# Patient Record
Sex: Female | Born: 1945 | Race: White | Hispanic: No | Smoking: Former smoker
Health system: Southern US, Community
[De-identification: ages and names within clinical notes are randomized; demographics above are authoritative.]

## PROBLEM LIST (undated history)

## (undated) DIAGNOSIS — G4730 Sleep apnea, unspecified: Secondary | ICD-10-CM

## (undated) DIAGNOSIS — F410 Panic disorder [episodic paroxysmal anxiety] without agoraphobia: Secondary | ICD-10-CM

## (undated) DIAGNOSIS — J449 Chronic obstructive pulmonary disease, unspecified: Secondary | ICD-10-CM

## (undated) DIAGNOSIS — R0602 Shortness of breath: Secondary | ICD-10-CM

## (undated) DIAGNOSIS — Z7189 Other specified counseling: Secondary | ICD-10-CM

## (undated) DIAGNOSIS — C801 Malignant (primary) neoplasm, unspecified: Secondary | ICD-10-CM

## (undated) DIAGNOSIS — D509 Iron deficiency anemia, unspecified: Secondary | ICD-10-CM

## (undated) DIAGNOSIS — J439 Emphysema, unspecified: Secondary | ICD-10-CM

## (undated) DIAGNOSIS — J189 Pneumonia, unspecified organism: Secondary | ICD-10-CM

## (undated) DIAGNOSIS — I10 Essential (primary) hypertension: Secondary | ICD-10-CM

## (undated) HISTORY — DX: Essential (primary) hypertension: I10

## (undated) HISTORY — DX: Shortness of breath: R06.02

## (undated) HISTORY — PX: CATARACT EXTRACTION: SUR2

## (undated) HISTORY — DX: Pneumonia, unspecified organism: J18.9

## (undated) HISTORY — DX: Other specified counseling: Z71.89

## (undated) HISTORY — PX: WISDOM TOOTH EXTRACTION: SHX21

## (undated) HISTORY — DX: Iron deficiency anemia, unspecified: D50.9

---

## 2002-01-29 ENCOUNTER — Other Ambulatory Visit: Admission: RE | Admit: 2002-01-29 | Discharge: 2002-01-29 | Payer: Self-pay | Admitting: Obstetrics and Gynecology

## 2002-03-11 ENCOUNTER — Encounter: Payer: Self-pay | Admitting: Family Medicine

## 2002-03-11 ENCOUNTER — Encounter: Admission: RE | Admit: 2002-03-11 | Discharge: 2002-03-11 | Payer: Self-pay | Admitting: Family Medicine

## 2004-04-28 ENCOUNTER — Emergency Department (HOSPITAL_COMMUNITY): Admission: EM | Admit: 2004-04-28 | Discharge: 2004-04-28 | Payer: Self-pay | Admitting: Emergency Medicine

## 2010-12-12 ENCOUNTER — Emergency Department (HOSPITAL_COMMUNITY)
Admission: EM | Admit: 2010-12-12 | Discharge: 2010-12-12 | Disposition: A | Discharge status: Home / Self Care | Payer: BC Managed Care – PPO | Attending: Emergency Medicine | Admitting: Emergency Medicine

## 2010-12-12 DIAGNOSIS — S40269A Insect bite (nonvenomous) of unspecified shoulder, initial encounter: Secondary | ICD-10-CM | POA: Insufficient documentation

## 2010-12-12 DIAGNOSIS — I10 Essential (primary) hypertension: Secondary | ICD-10-CM | POA: Insufficient documentation

## 2010-12-12 DIAGNOSIS — W57XXXA Bitten or stung by nonvenomous insect and other nonvenomous arthropods, initial encounter: Secondary | ICD-10-CM | POA: Insufficient documentation

## 2010-12-12 DIAGNOSIS — F411 Generalized anxiety disorder: Secondary | ICD-10-CM | POA: Insufficient documentation

## 2011-10-15 ENCOUNTER — Other Ambulatory Visit (HOSPITAL_COMMUNITY): Payer: Self-pay | Admitting: Obstetrics and Gynecology

## 2011-10-15 DIAGNOSIS — Z1231 Encounter for screening mammogram for malignant neoplasm of breast: Secondary | ICD-10-CM

## 2011-11-12 ENCOUNTER — Ambulatory Visit (HOSPITAL_COMMUNITY)
Admission: RE | Admit: 2011-11-12 | Discharge: 2011-11-12 | Disposition: A | Discharge status: Home / Self Care | Payer: BC Managed Care – PPO | Source: Ambulatory Visit | Attending: Obstetrics and Gynecology | Admitting: Obstetrics and Gynecology

## 2011-11-12 DIAGNOSIS — Z1231 Encounter for screening mammogram for malignant neoplasm of breast: Secondary | ICD-10-CM | POA: Insufficient documentation

## 2012-04-07 ENCOUNTER — Encounter: Payer: Self-pay | Admitting: Internal Medicine

## 2012-04-07 ENCOUNTER — Ambulatory Visit (INDEPENDENT_AMBULATORY_CARE_PROVIDER_SITE_OTHER): Payer: BC Managed Care – PPO | Admitting: Internal Medicine

## 2012-04-07 VITALS — BP 130/80 | HR 85 | Temp 98.5°F | Ht 67.0 in | Wt 227.2 lb

## 2012-04-07 DIAGNOSIS — R0600 Dyspnea, unspecified: Secondary | ICD-10-CM

## 2012-04-07 DIAGNOSIS — R0989 Other specified symptoms and signs involving the circulatory and respiratory systems: Secondary | ICD-10-CM

## 2012-04-07 NOTE — Progress Notes (Signed)
Subjective:    Patient ID: Alisha Fisher, female    DOB: 1946-04-08, 66 y.o.   MRN: [id number]  HPI  PCP is [facility]. Body mass index is 35.58 kg/(m^2).  reports that she has been smoking Cigarettes.  She has a 20 pack-year smoking history. She does not have any smokeless tobacco history on file.  IOV 04/07/2012  66 year old female. Reports dyspnea. Insidious onset for over few years. PRogressive a year ago but improved recently but still not at baseline. Severity is moderate-severe. Not present at rest except during URI episodes. Worsened by exertion such as walking inside the house. Improved by advair since past month  Associated social stress + with daughter in law ill and she taking care of grand-twins. THere is associated mild cough or wheezing that could be made worse by pollen, dust, allergies. There is also episodic dyspnea that can happen at random while going to the car or toilet and she will need to cool her face over the fan. She wondered if this was anxiety but did not improve with anxity pill. However , she does have sense of doom during this time and palms get sweaty. No episode since advair past month.    Note: 50# weight gain x 12 months  Walking desaturation test 12/19/2011 185 feet x 2 laps: could not do more due to dyspnea., Lowest pulse ox 88%    Pmhx    - significant for pneumonia FEb 2013 - per hx right lung. REfused admit but Rx as opd. REports fu cxxr shpwed pna cleared up  LABS  CXR July 2005    - clear lung fields  Past Medical History  Diagnosis Date  . HTN (hypertension)   . Pneumonia   . SOB (shortness of breath)      Family History  Problem Relation Age of Onset  . Allergies    . COPD Cousin      History   Social History  . Marital Status: Widowed    Spouse Name: N/A    Number of Children: N/A  . Years of Education: N/A   Occupational History  . retired    Social History Main Topics  . Smoking status: Current Everyday  Smoker -- 0.5 packs/day for 40 years    Types: Cigarettes  . Smokeless tobacco: Not on file  . Alcohol Use: 0.5 oz/week    1 drink(s) per week  . Drug Use: No  . Sexually Active: Not on file   Other Topics Concern  . Not on file   Social History Narrative  . No narrative on file     No Known Allergies   No outpatient prescriptions prior to visit.        Review of Systems  Constitutional: Negative for fever and unexpected weight change.  HENT: Negative for ear pain, nosebleeds, congestion, sore throat, rhinorrhea, sneezing, trouble swallowing, dental problem, postnasal drip and sinus pressure.   Eyes: Negative for redness and itching.  Respiratory: Positive for shortness of breath. Negative for cough, chest tightness and wheezing.   Cardiovascular: Negative for palpitations and leg swelling.  Gastrointestinal: Negative for nausea and vomiting.  Genitourinary: Negative for dysuria.  Musculoskeletal: Negative for joint swelling.  Skin: Negative for rash.  Neurological: Positive for headaches.  Hematological: Does not bruise/bleed easily.  Psychiatric/Behavioral: Positive for dysphoric mood. The patient is not nervous/anxious.        Objective:   Physical Exam  Vitals reviewed. Constitutional: She is oriented to person,  place, and time. She appears well-developed and well-nourished. No distress.       Body mass index is 35.58 kg/(m^2).   HENT:  Head: Normocephalic and atraumatic.  Right Ear: External ear normal.  Left Ear: External ear normal.  Mouth/Throat: Oropharynx is clear and moist. No oropharyngeal exudate.  Eyes: Conjunctivae and EOM are normal. Pupils are equal, round, and reactive to light. Right eye exhibits no discharge. Left eye exhibits no discharge. No scleral icterus.  Neck: Normal range of motion. Neck supple. No JVD present. No tracheal deviation present. No thyromegaly present.  Cardiovascular: Normal rate, regular rhythm, normal heart sounds and  intact distal pulses.  Exam reveals no gallop and no friction rub.   No murmur heard. Pulmonary/Chest: Effort normal. No respiratory distress. She has wheezes. She has no rales. She exhibits no tenderness.       ? Forced expiratory wheeze +  Abdominal: Soft. Bowel sounds are normal. She exhibits no distension and no mass. There is no tenderness. There is no rebound and no guarding.  Musculoskeletal: Normal range of motion. She exhibits no edema and no tenderness.  Lymphadenopathy:    She has no cervical adenopathy.  Neurological: She is alert and oriented to person, place, and time. She has normal reflexes. No cranial nerve deficit. She exhibits normal muscle tone. Coordination normal.  Skin: Skin is warm and dry. No rash noted. She is not diaphoretic. No erythema. No pallor.  Psychiatric: She has a normal mood and affect. Her behavior is normal. Judgment and thought content normal.          Assessment & Plan:

## 2012-04-07 NOTE — Patient Instructions (Addendum)
Please have breathing test called PFT at [place] office Will set up overnight oxygen study to look at oxygen levels more carefully and if potential problem heree is related to shortness of breath I will look at result and call you with next step of advice

## 2012-04-11 ENCOUNTER — Encounter: Payer: Self-pay | Admitting: Internal Medicine

## 2012-04-11 DIAGNOSIS — R0600 Dyspnea, unspecified: Secondary | ICD-10-CM | POA: Insufficient documentation

## 2012-04-11 NOTE — Assessment & Plan Note (Signed)
Unclear cause of dyspnea  Plan Please have breathing test called PFT at [place] office Will set up overnight oxygen study to look at oxygen levels more carefully and if potential problem heree is related to shortness of breath I will look at result and call you with next step of advice

## 2012-04-22 ENCOUNTER — Ambulatory Visit (INDEPENDENT_AMBULATORY_CARE_PROVIDER_SITE_OTHER): Payer: BC Managed Care – PPO | Admitting: Internal Medicine

## 2012-04-22 DIAGNOSIS — R0600 Dyspnea, unspecified: Secondary | ICD-10-CM

## 2012-04-22 DIAGNOSIS — R0989 Other specified symptoms and signs involving the circulatory and respiratory systems: Secondary | ICD-10-CM

## 2012-04-22 LAB — PULMONARY FUNCTION TEST

## 2012-04-22 NOTE — Progress Notes (Signed)
PFT done today. 

## 2012-05-06 ENCOUNTER — Telehealth: Payer: Self-pay | Admitting: Internal Medicine

## 2012-05-06 DIAGNOSIS — R942 Abnormal results of pulmonary function studies: Secondary | ICD-10-CM

## 2012-05-06 DIAGNOSIS — R0600 Dyspnea, unspecified: Secondary | ICD-10-CM

## 2012-05-06 DIAGNOSIS — R0902 Hypoxemia: Secondary | ICD-10-CM

## 2012-05-06 NOTE — Telephone Encounter (Signed)
RAMASWAMY,MURALI, MD 05/06/2012 1:53 AM Signed  PFT 04/22/12 show mixed obstruction and restriction. I have ordered CT chest wo contrast. Fu depending on those results. Please let her know  Thanks  MR   I spoke with patient about results and she verbalized understanding and had no questions

## 2012-05-06 NOTE — Telephone Encounter (Signed)
Pt aware. Jennea Rager, CMA  

## 2012-05-06 NOTE — Telephone Encounter (Signed)
PFT 04/22/12 show mixed obstruction and restriction. I have ordered CT chest wo contrast. Fu depending on those results. Please let her know   Thanks MR  For my use  - fev1 1.1L/47%, rato 60, 8% BD response - c/w obstrn -  TLC 4L/75% - cw restriction  DLCO 16/63%

## 2012-05-08 ENCOUNTER — Encounter: Payer: Self-pay | Admitting: Internal Medicine

## 2012-05-08 ENCOUNTER — Telehealth: Payer: Self-pay | Admitting: Internal Medicine

## 2012-05-08 ENCOUNTER — Ambulatory Visit (INDEPENDENT_AMBULATORY_CARE_PROVIDER_SITE_OTHER)
Admission: RE | Admit: 2012-05-08 | Discharge: 2012-05-08 | Disposition: A | Discharge status: Home / Self Care | Payer: BC Managed Care – PPO | Source: Ambulatory Visit | Attending: Internal Medicine | Admitting: Internal Medicine

## 2012-05-08 DIAGNOSIS — R0902 Hypoxemia: Secondary | ICD-10-CM

## 2012-05-08 DIAGNOSIS — R942 Abnormal results of pulmonary function studies: Secondary | ICD-10-CM

## 2012-05-08 DIAGNOSIS — R0609 Other forms of dyspnea: Secondary | ICD-10-CM

## 2012-05-08 DIAGNOSIS — R0600 Dyspnea, unspecified: Secondary | ICD-10-CM

## 2012-05-08 NOTE — Telephone Encounter (Signed)
Jen  CT shows emphysema as cause of dyspnea. Pleaes have her come in to discuss. PRefer I see her next few weeks  MR

## 2012-05-09 NOTE — Telephone Encounter (Signed)
[id number]. Can offer the pt appt on Friday 05-23-12 at 1:30pm, ok to double. Carron Curie, CMA

## 2012-05-09 NOTE — Telephone Encounter (Signed)
Will forward to [facility] since MR has nothing available. Pt has not been giving results yet

## 2012-05-09 NOTE — Telephone Encounter (Signed)
Spoke with pt and advised of results. Appt set for 05-21-12 at 1:30. Pt was very anxious about the results and requested that MR call her to discuss. She states she will keep her appt but does not want to worry about the results for the next 2 weeks. I advised her I will send message to MR. Carron Curie, CMA

## 2012-05-21 ENCOUNTER — Ambulatory Visit (INDEPENDENT_AMBULATORY_CARE_PROVIDER_SITE_OTHER): Payer: BC Managed Care – PPO | Admitting: Internal Medicine

## 2012-05-21 ENCOUNTER — Encounter: Payer: Self-pay | Admitting: Internal Medicine

## 2012-05-21 VITALS — BP 140/82 | HR 106 | Temp 98.0°F | Ht 67.0 in | Wt 228.6 lb

## 2012-05-21 DIAGNOSIS — J449 Chronic obstructive pulmonary disease, unspecified: Secondary | ICD-10-CM

## 2012-05-21 DIAGNOSIS — R0609 Other forms of dyspnea: Secondary | ICD-10-CM

## 2012-05-21 DIAGNOSIS — R0600 Dyspnea, unspecified: Secondary | ICD-10-CM

## 2012-05-21 DIAGNOSIS — F17200 Nicotine dependence, unspecified, uncomplicated: Secondary | ICD-10-CM

## 2012-05-21 DIAGNOSIS — E669 Obesity, unspecified: Secondary | ICD-10-CM

## 2012-05-21 MED ORDER — TIOTROPIUM BROMIDE MONOHYDRATE 18 MCG IN CAPS: 18.0000 ug | ORAL_CAPSULE | Freq: Every day | RESPIRATORY_TRACT | Status: DC

## 2012-05-21 NOTE — Patient Instructions (Addendum)
#  COPD   - Please start spiriva 1 puff daily - take sample, script and show technique - if spiriva not working well for you call us soon, we can re-start advair. For now, hold off advair - take pro-air as needed  - I have set you up for overnight oxygen study  - CMA will do handicap sticker for you -at future date when social situation improves, will discuss rehab exercise program for you  - at future date will chck alpha 1 genetic cause of copd - have flu shot in fall  #Smoking  you got to quit   We disussed medications, when you are ready let me know   #weight  - this is definitely contributing to shortness of breath - discuss with primary care physician about weight loss - some point will discuss low glycemic diet with you  #Followup  - 3 montsh with CAT score at followup

## 2012-05-21 NOTE — Progress Notes (Signed)
Subjective:    Patient ID: Alisha Fisher, female    DOB: 01-24-1946, 66 y.o.   MRN: [id number]  HPI PCP is [facility]. Body mass index is 35.58 kg/(m^2).  reports that she has been smoking Cigarettes.  She has a 20 pack-year smoking history.  IOV 04/07/2012  66 year old female. Reports dyspnea. Insidious onset for over few years. PRogressive a year ago but improved recently but still not at baseline. Severity is moderate-severe. Not present at rest except during URI episodes. Worsened by exertion such as walking inside the house. Improved by advair since past month  Associated social stress + with daughter in law ill and she taking care of grand-twins. THere is associated mild cough or wheezing that could be made worse by pollen, dust, allergies. There is also episodic dyspnea that can happen at random while going to the car or toilet and she will need to cool her face over the fan. She wondered if this was anxiety but did not improve with anxity pill. However , she does have sense of doom during this time and palms get sweaty. No episode since advair past month.    Note: 50# weight gain x 12 months  Walking desaturation test 12/19/2011 185 feet x 2 laps: could not do more due to dyspnea., Lowest pulse ox 88%    Pmhx    - significant for pneumonia FEb 2013 - per hx right lung. REfused admit but Rx as opd. REports fu cxxr shpwed pna cleared up  LABS  CXR July 2005    - clear lung fields  REC Please have breathing test called PFT at [place] office  Will set up overnight oxygen study to look at oxygen levels more carefully and if potential problem heree is related to shortness of breath  I will look at result and call you with next step of advice  .OV 05/21/2012 Smoker. Dyspnea fu  Here to discuss  results as part of dyspnea workp  PFT 04/22/12:  show mixed obstruction and restriction. FEv1 1.1/47%, Rqtio 60%, TLC 75%, DLCO 63%  CT chest 05/08/12   1. Emphysema. - (per me:  seems more upper lobe) 2. Small right pleural effusion  Original Report Authenticated By: Rosealee Albee, M.D.    Smoking -  She says she is not inhaling cigs as much as any more but admits not quitting completely.  She is thinking of trying e cigs. Zyban apparently caused neck closing. Never tried chantix but chantix could be problematic: has anxiety, and firearms in the house. Admits to lots of social stress - with daughter in law with lymphoma and seriously ill and therefore environment not conducive to quitting smoking  Dicussed dyspnea: still persists unchanged though advair seems to help. Fev1 is 47% predicted. Weight is a big issue with recent weight gain. Discussed weight: Does not follow special diet. 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)  Current outpatient prescriptions:ADVAIR DISKUS 250-50 MCG/DOSE AEPB, Inhale 1 puff into the lungs Twice daily., Disp: , Rfl: ;  amLODipine (NORVASC) 5 MG tablet, Take 5 mg by mouth daily., Disp: , Rfl: ;  ferrous sulfate 325 (65 FE) MG EC tablet, Take 325 mg by mouth daily with breakfast., Disp: , Rfl: ;  losartan-hydrochlorothiazide (HYZAAR) 100-25 MG per tablet, Take 1 tablet by mouth daily., Disp: , Rfl:  metoprolol (LOPRESSOR) 100 MG tablet, Take 150 mg by mouth 2 (two) times daily., Disp: ,  Rfl: ;  PROAIR HFA 108 (90 BASE) MCG/ACT inhaler, Inhale 2 puffs into the lungs Every 6 hours as needed., Disp: , Rfl:    Past, Family, Social reviewed: no change since last visit   Review of Systems  Constitutional: Negative for fever and unexpected weight change.  HENT: Positive for congestion. Negative for ear pain, nosebleeds, sore throat, rhinorrhea, sneezing, trouble swallowing, dental problem, postnasal drip and sinus pressure.   Eyes: Negative for redness and itching.  Respiratory: Positive for shortness of breath. Negative for cough, chest tightness and wheezing.   Cardiovascular: Negative for  palpitations and leg swelling.  Gastrointestinal: Negative for nausea and vomiting.  Genitourinary: Negative for dysuria.  Musculoskeletal: Negative for joint swelling.  Skin: Negative for rash.  Neurological: Negative for headaches.  Hematological: Does not bruise/bleed easily.  Psychiatric/Behavioral: Negative for dysphoric mood. The patient is not nervous/anxious.        Objective:   Physical Exam Vitals reviewed. Constitutional: She is oriented to person, place, and time. She appears well-developed and well-nourished. No distress.       Body mass index is 35.58 kg/(m^2).   HENT:  Head: Normocephalic and atraumatic.  Right Ear: External ear normal.  Left Ear: External ear normal.  Mouth/Throat: Oropharynx is clear and moist. No oropharyngeal exudate.  Eyes: Conjunctivae and EOM are normal. Pupils are equal, round, and reactive to light. Right eye exhibits no discharge. Left eye exhibits no discharge. No scleral icterus.  Neck: Normal range of motion. Neck supple. No JVD present. No tracheal deviation present. No thyromegaly present.  Cardiovascular: Normal rate, regular rhythm, normal heart sounds and intact distal pulses.  Exam reveals no gallop and no friction rub.   No murmur heard. Pulmonary/Chest: Effort normal. No respiratory distress. She has wheezes. She has no rales. She exhibits no tenderness.  Abdominal: Soft. Bowel sounds are normal. She exhibits no distension and no mass. There is no tenderness. There is no rebound and no guarding.  Musculoskeletal: Normal range of motion. She exhibits no edema and no tenderness.  Lymphadenopathy:    She has no cervical adenopathy.  Neurological: She is alert and oriented to person, place, and time. She has normal reflexes. No cranial nerve deficit. She exhibits normal muscle tone. Coordination normal.  Skin: Skin is warm and dry. No rash noted. She is not diaphoretic. No erythema. No pallor.  Psychiatric: She has a normal mood and  affect. Her behavior is normal. Judgment and thought content normal.         Assessment & Plan:

## 2012-05-25 DIAGNOSIS — E669 Obesity, unspecified: Secondary | ICD-10-CM | POA: Insufficient documentation

## 2012-05-25 DIAGNOSIS — F17200 Nicotine dependence, unspecified, uncomplicated: Secondary | ICD-10-CM | POA: Insufficient documentation

## 2012-05-25 DIAGNOSIS — J449 Chronic obstructive pulmonary disease, unspecified: Secondary | ICD-10-CM | POA: Insufficient documentation

## 2012-05-25 NOTE — Assessment & Plan Note (Signed)
#  Smoking  you got to quit   We disussed medications, when you are ready let me know  (see hpi for discussion details)  4 min face to face counseling  #Followup  - 3 montsh with CAT score at followup

## 2012-05-25 NOTE — Assessment & Plan Note (Signed)
#  COPD   - Please start spiriva 1 puff daily - take sample, script and show technique - if spiriva not working well for you call us soon, we can re-start advair. For now, hold off advair - take pro-air as needed  - I have set you up for overnight oxygen study  - CMA will do handicap sticker for you -at future date when social situation improves, will discuss rehab exercise program for you  - at future date will chck alpha 1 genetic cause of copd - have flu shot in fall

## 2012-05-25 NOTE — Assessment & Plan Note (Signed)
#  weight  - this is definitely contributing to shortness of breath - discuss with primary care physician about weight loss - at some point, I can discuss low glyucemic diet with her

## 2012-06-12 ENCOUNTER — Telehealth: Payer: Self-pay | Admitting: Internal Medicine

## 2012-06-12 DIAGNOSIS — J449 Chronic obstructive pulmonary disease, unspecified: Secondary | ICD-10-CM

## 2012-06-12 NOTE — Telephone Encounter (Signed)
Reviewed ONO done  05/28/12. Based on this (details below) and walking desat in office to pulse ox 88%, she qualifies for oxygen 1L with exertion and sleep. If she is interested, please set this up  Thanks  MR  ONO 05/28/12 results for my perusal   Lowest pulse ox 69%  desaturrated more than 5% of aseline of 91$ for total 29 minutes  longest period oulse ox < 88% was 5 mnutes Total period < 88% was 48 minutes which is 9.5% of sleep time

## 2012-06-17 NOTE — Telephone Encounter (Signed)
I spoke with the pt and she is ok to start oxygen with sleep and exertion. Pt states she uses [employer]. Order placed.Carron Curie, CMA

## 2012-06-26 ENCOUNTER — Encounter: Payer: Self-pay | Admitting: Internal Medicine

## 2012-07-30 ENCOUNTER — Telehealth: Payer: Self-pay | Admitting: Internal Medicine

## 2012-07-30 NOTE — Telephone Encounter (Signed)
Called pt on 07/30/12 to make next appt per recall.  Pt stated she did not wish to make this appt at this time as she has a lot going on in her family.  I advised her that she needs to see MR at least once a year and she stated her understanding.  She stated she will call us at a later date to make the appt. Leanora Ivanoff

## 2013-07-07 ENCOUNTER — Ambulatory Visit (INDEPENDENT_AMBULATORY_CARE_PROVIDER_SITE_OTHER): Payer: Medicare Other | Admitting: Interventional Cardiology

## 2013-07-07 ENCOUNTER — Encounter: Payer: Self-pay | Admitting: Interventional Cardiology

## 2013-07-07 VITALS — BP 142/78 | HR 92 | Ht 67.0 in | Wt 265.0 lb

## 2013-07-07 DIAGNOSIS — R0600 Dyspnea, unspecified: Secondary | ICD-10-CM

## 2013-07-07 DIAGNOSIS — R0989 Other specified symptoms and signs involving the circulatory and respiratory systems: Secondary | ICD-10-CM

## 2013-07-07 DIAGNOSIS — J4489 Other specified chronic obstructive pulmonary disease: Secondary | ICD-10-CM

## 2013-07-07 DIAGNOSIS — R0609 Other forms of dyspnea: Secondary | ICD-10-CM

## 2013-07-07 DIAGNOSIS — E669 Obesity, unspecified: Secondary | ICD-10-CM

## 2013-07-07 DIAGNOSIS — I5032 Chronic diastolic (congestive) heart failure: Secondary | ICD-10-CM

## 2013-07-07 DIAGNOSIS — J449 Chronic obstructive pulmonary disease, unspecified: Secondary | ICD-10-CM

## 2013-07-07 NOTE — Progress Notes (Signed)
Patient ID: Alisha Fisher, female   DOB: May 31, 1946, 67 y.o.   MRN: [id number]    HPI  Dyspnea is unchanged from the last office visit. She denies chest pain. There is no peripheral edema. She's had no syncope, palpitations, orthopnea, or PND. Exertional tolerance is unchanged. No Known Allergies  Current Outpatient Prescriptions  Medication Sig Dispense Refill  . ADVAIR DISKUS 250-50 MCG/DOSE AEPB Inhale 1 puff into the lungs Twice daily.      Marland Kitchen amLODipine (NORVASC) 5 MG tablet Take 5 mg by mouth daily.      . clonazePAM (KLONOPIN) 0.5 MG tablet Take 0.5 mg by mouth 2 (two) times daily as needed for anxiety.      . ferrous sulfate 325 (65 FE) MG EC tablet Take 325 mg by mouth daily with breakfast.      . Ipratropium-Albuterol (COMBIVENT IN) Inhale into the lungs as needed.      Marland Kitchen losartan-hydrochlorothiazide (HYZAAR) 100-25 MG per tablet Take 1 tablet by mouth daily.      . metoprolol (LOPRESSOR) 100 MG tablet 1 1/2 tab po qd      . PROAIR HFA 108 (90 BASE) MCG/ACT inhaler Inhale 2 puffs into the lungs Every 6 hours as needed.       No current facility-administered medications for this visit.    Past Medical History  Diagnosis Date  . HTN (hypertension)   . Pneumonia   . SOB (shortness of breath)     Past Surgical History  Procedure Laterality Date  . No past surgeries      ROS: Multiple complaints, including headache, arthritis, in the knees, weight gain, cough, allergies, and anxiety/stress  PHYSICAL EXAM BP 142/78  Pulse 92  Ht 5\' 7"  (1.702 m)  Wt 265 lb (120.203 kg)  BMI 41.5 kg/m2 Smells of cigarette smoke, still Expiratory wheezes and rhonchi No murmur, rub, click, or gout Trace bilateral edema  EKG: Normal sinus rhythm with nonspecific ST abnormality  ASSESSMENT AND PLAN  1. Chronic dyspnea, multifactorial, due to COPD and diastolic heart failure  2. Diastolic left ventricular dysfunction, resulting in chronic diastolic heart failure, stable.   No change in  therapy is needed at this time  3. COPD. , Recommended smoking cessation  4. Obesity. Encouraged caloric restriction and aerobic exercise  5. Tobacco abuse. Encouraged smoking cessation

## 2013-07-07 NOTE — Patient Instructions (Addendum)
Your physician recommends that you continue on your current medications as directed. Please refer to the Current Medication list given to you today.  Your physician recommends that you schedule a follow-up appointment in: 1 YEAR WITH DR.SMITH  

## 2013-07-14 ENCOUNTER — Telehealth: Payer: Self-pay | Admitting: Interventional Cardiology

## 2013-07-14 MED ORDER — METOPROLOL TARTRATE 100 MG PO TABS: 150.0000 mg | ORAL_TABLET | Freq: Every day | ORAL | Status: DC

## 2013-07-14 MED ORDER — AMLODIPINE BESYLATE 5 MG PO TABS: 5.0000 mg | ORAL_TABLET | Freq: Every day | ORAL | Status: DC

## 2013-07-14 NOTE — Telephone Encounter (Signed)
pt adv. amlodopine and metoprolol refilled and sent to [facility]

## 2013-07-14 NOTE — Telephone Encounter (Signed)
New message   Saw dr Katrinka Blazing on 10-7---- [facility] does not have presc--pls call pt asap she is running out of rx

## 2013-08-04 ENCOUNTER — Encounter: Payer: Self-pay | Admitting: Interventional Cardiology

## 2014-05-04 ENCOUNTER — Other Ambulatory Visit: Payer: Self-pay | Admitting: *Deleted

## 2014-05-04 MED ORDER — LOSARTAN POTASSIUM-HCTZ 100-25 MG PO TABS: 1.0000 | ORAL_TABLET | Freq: Every day | ORAL | Status: DC

## 2014-07-09 ENCOUNTER — Ambulatory Visit: Payer: Medicare Other | Admitting: Interventional Cardiology

## 2014-07-12 ENCOUNTER — Encounter: Payer: Self-pay | Admitting: Interventional Cardiology

## 2014-08-19 ENCOUNTER — Encounter: Payer: Self-pay | Admitting: Interventional Cardiology

## 2014-11-29 ENCOUNTER — Other Ambulatory Visit (HOSPITAL_COMMUNITY): Payer: Self-pay | Admitting: *Deleted

## 2014-11-29 DIAGNOSIS — Z1231 Encounter for screening mammogram for malignant neoplasm of breast: Secondary | ICD-10-CM

## 2014-12-07 ENCOUNTER — Ambulatory Visit (HOSPITAL_COMMUNITY)
Admission: RE | Admit: 2014-12-07 | Discharge: 2014-12-07 | Disposition: A | Discharge status: Home / Self Care | Payer: Medicare Other | Source: Ambulatory Visit | Attending: *Deleted | Admitting: *Deleted

## 2014-12-07 DIAGNOSIS — Z1231 Encounter for screening mammogram for malignant neoplasm of breast: Secondary | ICD-10-CM | POA: Insufficient documentation

## 2016-11-14 DIAGNOSIS — J189 Pneumonia, unspecified organism: Secondary | ICD-10-CM | POA: Diagnosis not present

## 2016-11-14 DIAGNOSIS — J449 Chronic obstructive pulmonary disease, unspecified: Secondary | ICD-10-CM | POA: Diagnosis not present

## 2016-11-14 DIAGNOSIS — I10 Essential (primary) hypertension: Secondary | ICD-10-CM

## 2016-11-14 DIAGNOSIS — J9601 Acute respiratory failure with hypoxia: Secondary | ICD-10-CM

## 2016-11-14 DIAGNOSIS — R918 Other nonspecific abnormal finding of lung field: Secondary | ICD-10-CM

## 2016-11-15 DIAGNOSIS — J189 Pneumonia, unspecified organism: Secondary | ICD-10-CM | POA: Diagnosis not present

## 2016-11-15 DIAGNOSIS — J9601 Acute respiratory failure with hypoxia: Secondary | ICD-10-CM | POA: Diagnosis not present

## 2016-11-15 DIAGNOSIS — I10 Essential (primary) hypertension: Secondary | ICD-10-CM | POA: Diagnosis not present

## 2016-11-15 DIAGNOSIS — R918 Other nonspecific abnormal finding of lung field: Secondary | ICD-10-CM | POA: Diagnosis not present

## 2016-11-15 DIAGNOSIS — J449 Chronic obstructive pulmonary disease, unspecified: Secondary | ICD-10-CM | POA: Diagnosis not present

## 2016-11-16 DIAGNOSIS — J9601 Acute respiratory failure with hypoxia: Secondary | ICD-10-CM | POA: Diagnosis not present

## 2016-11-16 DIAGNOSIS — I10 Essential (primary) hypertension: Secondary | ICD-10-CM | POA: Diagnosis not present

## 2016-11-16 DIAGNOSIS — R918 Other nonspecific abnormal finding of lung field: Secondary | ICD-10-CM | POA: Diagnosis not present

## 2016-11-16 DIAGNOSIS — J189 Pneumonia, unspecified organism: Secondary | ICD-10-CM | POA: Diagnosis not present

## 2016-11-17 DIAGNOSIS — J189 Pneumonia, unspecified organism: Secondary | ICD-10-CM | POA: Diagnosis not present

## 2016-11-17 DIAGNOSIS — J9601 Acute respiratory failure with hypoxia: Secondary | ICD-10-CM | POA: Diagnosis not present

## 2016-11-17 DIAGNOSIS — I10 Essential (primary) hypertension: Secondary | ICD-10-CM | POA: Diagnosis not present

## 2016-11-17 DIAGNOSIS — R918 Other nonspecific abnormal finding of lung field: Secondary | ICD-10-CM | POA: Diagnosis not present

## 2016-11-18 DIAGNOSIS — I10 Essential (primary) hypertension: Secondary | ICD-10-CM | POA: Diagnosis not present

## 2016-11-18 DIAGNOSIS — J189 Pneumonia, unspecified organism: Secondary | ICD-10-CM | POA: Diagnosis not present

## 2016-11-18 DIAGNOSIS — J9601 Acute respiratory failure with hypoxia: Secondary | ICD-10-CM | POA: Diagnosis not present

## 2016-11-18 DIAGNOSIS — R918 Other nonspecific abnormal finding of lung field: Secondary | ICD-10-CM | POA: Diagnosis not present

## 2016-11-19 DIAGNOSIS — R918 Other nonspecific abnormal finding of lung field: Secondary | ICD-10-CM | POA: Diagnosis not present

## 2016-11-19 DIAGNOSIS — J189 Pneumonia, unspecified organism: Secondary | ICD-10-CM | POA: Diagnosis not present

## 2016-11-19 DIAGNOSIS — J9601 Acute respiratory failure with hypoxia: Secondary | ICD-10-CM | POA: Diagnosis not present

## 2016-11-19 DIAGNOSIS — I10 Essential (primary) hypertension: Secondary | ICD-10-CM | POA: Diagnosis not present

## 2016-11-19 DIAGNOSIS — J449 Chronic obstructive pulmonary disease, unspecified: Secondary | ICD-10-CM | POA: Diagnosis not present

## 2016-11-20 DIAGNOSIS — R918 Other nonspecific abnormal finding of lung field: Secondary | ICD-10-CM | POA: Diagnosis not present

## 2016-11-20 DIAGNOSIS — I10 Essential (primary) hypertension: Secondary | ICD-10-CM | POA: Diagnosis not present

## 2016-11-20 DIAGNOSIS — J449 Chronic obstructive pulmonary disease, unspecified: Secondary | ICD-10-CM | POA: Diagnosis not present

## 2016-11-20 DIAGNOSIS — J9601 Acute respiratory failure with hypoxia: Secondary | ICD-10-CM | POA: Diagnosis not present

## 2016-11-20 DIAGNOSIS — J189 Pneumonia, unspecified organism: Secondary | ICD-10-CM | POA: Diagnosis not present

## 2016-12-03 ENCOUNTER — Inpatient Hospital Stay (HOSPITAL_COMMUNITY)
Admission: EM | Admit: 2016-12-03 | Discharge: 2016-12-26 | DRG: 987 | Disposition: A | Discharge status: Skilled Nursing Facility | Payer: Medicare Other | Attending: Surgery | Admitting: Surgery

## 2016-12-03 ENCOUNTER — Emergency Department (HOSPITAL_COMMUNITY): Payer: Medicare Other

## 2016-12-03 ENCOUNTER — Encounter (HOSPITAL_COMMUNITY): Payer: Self-pay | Admitting: Emergency Medicine

## 2016-12-03 DIAGNOSIS — M549 Dorsalgia, unspecified: Secondary | ICD-10-CM | POA: Diagnosis present

## 2016-12-03 DIAGNOSIS — F329 Major depressive disorder, single episode, unspecified: Secondary | ICD-10-CM | POA: Diagnosis present

## 2016-12-03 DIAGNOSIS — B952 Enterococcus as the cause of diseases classified elsewhere: Secondary | ICD-10-CM | POA: Diagnosis present

## 2016-12-03 DIAGNOSIS — E669 Obesity, unspecified: Secondary | ICD-10-CM | POA: Diagnosis present

## 2016-12-03 DIAGNOSIS — L270 Generalized skin eruption due to drugs and medicaments taken internally: Secondary | ICD-10-CM | POA: Diagnosis not present

## 2016-12-03 DIAGNOSIS — G9340 Encephalopathy, unspecified: Secondary | ICD-10-CM | POA: Diagnosis not present

## 2016-12-03 DIAGNOSIS — I509 Heart failure, unspecified: Secondary | ICD-10-CM | POA: Diagnosis not present

## 2016-12-03 DIAGNOSIS — F17200 Nicotine dependence, unspecified, uncomplicated: Secondary | ICD-10-CM | POA: Diagnosis not present

## 2016-12-03 DIAGNOSIS — K352 Acute appendicitis with generalized peritonitis: Secondary | ICD-10-CM | POA: Diagnosis not present

## 2016-12-03 DIAGNOSIS — I10 Essential (primary) hypertension: Secondary | ICD-10-CM

## 2016-12-03 DIAGNOSIS — F419 Anxiety disorder, unspecified: Secondary | ICD-10-CM | POA: Diagnosis present

## 2016-12-03 DIAGNOSIS — K353 Acute appendicitis with localized peritonitis: Secondary | ICD-10-CM | POA: Diagnosis present

## 2016-12-03 DIAGNOSIS — R918 Other nonspecific abnormal finding of lung field: Secondary | ICD-10-CM | POA: Diagnosis not present

## 2016-12-03 DIAGNOSIS — J9621 Acute and chronic respiratory failure with hypoxia: Secondary | ICD-10-CM | POA: Diagnosis not present

## 2016-12-03 DIAGNOSIS — I5033 Acute on chronic diastolic (congestive) heart failure: Secondary | ICD-10-CM | POA: Diagnosis present

## 2016-12-03 DIAGNOSIS — D638 Anemia in other chronic diseases classified elsewhere: Secondary | ICD-10-CM | POA: Diagnosis present

## 2016-12-03 DIAGNOSIS — E46 Unspecified protein-calorie malnutrition: Secondary | ICD-10-CM | POA: Diagnosis present

## 2016-12-03 DIAGNOSIS — R109 Unspecified abdominal pain: Secondary | ICD-10-CM | POA: Diagnosis not present

## 2016-12-03 DIAGNOSIS — Z87891 Personal history of nicotine dependence: Secondary | ICD-10-CM

## 2016-12-03 DIAGNOSIS — G8929 Other chronic pain: Secondary | ICD-10-CM | POA: Diagnosis present

## 2016-12-03 DIAGNOSIS — R1031 Right lower quadrant pain: Secondary | ICD-10-CM | POA: Diagnosis present

## 2016-12-03 DIAGNOSIS — E876 Hypokalemia: Secondary | ICD-10-CM

## 2016-12-03 DIAGNOSIS — J9611 Chronic respiratory failure with hypoxia: Secondary | ICD-10-CM

## 2016-12-03 DIAGNOSIS — R0603 Acute respiratory distress: Secondary | ICD-10-CM

## 2016-12-03 DIAGNOSIS — Z803 Family history of malignant neoplasm of breast: Secondary | ICD-10-CM

## 2016-12-03 DIAGNOSIS — R188 Other ascites: Secondary | ICD-10-CM | POA: Diagnosis not present

## 2016-12-03 DIAGNOSIS — L988 Other specified disorders of the skin and subcutaneous tissue: Secondary | ICD-10-CM | POA: Diagnosis not present

## 2016-12-03 DIAGNOSIS — T373X5A Adverse effect of other antiprotozoal drugs, initial encounter: Secondary | ICD-10-CM | POA: Diagnosis not present

## 2016-12-03 DIAGNOSIS — R21 Rash and other nonspecific skin eruption: Secondary | ICD-10-CM

## 2016-12-03 DIAGNOSIS — Z6841 Body Mass Index (BMI) 40.0 and over, adult: Secondary | ICD-10-CM

## 2016-12-03 DIAGNOSIS — D649 Anemia, unspecified: Secondary | ICD-10-CM | POA: Diagnosis not present

## 2016-12-03 DIAGNOSIS — R591 Generalized enlarged lymph nodes: Secondary | ICD-10-CM

## 2016-12-03 DIAGNOSIS — Z7951 Long term (current) use of inhaled steroids: Secondary | ICD-10-CM | POA: Diagnosis not present

## 2016-12-03 DIAGNOSIS — G4733 Obstructive sleep apnea (adult) (pediatric): Secondary | ICD-10-CM | POA: Diagnosis present

## 2016-12-03 DIAGNOSIS — T368X5A Adverse effect of other systemic antibiotics, initial encounter: Secondary | ICD-10-CM | POA: Diagnosis not present

## 2016-12-03 DIAGNOSIS — C3431 Malignant neoplasm of lower lobe, right bronchus or lung: Secondary | ICD-10-CM | POA: Diagnosis present

## 2016-12-03 DIAGNOSIS — C3491 Malignant neoplasm of unspecified part of right bronchus or lung: Secondary | ICD-10-CM | POA: Diagnosis not present

## 2016-12-03 DIAGNOSIS — M79652 Pain in left thigh: Secondary | ICD-10-CM | POA: Diagnosis not present

## 2016-12-03 DIAGNOSIS — R222 Localized swelling, mass and lump, trunk: Secondary | ICD-10-CM | POA: Diagnosis not present

## 2016-12-03 DIAGNOSIS — M7989 Other specified soft tissue disorders: Secondary | ICD-10-CM | POA: Diagnosis not present

## 2016-12-03 DIAGNOSIS — K7460 Unspecified cirrhosis of liver: Secondary | ICD-10-CM | POA: Diagnosis present

## 2016-12-03 DIAGNOSIS — C3411 Malignant neoplasm of upper lobe, right bronchus or lung: Secondary | ICD-10-CM | POA: Diagnosis present

## 2016-12-03 DIAGNOSIS — K3532 Acute appendicitis with perforation and localized peritonitis, without abscess: Secondary | ICD-10-CM

## 2016-12-03 DIAGNOSIS — J90 Pleural effusion, not elsewhere classified: Secondary | ICD-10-CM | POA: Diagnosis not present

## 2016-12-03 DIAGNOSIS — Z9981 Dependence on supplemental oxygen: Secondary | ICD-10-CM

## 2016-12-03 DIAGNOSIS — K3533 Acute appendicitis with perforation and localized peritonitis, with abscess: Secondary | ICD-10-CM

## 2016-12-03 DIAGNOSIS — G4730 Sleep apnea, unspecified: Secondary | ICD-10-CM | POA: Diagnosis not present

## 2016-12-03 DIAGNOSIS — C3490 Malignant neoplasm of unspecified part of unspecified bronchus or lung: Secondary | ICD-10-CM | POA: Diagnosis not present

## 2016-12-03 DIAGNOSIS — Z825 Family history of asthma and other chronic lower respiratory diseases: Secondary | ICD-10-CM

## 2016-12-03 DIAGNOSIS — T360X5A Adverse effect of penicillins, initial encounter: Secondary | ICD-10-CM | POA: Diagnosis present

## 2016-12-03 DIAGNOSIS — J449 Chronic obstructive pulmonary disease, unspecified: Secondary | ICD-10-CM | POA: Diagnosis present

## 2016-12-03 DIAGNOSIS — I110 Hypertensive heart disease with heart failure: Secondary | ICD-10-CM | POA: Diagnosis present

## 2016-12-03 DIAGNOSIS — J9622 Acute and chronic respiratory failure with hypercapnia: Secondary | ICD-10-CM | POA: Diagnosis not present

## 2016-12-03 DIAGNOSIS — J9601 Acute respiratory failure with hypoxia: Secondary | ICD-10-CM | POA: Diagnosis not present

## 2016-12-03 DIAGNOSIS — Z9889 Other specified postprocedural states: Secondary | ICD-10-CM

## 2016-12-03 DIAGNOSIS — R0989 Other specified symptoms and signs involving the circulatory and respiratory systems: Secondary | ICD-10-CM | POA: Diagnosis not present

## 2016-12-03 DIAGNOSIS — Z833 Family history of diabetes mellitus: Secondary | ICD-10-CM

## 2016-12-03 DIAGNOSIS — K651 Peritoneal abscess: Secondary | ICD-10-CM | POA: Diagnosis not present

## 2016-12-03 DIAGNOSIS — J441 Chronic obstructive pulmonary disease with (acute) exacerbation: Secondary | ICD-10-CM | POA: Diagnosis not present

## 2016-12-03 DIAGNOSIS — C7800 Secondary malignant neoplasm of unspecified lung: Secondary | ICD-10-CM | POA: Diagnosis not present

## 2016-12-03 DIAGNOSIS — M79609 Pain in unspecified limb: Secondary | ICD-10-CM | POA: Diagnosis not present

## 2016-12-03 DIAGNOSIS — R7981 Abnormal blood-gas level: Secondary | ICD-10-CM

## 2016-12-03 DIAGNOSIS — Z8701 Personal history of pneumonia (recurrent): Secondary | ICD-10-CM

## 2016-12-03 DIAGNOSIS — R0602 Shortness of breath: Secondary | ICD-10-CM

## 2016-12-03 HISTORY — DX: Emphysema, unspecified: J43.9

## 2016-12-03 HISTORY — DX: Chronic obstructive pulmonary disease, unspecified: J44.9

## 2016-12-03 LAB — CBC
HCT: 39.5 % (ref 36.0–46.0)
Hemoglobin: 13.7 g/dL (ref 12.0–15.0)
MCH: 31.5 pg (ref 26.0–34.0)
MCHC: 34.7 g/dL (ref 30.0–36.0)
MCV: 90.8 fL (ref 78.0–100.0)
PLATELETS: 295 10*3/uL (ref 150–400)
RBC: 4.35 MIL/uL (ref 3.87–5.11)
RDW: 13.6 % (ref 11.5–15.5)
WBC: 22.3 10*3/uL — ABNORMAL HIGH (ref 4.0–10.5)

## 2016-12-03 LAB — COMPREHENSIVE METABOLIC PANEL WITH GFR
ALT: 13 U/L — ABNORMAL LOW (ref 14–54)
AST: 19 U/L (ref 15–41)
Albumin: 2.8 g/dL — ABNORMAL LOW (ref 3.5–5.0)
Alkaline Phosphatase: 98 U/L (ref 38–126)
Anion gap: 9 (ref 5–15)
BUN: 19 mg/dL (ref 6–20)
CO2: 31 mmol/L (ref 22–32)
Calcium: 9.1 mg/dL (ref 8.9–10.3)
Chloride: 93 mmol/L — ABNORMAL LOW (ref 101–111)
Creatinine, Ser: 0.92 mg/dL (ref 0.44–1.00)
GFR calc Af Amer: >60 mL/min
GFR calc non Af Amer: >60 mL/min
Glucose, Bld: 96 mg/dL (ref 65–99)
Potassium: 3.5 mmol/L (ref 3.5–5.1)
Sodium: 133 mmol/L — ABNORMAL LOW (ref 135–145)
Total Bilirubin: 0.7 mg/dL (ref 0.3–1.2)
Total Protein: 6.7 g/dL (ref 6.5–8.1)

## 2016-12-03 LAB — CBC WITH DIFFERENTIAL/PLATELET
Basophils Absolute: 0 10*3/uL (ref 0.0–0.1)
Basophils Relative: 0 %
Eosinophils Absolute: 0.1 10*3/uL (ref 0.0–0.7)
Eosinophils Relative: 0 %
HCT: 33 % — ABNORMAL LOW (ref 36.0–46.0)
Hemoglobin: 11.3 g/dL — ABNORMAL LOW (ref 12.0–15.0)
Lymphocytes Relative: 5 %
Lymphs Abs: 0.8 10*3/uL (ref 0.7–4.0)
MCH: 30.6 pg (ref 26.0–34.0)
MCHC: 34.2 g/dL (ref 30.0–36.0)
MCV: 89.4 fL (ref 78.0–100.0)
Monocytes Absolute: 0.6 10*3/uL (ref 0.1–1.0)
Monocytes Relative: 4 %
Neutro Abs: 14.7 10*3/uL — ABNORMAL HIGH (ref 1.7–7.7)
Neutrophils Relative %: 91 %
Platelets: 224 10*3/uL (ref 150–400)
RBC: 3.69 MIL/uL — ABNORMAL LOW (ref 3.87–5.11)
RDW: 13.5 % (ref 11.5–15.5)
WBC: 16.1 10*3/uL — ABNORMAL HIGH (ref 4.0–10.5)

## 2016-12-03 LAB — LIPASE, BLOOD: Lipase: 15 U/L (ref 11–51)

## 2016-12-03 MED ORDER — MORPHINE SULFATE (PF) 4 MG/ML IV SOLN
1.0000 mg | INTRAVENOUS | Status: DC | PRN
  Administered 2016-12-03 – 2016-12-04 (×5): 1 mg via INTRAVENOUS
  Filled 2016-12-03 (×5): qty 1

## 2016-12-03 MED ORDER — ONDANSETRON HCL 4 MG/2ML IJ SOLN
4.0000 mg | Freq: Once | INTRAMUSCULAR | Status: AC
  Administered 2016-12-03: 4 mg via INTRAVENOUS
  Filled 2016-12-03: qty 2

## 2016-12-03 MED ORDER — SODIUM CHLORIDE 0.9 % IV BOLUS (SEPSIS)
1000.0000 mL | Freq: Once | INTRAVENOUS | Status: AC
  Administered 2016-12-03: 1000 mL via INTRAVENOUS

## 2016-12-03 MED ORDER — PANTOPRAZOLE SODIUM 40 MG IV SOLR
40.0000 mg | Freq: Every day | INTRAVENOUS | Status: DC
  Administered 2016-12-04 – 2016-12-15 (×12): 40 mg via INTRAVENOUS
  Filled 2016-12-03 (×13): qty 40

## 2016-12-03 MED ORDER — PIPERACILLIN-TAZOBACTAM 3.375 G IVPB
3.3750 g | Freq: Three times a day (TID) | INTRAVENOUS | Status: DC
  Administered 2016-12-04: 04:00:00 3.375 g via INTRAVENOUS
  Filled 2016-12-03 (×4): qty 50

## 2016-12-03 MED ORDER — ALBUTEROL SULFATE HFA 108 (90 BASE) MCG/ACT IN AERS
2.0000 | INHALATION_SPRAY | Freq: Four times a day (QID) | RESPIRATORY_TRACT | Status: DC | PRN
  Administered 2016-12-03: 2 via RESPIRATORY_TRACT
  Filled 2016-12-03: qty 6.7

## 2016-12-03 MED ORDER — KCL IN DEXTROSE-NACL 20-5-0.45 MEQ/L-%-% IV SOLN
INTRAVENOUS | Status: DC
  Administered 2016-12-03 – 2016-12-05 (×3): via INTRAVENOUS
  Filled 2016-12-03 (×4): qty 1000

## 2016-12-03 MED ORDER — HEPARIN SODIUM (PORCINE) 5000 UNIT/ML IJ SOLN
5000.0000 [IU] | Freq: Three times a day (TID) | INTRAMUSCULAR | Status: DC
  Administered 2016-12-04 – 2016-12-14 (×31): 5000 [IU] via SUBCUTANEOUS
  Filled 2016-12-03 (×33): qty 1

## 2016-12-03 MED ORDER — IOPAMIDOL (ISOVUE-300) INJECTION 61%
INTRAVENOUS | Status: AC
  Filled 2016-12-03: qty 100

## 2016-12-03 MED ORDER — ONDANSETRON HCL 4 MG/2ML IJ SOLN
4.0000 mg | Freq: Four times a day (QID) | INTRAMUSCULAR | Status: DC | PRN
  Administered 2016-12-04 – 2016-12-21 (×4): 4 mg via INTRAVENOUS
  Filled 2016-12-03 (×4): qty 2

## 2016-12-03 MED ORDER — IOPAMIDOL (ISOVUE-300) INJECTION 61%: 30.0000 mL | Freq: Once | INTRAVENOUS | Status: DC | PRN

## 2016-12-03 MED ORDER — ALBUTEROL SULFATE (2.5 MG/3ML) 0.083% IN NEBU
2.5000 mg | INHALATION_SOLUTION | Freq: Four times a day (QID) | RESPIRATORY_TRACT | Status: DC | PRN
  Administered 2016-12-04 – 2016-12-25 (×11): 2.5 mg via RESPIRATORY_TRACT
  Filled 2016-12-03 (×11): qty 3

## 2016-12-03 MED ORDER — HYDROMORPHONE HCL 1 MG/ML IJ SOLN
1.0000 mg | Freq: Once | INTRAMUSCULAR | Status: AC
Start: 2016-12-03 — End: 2016-12-03
  Administered 2016-12-03: 1 mg via INTRAVENOUS
  Filled 2016-12-03: qty 1

## 2016-12-03 MED ORDER — IOPAMIDOL (ISOVUE-300) INJECTION 61%
100.0000 mL | Freq: Once | INTRAVENOUS | Status: AC | PRN
  Administered 2016-12-03: 100 mL via INTRAVENOUS

## 2016-12-03 MED ORDER — SODIUM CHLORIDE 0.9 % IV SOLN
INTRAVENOUS | Status: DC
  Administered 2016-12-03: 1000 mL via INTRAVENOUS

## 2016-12-03 MED ORDER — ONDANSETRON 4 MG PO TBDP: 4.0000 mg | ORAL_TABLET | Freq: Four times a day (QID) | ORAL | Status: DC | PRN

## 2016-12-03 MED ORDER — PIPERACILLIN-TAZOBACTAM 3.375 G IVPB 30 MIN
3.3750 g | Freq: Once | INTRAVENOUS | Status: AC
  Administered 2016-12-03: 3.375 g via INTRAVENOUS
  Filled 2016-12-03: qty 50

## 2016-12-03 MED ORDER — IOPAMIDOL (ISOVUE-300) INJECTION 61%
INTRAVENOUS | Status: AC
  Filled 2016-12-03: qty 30

## 2016-12-03 MED ORDER — METOPROLOL TARTRATE 50 MG PO TABS
150.0000 mg | ORAL_TABLET | Freq: Every day | ORAL | Status: DC
  Administered 2016-12-03 – 2016-12-25 (×23): 150 mg via ORAL
  Filled 2016-12-03 (×2): qty 3
  Filled 2016-12-03: qty 6
  Filled 2016-12-03: qty 3
  Filled 2016-12-03: qty 6
  Filled 2016-12-03 (×3): qty 3
  Filled 2016-12-03: qty 6
  Filled 2016-12-03 (×3): qty 3
  Filled 2016-12-03: qty 6
  Filled 2016-12-03: qty 3
  Filled 2016-12-03: qty 6
  Filled 2016-12-03 (×7): qty 3
  Filled 2016-12-03: qty 6

## 2016-12-03 NOTE — ED Notes (Signed)
Patient transported to CT 

## 2016-12-03 NOTE — ED Notes (Signed)
Per MD U/A sample ordered. Pt states still unable to urinate. Pt advised of bedpan/comoode/wheelchair to ED restroom. Pt states will inform when ready to provide sample. [employer]

## 2016-12-03 NOTE — ED Triage Notes (Signed)
Per EMS, patient from home, c/o RLQ abdominal pain x3 days. Denies N/V/D, chest pain, and SOB. Hx COPD

## 2016-12-03 NOTE — H&P (Signed)
Chief Complaint:  3 day history of right lower quadrant abdominal pain  History of Present Illness:  Alisha Fisher is an 71 y.o. female who was brought in tonight by her sister with 3 day history of right lower quadrant abdominal pain.  CT showed perforated appendicitis with abscess.  She also has a mass in her chest that will need to be evaluated (r/o cancer).  She is admitted for percutaneous drainage  Past Medical History:  Diagnosis Date  . HTN (hypertension)   . Pneumonia   . SOB (shortness of breath)     Past Surgical History:  Procedure Laterality Date  . NO PAST SURGERIES      Current Facility-Administered Medications  Medication Dose Route Frequency Provider Last Rate Last Dose  . 0.9 %  sodium chloride infusion   Intravenous Continuous Lacretia Leigh, MD 20 mL/hr at 12/03/16 1829 1,000 mL at 12/03/16 1829  . iopamidol (ISOVUE-300) 61 % injection 30 mL  30 mL Oral Once PRN Lacretia Leigh, MD       Current Outpatient Prescriptions  Medication Sig Dispense Refill  . ADVAIR DISKUS 250-50 MCG/DOSE AEPB Inhale 1 puff into the lungs Twice daily.    Marland Kitchen albuterol (PROVENTIL HFA;VENTOLIN HFA) 108 (90 Base) MCG/ACT inhaler Inhale 2 puffs into the lungs every 6 (six) hours as needed for wheezing or shortness of breath.    . ALPRAZolam (XANAX) 0.5 MG tablet Take 0.5 mg by mouth 3 (three) times daily as needed for anxiety.     Marland Kitchen amLODipine (NORVASC) 10 MG tablet Take 10 mg by mouth daily.    . Cholecalciferol (VITAMIN D PO) Take 4,000-5,000 Units by mouth daily.    . citalopram (CELEXA) 10 MG tablet Take 10 mg by mouth daily.    Marland Kitchen losartan-hydrochlorothiazide (HYZAAR) 100-25 MG per tablet Take 1 tablet by mouth daily. 90 tablet 0  . metoprolol (LOPRESSOR) 100 MG tablet Take 1.5 tablets (150 mg total) by mouth daily. 135 tablet 3  . amLODipine (NORVASC) 5 MG tablet Take 1 tablet (5 mg total) by mouth daily. (Patient not taking: Reported on 12/03/2016) 90 tablet 3   Patient has no known  allergies. Family History  Problem Relation Age of Onset  . Allergies    . COPD Cousin    Social History:   reports that she has been smoking Cigarettes.  She has a 20.00 pack-year smoking history. She does not have any smokeless tobacco history on file. She reports that she drinks about 0.5 oz of alcohol per week . She reports that she does not use drugs.   REVIEW OF SYSTEMS : Negative except for see CT of chest;  She has COPD  Physical Exam:   Blood pressure 112/58, pulse 87, temperature 98.2 F (36.8 C), temperature source Oral, resp. rate 18, height 5' 7" (1.702 m), weight 102.1 kg (225 lb), SpO2 92 %. Body mass index is 35.24 kg/m.  Gen:  WDWN WF NAD  Neurological: Alert and oriented to person, place, and time. Motor and sensory function is grossly intact  Head: Normocephalic and atraumatic.  Eyes: Conjunctivae are normal. Pupils are equal, round, and reactive to light. No scleral icterus.  Neck: Normal range of motion. Neck supple. No tracheal deviation or thyromegaly present.  Cardiovascular:  SR without murmurs or gallops.  No carotid bruits Breast:  Not examined Respiratory: Effort normal.  No respiratory distress. No chest wall tenderness. Breath sounds normal.  No wheezes, rales or rhonchi.  Abdomen:  Tender in the right lower  quadrant GU:  Not examined Musculoskeletal: Normal range of motion. Extremities are nontender. No cyanosis, edema or clubbing noted Lymphadenopathy: No cervical, preauricular, postauricular or axillary adenopathy is present Skin: Skin is warm and dry. No rash noted. No diaphoresis. No erythema. No pallor. Pscyh: Normal mood and affect. Behavior is normal. Judgment and thought content normal.   LABORATORY RESULTS: Results for orders placed or performed during the hospital encounter of 12/03/16 (from the past 48 hour(s))  Lipase, blood     Status: None   Collection Time: 12/03/16  4:13 PM  Result Value Ref Range   Lipase 15 11 - 51 U/L   Comprehensive metabolic panel     Status: Abnormal   Collection Time: 12/03/16  4:13 PM  Result Value Ref Range   Sodium 133 (L) 135 - 145 mmol/L   Potassium 3.5 3.5 - 5.1 mmol/L   Chloride 93 (L) 101 - 111 mmol/L   CO2 31 22 - 32 mmol/L   Glucose, Bld 96 65 - 99 mg/dL   BUN 19 6 - 20 mg/dL   Creatinine, Ser 0.92 0.44 - 1.00 mg/dL   Calcium 9.1 8.9 - 10.3 mg/dL   Total Protein 6.7 6.5 - 8.1 g/dL   Albumin 2.8 (L) 3.5 - 5.0 g/dL   AST 19 15 - 41 U/L   ALT 13 (L) 14 - 54 U/L   Alkaline Phosphatase 98 38 - 126 U/L   Total Bilirubin 0.7 0.3 - 1.2 mg/dL   GFR calc non Af Amer >60 >60 mL/min   GFR calc Af Amer >60 >60 mL/min    Comment: (NOTE) The eGFR has been calculated using the CKD EPI equation. This calculation has not been validated in all clinical situations. eGFR's persistently <60 mL/min signify possible Chronic Kidney Disease.    Anion gap 9 5 - 15  CBC     Status: Abnormal   Collection Time: 12/03/16  4:13 PM  Result Value Ref Range   WBC 22.3 (H) 4.0 - 10.5 K/uL   RBC 4.35 3.87 - 5.11 MIL/uL   Hemoglobin 13.7 12.0 - 15.0 g/dL   HCT 39.5 36.0 - 46.0 %   MCV 90.8 78.0 - 100.0 fL   MCH 31.5 26.0 - 34.0 pg   MCHC 34.7 30.0 - 36.0 g/dL   RDW 13.6 11.5 - 15.5 %   Platelets 295 150 - 400 K/uL     RADIOLOGY RESULTS: Ct Abdomen Pelvis W Contrast  Result Date: 12/03/2016 CLINICAL DATA:  RIGHT lower quadrant pain for 3 days. History of COPD, hypertension. EXAM: CT ABDOMEN AND PELVIS WITH CONTRAST TECHNIQUE: Multidetector CT imaging of the abdomen and pelvis was performed using the standard protocol following bolus administration of intravenous contrast. CONTRAST:  11m ISOVUE-300 IOPAMIDOL (ISOVUE-300) INJECTION 61% COMPARISON:  CT chest November 14, 2016 FINDINGS: LOWER CHEST: Heterogeneously hypo dense stable mass RIGHT lower lobe. LEFT lung base atelectasis with small LEFT pleural effusion. Resolution of pericardial effusion. Heart size is mildly enlarged. Mild coronary  artery calcifications. HEPATOBILIARY: Nodular liver consistent with cirrhosis, otherwise unremarkable. Normal gallbladder. PANCREAS: Normal. SPLEEN: Normal. ADRENALS/URINARY TRACT: Kidneys are orthotopic, demonstrating symmetric enhancement. Asymmetrically smaller RIGHT kidney, unchanged. No nephrolithiasis, hydronephrosis or solid renal masses. The unopacified ureters are normal in course and caliber. Delayed imaging through the kidneys demonstrates symmetric prompt contrast excretion within the proximal urinary collecting system. Urinary bladder is partially distended and unremarkable. Normal adrenal glands. STOMACH/BOWEL: The stomach, small and large bowel are normal in course and caliber without inflammatory changes.  2.9 x 5.8 x 5 cm (transverse by AP by CC) rim enhancing the fluid collection and gas RIGHT lower quadrant contiguous with the appendix which is enlarged at 13 mm. VASCULAR/LYMPHATIC: Aortoiliac vessels are normal in course and caliber, severe calcific atherosclerosis. No lymphadenopathy by CT size criteria. Mild Misty mesentery. REPRODUCTIVE: Normal. OTHER: Moderate low-density intraperitoneal free fluid. Mild smoothly enhancing peritoneum. MUSCULOSKELETAL: Nonacute. Small fat and fluid containing umbilical hernia. Grade 1 L4-5 anterolisthesis. Moderate to severe degenerative change of lower lumbar spine. IMPRESSION: Perforated appendicitis with 2.9 x 5.8 x 5 cm RIGHT lower quadrant contained perforation/abscess. Moderate ascites with suspected peritonitis. Cirrhosis. Stable RIGHT lower lobe mass previously characterized as primary lung neoplasm on CT chest November 14, 2016. Slightly increasing small LEFT pleural effusion. Acute findings discussed with and reconfirmed by Dr.ANTHONY ALLEN on 12/03/2016 at 7:07 pm. Electronically Signed   By: Elon Alas M.D.   On: 12/03/2016 19:08    Problem List: Patient Active Problem List   Diagnosis Date Noted  . Ruptured appendicitis 12/03/2016  .  Obesity 05/25/2012  . Smoker 05/25/2012  . COPD (chronic obstructive pulmonary disease) ([place]) 05/25/2012  . Dyspnea 04/11/2012    Assessment & Plan: Perforated appendicitis-plan percutaneous drainage    Matt B. Hassell Done, MD, [facility] [phone] beeper [phone]  12/03/2016 9:07 PM

## 2016-12-03 NOTE — ED Provider Notes (Signed)
[facility] DEPT Provider Note   CSN: [id number] Arrival date & time: 12/03/16  1544     History   Chief Complaint Chief Complaint  Patient presents with  . Abdominal Pain    HPI Alisha Fisher is a 71 y.o. female.  71 year old female presents with 2 day history of right lower quadrant abdominal pain characterized as sharp and worse with movement. She has had subjective chills and fever. Denies any urinary symptoms. She's had nausea but no vomiting or diarrhea. No prior history of same. Denies any prior surgical history. Patient took 6 tablets of ibuprofen prior to arrival without relief of her symptoms.      Past Medical History:  Diagnosis Date  . HTN (hypertension)   . Pneumonia   . SOB (shortness of breath)     Patient Active Problem List   Diagnosis Date Noted  . Obesity 05/25/2012  . Smoker 05/25/2012  . COPD (chronic obstructive pulmonary disease) ([place]) 05/25/2012  . Dyspnea 04/11/2012    Past Surgical History:  Procedure Laterality Date  . NO PAST SURGERIES      OB History    No data available       Home Medications    Prior to Admission medications   Medication Sig Start Date End Date Taking? Authorizing Provider  ADVAIR DISKUS 250-50 MCG/DOSE AEPB Inhale 1 puff into the lungs Twice daily. 03/10/12   Historical Provider, MD  amLODipine (NORVASC) 5 MG tablet Take 1 tablet (5 mg total) by mouth daily. 07/14/13   Belva Crome, MD  clonazePAM (KLONOPIN) 0.5 MG tablet Take 0.5 mg by mouth 2 (two) times daily as needed for anxiety.    Historical Provider, MD  ferrous sulfate 325 (65 FE) MG EC tablet Take 325 mg by mouth daily with breakfast.    Historical Provider, MD  Ipratropium-Albuterol ([place]) Inhale into the lungs as needed.    Historical Provider, MD  losartan-hydrochlorothiazide (HYZAAR) 100-25 MG per tablet Take 1 tablet by mouth daily. 05/04/14   Belva Crome, MD  metoprolol (LOPRESSOR) 100 MG tablet Take 1.5 tablets (150 mg total) by  mouth daily. 07/14/13   Belva Crome, MD  PROAIR HFA 108 (90 BASE) MCG/ACT inhaler Inhale 2 puffs into the lungs Every 6 hours as needed. 03/10/12   Historical Provider, MD    Family History Family History  Problem Relation Age of Onset  . Allergies    . COPD Cousin     Social History Social History  Substance Use Topics  . Smoking status: Current Every Day Smoker    Packs/day: 0.50    Years: 40.00    Types: Cigarettes  . Smokeless tobacco: Not on file  . Alcohol use 0.5 oz/week    1 Standard drinks or equivalent per week     Allergies   Patient has no known allergies.   Review of Systems Review of Systems  All other systems reviewed and are negative.    Physical Exam Updated Vital Signs BP 113/58 (BP Location: Right Arm)   Pulse 95   Temp 98.2 F (36.8 C) (Oral)   Resp 18   Ht '5\' 7"'$  (1.702 m)   Wt 102.1 kg   SpO2 96%   BMI 35.24 kg/m   Physical Exam  Constitutional: She is oriented to person, place, and time. She appears well-developed and well-nourished.  Non-toxic appearance. No distress.  HENT:  Head: Normocephalic and atraumatic.  Eyes: Conjunctivae, EOM and lids are normal. Pupils are equal, round, and  reactive to light.  Neck: Normal range of motion. Neck supple. No tracheal deviation present. No thyroid mass present.  Cardiovascular: Normal rate, regular rhythm and normal heart sounds.  Exam reveals no gallop.   No murmur heard. Pulmonary/Chest: Effort normal and breath sounds normal. No stridor. No respiratory distress. She has no decreased breath sounds. She has no wheezes. She has no rhonchi. She has no rales.  Abdominal: Soft. Normal appearance and bowel sounds are normal. She exhibits no distension. There is tenderness in the right lower quadrant. There is rigidity and guarding. There is no rebound and no CVA tenderness.    Musculoskeletal: Normal range of motion. She exhibits no edema or tenderness.  Neurological: She is alert and oriented to  person, place, and time. She has normal strength. No cranial nerve deficit or sensory deficit. GCS eye subscore is 4. GCS verbal subscore is 5. GCS motor subscore is 6.  Skin: Skin is warm and dry. No abrasion and no rash noted.  Psychiatric: She has a normal mood and affect. Her speech is normal and behavior is normal.  Nursing note and vitals reviewed.    ED Treatments / Results  Labs (all labs ordered are listed, but only abnormal results are displayed) Labs Reviewed  COMPREHENSIVE METABOLIC PANEL - Abnormal; Notable for the following:       Result Value   Sodium 133 (*)    Chloride 93 (*)    Albumin 2.8 (*)    ALT 13 (*)    All other components within normal limits  CBC - Abnormal; Notable for the following:    WBC 22.3 (*)    All other components within normal limits  LIPASE, BLOOD  URINALYSIS, ROUTINE W REFLEX MICROSCOPIC    EKG  EKG Interpretation None       Radiology No results found.  Procedures Procedures (including critical care time)  Medications Ordered in ED Medications  0.9 %  sodium chloride infusion (not administered)  sodium chloride 0.9 % bolus 1,000 mL (not administered)  HYDROmorphone (DILAUDID) injection 1 mg (not administered)  ondansetron (ZOFRAN) injection 4 mg (not administered)     Initial Impression / Assessment and Plan / ED Course  I have reviewed the triage vital signs and the nursing notes.  Pertinent labs & imaging results that were available during my care of the patient were reviewed by me and considered in my medical decision making (see chart for details).     Patient has evidence of ruptured appendicitis with abscess on CT. Heavy medicated for pain here. Started on Zosyn IV. Discuss with Dr. Hassell Done from general surgery and he will come to admit the patient  Final Clinical Impressions(s) / ED Diagnoses   Final diagnoses:  None    New Prescriptions New Prescriptions   No medications on file     Lacretia Leigh,  MD 12/03/16 [phone]

## 2016-12-03 NOTE — ED Notes (Signed)
Floor RN will be ready for patient in 20 minutes.

## 2016-12-03 NOTE — ED Notes (Signed)
Patient was reminded about pending urine sample. Patient is drinking contrast and will call out when ready to give sample.

## 2016-12-04 ENCOUNTER — Inpatient Hospital Stay (HOSPITAL_COMMUNITY): Payer: Medicare Other

## 2016-12-04 ENCOUNTER — Encounter (HOSPITAL_COMMUNITY): Payer: Self-pay

## 2016-12-04 LAB — URINALYSIS, ROUTINE W REFLEX MICROSCOPIC
Bilirubin Urine: NEGATIVE
GLUCOSE, UA: NEGATIVE mg/dL
Hgb urine dipstick: NEGATIVE
KETONES UR: NEGATIVE mg/dL
LEUKOCYTES UA: NEGATIVE
NITRITE: NEGATIVE
PROTEIN: NEGATIVE mg/dL
Specific Gravity, Urine: >1.046 — ABNORMAL HIGH (ref 1.005–1.030)
pH: 5 (ref 5.0–8.0)

## 2016-12-04 LAB — PROTIME-INR
INR: 1.15
Prothrombin Time: 14.8 seconds (ref 11.4–15.2)

## 2016-12-04 MED ORDER — DIPHENHYDRAMINE HCL 25 MG PO CAPS
25.0000 mg | ORAL_CAPSULE | Freq: Four times a day (QID) | ORAL | Status: DC | PRN
Start: 2016-12-04 — End: 2016-12-18
  Administered 2016-12-04 – 2016-12-14 (×17): 25 mg via ORAL
  Filled 2016-12-04 (×18): qty 1

## 2016-12-04 MED ORDER — HYDROMORPHONE HCL 1 MG/ML IJ SOLN
0.5000 mg | INTRAMUSCULAR | Status: DC | PRN
  Administered 2016-12-04 – 2016-12-12 (×12): 1 mg via INTRAVENOUS
  Administered 2016-12-13 (×2): 0.5 mg via INTRAVENOUS
  Administered 2016-12-14: 09:00:00 1 mg via INTRAVENOUS
  Filled 2016-12-04 (×17): qty 1

## 2016-12-04 MED ORDER — FLUMAZENIL 0.5 MG/5ML IV SOLN
INTRAVENOUS | Status: AC
  Filled 2016-12-04: qty 5

## 2016-12-04 MED ORDER — PIPERACILLIN SOD-TAZOBACTAM SO 2.25 (2-0.25) G IV SOLR
3.3750 g | Freq: Three times a day (TID) | INTRAVENOUS | Status: DC
  Administered 2016-12-04 – 2016-12-06 (×6): 3.375 g via INTRAVENOUS
  Filled 2016-12-04 (×9): qty 3.38

## 2016-12-04 MED ORDER — MIDAZOLAM HCL 2 MG/2ML IJ SOLN
INTRAMUSCULAR | Status: AC | PRN
  Administered 2016-12-04 (×2): 1 mg via INTRAVENOUS

## 2016-12-04 MED ORDER — SODIUM CHLORIDE 0.9 % IV SOLN
INTRAVENOUS | Status: AC
  Filled 2016-12-04: qty 250

## 2016-12-04 MED ORDER — MIDAZOLAM HCL 2 MG/2ML IJ SOLN
INTRAMUSCULAR | Status: AC
  Filled 2016-12-04: qty 4

## 2016-12-04 MED ORDER — NALOXONE HCL 0.4 MG/ML IJ SOLN
INTRAMUSCULAR | Status: AC
  Filled 2016-12-04: qty 1

## 2016-12-04 MED ORDER — FENTANYL CITRATE (PF) 100 MCG/2ML IJ SOLN
INTRAMUSCULAR | Status: AC | PRN
  Administered 2016-12-04: 25 ug via INTRAVENOUS
  Administered 2016-12-04: 50 ug via INTRAVENOUS

## 2016-12-04 MED ORDER — FENTANYL CITRATE (PF) 100 MCG/2ML IJ SOLN
INTRAMUSCULAR | Status: AC
  Filled 2016-12-04: qty 4

## 2016-12-04 NOTE — Procedures (Signed)
12 Fr RLQ abscess drain 3 cc pus No comp/EBL

## 2016-12-04 NOTE — Progress Notes (Signed)
Per Lenis Dickinson, I may give PO benadryl for itching, stating IV benadryl would make patient too drowsy.

## 2016-12-04 NOTE — Progress Notes (Signed)
Patient ID: Alisha Fisher, female   DOB: March 04, 1946, 71 y.o.   MRN: [id number]  [facility] Progress Note     Subjective: Patient states that she has had gradually worsening RLQ pain and chills x1 week. She had never had pain like this before. She was taking '1200mg'$  ibuprofen but this did not help. Since admission her pain has moved more to the suprapubic region. Morphine is helping. Lives at home with her son.  Objective: Vital signs in last 24 hours: Temp:  [97.9 F (36.6 C)-98.7 F (37.1 C)] 98.7 F (37.1 C) (03/06 0524) Pulse Rate:  [64-95] 82 (03/06 0524) Resp:  [18] 18 (03/06 0524) BP: (112-126)/(53-69) 117/63 (03/06 0524) SpO2:  [91 %-96 %] 93 % (03/06 0524) Weight:  [225 lb (102.1 kg)] 225 lb (102.1 kg) (03/05 1556) Last BM Date: 12/02/16  Intake/Output from previous day: 03/05 0701 - 03/06 0700 In: 2101.3 [P.O.:120; I.V.:881.3; IV Piggyback:1100] Out: -  Intake/Output this shift: No intake/output data recorded.  PE: Gen:  Alert, NAD, pleasant Card:  RRR, no M/G/R heard Pulm:  CTAB, no W/R/R, effort normal Abd: Soft, distended, +BS, no HSM, TTP suprapubic region and LLQ, minimal RLQ tenderness Ext:  No erythema, edema, or tenderness   Lab Results:   Recent Labs  12/03/16 1613 12/03/16 2142  WBC 22.3* 16.1*  HGB 13.7 11.3*  HCT 39.5 33.0*  PLT 295 224   BMET  Recent Labs  12/03/16 1613  NA 133*  K 3.5  CL 93*  CO2 31  GLUCOSE 96  BUN 19  CREATININE 0.92  CALCIUM 9.1   PT/INR No results for input(s): LABPROT, INR in the last 72 hours. CMP     Component Value Date/Time   NA 133 (L) 12/03/2016 1613   K 3.5 12/03/2016 1613   CL 93 (L) 12/03/2016 1613   CO2 31 12/03/2016 1613   GLUCOSE 96 12/03/2016 1613   BUN 19 12/03/2016 1613   CREATININE 0.92 12/03/2016 1613   CALCIUM 9.1 12/03/2016 1613   PROT 6.7 12/03/2016 1613   ALBUMIN 2.8 (L) 12/03/2016 1613   AST 19 12/03/2016 1613   ALT 13 (L) 12/03/2016 1613   ALKPHOS 98 12/03/2016  1613   BILITOT 0.7 12/03/2016 1613   GFRNONAA >60 12/03/2016 1613   GFRAA >60 12/03/2016 1613   Lipase     Component Value Date/Time   LIPASE 15 12/03/2016 1613       Studies/Results: Ct Abdomen Pelvis W Contrast  Result Date: 12/03/2016 CLINICAL DATA:  RIGHT lower quadrant pain for 3 days. History of COPD, hypertension. EXAM: CT ABDOMEN AND PELVIS WITH CONTRAST TECHNIQUE: Multidetector CT imaging of the abdomen and pelvis was performed using the standard protocol following bolus administration of intravenous contrast. CONTRAST:  134m ISOVUE-300 IOPAMIDOL (ISOVUE-300) INJECTION 61% COMPARISON:  CT chest November 14, 2016 FINDINGS: LOWER CHEST: Heterogeneously hypo dense stable mass RIGHT lower lobe. LEFT lung base atelectasis with small LEFT pleural effusion. Resolution of pericardial effusion. Heart size is mildly enlarged. Mild coronary artery calcifications. HEPATOBILIARY: Nodular liver consistent with cirrhosis, otherwise unremarkable. Normal gallbladder. PANCREAS: Normal. SPLEEN: Normal. ADRENALS/URINARY TRACT: Kidneys are orthotopic, demonstrating symmetric enhancement. Asymmetrically smaller RIGHT kidney, unchanged. No nephrolithiasis, hydronephrosis or solid renal masses. The unopacified ureters are normal in course and caliber. Delayed imaging through the kidneys demonstrates symmetric prompt contrast excretion within the proximal urinary collecting system. Urinary bladder is partially distended and unremarkable. Normal adrenal glands. STOMACH/BOWEL: The stomach, small and large bowel are normal in course and caliber  without inflammatory changes. 2.9 x 5.8 x 5 cm (transverse by AP by CC) rim enhancing the fluid collection and gas RIGHT lower quadrant contiguous with the appendix which is enlarged at 13 mm. VASCULAR/LYMPHATIC: Aortoiliac vessels are normal in course and caliber, severe calcific atherosclerosis. No lymphadenopathy by CT size criteria. Mild Misty mesentery. REPRODUCTIVE:  Normal. OTHER: Moderate low-density intraperitoneal free fluid. Mild smoothly enhancing peritoneum. MUSCULOSKELETAL: Nonacute. Small fat and fluid containing umbilical hernia. Grade 1 L4-5 anterolisthesis. Moderate to severe degenerative change of lower lumbar spine. IMPRESSION: Perforated appendicitis with 2.9 x 5.8 x 5 cm RIGHT lower quadrant contained perforation/abscess. Moderate ascites with suspected peritonitis. Cirrhosis. Stable RIGHT lower lobe mass previously characterized as primary lung neoplasm on CT chest November 14, 2016. Slightly increasing small LEFT pleural effusion. Acute findings discussed with and reconfirmed by Dr.ANTHONY ALLEN on 12/03/2016 at 7:07 pm. Electronically Signed   By: Elon Alas M.D.   On: 12/03/2016 19:08    Anti-infectives: Anti-infectives    Start     Dose/Rate Route Frequency Ordered Stop   12/04/16 0630  piperacillin-tazobactam (ZOSYN) 3.375 g in dextrose 5 % 50 mL IVPB     3.375 g 12.5 mL/hr over 240 Minutes Intravenous Every 8 hours 12/04/16 0623     12/03/16 2130  piperacillin-tazobactam (ZOSYN) IVPB 3.375 g  Status:  Discontinued     3.375 g 12.5 mL/hr over 240 Minutes Intravenous Every 8 hours 12/03/16 2125 12/04/16 0623   12/03/16 1915  piperacillin-tazobactam (ZOSYN) IVPB 3.375 g     3.375 g 100 mL/hr over 30 Minutes Intravenous  Once 12/03/16 1907 12/03/16 2005       Assessment/Plan Ruptured appendicitis - no abdominal surgical history - 1 week of gradually worsening RLQ pain and chills - CT scan showed perforated appendicitis with 2.9 x 5.8 x 5 cm RIGHT lower quadrant contained perforation/abscess - WBC 16.1, afebrile  COPD - on O2 at home Recent pneumonia - earlier this year HTN - metoprolol Former smoker - quit earlier this year RIGHT lower lobe mass - stable from previous CT, will need further workup  ID - zosyn 3/5>> FEN - IVF, NPO VTE - SCDs, heparin  Plan - Will consult IR for perc drainage. Keep NPO for procedure and  hold heparin.    LOS: 1 day    Jerrye Beavers , [facility] 12/04/2016, 7:40 AM Pager: [phone] Consults: [phone] Mon-Fri 7:00 am-4:30 pm Sat-Sun 7:00 am-11:30 am

## 2016-12-04 NOTE — Consult Note (Signed)
Chief Complaint: Patient was seen in consultation today for CT guided aspiration/possible drainage of RLQ abdomino-pelvic abscess Chief Complaint  Patient presents with  . Abdominal Pain    Referring Physician(s): Toth,P  Supervising Physician: Marybelle Killings  Patient Status: [facility] - In-pt  History of Present Illness: Alisha Fisher is a 71 y.o. female ,former smoker, with history of hypertension, COPD, who presented to ED yesterday with 3 day history of right lower quadrant abdominal pain. Subsequent imaging revealed:  Perforated appendicitis with 2.9 x 5.8 x 5 cm right lower quadrant contained perforation/abscess.Moderate ascites with suspected peritonitis. Cirrhosis. 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 Slightly increasing small LEFT pleural effusion.   Request now received from surgery for CT-guided aspiration/possible drainage of this right lower quadrant abscess. Past Medical History:  Diagnosis Date  . HTN (hypertension)   . Pneumonia   . SOB (shortness of breath)     Past Surgical History:  Procedure Laterality Date  . NO PAST SURGERIES      Allergies: Patient has no known allergies.  Medications: Prior to Admission medications   Medication Sig Start Date End Date Taking? Authorizing Provider  ADVAIR DISKUS 250-50 MCG/DOSE AEPB Inhale 1 puff into the lungs Twice daily. 03/10/12  Yes Historical Provider, MD  albuterol (PROVENTIL HFA;VENTOLIN HFA) 108 (90 Base) MCG/ACT inhaler Inhale 2 puffs into the lungs every 6 (six) hours as needed for wheezing or shortness of breath.   Yes Historical Provider, MD  ALPRAZolam Duanne Moron) 0.5 MG tablet Take 0.5 mg by mouth 3 (three) times daily as needed for anxiety.  11/21/16  Yes Historical Provider, MD  amLODipine (NORVASC) 10 MG tablet Take 10 mg by mouth daily. 09/11/16  Yes Historical Provider, MD  Cholecalciferol (VITAMIN D PO) Take 4,000-5,000 Units by mouth  daily.   Yes Historical Provider, MD  citalopram (CELEXA) 10 MG tablet Take 10 mg by mouth daily. 11/21/16  Yes Historical Provider, MD  losartan-hydrochlorothiazide (HYZAAR) 100-25 MG per tablet Take 1 tablet by mouth daily. 05/04/14  Yes Belva Crome, MD  metoprolol (LOPRESSOR) 100 MG tablet Take 1.5 tablets (150 mg total) by mouth daily. 07/14/13  Yes Belva Crome, MD  amLODipine (NORVASC) 5 MG tablet Take 1 tablet (5 mg total) by mouth daily. Patient not taking: Reported on 12/03/2016 07/14/13   Belva Crome, MD     Family History  Problem Relation Age of Onset  . Allergies    . COPD Cousin     Social History   Social History  . Marital status: Widowed    Spouse name: N/A  . Number of children: N/A  . Years of education: N/A   Occupational History  . retired    Social History Main Topics  . Smoking status: Current Every Day Smoker    Packs/day: 0.50    Years: 40.00    Types: Cigarettes  . Smokeless tobacco: Never Used  . Alcohol use 0.5 oz/week    1 Standard drinks or equivalent per week  . Drug use: No  . Sexual activity: Not Asked   Other Topics Concern  . None   Social History Narrative  . None     Review of Systems see above; currently denies fever, headache, worsening chest pain, back pain, nausea vomiting or abnormal bleeding. She does have some dyspnea with exertion and right lower quadrant discomfort especially with coughing. Also notes occasional left leg discomfort.  Vital Signs: BP Marland Kitchen)  145/79 (BP Location: Right Arm)   Pulse 81   Temp 97.7 F (36.5 C) (Oral)   Resp 18   Ht '5\' 7"'$  (1.702 m)   Wt 225 lb (102.1 kg)   SpO2 95%   BMI 35.24 kg/m   Physical Exam patient awake, answering questions appropriately. Chest with diminished breath sounds bases. Heart with regular rate and rhythm. Abdomen obese, sl distended, soft, positive bowel sounds, tender right lower quadrant; extremities with no significant edema.  Mallampati Score:     Imaging: Ct  Abdomen Pelvis W Contrast  Result Date: 12/03/2016 CLINICAL DATA:  RIGHT lower quadrant pain for 3 days. History of COPD, hypertension. EXAM: CT ABDOMEN AND PELVIS WITH CONTRAST TECHNIQUE: Multidetector CT imaging of the abdomen and pelvis was performed using the standard protocol following bolus administration of intravenous contrast. CONTRAST:  126m ISOVUE-300 IOPAMIDOL (ISOVUE-300) INJECTION 61% COMPARISON:  CT chest November 14, 2016 FINDINGS: LOWER CHEST: Heterogeneously hypo dense stable mass RIGHT lower lobe. LEFT lung base atelectasis with small LEFT pleural effusion. Resolution of pericardial effusion. Heart size is mildly enlarged. Mild coronary artery calcifications. HEPATOBILIARY: Nodular liver consistent with cirrhosis, otherwise unremarkable. Normal gallbladder. PANCREAS: Normal. SPLEEN: Normal. ADRENALS/URINARY TRACT: Kidneys are orthotopic, demonstrating symmetric enhancement. Asymmetrically smaller RIGHT kidney, unchanged. No nephrolithiasis, hydronephrosis or solid renal masses. The unopacified ureters are normal in course and caliber. Delayed imaging through the kidneys demonstrates symmetric prompt contrast excretion within the proximal urinary collecting system. Urinary bladder is partially distended and unremarkable. Normal adrenal glands. STOMACH/BOWEL: The stomach, small and large bowel are normal in course and caliber without inflammatory changes. 2.9 x 5.8 x 5 cm (transverse by AP by CC) rim enhancing the fluid collection and gas RIGHT lower quadrant contiguous with the appendix which is enlarged at 13 mm. VASCULAR/LYMPHATIC: Aortoiliac vessels are normal in course and caliber, severe calcific atherosclerosis. No lymphadenopathy by CT size criteria. Mild Misty mesentery. REPRODUCTIVE: Normal. OTHER: Moderate low-density intraperitoneal free fluid. Mild smoothly enhancing peritoneum. MUSCULOSKELETAL: Nonacute. Small fat and fluid containing umbilical hernia. Grade 1 L4-5 anterolisthesis.  Moderate to severe degenerative change of lower lumbar spine. IMPRESSION: Perforated appendicitis with 2.9 x 5.8 x 5 cm RIGHT lower quadrant contained perforation/abscess. Moderate ascites with suspected peritonitis. Cirrhosis. Stable RIGHT lower lobe mass previously characterized as primary lung neoplasm on CT chest November 14, 2016. Slightly increasing small LEFT pleural effusion. Acute findings discussed with and reconfirmed by Dr.ANTHONY ALLEN on 12/03/2016 at 7:07 pm. Electronically Signed   By: CElon AlasM.D.   On: 12/03/2016 19:08    Labs:  CBC:  Recent Labs  12/03/16 1613 12/03/16 2142  WBC 22.3* 16.1*  HGB 13.7 11.3*  HCT 39.5 33.0*  PLT 295 224    COAGS: No results for input(s): INR, APTT in the last 8760 hours.  BMP:  Recent Labs  12/03/16 1613  NA 133*  K 3.5  CL 93*  CO2 31  GLUCOSE 96  BUN 19  CALCIUM 9.1  CREATININE 0.92  GFRNONAA >60  GFRAA >60    LIVER FUNCTION TESTS:  Recent Labs  12/03/16 1613  BILITOT 0.7  AST 19  ALT 13*  ALKPHOS 98  PROT 6.7  ALBUMIN 2.8*    TUMOR MARKERS: No results for input(s): AFPTM, CEA, CA199, CHROMGRNA in the last 8760 hours.  Assessment and Plan: Patient with history of perforated appendicitis with associated abscess, hypertension, COPD, right lung mass, small left pleural effusion, ascites/cirrhosis on imaging, leukocytosis; request now received from surgery for CT-guided aspiration/possible drainage  of right lower quadrant abscess. Imaging studies have been reviewed by Dr. Barbie Banner. Details/risks of procedure, including but not limited to, internal bleeding, infection, injury to adjacent structures, inability to place drainage catheter discussed with patient with her understanding and consent. Procedure tent scheduled for later this afternoon.   Thank you for this interesting consult.  I greatly enjoyed meeting [facility] and look forward to participating in their care.  A copy of this report was sent to  the requesting provider on this date.  Electronically Signed: D. Rowe Robert 12/04/2016, 9:32 AM   I spent a total of 30 minutes  in face to face in clinical consultation, greater than 50% of which was counseling/coordinating care for CT-guided pelvic abscess aspiration/drainage

## 2016-12-05 ENCOUNTER — Inpatient Hospital Stay (HOSPITAL_COMMUNITY): Payer: Medicare Other

## 2016-12-05 LAB — CBC
HCT: 35.3 % — ABNORMAL LOW (ref 36.0–46.0)
HEMOGLOBIN: 11.7 g/dL — AB (ref 12.0–15.0)
MCH: 30.2 pg (ref 26.0–34.0)
MCHC: 33.1 g/dL (ref 30.0–36.0)
MCV: 91.2 fL (ref 78.0–100.0)
Platelets: 257 10*3/uL (ref 150–400)
RBC: 3.87 MIL/uL (ref 3.87–5.11)
RDW: 13.7 % (ref 11.5–15.5)
WBC: 14.9 10*3/uL — ABNORMAL HIGH (ref 4.0–10.5)

## 2016-12-05 LAB — BASIC METABOLIC PANEL
ANION GAP: 5 (ref 5–15)
BUN: 25 mg/dL — AB (ref 6–20)
CHLORIDE: 95 mmol/L — AB (ref 101–111)
CO2: 28 mmol/L (ref 22–32)
Calcium: 8.3 mg/dL — ABNORMAL LOW (ref 8.9–10.3)
Creatinine, Ser: 0.79 mg/dL (ref 0.44–1.00)
GFR calc Af Amer: >60 mL/min (ref >60)
GFR calc non Af Amer: >60 mL/min (ref >60)
Glucose, Bld: 98 mg/dL (ref 65–99)
POTASSIUM: 4.4 mmol/L (ref 3.5–5.1)
Sodium: 128 mmol/L — ABNORMAL LOW (ref 135–145)

## 2016-12-05 MED ORDER — DIPHENHYDRAMINE HCL 50 MG/ML IJ SOLN
12.5000 mg | Freq: Four times a day (QID) | INTRAMUSCULAR | Status: DC | PRN
  Administered 2016-12-05 – 2016-12-16 (×4): 12.5 mg via INTRAVENOUS
  Filled 2016-12-05 (×4): qty 1

## 2016-12-05 MED ORDER — ACETAMINOPHEN 325 MG PO TABS
650.0000 mg | ORAL_TABLET | Freq: Four times a day (QID) | ORAL | Status: DC | PRN
  Administered 2016-12-19 – 2016-12-23 (×3): 650 mg via ORAL
  Filled 2016-12-05 (×3): qty 2

## 2016-12-05 MED ORDER — SODIUM CHLORIDE 1 G PO TABS
1.0000 g | ORAL_TABLET | Freq: Three times a day (TID) | ORAL | Status: AC
Start: 2016-12-05 — End: 2016-12-05
  Administered 2016-12-05 (×3): 1 g via ORAL
  Filled 2016-12-05 (×3): qty 1

## 2016-12-05 MED ORDER — KETOROLAC TROMETHAMINE 15 MG/ML IJ SOLN
15.0000 mg | Freq: Four times a day (QID) | INTRAMUSCULAR | Status: AC
  Administered 2016-12-05 – 2016-12-10 (×20): 15 mg via INTRAVENOUS
  Filled 2016-12-05 (×20): qty 1

## 2016-12-05 MED ORDER — DEXTROSE-NACL 5-0.9 % IV SOLN
INTRAVENOUS | Status: AC
  Administered 2016-12-05 – 2016-12-14 (×15): via INTRAVENOUS
  Filled 2016-12-05: qty 1000

## 2016-12-05 MED ORDER — LIP MEDEX EX OINT
TOPICAL_OINTMENT | CUTANEOUS | Status: AC
  Administered 2016-12-05: 09:00:00
  Filled 2016-12-05: qty 7

## 2016-12-05 NOTE — Progress Notes (Signed)
Patient ID: Alisha Fisher, female   DOB: [birth year], 71 y.o.   MRN: [id number]  [facility] Surgery Progress Note     Subjective: Drain placed in IR yesterday. No improvement in abdominal pain.  Last night her O2 dropped into the 70's, she was placed on non-breather mask and received breathing treatment with RT. Sats improved. O2 seems to drop while sleeping. Continues to have itching with pain medication, did not improve with switching morphine to dilaudid. States that this always happens when she takes pain medication, and she just uses benadryl PRN.  Objective: Vital signs in last 24 hours: Temp:  [97.7 F (36.5 C)-98.8 F (37.1 C)] 98.8 F (37.1 C) (03/06 2220) Pulse Rate:  [75-83] 83 (03/06 2220) Resp:  [12-18] 12 (03/06 2220) BP: (108-161)/(50-79) 161/61 (03/06 2220) SpO2:  [87 %-100 %] 100 % (03/07 0725) FiO2 (%):  [100 %] 100 % (03/07 0725) Last BM Date: 12/02/16  Intake/Output from previous day: 03/06 0701 - 03/07 0700 In: 1075 [P.O.:120; I.V.:900; IV Piggyback:50] Out: 470 [Urine:450; Drains:20] Intake/Output this shift: No intake/output data recorded.  PE: Gen:  Alert, NAD, pleasant Card:  RRR, no M/G/R heard Pulm:  few scattered rhonchi, no wheezes, effort normal Abd: Soft, distended, +BS, no HSM, TTP lower abdomen Ext:  No erythema, edema, or tenderness  Skin: no rashes  Lab Results:   Recent Labs  12/03/16 2142 12/05/16 0500  WBC 16.1* 14.9*  HGB 11.3* 11.7*  HCT 33.0* 35.3*  PLT 224 257   BMET  Recent Labs  12/03/16 1613 12/05/16 0500  NA 133* 128*  K 3.5 4.4  CL 93* 95*  CO2 31 28  GLUCOSE 96 98  BUN 19 25*  CREATININE 0.92 0.79  CALCIUM 9.1 8.3*   PT/INR  Recent Labs  12/04/16 1005  LABPROT 14.8  INR 1.15   CMP     Component Value Date/Time   NA 128 (L) 12/05/2016 0500   K 4.4 12/05/2016 0500   CL 95 (L) 12/05/2016 0500   CO2 28 12/05/2016 0500   GLUCOSE 98 12/05/2016 0500   BUN 25 (H) 12/05/2016 0500   CREATININE  0.79 12/05/2016 0500   CALCIUM 8.3 (L) 12/05/2016 0500   PROT 6.7 12/03/2016 1613   ALBUMIN 2.8 (L) 12/03/2016 1613   AST 19 12/03/2016 1613   ALT 13 (L) 12/03/2016 1613   ALKPHOS 98 12/03/2016 1613   BILITOT 0.7 12/03/2016 1613   GFRNONAA >60 12/05/2016 0500   GFRAA >60 12/05/2016 0500   Lipase     Component Value Date/Time   LIPASE 15 12/03/2016 1613       Studies/Results: Ct Abdomen Pelvis W Contrast  Result Date: 12/03/2016 CLINICAL DATA:  RIGHT lower quadrant pain for 3 days. History of COPD, hypertension. EXAM: CT ABDOMEN AND PELVIS WITH CONTRAST TECHNIQUE: Multidetector CT imaging of the abdomen and pelvis was performed using the standard protocol following bolus administration of intravenous contrast. CONTRAST:  149m ISOVUE-300 IOPAMIDOL (ISOVUE-300) INJECTION 61% COMPARISON:  CT chest November 14, 2016 FINDINGS: LOWER CHEST: Heterogeneously hypo dense stable mass RIGHT lower lobe. LEFT lung base atelectasis with small LEFT pleural effusion. Resolution of pericardial effusion. Heart size is mildly enlarged. Mild coronary artery calcifications. HEPATOBILIARY: Nodular liver consistent with cirrhosis, otherwise unremarkable. Normal gallbladder. PANCREAS: Normal. SPLEEN: Normal. ADRENALS/URINARY TRACT: Kidneys are orthotopic, demonstrating symmetric enhancement. Asymmetrically smaller RIGHT kidney, unchanged. No nephrolithiasis, hydronephrosis or solid renal masses. The unopacified ureters are normal in course and caliber. Delayed imaging through the kidneys demonstrates symmetric prompt  contrast excretion within the proximal urinary collecting system. Urinary bladder is partially distended and unremarkable. Normal adrenal glands. STOMACH/BOWEL: The stomach, small and large bowel are normal in course and caliber without inflammatory changes. 2.9 x 5.8 x 5 cm (transverse by AP by CC) rim enhancing the fluid collection and gas RIGHT lower quadrant contiguous with the appendix which is  enlarged at 13 mm. VASCULAR/LYMPHATIC: Aortoiliac vessels are normal in course and caliber, severe calcific atherosclerosis. No lymphadenopathy by CT size criteria. Mild Misty mesentery. REPRODUCTIVE: Normal. OTHER: Moderate low-density intraperitoneal free fluid. Mild smoothly enhancing peritoneum. MUSCULOSKELETAL: Nonacute. Small fat and fluid containing umbilical hernia. Grade 1 L4-5 anterolisthesis. Moderate to severe degenerative change of lower lumbar spine. IMPRESSION: Perforated appendicitis with 2.9 x 5.8 x 5 cm RIGHT lower quadrant contained perforation/abscess. Moderate ascites with suspected peritonitis. Cirrhosis. Stable RIGHT lower lobe mass previously characterized as primary lung neoplasm on CT chest November 14, 2016. Slightly increasing small LEFT pleural effusion. Acute findings discussed with and reconfirmed by Dr.ANTHONY ALLEN on 12/03/2016 at 7:07 pm. Electronically Signed   By: Elon Alas M.D.   On: 12/03/2016 19:08    Anti-infectives: Anti-infectives    Start     Dose/Rate Route Frequency Ordered Stop   12/04/16 0630  piperacillin-tazobactam (ZOSYN) 3.375 g in dextrose 5 % 50 mL IVPB     3.375 g 12.5 mL/hr over 240 Minutes Intravenous Every 8 hours 12/04/16 0623     12/03/16 2130  piperacillin-tazobactam (ZOSYN) IVPB 3.375 g  Status:  Discontinued     3.375 g 12.5 mL/hr over 240 Minutes Intravenous Every 8 hours 12/03/16 2125 12/04/16 0623   12/03/16 1915  piperacillin-tazobactam (ZOSYN) IVPB 3.375 g     3.375 g 100 mL/hr over 30 Minutes Intravenous  Once 12/03/16 1907 12/03/16 2005       Assessment/Plan Ruptured appendicitis - no abdominal surgical history - CT scan showed perforated appendicitis with 2.9 x 5.8 x 5 cm RIGHT lower quadrant contained perforation/abscess - s/p IR drain 3/6, culture pending  - WBC trending down 14.9, afebrile  COPD - on O2 at home. Consider sleep study OP Recent pneumonia - earlier this year HTN - metoprolol Former smoker -  quit earlier this year RIGHT lower lobe mass - stable from previous CT, will need further workup  ID - zosyn 3/5>> FEN - IVF, NPO VTE - SCDs, heparin  Plan - Continue drain and IV antibiotics. Follow culture. Continue NPO. Encourage IS. Replace electrolytes (sodium tabs and in IVF). Add toradol for better pain control. Recheck CBC/BMP in AM Continue monitoring O2 sats; currently on 5L and working with RT.   Addendum: Rechecked patient this afternoon. O2 sats stable on 5L O2. RN states that patient coughed up large amount of mucus. Will order CXR.    LOS: 2 days    Jerrye Beavers , [facility] Surgery 12/05/2016, 7:39 AM Pager: [phone] Consults: [phone] Mon-Fri 7:00 am-4:30 pm Sat-Sun 7:00 am-11:30 am

## 2016-12-05 NOTE — Progress Notes (Signed)
Around 0645 this morning, pt's vital signs were taken; NT reported pt's oxygen saturation was in the 70's. RN bumped up her O2 from 2 to 4L but still no improvement; pt's was assessed and chest auscultated with bilateral raspy breathing and shortness of breath; RT was called and notified to start breathing treatment which would greatly benefit pt. A NRB mask was applied on pt, which bumped  her oxygen level to the high 90's. Breathing treatment is administered by RT at the moment. Will continue to monitor.

## 2016-12-05 NOTE — Progress Notes (Signed)
Respiratory at bedside to administer breathing treatment. Non-rebreather still maintaining O2 levels.

## 2016-12-05 NOTE — Progress Notes (Signed)
During bedside report, patient was noted to have an O2 level in the low 70s. She was on 2L [place]. Patient was alert and able to take deep breaths with minimal improvement in O2 level. Night shift RN called the respiratory therapist to give patient a breathing treatment, patient was placed on a non-rebreather mask and the O2 sat slowly increased to the mid 90s. As patient drifts off to sleep, O2 decreases again. Patient has a couple of sedating medications that may be contributing to this issue. Will continue to monitor patient.

## 2016-12-06 LAB — BASIC METABOLIC PANEL
Anion gap: 5 (ref 5–15)
BUN: 23 mg/dL — AB (ref 6–20)
CALCIUM: 8.5 mg/dL — AB (ref 8.9–10.3)
CHLORIDE: 102 mmol/L (ref 101–111)
CO2: 29 mmol/L (ref 22–32)
CREATININE: 0.71 mg/dL (ref 0.44–1.00)
GFR calc Af Amer: >60 mL/min (ref >60)
GFR calc non Af Amer: >60 mL/min (ref >60)
Glucose, Bld: 97 mg/dL (ref 65–99)
Potassium: 4.2 mmol/L (ref 3.5–5.1)
SODIUM: 136 mmol/L (ref 135–145)

## 2016-12-06 LAB — CBC
HCT: 34 % — ABNORMAL LOW (ref 36.0–46.0)
Hemoglobin: 11 g/dL — ABNORMAL LOW (ref 12.0–15.0)
MCH: 29.7 pg (ref 26.0–34.0)
MCHC: 32.4 g/dL (ref 30.0–36.0)
MCV: 91.9 fL (ref 78.0–100.0)
Platelets: 242 10*3/uL (ref 150–400)
RBC: 3.7 MIL/uL — ABNORMAL LOW (ref 3.87–5.11)
RDW: 13.7 % (ref 11.5–15.5)
WBC: 11.6 10*3/uL — ABNORMAL HIGH (ref 4.0–10.5)

## 2016-12-06 MED ORDER — CIPROFLOXACIN IN D5W 400 MG/200ML IV SOLN
400.0000 mg | Freq: Two times a day (BID) | INTRAVENOUS | Status: DC
  Administered 2016-12-06 – 2016-12-07 (×2): 400 mg via INTRAVENOUS
  Filled 2016-12-06 (×2): qty 200

## 2016-12-06 MED ORDER — PIPERACILLIN-TAZOBACTAM 3.375 G IVPB
3.3750 g | Freq: Three times a day (TID) | INTRAVENOUS | Status: DC
  Filled 2016-12-06: qty 50

## 2016-12-06 MED ORDER — CIPROFLOXACIN IN D5W 200 MG/100ML IV SOLN
200.0000 mg | Freq: Two times a day (BID) | INTRAVENOUS | Status: DC
  Administered 2016-12-06: 200 mg via INTRAVENOUS
  Filled 2016-12-06 (×2): qty 100

## 2016-12-06 MED ORDER — MOMETASONE FURO-FORMOTEROL FUM 200-5 MCG/ACT IN AERO
2.0000 | INHALATION_SPRAY | Freq: Two times a day (BID) | RESPIRATORY_TRACT | Status: DC
  Administered 2016-12-06 – 2016-12-18 (×22): 2 via RESPIRATORY_TRACT
  Filled 2016-12-06 (×2): qty 8.8

## 2016-12-06 MED ORDER — METRONIDAZOLE IN NACL 5-0.79 MG/ML-% IV SOLN
500.0000 mg | Freq: Three times a day (TID) | INTRAVENOUS | Status: DC
  Administered 2016-12-06 – 2016-12-08 (×7): 500 mg via INTRAVENOUS
  Filled 2016-12-06 (×7): qty 100

## 2016-12-06 MED ORDER — DIPHENHYDRAMINE-ZINC ACETATE 2-0.1 % EX CREA
TOPICAL_CREAM | Freq: Three times a day (TID) | CUTANEOUS | Status: DC | PRN
  Administered 2016-12-07 – 2016-12-12 (×3): via TOPICAL
  Filled 2016-12-06 (×3): qty 28

## 2016-12-06 NOTE — Progress Notes (Signed)
PT Cancellation Note  Patient Details Name: Alisha Fisher MRN: [id number] DOB: Aug 31, 1946   Cancelled Treatment:    Reason Eval/Treat Not Completed: Other (comment) (patient declined to ambulate. States the odor from the drain is making her nauseated. )   Alisha Fisher 12/06/2016, 4:30 PM Alisha Fisher PT [phone]

## 2016-12-06 NOTE — Progress Notes (Signed)
Patient ID: Blanche East, female   DOB: [birth year], 71 y.o.   MRN: [id number]  [facility] Surgery Progress Note     Subjective: States that she is feeling better today than yesterday. This is the first time she has been out of bed and that has helped. Abdomen is still sore and distended, but improving. No appetite but does feel thirsty. States that last night a diffuse erythematous, pruritic rash appeared on her trunk and upper extremities. Initially thought itching was from narcotics, but she has not received any narcotics in 24 hours. No breathing issues over night. States that she takes advair at home BID which she has not been getting here.  Objective: Vital signs in last 24 hours: Temp:  [98 F (36.7 C)-98.7 F (37.1 C)] 98 F (36.7 C) (03/08 0637) Pulse Rate:  [73-92] 80 (03/08 0637) Resp:  [16-18] 18 (03/08 0637) BP: (116-150)/(37-64) 150/64 (03/08 0637) SpO2:  [94 %-100 %] 94 % (03/08 0637) Last BM Date: 12/02/16  Intake/Output from previous day: 03/07 0701 - 03/08 0700 In: 980 [I.V.:925; IV Piggyback:50] Out: 845 [Urine:825; Drains:20] Intake/Output this shift: No intake/output data recorded.  PE: Gen: Alert, NAD, pleasant Card: RRR, no M/G/R heard Pulm: few scattered rhonchi, no wheezes, effort normal Abd: Soft, distended, +BS, no HSM, TTP lower abdomen Skin: diffuse maculopapular rash on trunk and upper extremities  Lab Results:   Recent Labs  12/05/16 0500 12/06/16 0458  WBC 14.9* 11.6*  HGB 11.7* 11.0*  HCT 35.3* 34.0*  PLT 257 242   BMET  Recent Labs  12/05/16 0500 12/06/16 0458  NA 128* 136  K 4.4 4.2  CL 95* 102  CO2 28 29  GLUCOSE 98 97  BUN 25* 23*  CREATININE 0.79 0.71  CALCIUM 8.3* 8.5*   PT/INR  Recent Labs  12/04/16 1005  LABPROT 14.8  INR 1.15   CMP     Component Value Date/Time   NA 136 12/06/2016 0458   K 4.2 12/06/2016 0458   CL 102 12/06/2016 0458   CO2 29 12/06/2016 0458   GLUCOSE 97 12/06/2016 0458    BUN 23 (H) 12/06/2016 0458   CREATININE 0.71 12/06/2016 0458   CALCIUM 8.5 (L) 12/06/2016 0458   PROT 6.7 12/03/2016 1613   ALBUMIN 2.8 (L) 12/03/2016 1613   AST 19 12/03/2016 1613   ALT 13 (L) 12/03/2016 1613   ALKPHOS 98 12/03/2016 1613   BILITOT 0.7 12/03/2016 1613   GFRNONAA >60 12/06/2016 0458   GFRAA >60 12/06/2016 0458   Lipase     Component Value Date/Time   LIPASE 15 12/03/2016 1613       Studies/Results: Dg Chest Port 1 View  Result Date: 12/05/2016 CLINICAL DATA:  Weakness and shortness of breath EXAM: PORTABLE CHEST 1 VIEW COMPARISON:  11/19/2016 FINDINGS: Moderate cardiomegaly with central vascular congestion. There are small bilateral pleural effusions and hazy bibasilar atelectasis or infiltrate. No pneumothorax. Atherosclerosis. IMPRESSION: 1. Cardiomegaly with central vascular congestion and small bilateral effusions 2. Hazy bibasilar atelectasis or infiltrates. Electronically Signed   By: Donavan Foil M.D.   On: 12/05/2016 15:29   Ct Image Guided Drainage By Percutaneous Catheter  Result Date: 12/05/2016 INDICATION: Periappendiceal abscess EXAM: CT GUIDED DRAINAGE OF RIGHT LOWER QUADRANT ABSCESS MEDICATIONS: The patient is currently admitted to the hospital and receiving intravenous antibiotics. The antibiotics were administered within an appropriate time frame prior to the initiation of the procedure. ANESTHESIA/SEDATION: Two mg IV Versed 75 mcg IV Fentanyl Moderate Sedation Time:  17 The  patient was continuously monitored during the procedure by the interventional radiology nurse under my direct supervision. COMPLICATIONS: None immediate. TECHNIQUE: Informed written consent was obtained from the patient after a thorough discussion of the procedural risks, benefits and alternatives. All questions were addressed. Maximal Sterile Barrier Technique was utilized including caps, mask, sterile gowns, sterile gloves, sterile drape, hand hygiene and skin antiseptic. A timeout  was performed prior to the initiation of the procedure. PROCEDURE: The right lower quadrant was prepped with ChloraPrep in a sterile fashion, and a sterile drape was applied covering the operative field. A sterile gown and sterile gloves were used for the procedure. Local anesthesia was provided with 1% Lidocaine. Under CT guidance, an 18 gauge needle was advanced into the right lower quadrant abscess and removed over an Amplatz wire. Twelve [place] dilator followed by a 12 [place] drain were inserted. It was looped and string fixed then sewn to the skin. FINDINGS: Images document right lower quadrant 12 French drain placement into an abscess. IMPRESSION: Successful CT-guided right lower quadrant abscess 12 French drain. Electronically Signed   By: Marybelle Killings M.D.   On: 12/05/2016 09:10    Anti-infectives: Anti-infectives    Start     Dose/Rate Route Frequency Ordered Stop   12/06/16 1400  piperacillin-tazobactam (ZOSYN) IVPB 3.375 g  Status:  Discontinued     3.375 g 12.5 mL/hr over 240 Minutes Intravenous Every 8 hours 12/06/16 0643 12/06/16 0825   12/06/16 0830  ciprofloxacin (CIPRO) IVPB 200 mg     200 mg 100 mL/hr over 60 Minutes Intravenous Every 12 hours 12/06/16 0825     12/06/16 0830  metroNIDAZOLE (FLAGYL) IVPB 500 mg     500 mg 100 mL/hr over 60 Minutes Intravenous Every 8 hours 12/06/16 0825     12/04/16 0630  piperacillin-tazobactam (ZOSYN) 3.375 g in dextrose 5 % 50 mL IVPB  Status:  Discontinued     3.375 g 12.5 mL/hr over 240 Minutes Intravenous Every 8 hours 12/04/16 0623 12/06/16 0643   12/03/16 2130  piperacillin-tazobactam (ZOSYN) IVPB 3.375 g  Status:  Discontinued     3.375 g 12.5 mL/hr over 240 Minutes Intravenous Every 8 hours 12/03/16 2125 12/04/16 0623   12/03/16 1915  piperacillin-tazobactam (ZOSYN) IVPB 3.375 g     3.375 g 100 mL/hr over 30 Minutes Intravenous  Once 12/03/16 1907 12/03/16 2005       Assessment/Plan Ruptured appendicitis - no abdominal  surgical history - CT scan showed perforated appendicitis with 2.9 x 5.8 x 5 cm RIGHT lower quadrant contained perforation/abscess - s/p IR drain 3/6, culture pending. 20cc/24hr purulent drainage - WBC trending down 11.6, afebrile  Rash - likely due to zosyn. Will stop zosyn and switch to cipro/flagyl. Add benadryl cream to help with itching  COPD - on O2 at home. Reorder home advair. Consider sleep study OP Recent pneumonia - earlier this year HTN - metoprolol Former smoker - quit earlier this year RIGHT lower lobe mass - stable from previous CT, will need further workup  ID - zosyn 3/5>>3/8, cipro/flagyl 3/8>> FEN - IVF, NPO except sips clears from the floor VTE - SCDs, heparin  Plan - Switch antibiotic to cipro/flagyl. Continue drain and follow culture. [place] for sips of clears from the floor. CBC/BMP in AM    LOS: 3 days    Jerrye Beavers , [facility] Surgery 12/06/2016, 8:26 AM Pager: [phone] Consults: [phone] Mon-Fri 7:00 am-4:30 pm Sat-Sun 7:00 am-11:30 am

## 2016-12-06 NOTE — Evaluation (Signed)
Physical Therapy Evaluation Patient Details Name: Alisha Fisher MRN: [id number] DOB: 02/15/1946 Today's Date: 12/06/2016   History of Present Illness  Alisha Fisher is a 71 y.o. female ,former smoker, with history of hypertension, COPD, who presented to ED 12/03/16 with 3 day history of right lower quadrant abdominal pain. Subsequent imaging revealed. Perforated appendicitis   Clinical Impression  The patient was assisted to the [facility], had an incontinent episode in recliner. CNA in tio further assist.Pt admitted with above diagnosis. Pt currently with functional limitations due to the deficits listed below (see PT Problem List). Pt will benefit from skilled PT to increase their independence and safety with mobility to allow discharge to the venue listed below.       Follow Up Recommendations Home health PT;Supervision/Assistance - 24 hour    Equipment Recommendations  Rolling walker with 5" wheels    Recommendations for Other Services       Precautions / Restrictions Precautions Precautions: Fall Precaution Comments: right drain, on oxygen      Mobility  Bed Mobility               General bed mobility comments: in recliner  Transfers Overall transfer level: Needs assistance Equipment used: Rolling walker (2 wheeled) Transfers: Sit to/from [facility] Sit to Stand: Min assist Stand pivot transfers: Min assist       General transfer comment: assist to rise and steady, transfer to [facility] with min assist. CNA with patient.  Ambulation/Gait                Stairs            Wheelchair Mobility    Modified Rankin (Stroke Patients Only)       Balance                                             Pertinent Vitals/Pain Pain Assessment: Faces Faces Pain Scale: Hurts little more Pain Descriptors / Indicators: Discomfort Pain Intervention(s): Monitored during session;Patient requesting pain meds-RN notified    Home Living  Family/patient expects to be discharged to:: Private residence Living Arrangements: Children Available Help at Discharge: Available PRN/intermittently;Family Type of Home: House Home Access: Stairs to enter   Technical brewer of Steps: 4 Home Layout: One level Home Equipment: None      Prior Function Level of Independence: Independent               Hand Dominance        Extremity/Trunk Assessment   Upper Extremity Assessment Upper Extremity Assessment: Overall WFL for tasks assessed    Lower Extremity Assessment Lower Extremity Assessment: Generalized weakness    Cervical / Trunk Assessment Cervical / Trunk Assessment: Normal  Communication   Communication: No difficulties  Cognition Arousal/Alertness: Awake/alert Behavior During Therapy: WFL for tasks assessed/performed Overall Cognitive Status: Within Functional Limits for tasks assessed                      General Comments      Exercises     Assessment/Plan    PT Assessment Patient needs continued PT services  PT Problem List Decreased activity tolerance;Decreased mobility;Decreased knowledge of precautions;Decreased knowledge of use of DME;Decreased safety awareness       PT Treatment Interventions DME instruction;Gait training;Stair training;Functional mobility training;Therapeutic activities;Therapeutic exercise;Patient/family education    PT Goals (Current goals can  be found in the Care Plan section)  Acute Rehab PT Goals Patient Stated Goal: to feel better. PT Goal Formulation: With patient Time For Goal Achievement: 12/20/16 Potential to Achieve Goals: Good    Frequency Min 3X/week   Barriers to discharge Decreased caregiver support      Co-evaluation               End of Session Equipment Utilized During Treatment: Oxygen Activity Tolerance: Patient tolerated treatment well Patient left: in chair;with nursing/sitter in room Nurse Communication: Mobility  status PT Visit Diagnosis: Pain;Unsteadiness on feet (R26.81) Pain - Right/Left: Right         Time: 8590-9311 PT Time Calculation (min) (ACUTE ONLY): 10 min   Charges:   PT Evaluation $PT Eval Low Complexity: 1 Procedure     PT G CodesMarcelino Freestone PT [phone]    12/06/2016, 4:27 PM

## 2016-12-06 NOTE — Progress Notes (Signed)
Nurse paged [facility] on call provider to make provider aware of patient having itchy rash all over body. Patient has red splotchy rash on chest, abdomen and back. Patient has red bumpy rash on arms and legs. Nurse gave patient Benadryl 12.'5mg'$  IV.

## 2016-12-07 LAB — CBC
HCT: 35.5 % — ABNORMAL LOW (ref 36.0–46.0)
Hemoglobin: 11.7 g/dL — ABNORMAL LOW (ref 12.0–15.0)
MCH: 30.5 pg (ref 26.0–34.0)
MCHC: 33 g/dL (ref 30.0–36.0)
MCV: 92.7 fL (ref 78.0–100.0)
Platelets: 286 10*3/uL (ref 150–400)
RBC: 3.83 MIL/uL — ABNORMAL LOW (ref 3.87–5.11)
RDW: 13.7 % (ref 11.5–15.5)
WBC: 12.3 10*3/uL — ABNORMAL HIGH (ref 4.0–10.5)

## 2016-12-07 LAB — BASIC METABOLIC PANEL
Anion gap: 8 (ref 5–15)
BUN: 16 mg/dL (ref 6–20)
CALCIUM: 8.5 mg/dL — AB (ref 8.9–10.3)
CO2: 24 mmol/L (ref 22–32)
CREATININE: 0.64 mg/dL (ref 0.44–1.00)
Chloride: 101 mmol/L (ref 101–111)
GFR calc non Af Amer: >60 mL/min (ref >60)
Glucose, Bld: 91 mg/dL (ref 65–99)
Potassium: 4.1 mmol/L (ref 3.5–5.1)
SODIUM: 133 mmol/L — AB (ref 135–145)

## 2016-12-07 MED ORDER — VANCOMYCIN HCL IN DEXTROSE 750-5 MG/150ML-% IV SOLN
750.0000 mg | Freq: Two times a day (BID) | INTRAVENOUS | Status: DC
  Administered 2016-12-07: 23:00:00 750 mg via INTRAVENOUS
  Filled 2016-12-07 (×2): qty 150

## 2016-12-07 MED ORDER — CIPROFLOXACIN IN D5W 400 MG/200ML IV SOLN
400.0000 mg | Freq: Two times a day (BID) | INTRAVENOUS | Status: DC
  Filled 2016-12-07 (×3): qty 200

## 2016-12-07 MED ORDER — VANCOMYCIN HCL 10 G IV SOLR
2000.0000 mg | Freq: Once | INTRAVENOUS | Status: AC
  Administered 2016-12-07: 2000 mg via INTRAVENOUS
  Filled 2016-12-07: qty 2000

## 2016-12-07 MED ORDER — LORAZEPAM 2 MG/ML IJ SOLN
0.5000 mg | Freq: Four times a day (QID) | INTRAMUSCULAR | Status: DC | PRN
  Administered 2016-12-07 – 2016-12-08 (×3): 0.5 mg via INTRAVENOUS
  Administered 2016-12-08 – 2016-12-18 (×15): 1 mg via INTRAVENOUS
  Filled 2016-12-07 (×20): qty 1

## 2016-12-07 MED ORDER — DOCUSATE SODIUM 100 MG PO CAPS
100.0000 mg | ORAL_CAPSULE | Freq: Two times a day (BID) | ORAL | Status: DC
  Administered 2016-12-07 – 2016-12-15 (×13): 100 mg via ORAL
  Filled 2016-12-07 (×17): qty 1

## 2016-12-07 NOTE — Progress Notes (Signed)
PT Cancellation Note  Patient Details Name: Alisha Fisher MRN: [id number] DOB: 1945-12-24   Cancelled Treatment:    Reason Eval/Treat Not Completed: Pain limiting ability to participate (itching )   Claretha Cooper 12/07/2016, 1:02 PM Tresa Endo PT [phone]

## 2016-12-07 NOTE — Progress Notes (Signed)
PT Cancellation Note  Patient Details Name: Alisha Fisher MRN: [id number] DOB: 11-25-1945   Cancelled Treatment:    Reason Eval/Treat Not Completed: Pain limiting ability to participate (itching )   Claretha Cooper 12/07/2016, 1:01 PM Tresa Endo PT [phone]

## 2016-12-07 NOTE — Progress Notes (Signed)
Patient ID: Alisha Fisher, female   DOB: 1946-09-15, 71 y.o.   MRN: [id number]  [facility] Surgery Progress Note     Subjective: Patient reports persistent abdominal pain and distension today. Due to rash she has not felt like getting out of bed so she has not ambulated much. She is passing flatus. No BM since admission. Feels like rash may be worse today despite stopping zosyn 24 hours ago. This is making her very anxious and she is requesting medication for anxiety. She has not yet received benadryl cream. Denies increased SOB.   Objective: Vital signs in last 24 hours: Temp:  [97.4 F (36.3 C)-98.4 F (36.9 C)] 98.4 F (36.9 C) (03/09 0540) Pulse Rate:  [69-84] 84 (03/09 0540) Resp:  [16] 16 (03/09 0540) BP: (119-147)/(53-80) 147/58 (03/09 0540) SpO2:  [96 %-98 %] 98 % (03/09 0540) Last BM Date: 12/02/16  Intake/Output from previous day: 03/08 0701 - 03/09 0700 In: 3275 [P.O.:660; I.V.:2100; IV Piggyback:500] Out: 17 [Drains:17] Intake/Output this shift: Total I/O In: 100 [P.O.:100] Out: 20 [Drains:20]  PE: Gen: Alert, NAD, anxious Card: RRR, no M/G/R heard Pulm: few scattered rhonchi, no wheezes,effort normal, on [place] O2 Abd: Soft, distended, +BS, no HSM, mild TTP lower abdomen L>R Skin: diffuse maculopapular rash on trunk and upper extremities, now also extending into groin and on her face as well  Lab Results:   Recent Labs  12/06/16 0458 12/07/16 0457  WBC 11.6* 12.3*  HGB 11.0* 11.7*  HCT 34.0* 35.5*  PLT 242 286   BMET  Recent Labs  12/06/16 0458 12/07/16 0457  NA 136 133*  K 4.2 4.1  CL 102 101  CO2 29 24  GLUCOSE 97 91  BUN 23* 16  CREATININE 0.71 0.64  CALCIUM 8.5* 8.5*   PT/INR  Recent Labs  12/04/16 1005  LABPROT 14.8  INR 1.15   CMP     Component Value Date/Time   NA 133 (L) 12/07/2016 0457   K 4.1 12/07/2016 0457   CL 101 12/07/2016 0457   CO2 24 12/07/2016 0457   GLUCOSE 91 12/07/2016 0457   BUN 16 12/07/2016 0457    CREATININE 0.64 12/07/2016 0457   CALCIUM 8.5 (L) 12/07/2016 0457   PROT 6.7 12/03/2016 1613   ALBUMIN 2.8 (L) 12/03/2016 1613   AST 19 12/03/2016 1613   ALT 13 (L) 12/03/2016 1613   ALKPHOS 98 12/03/2016 1613   BILITOT 0.7 12/03/2016 1613   GFRNONAA >60 12/07/2016 0457   GFRAA >60 12/07/2016 0457   Lipase     Component Value Date/Time   LIPASE 15 12/03/2016 1613       Studies/Results: Dg Chest Port 1 View  Result Date: 12/05/2016 CLINICAL DATA:  Weakness and shortness of breath EXAM: PORTABLE CHEST 1 VIEW COMPARISON:  11/19/2016 FINDINGS: Moderate cardiomegaly with central vascular congestion. There are small bilateral pleural effusions and hazy bibasilar atelectasis or infiltrate. No pneumothorax. Atherosclerosis. IMPRESSION: 1. Cardiomegaly with central vascular congestion and small bilateral effusions 2. Hazy bibasilar atelectasis or infiltrates. Electronically Signed   By: Donavan Foil M.D.   On: 12/05/2016 15:29    Anti-infectives: Anti-infectives    Start     Dose/Rate Route Frequency Ordered Stop   12/06/16 2200  ciprofloxacin (CIPRO) IVPB 400 mg     400 mg 200 mL/hr over 60 Minutes Intravenous Every 12 hours 12/06/16 1409     12/06/16 1400  piperacillin-tazobactam (ZOSYN) IVPB 3.375 g  Status:  Discontinued     3.375 g 12.5 mL/hr  over 240 Minutes Intravenous Every 8 hours 12/06/16 0643 12/06/16 0825   12/06/16 1000  ciprofloxacin (CIPRO) IVPB 200 mg  Status:  Discontinued     200 mg 100 mL/hr over 60 Minutes Intravenous Every 12 hours 12/06/16 0825 12/06/16 1409   12/06/16 1000  metroNIDAZOLE (FLAGYL) IVPB 500 mg     500 mg 100 mL/hr over 60 Minutes Intravenous Every 8 hours 12/06/16 0825     12/04/16 0630  piperacillin-tazobactam (ZOSYN) 3.375 g in dextrose 5 % 50 mL IVPB  Status:  Discontinued     3.375 g 12.5 mL/hr over 240 Minutes Intravenous Every 8 hours 12/04/16 0623 12/06/16 0643   12/03/16 2130  piperacillin-tazobactam (ZOSYN) IVPB 3.375 g  Status:   Discontinued     3.375 g 12.5 mL/hr over 240 Minutes Intravenous Every 8 hours 12/03/16 2125 12/04/16 0623   12/03/16 1915  piperacillin-tazobactam (ZOSYN) IVPB 3.375 g     3.375 g 100 mL/hr over 30 Minutes Intravenous  Once 12/03/16 1907 12/03/16 2005       Assessment/Plan Ruptured appendicitis - no abdominal surgical history - CT scan showed perforated appendicitis with 2.9 x 5.8 x 5 cm RIGHT lower quadrant contained perforation/abscess - WBC slightly up today 12.3, afebrile - drain with 17cc/24hr purulent drainage  - s/p IR drain 3/6, culture growing enterococcus faecalis  Susceptibility   Enterococcus faecalis    MIC    AMPICILLIN <=2 SENSITIVE "><=2 SENSITIVE  Sensitive    GENTAMICIN SYNERGY RESISTANT  Resistant    VANCOMYCIN 1 SENSITIVE  Sensitive     Rash - more diffuse today despite stopping zosyn 24 hours ago. Patient has also not received narcotics in 24 hours. Possible reaction to cipro too? Will d/c cipro, continue flagyl and start on vancomycin. T/c ID consult if rash does not improve.  COPD - on O2 at home. Continue O2 and home advair. 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 Continue drain. Encourage more ambulation to help with distension/ileus. CBC/BMP in AM   LOS: 4 days    Jerrye Beavers , [facility] 12/07/2016, 9:33 AM Pager: [phone] Consults: [phone] Mon-Fri 7:00 am-4:30 pm Sat-Sun 7:00 am-11:30 am

## 2016-12-07 NOTE — Progress Notes (Signed)
Pharmacy Antibiotic Note  Alisha Fisher is a 71 y.o. female presented to the ED on 12/03/16 with c/o RLQ pain.  Abd CT showed perforated appendicitis with contained perforation/abscess -- s/p IR drain on 12/04/16.  Zosyn was started for broad coverage.  Patient reported rash and itching on 12/05/16 and this was suspected to be related to zosyn.  Zosyn d/ced on 12/06/16 and changed to cipro and flagyl.  On 12/07/16, pt's rash appeared to be getting worse after cipro started.  Abscess culture from 3/6/18now back with enterococcus faecalis.  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   Plan: - vancomycin 2000 mg x1, then 750 mg IV q12h - monitor rash - will monitor renal function closely since pt is also on ketorolac - scr daily _______________________________  Height: '5\' 7"'$  (170.2 cm) Weight: 225 lb (102.1 kg) IBW/kg (Calculated) : 61.6  Temp (24hrs), Avg:98 F (36.7 C), Min:97.4 F (36.3 C), Max:98.4 F (36.9 C)   Recent Labs Lab 12/03/16 1613 12/03/16 2142 12/05/16 0500 12/06/16 0458 12/07/16 0457  WBC 22.3* 16.1* 14.9* 11.6* 12.3*  CREATININE 0.92  --  0.79 0.71 0.64    Estimated Creatinine Clearance: 82.7 mL/min (by C-G formula based on SCr of 0.64 mg/dL).    Allergies  Allergen Reactions  . Zosyn [Piperacillin Sod-Tazobactam So] Rash   Antimicrobials this admission:  3/6 zosyn >> 3/8 (developed rash and suspects zosyn) 3/8 cipro >> 3/9 (rash getting worse) 3/8 flagyl>> 3/9 vanc>>  Microbiology results:  3/6 abscess cx: enterococcus faecalis (= ampi, vanc)    Thank you for allowing pharmacy to be a part of this patient's care.  Lynelle Doctor 12/07/2016 10:49 AM

## 2016-12-08 ENCOUNTER — Inpatient Hospital Stay (HOSPITAL_COMMUNITY): Payer: Medicare Other

## 2016-12-08 LAB — BASIC METABOLIC PANEL
ANION GAP: 4 — AB (ref 5–15)
BUN: 10 mg/dL (ref 6–20)
CALCIUM: 8.2 mg/dL — AB (ref 8.9–10.3)
CO2: 26 mmol/L (ref 22–32)
Chloride: 103 mmol/L (ref 101–111)
Creatinine, Ser: 0.55 mg/dL (ref 0.44–1.00)
GFR calc Af Amer: >60 mL/min (ref >60)
GFR calc non Af Amer: >60 mL/min (ref >60)
GLUCOSE: 93 mg/dL (ref 65–99)
POTASSIUM: 3.9 mmol/L (ref 3.5–5.1)
Sodium: 133 mmol/L — ABNORMAL LOW (ref 135–145)

## 2016-12-08 LAB — CBC
HEMATOCRIT: 34 % — AB (ref 36.0–46.0)
Hemoglobin: 11.2 g/dL — ABNORMAL LOW (ref 12.0–15.0)
MCH: 30.4 pg (ref 26.0–34.0)
MCHC: 32.9 g/dL (ref 30.0–36.0)
MCV: 92.4 fL (ref 78.0–100.0)
Platelets: 343 10*3/uL (ref 150–400)
RBC: 3.68 MIL/uL — ABNORMAL LOW (ref 3.87–5.11)
RDW: 14 % (ref 11.5–15.5)
WBC: 15.8 10*3/uL — AB (ref 4.0–10.5)

## 2016-12-08 MED ORDER — IPRATROPIUM-ALBUTEROL 0.5-2.5 (3) MG/3ML IN SOLN
3.0000 mL | Freq: Three times a day (TID) | RESPIRATORY_TRACT | Status: DC
  Administered 2016-12-09 – 2016-12-26 (×50): 3 mL via RESPIRATORY_TRACT
  Filled 2016-12-08 (×51): qty 3

## 2016-12-08 MED ORDER — SODIUM CHLORIDE 0.9 % IV SOLN
1.0000 g | INTRAVENOUS | Status: DC
  Administered 2016-12-08 – 2016-12-19 (×10): 1 g via INTRAVENOUS
  Filled 2016-12-08 (×13): qty 1

## 2016-12-08 MED ORDER — IPRATROPIUM-ALBUTEROL 0.5-2.5 (3) MG/3ML IN SOLN
3.0000 mL | Freq: Four times a day (QID) | RESPIRATORY_TRACT | Status: DC
  Administered 2016-12-08 (×3): 3 mL via RESPIRATORY_TRACT
  Filled 2016-12-08 (×3): qty 3

## 2016-12-08 MED ORDER — VANCOMYCIN HCL IN DEXTROSE 750-5 MG/150ML-% IV SOLN
750.0000 mg | Freq: Two times a day (BID) | INTRAVENOUS | Status: DC
  Administered 2016-12-08 – 2016-12-10 (×4): 750 mg via INTRAVENOUS
  Filled 2016-12-08 (×4): qty 150

## 2016-12-08 NOTE — Progress Notes (Signed)
Patient ID: Alisha Fisher, female   DOB: 1946/09/11, 71 y.o.   MRN: [id number] [facility] Surgery Progress Note:   * No surgery found *  Subjective: Mental status is anxious.  Rash is main complaint Objective: Vital signs in last 24 hours: Temp:  [97.5 F (36.4 C)-98.9 F (37.2 C)] 98.4 F (36.9 C) (03/10 0439) Pulse Rate:  [65-87] 87 (03/10 0439) Resp:  [16-22] 22 (03/10 0439) BP: (133-162)/(59-65) 162/65 (03/10 0439) SpO2:  [92 %-98 %] 96 % (03/10 0439)  Intake/Output from previous day: 03/09 0701 - 03/10 0700 In: 4635 [P.O.:1180; I.V.:2300; IV Piggyback:1150] Out: 37 [Urine:2; Drains:35] Intake/Output this shift: Total I/O In: 120 [P.O.:120] Out: 1 [Urine:1]  Physical Exam: Work of breathing is not labored     Lab Results:  Results for orders placed or performed during the hospital encounter of 12/03/16 (from the past 48 hour(s))  CBC     Status: Abnormal   Collection Time: 12/07/16  4:57 AM  Result Value Ref Range   WBC 12.3 (H) 4.0 - 10.5 K/uL   RBC 3.83 (L) 3.87 - 5.11 MIL/uL   Hemoglobin 11.7 (L) 12.0 - 15.0 g/dL   HCT 35.5 (L) 36.0 - 46.0 %   MCV 92.7 78.0 - 100.0 fL   MCH 30.5 26.0 - 34.0 pg   MCHC 33.0 30.0 - 36.0 g/dL   RDW 13.7 11.5 - 15.5 %   Platelets 286 150 - 400 K/uL  Basic metabolic panel     Status: Abnormal   Collection Time: 12/07/16  4:57 AM  Result Value Ref Range   Sodium 133 (L) 135 - 145 mmol/L   Potassium 4.1 3.5 - 5.1 mmol/L   Chloride 101 101 - 111 mmol/L   CO2 24 22 - 32 mmol/L   Glucose, Bld 91 65 - 99 mg/dL   BUN 16 6 - 20 mg/dL   Creatinine, Ser 0.64 0.44 - 1.00 mg/dL   Calcium 8.5 (L) 8.9 - 10.3 mg/dL   GFR calc non Af Amer >60 >60 mL/min   GFR calc Af Amer >60 >60 mL/min    Comment: (NOTE) The eGFR has been calculated using the CKD EPI equation. This calculation has not been validated in all clinical situations. eGFR's persistently <60 mL/min signify possible Chronic Kidney Disease.    Anion gap 8 5 - 15  CBC      Status: Abnormal   Collection Time: 12/08/16  4:46 AM  Result Value Ref Range   WBC 15.8 (H) 4.0 - 10.5 K/uL    Comment: WHITE COUNT CONFIRMED ON SMEAR   RBC 3.68 (L) 3.87 - 5.11 MIL/uL   Hemoglobin 11.2 (L) 12.0 - 15.0 g/dL   HCT 34.0 (L) 36.0 - 46.0 %   MCV 92.4 78.0 - 100.0 fL   MCH 30.4 26.0 - 34.0 pg   MCHC 32.9 30.0 - 36.0 g/dL   RDW 14.0 11.5 - 15.5 %   Platelets 343 150 - 400 K/uL  Basic metabolic panel     Status: Abnormal   Collection Time: 12/08/16  4:46 AM  Result Value Ref Range   Sodium 133 (L) 135 - 145 mmol/L   Potassium 3.9 3.5 - 5.1 mmol/L   Chloride 103 101 - 111 mmol/L   CO2 26 22 - 32 mmol/L   Glucose, Bld 93 65 - 99 mg/dL   BUN 10 6 - 20 mg/dL   Creatinine, Ser 0.55 0.44 - 1.00 mg/dL   Calcium 8.2 (L) 8.9 - 10.3 mg/dL  GFR calc non Af Amer >60 >60 mL/min   GFR calc Af Amer >60 >60 mL/min    Comment: (NOTE) The eGFR has been calculated using the CKD EPI equation. This calculation has not been validated in all clinical situations. eGFR's persistently <60 mL/min signify possible Chronic Kidney Disease.    Anion gap 4 (L) 5 - 15    Radiology/Results: No results found.  Anti-infectives: Anti-infectives    Start     Dose/Rate Route Frequency Ordered Stop   12/07/16 2300  vancomycin (VANCOCIN) IVPB 750 mg/150 ml premix     750 mg 150 mL/hr over 60 Minutes Intravenous Every 12 hours 12/07/16 1103     12/07/16 2200  ciprofloxacin (CIPRO) IVPB 400 mg     400 mg 200 mL/hr over 60 Minutes Intravenous Every 12 hours 12/07/16 1122     12/07/16 1115  vancomycin (VANCOCIN) 2,000 mg in sodium chloride 0.9 % 500 mL IVPB     2,000 mg 250 mL/hr over 120 Minutes Intravenous  Once 12/07/16 1103 12/07/16 1440   12/06/16 2200  ciprofloxacin (CIPRO) IVPB 400 mg  Status:  Discontinued     400 mg 200 mL/hr over 60 Minutes Intravenous Every 12 hours 12/06/16 1409 12/07/16 1039   12/06/16 1400  piperacillin-tazobactam (ZOSYN) IVPB 3.375 g  Status:  Discontinued      3.375 g 12.5 mL/hr over 240 Minutes Intravenous Every 8 hours 12/06/16 0643 12/06/16 0825   12/06/16 1000  ciprofloxacin (CIPRO) IVPB 200 mg  Status:  Discontinued     200 mg 100 mL/hr over 60 Minutes Intravenous Every 12 hours 12/06/16 0825 12/06/16 1409   12/06/16 1000  metroNIDAZOLE (FLAGYL) IVPB 500 mg     500 mg 100 mL/hr over 60 Minutes Intravenous Every 8 hours 12/06/16 0825     12/04/16 0630  piperacillin-tazobactam (ZOSYN) 3.375 g in dextrose 5 % 50 mL IVPB  Status:  Discontinued     3.375 g 12.5 mL/hr over 240 Minutes Intravenous Every 8 hours 12/04/16 0623 12/06/16 0643   12/03/16 2130  piperacillin-tazobactam (ZOSYN) IVPB 3.375 g  Status:  Discontinued     3.375 g 12.5 mL/hr over 240 Minutes Intravenous Every 8 hours 12/03/16 2125 12/04/16 0623   12/03/16 1915  piperacillin-tazobactam (ZOSYN) IVPB 3.375 g     3.375 g 100 mL/hr over 30 Minutes Intravenous  Once 12/03/16 1907 12/03/16 2005      Assessment/Plan: Problem List: Patient Active Problem List   Diagnosis Date Noted  . Ruptured appendicitis 12/03/2016  . Obesity 05/25/2012  . Smoker 05/25/2012  . COPD (chronic obstructive pulmonary disease) ([place]) 05/25/2012  . Dyspnea 04/11/2012    Rash is main problem in addition to her ruptured appendix.  Could the rash be related to the mass in her chest?  Will get followup CT of chest.   * No surgery found *    LOS: 5 days   Matt B. Hassell Done, MD, [facility] [phone] beeper [phone]  12/08/2016 8:32 AM

## 2016-12-08 NOTE — Progress Notes (Addendum)
Pharmacy Antibiotic Note  Alisha Fisher is a 71 y.o. female presented to the ED on 12/03/16 with c/o RLQ pain.  Abd CT showed perforated appendicitis with contained perforation/abscess -- s/p IR drain on 12/04/16.  Zosyn was started for broad coverage.  Patient reported rash and itching on 12/05/16 and this was suspected to be related to zosyn.  Zosyn d/ced on 12/06/16 and changed to cipro and flagyl.  On 12/07/16, pt's rash appeared to be getting worse after cipro started.  Abscess culture from 3/6/18now back with enterococcus faecalis.  Vancomycin was added to cipro and flagyl.   3/10: By report, the rash seems to have worsened since starting Cipro and Flagyl. Discussed with Dr. Marlou Starks who agreed to try switching them to [facility].  Plan: - Cont Vancomycin '750mg'$  q12h. Infuse over a longer period just in case this is Red Man's Syndrome.  - Start Invanz 1g q24h - Spoke with RN, Irfe regarding the abx plan and to monitor rash today and through the night.  - Will monitor renal function closely since pt is also on ketorolac - scr daily _______________________________  Height: '5\' 7"'$  (170.2 cm) Weight: 225 lb (102.1 kg) IBW/kg (Calculated) : 61.6  Temp (24hrs), Avg:98.7 F (37.1 C), Min:98.4 F (36.9 C), Max:98.9 F (37.2 C)   Recent Labs Lab 12/03/16 1613 12/03/16 2142 12/05/16 0500 12/06/16 0458 12/07/16 0457 12/08/16 0446  WBC 22.3* 16.1* 14.9* 11.6* 12.3* 15.8*  CREATININE 0.92  --  0.79 0.71 0.64 0.55    Estimated Creatinine Clearance: 82.7 mL/min (by C-G formula based on SCr of 0.55 mg/dL).    Allergies  Allergen Reactions  . Ciprofloxacin Rash    Rash possibly caused by Cipro  . Zosyn [Piperacillin Sod-Tazobactam So] Rash    Has patient had a PCN reaction causing immediate rash, facial/tongue/throat swelling, SOB or lightheadedness with hypotension: No Has patient had a PCN reaction causing severe rash involving mucus membranes or skin necrosis: No Has patient had a PCN reaction that  required hospitalization: No Has patient had a PCN reaction occurring within the last 10 years: Yes If all of the above answers are "NO", then may proceed with Cephalosporin use.    Antimicrobials this admission:  3/6 Zosyn >> 3/8 3/8 Cipro >> 3/10 3/8 Flagyl >> 3/10 3/9 Vanc >> 3/10 Invanz >>  Dose adjustments this admission:   Microbiology results:  3/6 abscess cx: enterococcus faecalis (= ampi, vanc)  Thank you for allowing pharmacy to be a part of this patient's care.  Romeo Rabon, PharmD, pager [phone]. 12/08/2016,3:09 PM.

## 2016-12-09 LAB — CREATININE, SERUM
Creatinine, Ser: 0.57 mg/dL (ref 0.44–1.00)
GFR calc Af Amer: >60 mL/min (ref >60)
GFR calc non Af Amer: >60 mL/min (ref >60)

## 2016-12-09 NOTE — Progress Notes (Addendum)
Stopped by and talked with Alisha Fisher. Rash still head to toe and itchy but she thinks it is slowly improving.  Romeo Rabon, PharmD, pager [phone]. 12/09/2016,11:17 AM.

## 2016-12-09 NOTE — Progress Notes (Signed)
Patient ID: Alisha Fisher, female   DOB: 06-Jul-1946, 71 y.o.   MRN: [id number] [facility] Surgery Progress Note:   * No surgery found *  Subjective: Mental status is fairly clear.  Rash no worse. Objective: Vital signs in last 24 hours: Temp:  [97.8 F (36.6 C)-98.8 F (37.1 C)] 97.8 F (36.6 C) (03/11 0626) Pulse Rate:  [75-78] 78 (03/11 0626) Resp:  [16-18] 16 (03/11 0626) BP: (141-154)/(60-73) 154/73 (03/11 0626) SpO2:  [88 %-100 %] 97 % (03/11 0855)  Intake/Output from previous day: 03/10 0701 - 03/11 0700 In: 2770 [P.O.:480; I.V.:1890; IV Piggyback:400] Out: 302 [Urine:302] Intake/Output this shift: No intake/output data recorded.  Physical Exam: Work of breathing is about the same.  Seated and not labored.    Lab Results:  Results for orders placed or performed during the hospital encounter of 12/03/16 (from the past 48 hour(s))  CBC     Status: Abnormal   Collection Time: 12/08/16  4:46 AM  Result Value Ref Range   WBC 15.8 (H) 4.0 - 10.5 K/uL    Comment: WHITE COUNT CONFIRMED ON SMEAR   RBC 3.68 (L) 3.87 - 5.11 MIL/uL   Hemoglobin 11.2 (L) 12.0 - 15.0 g/dL   HCT 34.0 (L) 36.0 - 46.0 %   MCV 92.4 78.0 - 100.0 fL   MCH 30.4 26.0 - 34.0 pg   MCHC 32.9 30.0 - 36.0 g/dL   RDW 14.0 11.5 - 15.5 %   Platelets 343 150 - 400 K/uL  Basic metabolic panel     Status: Abnormal   Collection Time: 12/08/16  4:46 AM  Result Value Ref Range   Sodium 133 (L) 135 - 145 mmol/L   Potassium 3.9 3.5 - 5.1 mmol/L   Chloride 103 101 - 111 mmol/L   CO2 26 22 - 32 mmol/L   Glucose, Bld 93 65 - 99 mg/dL   BUN 10 6 - 20 mg/dL   Creatinine, Ser 0.55 0.44 - 1.00 mg/dL   Calcium 8.2 (L) 8.9 - 10.3 mg/dL   GFR calc non Af Amer >60 >60 mL/min   GFR calc Af Amer >60 >60 mL/min    Comment: (NOTE) The eGFR has been calculated using the CKD EPI equation. This calculation has not been validated in all clinical situations. eGFR's persistently <60 mL/min signify possible Chronic  Kidney Disease.    Anion gap 4 (L) 5 - 15  Creatinine, serum     Status: None   Collection Time: 12/09/16  4:20 AM  Result Value Ref Range   Creatinine, Ser 0.57 0.44 - 1.00 mg/dL   GFR calc non Af Amer >60 >60 mL/min   GFR calc Af Amer >60 >60 mL/min    Comment: (NOTE) The eGFR has been calculated using the CKD EPI equation. This calculation has not been validated in all clinical situations. eGFR's persistently <60 mL/min signify possible Chronic Kidney Disease.     Radiology/Results: Ct Chest Nodule Follow Up Low Dose W/o  Result Date: 12/08/2016 CLINICAL DATA:  71 year old female admitted with perforated appendicitis status post percutaneous drain placement for right lower quadrant abscess 4 days prior, presenting for follow-up of recently diagnosed right lower lobe lung mass. EXAM: CT CHEST WITHOUT CONTRAST TECHNIQUE: Multidetector CT imaging of the chest was performed following the standard protocol without IV contrast. COMPARISON:  11/14/2016 chest CT. 12/05/2016 chest radiograph. FINDINGS: Cardiovascular: Mild cardiomegaly. No residual significant pericardial effusion/ thickening. Left anterior descending, left circumflex and right coronary atherosclerosis. Atherosclerotic thoracic aorta. Stable ectasia of  the ascending thoracic aorta with maximum diameter 4.2 cm. Stable dilated main pulmonary artery (4.1 cm diameter). Mediastinum/Nodes: No discrete thyroid nodules. Unremarkable esophagus. New bilateral axillary lymphadenopathy measuring up to the 1.2 cm on the right (series 2/ image 34) and 1.2 cm on the left (series 2/image 38). Mild right paratracheal adenopathy measuring up to the 1.2 cm (series 2/ image 59), stable. Mildly enlarged 1.2 cm subcarinal node (series 2/ image 71), stable. No additional pathologically enlarged mediastinal nodes. Stable mild right hilar lymphadenopathy, poorly delineated on this noncontrast scan. No gross left hilar adenopathy on this noncontrast scan.  Lungs/Pleura: Small dependent bilateral pleural effusions, increased bilaterally since 11/14/2016. Associated mild smooth pleural thickening on the right without discrete pleural nodularity. No pneumothorax. Stable soft tissue density occlusion of the right lower lobe bronchus with complete right lower lobe atelectasis, suspicious for obstructing central right lower lobe lung mass, poorly delineated on this noncontrast chest CT, not appreciably changed since 11/14/2016. The right middle lobe bronchus also appears occluded with soft tissue density with worsened significant right middle lobe atelectasis. Mild centrilobular and paraseptal emphysema with mild diffuse bronchial wall thickening. Irregular 2.1 x 1.4 cm solid medial left upper lobe pulmonary nodule (series 5/ image 52), stable since 11/14/2016. Mild interlobular septal thickening throughout both lungs. Mild-to-moderate compressive atelectasis in the dependent left lower lobe. Upper abdomen: Relative hypertrophy of the lateral segment left liver lobe with diffuse liver surface irregularity, compatible with cirrhosis. Small volume ascites in the visualized upper abdomen. Musculoskeletal: No aggressive appearing focal osseous lesions. Mild thoracic spondylosis. IMPRESSION: 1. Persistent soft tissue density occlusion of the right lower and right middle lobe bronchi with complete right lower lobe atelectasis and worsening right middle lobe atelectasis. Primary bronchogenic carcinoma of the central right lower lung is the diagnosis of exclusion. 2. Stable right hilar, subcarinal and right paratracheal lymphadenopathy. New symmetric mild axillary lymphadenopathy. Nodal metastases not excluded. 3. Irregular 2.1 cm solid medial left upper lobe pulmonary nodule, stable since 11/14/2016, suspicious for contralateral pulmonary metastasis versus synchronous primary bronchogenic carcinoma . 4. Spectrum of findings suggestive of mild congestive heart failure including  mild cardiomegaly, small dependent bilateral pleural effusions and diffuse mild interlobular septal thickening characteristic of mild pulmonary edema. 5. Mild emphysema with mild diffuse bronchial wall thickening, suggesting COPD . 6. Cirrhosis.  Small volume ascites in the upper abdomen. 7. Aortic atherosclerosis.  Three-vessel coronary atherosclerosis. 8. 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 Recommend annual imaging followup by CTA or MRA. This recommendation follows 2010 ACCF/AHA/AATS/ACR/ASA/SCA/SCAI/SIR/STS/SVM Guidelines for the Diagnosis and Management of Patients with Thoracic Aortic Disease. Circulation. 2010; 121: R604-V409. 9. Stable prominently dilated main pulmonary artery, suggesting pulmonary arterial hypertension. Electronically Signed   By: Ilona Sorrel M.D.   On: 12/08/2016 11:30    Anti-infectives: Anti-infectives    Start     Dose/Rate Route Frequency Ordered Stop   12/08/16 1800  ertapenem (INVANZ) 1 g in sodium chloride 0.9 % 50 mL IVPB     1 g 100 mL/hr over 30 Minutes Intravenous Every 24 hours 12/08/16 1512     12/08/16 1600  vancomycin (VANCOCIN) IVPB 750 mg/150 ml premix     750 mg 100 mL/hr over 90 Minutes Intravenous Every 12 hours 12/08/16 1513     12/07/16 2300  vancomycin (VANCOCIN) IVPB 750 mg/150 ml premix  Status:  Discontinued     750 mg 150 mL/hr over 60 Minutes Intravenous Every 12 hours 12/07/16 1103 12/08/16 1513   12/07/16  2200  ciprofloxacin (CIPRO) IVPB 400 mg  Status:  Discontinued     400 mg 200 mL/hr over 60 Minutes Intravenous Every 12 hours 12/07/16 1122 12/08/16 1512   12/07/16 1115  vancomycin (VANCOCIN) 2,000 mg in sodium chloride 0.9 % 500 mL IVPB     2,000 mg 250 mL/hr over 120 Minutes Intravenous  Once 12/07/16 1103 12/07/16 1440   12/06/16 2200  ciprofloxacin (CIPRO) IVPB 400 mg  Status:  Discontinued     400 mg 200 mL/hr over 60 Minutes Intravenous Every 12 hours 12/06/16 1409 12/07/16 1039   12/06/16  1400  piperacillin-tazobactam (ZOSYN) IVPB 3.375 g  Status:  Discontinued     3.375 g 12.5 mL/hr over 240 Minutes Intravenous Every 8 hours 12/06/16 0643 12/06/16 0825   12/06/16 1000  ciprofloxacin (CIPRO) IVPB 200 mg  Status:  Discontinued     200 mg 100 mL/hr over 60 Minutes Intravenous Every 12 hours 12/06/16 0825 12/06/16 1409   12/06/16 1000  metroNIDAZOLE (FLAGYL) IVPB 500 mg  Status:  Discontinued     500 mg 100 mL/hr over 60 Minutes Intravenous Every 8 hours 12/06/16 0825 12/08/16 1512   12/04/16 0630  piperacillin-tazobactam (ZOSYN) 3.375 g in dextrose 5 % 50 mL IVPB  Status:  Discontinued     3.375 g 12.5 mL/hr over 240 Minutes Intravenous Every 8 hours 12/04/16 0623 12/06/16 0643   12/03/16 2130  piperacillin-tazobactam (ZOSYN) IVPB 3.375 g  Status:  Discontinued     3.375 g 12.5 mL/hr over 240 Minutes Intravenous Every 8 hours 12/03/16 2125 12/04/16 0623   12/03/16 1915  piperacillin-tazobactam (ZOSYN) IVPB 3.375 g     3.375 g 100 mL/hr over 30 Minutes Intravenous  Once 12/03/16 1907 12/03/16 2005      Assessment/Plan: Problem List: Patient Active Problem List   Diagnosis Date Noted  . Ruptured appendicitis 12/03/2016  . Obesity 05/25/2012  . Smoker 05/25/2012  . COPD (chronic obstructive pulmonary disease) ([place]) 05/25/2012  . Dyspnea 04/11/2012    Antibiotic changed to Invanz.  CT chest yesterday showed bronchogenic mass likely carcinoma.  Have discussed with Dr. Titus Mould and pulmonary will see tomorrow.   * No surgery found *    LOS: 6 days   Matt B. Hassell Done, MD, [facility] [phone] beeper [phone]  12/09/2016 12:12 PM

## 2016-12-10 ENCOUNTER — Encounter (HOSPITAL_COMMUNITY): Payer: Self-pay | Admitting: Pulmonary Disease

## 2016-12-10 DIAGNOSIS — J449 Chronic obstructive pulmonary disease, unspecified: Secondary | ICD-10-CM

## 2016-12-10 DIAGNOSIS — R918 Other nonspecific abnormal finding of lung field: Secondary | ICD-10-CM

## 2016-12-10 DIAGNOSIS — J90 Pleural effusion, not elsewhere classified: Secondary | ICD-10-CM

## 2016-12-10 LAB — BASIC METABOLIC PANEL
Anion gap: 3 — ABNORMAL LOW (ref 5–15)
BUN: 7 mg/dL (ref 6–20)
CALCIUM: 8.1 mg/dL — AB (ref 8.9–10.3)
CHLORIDE: 106 mmol/L (ref 101–111)
CO2: 27 mmol/L (ref 22–32)
CREATININE: 0.64 mg/dL (ref 0.44–1.00)
GFR calc non Af Amer: >60 mL/min (ref >60)
GLUCOSE: 95 mg/dL (ref 65–99)
Potassium: 4.2 mmol/L (ref 3.5–5.1)
Sodium: 136 mmol/L (ref 135–145)

## 2016-12-10 LAB — CREATININE, SERUM
CREATININE: 0.62 mg/dL (ref 0.44–1.00)
GFR calc Af Amer: >60 mL/min (ref >60)
GFR calc non Af Amer: >60 mL/min (ref >60)

## 2016-12-10 LAB — CBC
HEMATOCRIT: 33.7 % — AB (ref 36.0–46.0)
Hemoglobin: 11.5 g/dL — ABNORMAL LOW (ref 12.0–15.0)
MCH: 31 pg (ref 26.0–34.0)
MCHC: 34.1 g/dL (ref 30.0–36.0)
MCV: 90.8 fL (ref 78.0–100.0)
Platelets: 400 10*3/uL (ref 150–400)
RBC: 3.71 MIL/uL — ABNORMAL LOW (ref 3.87–5.11)
RDW: 14.3 % (ref 11.5–15.5)
WBC: 15.7 10*3/uL — ABNORMAL HIGH (ref 4.0–10.5)

## 2016-12-10 LAB — VANCOMYCIN, TROUGH: VANCOMYCIN TR: 11 ug/mL — AB (ref 15–20)

## 2016-12-10 MED ORDER — VANCOMYCIN HCL IN DEXTROSE 1-5 GM/200ML-% IV SOLN
1000.0000 mg | Freq: Two times a day (BID) | INTRAVENOUS | Status: DC
  Administered 2016-12-10 – 2016-12-13 (×7): 1000 mg via INTRAVENOUS
  Filled 2016-12-10 (×8): qty 200

## 2016-12-10 MED ORDER — BOOST / RESOURCE BREEZE PO LIQD
1.0000 | Freq: Three times a day (TID) | ORAL | Status: DC
  Administered 2016-12-10 – 2016-12-13 (×4): 1 via ORAL

## 2016-12-10 NOTE — Progress Notes (Signed)
Patient ID: Alisha Fisher, female   DOB: [birth year], 71 y.o.   MRN: [id number]  [facility] Surgery Progress Note     Subjective: Patient reports abdomen feeling a little better. She is less distended. Ambulated yesterday. +flatus. She also states that rash is improving, or at least not getting worse.  Continues to have a dry cough.  Objective: Vital signs in last 24 hours: Temp:  [97.5 F (36.4 C)-98.9 F (37.2 C)] 97.5 F (36.4 C) (03/12 0621) Pulse Rate:  [85-88] 85 (03/12 0621) Resp:  [16] 16 (03/12 0621) BP: (152-169)/(55-70) 169/55 (03/12 0621) SpO2:  [88 %-97 %] 96 % (03/12 0621) Last BM Date: 12/02/16  Intake/Output from previous day: 03/11 0701 - 03/12 0700 In: 3653.6 [P.O.:1330; I.V.:2037.1; IV Piggyback:286.5] Out: 10 [Drains:10] Intake/Output this shift: No intake/output data recorded.  PE: Gen: Alert, NAD, pleasant Card: RRR, no M/G/R heard Pulm: CTAB, no wheezes,effort normal Abd: Soft, mild distension (improved), +BS, no HSM, RLQ drain with minimal purulent drainage in bulb, mild tenderness around drain but no other areas Skin: diffuse maculopapular rash on trunk and upper extremities  Lab Results:   Recent Labs  12/08/16 0446 12/10/16 0420  WBC 15.8* 15.7*  HGB 11.2* 11.5*  HCT 34.0* 33.7*  PLT 343 400   BMET  Recent Labs  12/08/16 0446 12/09/16 0420 12/10/16 0420  NA 133*  --  136  K 3.9  --  4.2  CL 103  --  106  CO2 26  --  27  GLUCOSE 93  --  95  BUN 10  --  7  CREATININE 0.55 0.57 0.64  0.62  CALCIUM 8.2*  --  8.1*   PT/INR No results for input(s): LABPROT, INR in the last 72 hours. CMP     Component Value Date/Time   NA 136 12/10/2016 0420   K 4.2 12/10/2016 0420   CL 106 12/10/2016 0420   CO2 27 12/10/2016 0420   GLUCOSE 95 12/10/2016 0420   BUN 7 12/10/2016 0420   CREATININE 0.62 12/10/2016 0420   CREATININE 0.64 12/10/2016 0420   CALCIUM 8.1 (L) 12/10/2016 0420   PROT 6.7 12/03/2016 1613   ALBUMIN 2.8 (L)  12/03/2016 1613   AST 19 12/03/2016 1613   ALT 13 (L) 12/03/2016 1613   ALKPHOS 98 12/03/2016 1613   BILITOT 0.7 12/03/2016 1613   GFRNONAA >60 12/10/2016 0420   GFRNONAA >60 12/10/2016 0420   GFRAA >60 12/10/2016 0420   GFRAA >60 12/10/2016 0420   Lipase     Component Value Date/Time   LIPASE 15 12/03/2016 1613       Studies/Results: Ct Chest Nodule Follow Up Low Dose W/o  Result Date: 12/08/2016 CLINICAL DATA:  71 year old female admitted with perforated appendicitis status post percutaneous drain placement for right lower quadrant abscess 4 days prior, presenting for follow-up of recently diagnosed right lower lobe lung mass. EXAM: CT CHEST WITHOUT CONTRAST TECHNIQUE: Multidetector CT imaging of the chest was performed following the standard protocol without IV contrast. COMPARISON:  11/14/2016 chest CT. 12/05/2016 chest radiograph. FINDINGS: Cardiovascular: Mild cardiomegaly. No residual significant pericardial effusion/ thickening. Left anterior descending, left circumflex and right coronary atherosclerosis. Atherosclerotic thoracic aorta. Stable ectasia of the ascending thoracic aorta with maximum diameter 4.2 cm. Stable dilated main pulmonary artery (4.1 cm diameter). Mediastinum/Nodes: No discrete thyroid nodules. Unremarkable esophagus. New bilateral axillary lymphadenopathy measuring up to the 1.2 cm on the right (series 2/ image 34) and 1.2 cm on the left (series 2/image 38). Mild right  paratracheal adenopathy measuring up to the 1.2 cm (series 2/ image 59), stable. Mildly enlarged 1.2 cm subcarinal node (series 2/ image 71), stable. No additional pathologically enlarged mediastinal nodes. Stable mild right hilar lymphadenopathy, poorly delineated on this noncontrast scan. No gross left hilar adenopathy on this noncontrast scan. Lungs/Pleura: Small dependent bilateral pleural effusions, increased bilaterally since 11/14/2016. Associated mild smooth pleural thickening on the right  without discrete pleural nodularity. No pneumothorax. Stable soft tissue density occlusion of the right lower lobe bronchus with complete right lower lobe atelectasis, suspicious for obstructing central right lower lobe lung mass, poorly delineated on this noncontrast chest CT, not appreciably changed since 11/14/2016. The right middle lobe bronchus also appears occluded with soft tissue density with worsened significant right middle lobe atelectasis. Mild centrilobular and paraseptal emphysema with mild diffuse bronchial wall thickening. Irregular 2.1 x 1.4 cm solid medial left upper lobe pulmonary nodule (series 5/ image 52), stable since 11/14/2016. Mild interlobular septal thickening throughout both lungs. Mild-to-moderate compressive atelectasis in the dependent left lower lobe. Upper abdomen: Relative hypertrophy of the lateral segment left liver lobe with diffuse liver surface irregularity, compatible with cirrhosis. Small volume ascites in the visualized upper abdomen. Musculoskeletal: No aggressive appearing focal osseous lesions. Mild thoracic spondylosis. IMPRESSION: 1. Persistent soft tissue density occlusion of the right lower and right middle lobe bronchi with complete right lower lobe atelectasis and worsening right middle lobe atelectasis. Primary bronchogenic carcinoma of the central right lower lung is the diagnosis of exclusion. 2. Stable right hilar, subcarinal and right paratracheal lymphadenopathy. New symmetric mild axillary lymphadenopathy. Nodal metastases not excluded. 3. Irregular 2.1 cm solid medial left upper lobe pulmonary nodule, stable since 11/14/2016, suspicious for contralateral pulmonary metastasis versus synchronous primary bronchogenic carcinoma . 4. Spectrum of findings suggestive of mild congestive heart failure including mild cardiomegaly, small dependent bilateral pleural effusions and diffuse mild interlobular septal thickening characteristic of mild pulmonary edema. 5.  Mild emphysema with mild diffuse bronchial wall thickening, suggesting COPD . 6. Cirrhosis.  Small volume ascites in the upper abdomen. 7. Aortic atherosclerosis.  Three-vessel coronary atherosclerosis. 8. 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 Recommend annual imaging followup by CTA or MRA. This recommendation follows 2010 ACCF/AHA/AATS/ACR/ASA/SCA/SCAI/SIR/STS/SVM Guidelines for the Diagnosis and Management of Patients with Thoracic Aortic Disease. Circulation. 2010; 121: N235-T732. 9. Stable prominently dilated main pulmonary artery, suggesting pulmonary arterial hypertension. Electronically Signed   By: Ilona Sorrel M.D.   On: 12/08/2016 11:30    Anti-infectives: Anti-infectives    Start     Dose/Rate Route Frequency Ordered Stop   12/08/16 1800  ertapenem (INVANZ) 1 g in sodium chloride 0.9 % 50 mL IVPB     1 g 100 mL/hr over 30 Minutes Intravenous Every 24 hours 12/08/16 1512     12/08/16 1600  vancomycin (VANCOCIN) IVPB 750 mg/150 ml premix     750 mg 100 mL/hr over 90 Minutes Intravenous Every 12 hours 12/08/16 1513     12/07/16 2300  vancomycin (VANCOCIN) IVPB 750 mg/150 ml premix  Status:  Discontinued     750 mg 150 mL/hr over 60 Minutes Intravenous Every 12 hours 12/07/16 1103 12/08/16 1513   12/07/16 2200  ciprofloxacin (CIPRO) IVPB 400 mg  Status:  Discontinued     400 mg 200 mL/hr over 60 Minutes Intravenous Every 12 hours 12/07/16 1122 12/08/16 1512   12/07/16 1115  vancomycin (VANCOCIN) 2,000 mg in sodium chloride 0.9 % 500 mL IVPB     2,000  mg 250 mL/hr over 120 Minutes Intravenous  Once 12/07/16 1103 12/07/16 1440   12/06/16 2200  ciprofloxacin (CIPRO) IVPB 400 mg  Status:  Discontinued     400 mg 200 mL/hr over 60 Minutes Intravenous Every 12 hours 12/06/16 1409 12/07/16 1039   12/06/16 1400  piperacillin-tazobactam (ZOSYN) IVPB 3.375 g  Status:  Discontinued     3.375 g 12.5 mL/hr over 240 Minutes Intravenous Every 8 hours 12/06/16  0643 12/06/16 0825   12/06/16 1000  ciprofloxacin (CIPRO) IVPB 200 mg  Status:  Discontinued     200 mg 100 mL/hr over 60 Minutes Intravenous Every 12 hours 12/06/16 0825 12/06/16 1409   12/06/16 1000  metroNIDAZOLE (FLAGYL) IVPB 500 mg  Status:  Discontinued     500 mg 100 mL/hr over 60 Minutes Intravenous Every 8 hours 12/06/16 0825 12/08/16 1512   12/04/16 0630  piperacillin-tazobactam (ZOSYN) 3.375 g in dextrose 5 % 50 mL IVPB  Status:  Discontinued     3.375 g 12.5 mL/hr over 240 Minutes Intravenous Every 8 hours 12/04/16 0623 12/06/16 0643   12/03/16 2130  piperacillin-tazobactam (ZOSYN) IVPB 3.375 g  Status:  Discontinued     3.375 g 12.5 mL/hr over 240 Minutes Intravenous Every 8 hours 12/03/16 2125 12/04/16 0623   12/03/16 1915  piperacillin-tazobactam (ZOSYN) IVPB 3.375 g     3.375 g 100 mL/hr over 30 Minutes Intravenous  Once 12/03/16 1907 12/03/16 2005       Assessment/Plan Ruptured appendicitis - no abdominal surgical history - CT scan showed perforated appendicitis with 2.9 x 5.8 x 5 cm RIGHT lower quadrant contained perforation/abscess - drain with 10cc/24hr purulent drainage  - s/p IR drain 3/6, culture: enterococcus faecalis   Rash - Feinstein to see  COPD - on O2 at home. Continue O2 and home advair. Consider sleep study OP Recent pneumonia - earlier this year HTN - metoprolol Former smoker - quit earlier this year RIGHT lower lobe mass - Titus Mould to see  ID - zosyn 3/5>>3/8, cipro 3/8>>3/10, flagyl 3/8>>3/10, vanco 3/9>>, invanz 3/10>> FEN - IVF, NPO except sips clears from the floor VTE - SCDs, heparin  Plan - Abdomen appears improved from last week. Continue IV antibiotics (invanz and vancomycin) and drain. Continue to encourage ambulation. Titus Mould to see today regarding bronchogenic mass.   LOS: 7 days    Jerrye Beavers , [facility] 12/10/2016, 7:55 AM Pager: [phone] Consults: [phone] Mon-Fri 7:00 am-4:30  pm Sat-Sun 7:00 am-11:30 am

## 2016-12-10 NOTE — Consult Note (Signed)
Name: Alisha Fisher MRN: [id number] DOB: 1946-09-06    ADMISSION DATE:  12/03/2016 CONSULTATION DATE:  3/12  REFERRING MD :  gerkin  CHIEF COMPLAINT:  Lung mass  Brief 71 year old female w/ sig h/o COPD, obesity, probable OSA and recent hospitalization for PNA (feb 2018). Recently diagnosed lung mass during that stay. Admitted 3/5 w ruptured appendix. Right LLL lung mass again identified. PCCM asked to help w/ coordination of care.   SIGNIFICANT EVENTS  3/5 admitted w/ abd pain found to have ruptured appendix  3/6 Perc drain placed. Culture + Moderate Enterococcus Faecalis   STUDIES:  CT chest: 1. Persistent soft tissue density occlusion of the right lower and right middle lobe bronchi with complete right lower lobe atelectasis and worsening right middle lobe atelectasis. 2. Stable right hilar, subcarinal and right paratracheal lymphadenopathy. New symmetric mild axillary lymphadenopathy.  3. Irregular 2.1 cm solid medial left upper lobe pulmonary nodule, stable since 11/14/2016, suspicious for contralateral pulmonary metastasis versus synchronous primary bronchogenic carcinoma . 4. Mild emphysema with mild diffuse bronchial wall thickening, suggesting COPD . 5 Stable ectasia of the ascending thoracic aorta, maximum diameter 4.2 cm. 9. Stable prominently dilated main pulmonary artery, suggesting pulmonary arterial hypertension.   HPI :  This is a 71 year old female who was admitted on 3/5 w/ ruptured appendix and associated peritonitis/abscess. During diagnostic evaluation on admit was also found to have right Lower Lobe Lung mass. This was initially identified during a hospital stay at [facility] when she was admitted for a Pneumonia & initially identified via CT scan on 11/14/16. She apparently had follow up w/ Dr Alcide Clever this month but she would like to be seen here for evaluation. Pulmonary history: seen By Dr Chase Caller last  8/13.  -Her PFTs at that time showed mix of  obstructive and restrictive lung disease. 7/13: FEV1 was 1.1 (47% predicted) and TLC 75%, DLCO 63%. He felt that this was due to a mix of COPD and obesity.  -sleep apnea; not clear where, when or if this was actually tested. 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  Pulmonary was asked to see in-house to help coordinate a plan for her lung mass evaluation.   PAST MEDICAL HISTORY :   has a past medical history of HTN (hypertension); Pneumonia; and SOB (shortness of breath).  has a past surgical history that includes No past surgeries. Prior to Admission medications   Medication Sig Start Date End Date Taking? Authorizing Provider  ADVAIR DISKUS 250-50 MCG/DOSE AEPB Inhale 1 puff into the lungs Twice daily. 03/10/12  Yes Historical Provider, MD  albuterol (PROVENTIL HFA;VENTOLIN HFA) 108 (90 Base) MCG/ACT inhaler Inhale 2 puffs into the lungs every 6 (six) hours as needed for wheezing or shortness of breath.   Yes Historical Provider, MD  ALPRAZolam Duanne Moron) 0.5 MG tablet Take 0.5 mg by mouth 3 (three) times daily as needed for anxiety.  11/21/16  Yes Historical Provider, MD  amLODipine (NORVASC) 10 MG tablet Take 10 mg by mouth daily. 09/11/16  Yes Historical Provider, MD  Cholecalciferol (VITAMIN D PO) Take 4,000-5,000 Units by mouth daily.   Yes Historical Provider, MD  citalopram (CELEXA) 10 MG tablet Take 10 mg by mouth daily. 11/21/16  Yes Historical Provider, MD  losartan-hydrochlorothiazide (HYZAAR) 100-25 MG per tablet Take 1 tablet by mouth daily. 05/04/14  Yes Belva Crome, MD  metoprolol (LOPRESSOR) 100 MG tablet Take 1.5 tablets (150 mg total) by mouth daily. 07/14/13  Yes Belva Crome, MD  amLODipine (NORVASC) 5 MG tablet Take 1 tablet (5 mg total) by mouth daily. Patient not taking:  Reported on 12/03/2016 07/14/13   Belva Crome, MD   Allergies  Allergen Reactions  . Ciprofloxacin Rash    Rash possibly caused by Cipro?  . Flagyl [Metronidazole] Rash    Rash possibly caused by Flagyl?  Marland Kitchen Zosyn [Piperacillin Sod-Tazobactam So] Itching and Rash    Has patient had a PCN reaction causing immediate rash, facial/tongue/throat swelling, SOB or lightheadedness with hypotension: No Has patient had a PCN reaction causing severe rash involving mucus membranes or skin necrosis: No Has patient had a PCN reaction that required hospitalization: No Has patient had a PCN reaction occurring within the last 10 years: Yes If all of the above answers are "NO", then may proceed with Cephalosporin use.     FAMILY HISTORY:  family history includes COPD in her cousin. SOCIAL HISTORY:  reports that she has been smoking Cigarettes.  She has a 20.00 pack-year smoking history. She has never used smokeless tobacco. She reports that she drinks about 0.5 oz of alcohol per week . She reports that she does not use drugs.  Review of Systems:   Bolds are positive  Constitutional: weight loss, gain, night sweats, Fevers, chills, fatigue .  HEENT: headaches, Sore throat, sneezing, nasal congestion, post nasal drip, Difficulty swallowing, Tooth/dental problems, visual complaints visual changes, ear ache CV:  chest pain, radiates,Orthopnea, PND, swelling in lower extremities, dizziness, palpitations, syncope.  GI  heartburn, indigestion, abdominal pain, nausea, vomiting, diarrhea, change in bowel habits, loss of appetite, bloody stools.  Resp: cough, productive: , hemoptysis, dyspnea + stable but SOB w/ exertion , chest pain, pleuritic.  Skin: rash or itching or icterus GU: dysuria, change in color of urine, urgency or frequency. flank pain, hematuria  MS: joint pain or swelling. decreased range of motion  Psych: change in mood or affect. depression or anxiety.  Neuro: difficulty with speech, weakness,  numbness, ataxia   SUBJECTIVE:  No distress up in chair  VITAL SIGNS: Temp:  [97.5 F (36.4 C)-98.9 F (37.2 C)] 98.7 F (37.1 C) (03/12 1330) Pulse Rate:  [66-87] 66 (03/12 1330) Resp:  [16] 16 (03/12 1330) BP: (152-169)/(55-70) 152/61 (03/12 1330) SpO2:  [92 %-96 %] 92 % (03/12 1330)  PHYSICAL EXAMINATION: General:  Obese white female resting in chair. No distress. Emotional when talking about reason for visit  Neuro:  Awake, oriented. No focal def  HEENT:  MMM, neck is large Cardiovascular:  RRR, no MRG Lungs:  Clear and w/out accessory use  Abdomen:  Obese tender to touch. RLQ perc drain w/ minimal purulent dc  Musculoskeletal:  Equal st and bulk Skin:  Diffuse raised rash extending over thorax and extremities.    Recent Labs Lab 12/07/16 0457 12/08/16 0446 12/09/16 0420 12/10/16 0420  NA 133* 133*  --  136  K 4.1 3.9  --  4.2  CL 101 103  --  106  CO2 24 26  --  27  BUN 16 10  --  7  CREATININE 0.64 0.55 0.57 0.64  0.62  GLUCOSE 91 93  --  95    Recent Labs Lab 12/07/16 0457 12/08/16 0446 12/10/16 0420  HGB 11.7* 11.2* 11.5*  HCT 35.5* 34.0* 33.7*  WBC 12.3* 15.8* 15.7*  PLT 286 343 400   No results found.  ASSESSMENT / PLAN:  Lung mass w/ post obstructive atelectasis involving the RML and RLL Left upper lobe nodule Paratracheal, axillary and subcarinal adenopathy.  COPD  Obesity  Probable OSA.  Tobacco abuse   discussion -highly concerning for pulmonary malignancy w/ possible LN involvement.  -needs to recover from current surgery but will need tissue biospy to determine next step of care. Her underlying pulmonary pathologies such as her COPD and OSA make her a higher risk for bronchoscopy.   Plan Cont current abx Cont advair Smoking cessation  Will need EBUS as well as direct airway assessment for tissue culture.  Ideally she should be recovered from this current procedure and have OSA formally evaluated Dr Ashok Cordia to follow re: timing of  procedure and recommended timing of f/u.   perf appendix Plan  Per surgical team   htn Plan Lopressor   Drug rash.  ? Cipro. She thinks better Plan Monitor.     12/10/2016, 3:47 PM

## 2016-12-10 NOTE — Care Management Important Message (Signed)
Important Message  Patient Details  Name: Jessah Danser MRN: [id number] Date of Birth: 05-Jun-1946   Medicare Important Message Given:  Yes    Kerin Salen 12/10/2016, 12:36 [place] Message  Patient Details  Name: Najae Filsaime MRN: [id number] Date of Birth: 09-Oct-1945   Medicare Important Message Given:  Yes    Kerin Salen 12/10/2016, 12:36 PM

## 2016-12-10 NOTE — Progress Notes (Signed)
Pharmacy Antibiotic Note  Alisha Fisher is a 71 y.o. female presented to the ED on 12/03/16 with c/o RLQ pain.  Abd CT showed perforated appendicitis with contained perforation/abscess -- s/p IR drain on 12/04/16.  Zosyn was started for broad coverage.  Patient reported rash and itching on 12/05/16 and this was suspected to be related to zosyn.  Zosyn d/ced on 12/06/16 and changed to cipro and flagyl.  On 12/07/16, pt's rash appeared to be getting worse after cipro started.  Abscess culture from 12/04/16 now back with Enterococcus faecalis.  Vancomycin was added to cipro and flagyl.   Significant events: 3/10: By report, the rash seems to have worsened since starting Cipro and Flagyl. Discussed with Dr. Marlou Starks who agreed to try switching them to [facility]. 3/12: patient reported to surgery that rash is improving  Plan: - Increase Vancomycin to 1g q12h in response to low trough level of 11 mcg/ml this morning (goal VT 15-20 mcg/ml). Infusing over a longer period just in case rash is associated with Red Man's Syndrome.  - Continue Invanz 1g q24h - Spoke with RN, Irfe regarding the abx plan and to monitor rash today and through the night.  - Will monitor renal function closely since pt is also on ketorolac (will end today). SCr has been stable. SCr daily for now. _______________________________  Height: '5\' 7"'$  (170.2 cm) Weight: 225 lb (102.1 kg) IBW/kg (Calculated) : 61.6  Temp (24hrs), Avg:98.2 F (36.8 C), Min:97.5 F (36.4 C), Max:98.9 F (37.2 C)   Recent Labs Lab 12/05/16 0500 12/06/16 0458 12/07/16 0457 12/08/16 0446 12/09/16 0420 12/10/16 0420  WBC 14.9* 11.6* 12.3* 15.8*  --  15.7*  CREATININE 0.79 0.71 0.64 0.55 0.57 0.64  0.62  VANCOTROUGH  --   --   --   --   --  11*    Estimated Creatinine Clearance: 82.7 mL/min (by C-G formula based on SCr of 0.62 mg/dL).    Allergies  Allergen Reactions  . Ciprofloxacin Rash    Rash possibly caused by Cipro?  . Flagyl [Metronidazole] Rash   Rash possibly caused by Flagyl?  Marland Kitchen Zosyn [Piperacillin Sod-Tazobactam So] Itching and Rash    Has patient had a PCN reaction causing immediate rash, facial/tongue/throat swelling, SOB or lightheadedness with hypotension: No Has patient had a PCN reaction causing severe rash involving mucus membranes or skin necrosis: No Has patient had a PCN reaction that required hospitalization: No Has patient had a PCN reaction occurring within the last 10 years: Yes If all of the above answers are "NO", then may proceed with Cephalosporin use.    Antimicrobials this admission:  3/6 Zosyn >> 3/8 3/8 Cipro >> 3/10 3/8 Flagyl >> 3/10 3/9 Vanc >> 3/10 Invanz >>  Dose adjustments this admission:  3/12 VT = 11 mcg/ml on 750 mg q12h  Microbiology results:  3/6 abscess cx: Enterococcus faecalis (sensitive to ampi, vanc), rare yeast  Thank you for allowing pharmacy to be a part of this patient's care.  Hershal Coria, PharmD, BCPS Pager: [phone] 12/10/2016 11:04 AM

## 2016-12-10 NOTE — Progress Notes (Addendum)
Initial Nutrition Assessment  DOCUMENTATION CODES:   Obesity unspecified  INTERVENTION:   Boost Breeze po TID, each supplement provides 250 kcal and 9 grams of protein  Consider nutrition support if pt not eating in next 1-2 days.   NUTRITION DIAGNOSIS:   Inadequate oral intake related to inability to eat as evidenced by NPO status.  GOAL:   Patient will meet greater than or equal to 90% of their needs  MONITOR:   Diet advancement, Weight trends, Labs  REASON FOR ASSESSMENT:   NPO/Clear Liquid Diet    ASSESSMENT:   71 y.o. female presented to the ED on 12/03/16 with c/o RLQ pain.  Abd CT showed perforated appendicitis with contained perforation/abscess -- s/p IR drain on 12/04/16.  Zosyn was started for broad coverage.  Patient reported rash and itching on 12/05/16 and this was suspected to be related to zosyn.  Zosyn d/ced on 12/06/16 and changed to cipro and flagyl.  On 12/07/16, pt's rash appeared to be getting worse after cipro started.  Abscess culture from 3/6/18now back with enterococcus faecalis.  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   Met with pt in room today. Pt complaining of abdominal pain today. Pt reports poor appetite for 2 days pta but reports that she was hospitalized for PNA two weeks ago and did not eat well during this admit. Pt is unsure if she has lost any weight. No recent weight history in the chart for this patient. Pt is afraid to eat r/t having to have bowel movement and having severe pain. Pt has been drinking liquids and having broth per RN. RD will order Boost Breeze. Pt now 7 days without adequate nutrition. RD spoke with patient about importance of adequate protein intake for healing and to preserve lean muscle mass. Pt to possibly have appendectomy in the future. Consider nutrition support if pt not eating in next 1-2 days.   Medications reviewed and include: colace, heparin, protonix, vancomycin, NaCl with dextrose,  hydromorphone  Labs reviewed: Ca 8.1(L) Wbc- 15.7(H)  Nutrition-Focused physical exam completed. Findings are no fat depletion, no muscle depletion, and no edema.   Diet Order:  Diet NPO time specified Except for: Ice Chips, Sips with Meds, Other (See Comments)  Skin:  Reviewed, no issues  Last BM:  3/4  Height:   Ht Readings from Last 1 Encounters:  12/03/16 '5\' 7"'$  (1.702 m)    Weight:   Wt Readings from Last 1 Encounters:  12/03/16 225 lb (102.1 kg)    Ideal Body Weight:  61.3 kg  BMI:  Body mass index is 35.24 kg/m.  Estimated Nutritional Needs:   Kcal:  1700-2000kcal/day   Protein:  81-104g/day   Fluid:  >1.7L  EDUCATION NEEDS:   No education needs identified at this time  Koleen Distance, RD, LDN Pager #[phone]

## 2016-12-10 NOTE — Progress Notes (Signed)
Physical Therapy Treatment Patient Details Name: Alisha Fisher MRN: [id number] DOB: [birth year] Today's Date: 12/10/2016    History of Present Illness Mallissa Lorenzen is a 71 y.o. female ,former smoker, with history of hypertension, COPD, who presented to ED 12/03/16 with 3 day history of right lower quadrant abdominal pain. Subsequent imaging revealed. Perforated appendicitis     PT Comments    Assisted OOB to amb to bathroom then a short distance in hallway.  Pt on home O2 at 2 lts but had to increase to 3 lts during activity with avg sats 92%.  Limited by dyspnea and fatigue.   Follow Up Recommendations  Home health PT;Supervision/Assistance - 24 hour     Equipment Recommendations  Rolling walker with 5" wheels    Recommendations for Other Services       Precautions / Restrictions Precautions Precautions: Fall Precaution Comments: right drain, on home oxygen Restrictions Weight Bearing Restrictions: No    Mobility  Bed Mobility Overal bed mobility: Needs Assistance Bed Mobility: Supine to Sit     Supine to sit: Mod assist     General bed mobility comments: Mod Assist for upper body due to ABD pain and increased time to scoot to EOB  Transfers Overall transfer level: Needs assistance Equipment used: Rolling walker (2 wheeled) Transfers: Sit to/from Stand Sit to Stand: Min guard;Supervision Stand pivot transfers: Min guard;Supervision       General transfer comment: assist to rise and increased time complete turns.    Ambulation/Gait Ambulation/Gait assistance: Min guard Ambulation Distance (Feet): 42 Feet Assistive device: Rolling walker (2 wheeled) Gait Pattern/deviations: Step-to pattern;Step-through pattern;Trunk flexed Gait velocity: decreased   General Gait Details: limited distance due to 3/4 DOE and x 3 standing rest breaks.  Increased oxygen to 3 lts during activity.  Avg sats 92% with HR 87.    Stairs            Wheelchair Mobility     Modified Rankin (Stroke Patients Only)       Balance                                    Cognition Arousal/Alertness: Awake/alert Behavior During Therapy: WFL for tasks assessed/performed Overall Cognitive Status: Within Functional Limits for tasks assessed                      Exercises      General Comments        Pertinent Vitals/Pain Faces Pain Scale: Hurts little more Pain Location: R side near drain Pain Descriptors / Indicators: Discomfort;Grimacing Pain Intervention(s): Monitored during session    Home Living                      Prior Function            PT Goals (current goals can now be found in the care plan section) Progress towards PT goals: Progressing toward goals    Frequency    Min 3X/week      PT Plan Current plan remains appropriate    Co-evaluation             End of Session Equipment Utilized During Treatment: Oxygen Activity Tolerance: Patient limited by fatigue Patient left: in chair;with call bell/phone within reach Nurse Communication: Mobility status PT Visit Diagnosis: Pain;Unsteadiness on feet (R26.81)     Time: 1005-1030 PT Time Calculation (min) (ACUTE ONLY):  25 min  Charges:  $Gait Training: 8-22 mins $Therapeutic Activity: 8-22 mins                    G Codes:       Rica Koyanagi  PTA [facility]  Acute  Rehab Pager      [phone]

## 2016-12-10 NOTE — Care Management Note (Signed)
Case Management Note  Patient Details  Name: Rubye Strohmeyer MRN: [id number] Date of Birth: 09/23/1946  Subjective/Objective:           71 yo admitted with Ruptured Appendicitis         Action/Plan: Pt from home alone. Unsure if pt will need to DC home with drain. PT recommendations gone over with pt. Pt offered choice for home health services. Pt states she receives home 02 already from [facility] and would like to use them. Pt requesting 4 wheeled walker with seat and 3in1. Orders received for DME and will need MD orders for HHPT and HHRN. [facility] rep contacted for referral and DME needs. No other CM needs communicated. CM will continue to follow.  Expected Discharge Date:                  Expected Discharge Plan:  [place]  In-House Referral:     Discharge planning Services  CM Consult  Post Acute Care Choice:  Home Health Choice offered to:  Patient  DME Arranged:  3-N-1, Walker rolling with seat DME Agency:  [place]:  RN, PT [facility] Agency:  [place]  Status of Service:  In process, will continue to follow  If discussed at Long Length of Stay Meetings, dates discussed:    Additional CommentsLynnell Catalan, RN 12/10/2016, 11:13 AM  [phone]

## 2016-12-11 ENCOUNTER — Inpatient Hospital Stay (HOSPITAL_COMMUNITY): Payer: Medicare Other

## 2016-12-11 LAB — CBC
HEMATOCRIT: 34.8 % — AB (ref 36.0–46.0)
HEMOGLOBIN: 11.5 g/dL — AB (ref 12.0–15.0)
MCH: 30.3 pg (ref 26.0–34.0)
MCHC: 33 g/dL (ref 30.0–36.0)
MCV: 91.6 fL (ref 78.0–100.0)
Platelets: 421 10*3/uL — ABNORMAL HIGH (ref 150–400)
RBC: 3.8 MIL/uL — ABNORMAL LOW (ref 3.87–5.11)
RDW: 14.3 % (ref 11.5–15.5)
WBC: 15 10*3/uL — ABNORMAL HIGH (ref 4.0–10.5)

## 2016-12-11 LAB — CREATININE, SERUM
Creatinine, Ser: 0.57 mg/dL (ref 0.44–1.00)
GFR calc Af Amer: >60 mL/min (ref >60)

## 2016-12-11 MED ORDER — HYDRALAZINE HCL 20 MG/ML IJ SOLN
5.0000 mg | Freq: Four times a day (QID) | INTRAMUSCULAR | Status: DC | PRN
  Administered 2016-12-12 – 2016-12-13 (×3): 5 mg via INTRAVENOUS
  Filled 2016-12-11 (×3): qty 1

## 2016-12-11 NOTE — Progress Notes (Signed)
Patient ID: Alisha Fisher, female   DOB: 16-Sep-1946, 71 y.o.   MRN: [id number]  [facility] ([place]) Surgery Progress Note     Subjective: Patient slept well last night. Denies any current abdominal pain. She still has not had a BM since admission. States that yesterday was the first day she had return in appetite. She has been tolerating clear liquids. Scheduled for thoracentesis of pleural effusion today.  Objective: Vital signs in last 24 hours: Temp:  [98.2 F (36.8 C)-98.7 F (37.1 C)] 98.5 F (36.9 C) (03/13 0642) Pulse Rate:  [66-83] 83 (03/13 0642) Resp:  [16-18] 16 (03/13 0642) BP: (152-179)/(61-83) 179/83 (03/13 0642) SpO2:  [92 %-97 %] 97 % (03/13 0642) Last BM Date: 12/04/16  Intake/Output from previous day: 03/12 0701 - 03/13 0700 In: 3276.7 [P.O.:360; I.V.:2466.7; IV Piggyback:450] Out: 15 [Drains:15] Intake/Output this shift: No intake/output data recorded.  PE: Gen: Alert, NAD, pleasant Card: RRR, no M/G/R heard Pulm: CTAB, no wheezes,effort normal Abd: Soft, mild distension, +BS, no HSM, RLQ drain with minimal serous drainage in bulb, no tenderness to palpation Skin: diffuse maculopapular rash on trunk, upper extremities, and face improved from yesterday  Lab Results:   Recent Labs  12/10/16 0420 12/11/16 0458  WBC 15.7* 15.0*  HGB 11.5* 11.5*  HCT 33.7* 34.8*  PLT 400 421*   BMET  Recent Labs  12/10/16 0420 12/11/16 0458  NA 136  --   K 4.2  --   CL 106  --   CO2 27  --   GLUCOSE 95  --   BUN 7  --   CREATININE 0.64  0.62 0.57  CALCIUM 8.1*  --    PT/INR No results for input(s): LABPROT, INR in the last 72 hours. CMP     Component Value Date/Time   NA 136 12/10/2016 0420   K 4.2 12/10/2016 0420   CL 106 12/10/2016 0420   CO2 27 12/10/2016 0420   GLUCOSE 95 12/10/2016 0420   BUN 7 12/10/2016 0420   CREATININE 0.57 12/11/2016 0458   CALCIUM 8.1 (L) 12/10/2016 0420   PROT 6.7 12/03/2016 1613   ALBUMIN 2.8 (L) 12/03/2016 1613   AST 19 12/03/2016 1613   ALT 13 (L) 12/03/2016 1613   ALKPHOS 98 12/03/2016 1613   BILITOT 0.7 12/03/2016 1613   GFRNONAA >60 12/11/2016 0458   GFRAA >60 12/11/2016 0458   Lipase     Component Value Date/Time   LIPASE 15 12/03/2016 1613       Studies/Results: No results found.  Anti-infectives: Anti-infectives    Start     Dose/Rate Route Frequency Ordered Stop   12/10/16 1400  vancomycin (VANCOCIN) IVPB 1000 mg/200 mL premix     1,000 mg 100 mL/hr over 120 Minutes Intravenous Every 12 hours 12/10/16 1059     12/08/16 1800  ertapenem (INVANZ) 1 g in sodium chloride 0.9 % 50 mL IVPB     1 g 100 mL/hr over 30 Minutes Intravenous Every 24 hours 12/08/16 1512     12/08/16 1600  vancomycin (VANCOCIN) IVPB 750 mg/150 ml premix  Status:  Discontinued     750 mg 100 mL/hr over 90 Minutes Intravenous Every 12 hours 12/08/16 1513 12/10/16 1059   12/07/16 2300  vancomycin (VANCOCIN) IVPB 750 mg/150 ml premix  Status:  Discontinued     750 mg 150 mL/hr over 60 Minutes Intravenous Every 12 hours 12/07/16 1103 12/08/16 1513   12/07/16 2200  ciprofloxacin (CIPRO) IVPB 400 mg  Status:  Discontinued  400 mg 200 mL/hr over 60 Minutes Intravenous Every 12 hours 12/07/16 1122 12/08/16 1512   12/07/16 1115  vancomycin (VANCOCIN) 2,000 mg in sodium chloride 0.9 % 500 mL IVPB     2,000 mg 250 mL/hr over 120 Minutes Intravenous  Once 12/07/16 1103 12/07/16 1440   12/06/16 2200  ciprofloxacin (CIPRO) IVPB 400 mg  Status:  Discontinued     400 mg 200 mL/hr over 60 Minutes Intravenous Every 12 hours 12/06/16 1409 12/07/16 1039   12/06/16 1400  piperacillin-tazobactam (ZOSYN) IVPB 3.375 g  Status:  Discontinued     3.375 g 12.5 mL/hr over 240 Minutes Intravenous Every 8 hours 12/06/16 0643 12/06/16 0825   12/06/16 1000  ciprofloxacin (CIPRO) IVPB 200 mg  Status:  Discontinued     200 mg 100 mL/hr over 60 Minutes Intravenous Every 12 hours 12/06/16 0825 12/06/16 1409   12/06/16 1000   metroNIDAZOLE (FLAGYL) IVPB 500 mg  Status:  Discontinued     500 mg 100 mL/hr over 60 Minutes Intravenous Every 8 hours 12/06/16 0825 12/08/16 1512   12/04/16 0630  piperacillin-tazobactam (ZOSYN) 3.375 g in dextrose 5 % 50 mL IVPB  Status:  Discontinued     3.375 g 12.5 mL/hr over 240 Minutes Intravenous Every 8 hours 12/04/16 0623 12/06/16 0643   12/03/16 2130  piperacillin-tazobactam (ZOSYN) IVPB 3.375 g  Status:  Discontinued     3.375 g 12.5 mL/hr over 240 Minutes Intravenous Every 8 hours 12/03/16 2125 12/04/16 0623   12/03/16 1915  piperacillin-tazobactam (ZOSYN) IVPB 3.375 g     3.375 g 100 mL/hr over 30 Minutes Intravenous  Once 12/03/16 1907 12/03/16 2005       Assessment/Plan Ruptured appendicitis - no abdominal surgical history - CT scan showed perforated appendicitis with 2.9 x 5.8 x 5 cm RIGHT lower quadrant contained perforation/abscess - drain with 15cc/24hr serous drainage  - s/p IR drain 3/6, culture: enterococcus faecalis   Rash - improving  Bilateral pleural effusions - thoracentesis planned for today, follow cultures COPD - on O2 at home. on Dulera 2 puffs twice a day. Patient will need repeat pulmonary function testing as an outpatient as well as screening for alpha-1 antitrypsin deficiency RIGHT lower lobe mass/ Right-sided endobronchial obstruction - per CCM, If pleural fluid is unrevealing or cannot be obtained the next logical step would be a bronchoscopy with airway inspection and endobronchial ultrasound-guided fine-needle aspiration for appropriate mediastinal lymph node staging Recent pneumonia - earlier this year HTN - metoprolol Former smoker - quit earlier this year  ID - zosyn 3/5>>3/8, cipro 3/8>>3/10, flagyl 3/8>>3/10, vanco 3/9>>, invanz 3/10>> FEN - IVF, full liquid diet after procedure VTE - SCDs, heparin  Plan - Abdominal pain improving and drain now with serous drainage. Continue drain and IV antibiotics. [place] for full liquid diet after  thoracentesis.    LOS: 8 days    Jerrye Beavers , [facility] Surgery 12/11/2016, 8:31 AM Pager: [phone] Consults: [phone] Mon-Fri 7:00 am-4:30 pm Sat-Sun 7:00 am-11:30 am

## 2016-12-11 NOTE — Progress Notes (Signed)
Patient requesting full bath every time she has an episode of urine dribbling or incontinence/leaking. Nurse tech Nadyne Coombes and myself explained she had a full bath this am, we can do pericare and clean her up, but we arent able to do full baths every time of incontinence,patient also able to ambulate to bathroom with assistance but was not calling out to go to bathroom, the rashes and dry skin would continue to get worse as well. Pericare, adequately drying, and bed changed every episode. Full bath performed first thing this am.

## 2016-12-11 NOTE — Progress Notes (Addendum)
Patient ID: Alisha Fisher, female   DOB: Mar 19, 1946, 71 y.o.   MRN: [id number] Pt presented to [place] dept today for thoracentesis; on limited US chest there is only a very small right effusion noted which appears loculated; details/risks of procedure d/w pt. At this time pt wishes to hold off on thoracentesis until more fluid is present to decrease risk of complications.She would also like to receive IV ativan before coming back to [place] for procedure. Pt afebrile; RLQ drain intact, output 5 cc; fluid cx- enterococcus. WBC 15(15.7), hgb stable, last creat ok; rec f/u CT A/P with IV contrast next 24-48 hrs to reassess RLQ abscess.   [facility]

## 2016-12-12 LAB — AEROBIC/ANAEROBIC CULTURE (SURGICAL/DEEP WOUND)

## 2016-12-12 LAB — CBC
HCT: 35.9 % — ABNORMAL LOW (ref 36.0–46.0)
HEMOGLOBIN: 11.7 g/dL — AB (ref 12.0–15.0)
MCH: 30.7 pg (ref 26.0–34.0)
MCHC: 32.6 g/dL (ref 30.0–36.0)
MCV: 94.2 fL (ref 78.0–100.0)
Platelets: 405 10*3/uL — ABNORMAL HIGH (ref 150–400)
RBC: 3.81 MIL/uL — ABNORMAL LOW (ref 3.87–5.11)
RDW: 14.5 % (ref 11.5–15.5)
WBC: 12.8 10*3/uL — ABNORMAL HIGH (ref 4.0–10.5)

## 2016-12-12 LAB — AEROBIC/ANAEROBIC CULTURE W GRAM STAIN (SURGICAL/DEEP WOUND)

## 2016-12-12 LAB — BASIC METABOLIC PANEL
Anion gap: 4 — ABNORMAL LOW (ref 5–15)
CHLORIDE: 99 mmol/L — AB (ref 101–111)
CO2: 29 mmol/L (ref 22–32)
CREATININE: 0.62 mg/dL (ref 0.44–1.00)
Calcium: 7.6 mg/dL — ABNORMAL LOW (ref 8.9–10.3)
GFR calc Af Amer: >60 mL/min (ref >60)
GFR calc non Af Amer: >60 mL/min (ref >60)
GLUCOSE: 91 mg/dL (ref 65–99)
Potassium: 4.1 mmol/L (ref 3.5–5.1)
SODIUM: 132 mmol/L — AB (ref 135–145)

## 2016-12-12 LAB — PHOSPHORUS: Phosphorus: 4.6 mg/dL (ref 2.5–4.6)

## 2016-12-12 LAB — MAGNESIUM: MAGNESIUM: 1.3 mg/dL — AB (ref 1.7–2.4)

## 2016-12-12 MED ORDER — AMLODIPINE BESYLATE 10 MG PO TABS
10.0000 mg | ORAL_TABLET | Freq: Every day | ORAL | Status: DC
  Administered 2016-12-12 – 2016-12-15 (×4): 10 mg via ORAL
  Filled 2016-12-12 (×4): qty 1

## 2016-12-12 MED ORDER — IOPAMIDOL (ISOVUE-300) INJECTION 61%
15.0000 mL | Freq: Two times a day (BID) | INTRAVENOUS | Status: DC | PRN
  Administered 2016-12-13: 07:00:00 15 mL via ORAL
  Filled 2016-12-12: qty 30

## 2016-12-12 MED ORDER — POLYETHYLENE GLYCOL 3350 17 G PO PACK
17.0000 g | PACK | Freq: Every day | ORAL | Status: DC
  Administered 2016-12-12 – 2016-12-26 (×10): 17 g via ORAL
  Filled 2016-12-12 (×13): qty 1

## 2016-12-12 MED ORDER — SODIUM CHLORIDE 0.9% FLUSH
10.0000 mL | INTRAVENOUS | Status: DC | PRN
  Administered 2016-12-14 – 2016-12-25 (×2): 10 mL
  Filled 2016-12-12 (×2): qty 40

## 2016-12-12 MED ORDER — MAGNESIUM CHLORIDE 64 MG PO TBEC
2.0000 | DELAYED_RELEASE_TABLET | Freq: Every day | ORAL | Status: AC
  Administered 2016-12-12: 128 mg via ORAL
  Filled 2016-12-12: qty 2

## 2016-12-12 MED ORDER — IOPAMIDOL (ISOVUE-300) INJECTION 61%
INTRAVENOUS | Status: AC
  Filled 2016-12-12: qty 30

## 2016-12-12 MED ORDER — IOPAMIDOL (ISOVUE-300) INJECTION 61%: 15.0000 mL | Freq: Two times a day (BID) | INTRAVENOUS | Status: DC | PRN

## 2016-12-12 NOTE — Progress Notes (Signed)
Nutrition Follow-up  DOCUMENTATION CODES:   Obesity unspecified  INTERVENTION:   Boost Breeze po TID, each supplement provides 250 kcal and 9 grams of protein  NUTRITION DIAGNOSIS:   Inadequate oral intake related to inability to eat as evidenced by NPO status. Resolving  GOAL:   Patient will meet greater than or equal to 90% of their needs  MONITOR:   PO intake, Supplement acceptance, Labs, Weight trends    ASSESSMENT:   71 y.o. female presented to the ED on 12/03/16 with c/o RLQ pain.  Abd CT showed perforated appendicitis with contained perforation/abscess -- s/p IR drain on 12/04/16.  Zosyn was started for broad coverage.  Patient reported rash and itching on 12/05/16 and this was suspected to be related to zosyn.  Zosyn d/ced on 12/06/16 and changed to cipro and flagyl.  On 12/07/16, pt's rash appeared to be getting worse after cipro started.  Abscess culture from 3/6/18now back with enterococcus faecalis.  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   Pt initiated on oral diet and tolerating full liquids well. Pt advanced to soft diet today. No BM since 3/6.  Pt tolerating Boost Breeze. No new weight on this pt since 3/5; will need to obtain updated weight. Pt with possible lung mass. Pt scheduled to have thoracentesis on 3/13 but did not because limited amount of fluid. Per MD note, "next logical step would be a bronchoscopy with airway inspection and endobronchial ultrasound-guided fine-needle aspiration for appropriate mediastinal lymph node staging". Plan to repeat abdominal CT scan tomorrow. Mg low today; monitor and supplement as needed per MD discretion.   Medications reviewed and include: colace, heparin, protonix, miralax, vancomycin, hydromorphone  Labs reviewed: Na 132(L), Cl 99(L), BUN <5(L), Ca 7.6(L), Mg 1.3(L), P 4.6 wnl Wbc- 12.8(H)  Diet Order:  DIET SOFT Room service appropriate? Yes; Fluid consistency: Thin  Skin:  Wound (see comment)  (abdominal drains)  Last BM:  3/6  Height:   Ht Readings from Last 1 Encounters:  12/03/16 '5\' 7"'$  (1.702 m)    Weight:   Wt Readings from Last 1 Encounters:  12/03/16 225 lb (102.1 kg)    Ideal Body Weight:  61.3 kg  BMI:  Body mass index is 35.24 kg/m.  Estimated Nutritional Needs:   Kcal:  1700-2000kcal/day   Protein:  81-104g/day   Fluid:  >1.7L  EDUCATION NEEDS:   No education needs identified at this time  Koleen Distance, RD, LDN Pager #[phone]

## 2016-12-12 NOTE — Progress Notes (Signed)
Patient ID: Alisha Fisher, female   DOB: March 22, 1946, 71 y.o.   MRN: [id number]  [facility] Surgery Progress Note     Subjective: Doing ok this morning. Tolerating full liquids. Reports no increased pain with PO intake. Denies n/v. Abdominal pain ok at rest, worse with movement. Denies any current SOB.  Objective: Vital signs in last 24 hours: Temp:  [97.6 F (36.4 C)-98 F (36.7 C)] 97.6 F (36.4 C) (03/14 0457) Pulse Rate:  [64-77] 72 (03/14 0457) Resp:  [18-20] 18 (03/14 0457) BP: (156-184)/(62-77) 169/75 (03/14 0640) SpO2:  [86 %-100 %] 98 % (03/14 0457) Last BM Date: 12/04/16  Intake/Output from previous day: 03/13 0701 - 03/14 0700 In: 2745 [P.O.:360; I.V.:1930; IV Piggyback:450] Out: 312 [Urine:300; Drains:12] Intake/Output this shift: No intake/output data recorded.  PE: Gen: Alert, NAD, pleasant Card: RRR, no M/G/R heard Pulm: CTAB,no wheezes,effort normal Abd: Soft, mild distension, +BS, no HSM, RLQ drain with minimal serous drainage in bulb, mild suprapubic tenderness Skin: diffuse maculopapular rash on trunk, upper extremities, and face significantly improving, minimal rash now remains on trunk   Lab Results:   Recent Labs  12/11/16 0458 12/12/16 0440  WBC 15.0* 12.8*  HGB 11.5* 11.7*  HCT 34.8* 35.9*  PLT 421* 405*   BMET  Recent Labs  12/10/16 0420 12/11/16 0458 12/12/16 0644  NA 136  --  132*  K 4.2  --  4.1  CL 106  --  99*  CO2 27  --  29  GLUCOSE 95  --  91  BUN 7  --  <5*  CREATININE 0.64  0.62 0.57 0.62  CALCIUM 8.1*  --  7.6*   PT/INR No results for input(s): LABPROT, INR in the last 72 hours. CMP     Component Value Date/Time   NA 132 (L) 12/12/2016 0644   K 4.1 12/12/2016 0644   CL 99 (L) 12/12/2016 0644   CO2 29 12/12/2016 0644   GLUCOSE 91 12/12/2016 0644   BUN <5 (L) 12/12/2016 0644   CREATININE 0.62 12/12/2016 0644   CALCIUM 7.6 (L) 12/12/2016 0644   PROT 6.7 12/03/2016 1613   ALBUMIN 2.8 (L) 12/03/2016  1613   AST 19 12/03/2016 1613   ALT 13 (L) 12/03/2016 1613   ALKPHOS 98 12/03/2016 1613   BILITOT 0.7 12/03/2016 1613   GFRNONAA >60 12/12/2016 0644   GFRAA >60 12/12/2016 0644   Lipase     Component Value Date/Time   LIPASE 15 12/03/2016 1613       Studies/Results: [place] Chest  Result Date: 12/11/2016 CLINICAL DATA:  Patient with history of right lung endobronchial mass, perforated appendicitis with recent abscess drainage, small pleural effusions. Request made for right thoracentesis . EXAM: CHEST ULTRASOUND COMPARISON:  CT chest done 12/08/2016 FINDINGS: Limited ultrasound right posterior chest region reveals a very small loculated pleural effusion. IMPRESSION: Limited ultrasound right posterior chest today reveals a very small loculated pleural effusion. Options of proceeding with diagnostic right thoracentesis versus obtaining follow-up assessment of fluid volume in a few days for thoracentesis were discussed with patient. She wishes to postpone thoracentesis today and re-evaluate fluid volume in a few days. Procedure not performed. Read by: Rowe Robert, PA-C Electronically Signed   By: Jacqulynn Cadet M.D.   On: 12/11/2016 10:36    Anti-infectives: Anti-infectives    Start     Dose/Rate Route Frequency Ordered Stop   12/10/16 1400  vancomycin (VANCOCIN) IVPB 1000 mg/200 mL premix     1,000 mg 100 mL/hr over  120 Minutes Intravenous Every 12 hours 12/10/16 1059     12/08/16 1800  ertapenem (INVANZ) 1 g in sodium chloride 0.9 % 50 mL IVPB     1 g 100 mL/hr over 30 Minutes Intravenous Every 24 hours 12/08/16 1512     12/08/16 1600  vancomycin (VANCOCIN) IVPB 750 mg/150 ml premix  Status:  Discontinued     750 mg 100 mL/hr over 90 Minutes Intravenous Every 12 hours 12/08/16 1513 12/10/16 1059   12/07/16 2300  vancomycin (VANCOCIN) IVPB 750 mg/150 ml premix  Status:  Discontinued     750 mg 150 mL/hr over 60 Minutes Intravenous Every 12 hours 12/07/16 1103 12/08/16 1513    12/07/16 2200  ciprofloxacin (CIPRO) IVPB 400 mg  Status:  Discontinued     400 mg 200 mL/hr over 60 Minutes Intravenous Every 12 hours 12/07/16 1122 12/08/16 1512   12/07/16 1115  vancomycin (VANCOCIN) 2,000 mg in sodium chloride 0.9 % 500 mL IVPB     2,000 mg 250 mL/hr over 120 Minutes Intravenous  Once 12/07/16 1103 12/07/16 1440   12/06/16 2200  ciprofloxacin (CIPRO) IVPB 400 mg  Status:  Discontinued     400 mg 200 mL/hr over 60 Minutes Intravenous Every 12 hours 12/06/16 1409 12/07/16 1039   12/06/16 1400  piperacillin-tazobactam (ZOSYN) IVPB 3.375 g  Status:  Discontinued     3.375 g 12.5 mL/hr over 240 Minutes Intravenous Every 8 hours 12/06/16 0643 12/06/16 0825   12/06/16 1000  ciprofloxacin (CIPRO) IVPB 200 mg  Status:  Discontinued     200 mg 100 mL/hr over 60 Minutes Intravenous Every 12 hours 12/06/16 0825 12/06/16 1409   12/06/16 1000  metroNIDAZOLE (FLAGYL) IVPB 500 mg  Status:  Discontinued     500 mg 100 mL/hr over 60 Minutes Intravenous Every 8 hours 12/06/16 0825 12/08/16 1512   12/04/16 0630  piperacillin-tazobactam (ZOSYN) 3.375 g in dextrose 5 % 50 mL IVPB  Status:  Discontinued     3.375 g 12.5 mL/hr over 240 Minutes Intravenous Every 8 hours 12/04/16 0623 12/06/16 0643   12/03/16 2130  piperacillin-tazobactam (ZOSYN) IVPB 3.375 g  Status:  Discontinued     3.375 g 12.5 mL/hr over 240 Minutes Intravenous Every 8 hours 12/03/16 2125 12/04/16 0623   12/03/16 1915  piperacillin-tazobactam (ZOSYN) IVPB 3.375 g     3.375 g 100 mL/hr over 30 Minutes Intravenous  Once 12/03/16 1907 12/03/16 2005       Assessment/Plan Ruptured appendicitis - no abdominal surgical history - CT scan showed perforated appendicitis with 2.9 x 5.8 x 5 cm RIGHT lower quadrant contained perforation/abscess - drain with 12cc/24hr serous drainage  - s/p IR drain 3/6, culture: enterococcus faecalis   Rash - improving  Bilateral pleural effusions - thoracentesis not performed 3/13 due  to limited amount of fluid COPD - on O2 at home. on Dulera 2 puffs twice a day. Patient will need repeat pulmonary function testing as an outpatient as well as screening for alpha-1 antitrypsin deficiency RIGHT lower lobe mass/ Right-sided endobronchial obstruction - per CCM, If pleural fluid is unrevealing or cannot be obtained the next logical step would be a bronchoscopy with airway inspection and endobronchial ultrasound-guided fine-needle aspiration for appropriate mediastinal lymph node staging Recent pneumonia - earlier this year HTN - metoprolol Former smoker - quit earlier this year  ID - zosyn 3/5>>3/8, cipro 3/8>>3/10, flagyl 3/8>>3/10, vanco 3/9>>, invanz 3/10>> FEN - IVF, soft diet VTE - SCDs, heparin  Plan - Advance to  soft diet and add miralax to bowel regimen. Replace magnesium. Continue drain and IV antibiotics. Plan for repeat abdominal CT scan tomorrow AM.  Pulmonary issues per CCM.    LOS: 9 days    Jerrye Beavers , [facility] Surgery 12/12/2016, 7:56 AM Pager: [phone] Consults: [phone] Mon-Fri 7:00 am-4:30 pm Sat-Sun 7:00 am-11:30 am

## 2016-12-12 NOTE — Progress Notes (Signed)
Peripherally Inserted Central Catheter/Midline Placement  The IV Nurse has discussed with the patient and/or persons authorized to consent for the patient, the purpose of this procedure and the potential benefits and risks involved with this procedure.  The benefits include less needle sticks, lab draws from the catheter, and the patient may be discharged home with the catheter. Risks include, but not limited to, infection, bleeding, blood clot (thrombus formation), and puncture of an artery; nerve damage and irregular heartbeat and possibility to perform a PICC exchange if needed/ordered by physician.  Alternatives to this procedure were also discussed.  Bard Power PICC patient education guide, fact sheet on infection prevention and patient information card has been provided to patient /or left at bedside.    PICC/Midline Placement Documentation        Alisha Fisher 12/12/2016, 5:19 PM

## 2016-12-12 NOTE — Progress Notes (Signed)
Physical Therapy Treatment Patient Details Name: Alisha Fisher MRN: [id number] DOB: 11-23-1945 Today's Date: 12/12/2016    History of Present Illness Alisha Fisher is a 71 y.o. female ,former smoker, with history of hypertension, COPD, who presented to ED 12/03/16 with 3 day history of right lower quadrant abdominal pain. Subsequent imaging revealed. Perforated appendicitis     PT Comments    Pt OOB in recliner applying make up.  On 2 lts at rest 96%.  Rollator walker delivered and in room.  Assisted with transfer and gait training with her new AD.  Tolerated an increased distance however did require one sitting rest break and purse lip breathing instructions.  Instructed on proper rollator use, breaks and safety.  Returned to room, assisted to bathroom then back to recliner to finish make up.     Follow Up Recommendations  Home health PT;Supervision/Assistance - 24 hour     Equipment Recommendations   (rollator dekivered and in room)    Recommendations for Other Services       Precautions / Restrictions Precautions Precautions: Fall Precaution Comments: right drain, on home oxygen Restrictions Weight Bearing Restrictions: No    Mobility  Bed Mobility               General bed mobility comments: OOB in recliner  Transfers Overall transfer level: Needs assistance Equipment used: None Transfers: Sit to/from Stand Sit to Stand: Supervision         General transfer comment: increased time due to ABD discomfort plus 25% VC's to avoid pulling self up using rollator  Ambulation/Gait Ambulation/Gait assistance: Supervision Ambulation Distance (Feet): 55 Feet (25 feet with one sitting rest break then another 30 feet) Assistive device: 4-wheeled walker Gait Pattern/deviations: Step-to pattern;Step-through pattern Gait velocity: decreased   General Gait Details: assisted with amb using Rollator that was deleivered for home use.  Instructed on safety with turns, use of  breaks and safety esp to back rollator up against wall prior to sitting on seat.  Noted 2/4 DOE and sats ranged from 86 to 91% on 2 lts O2.  Limited amb distance and required one sitting rest break.  Also, instructed on purse lip breathing.     Stairs            Wheelchair Mobility    Modified Rankin (Stroke Patients Only)       Balance                                    Cognition Arousal/Alertness: Awake/alert Behavior During Therapy: WFL for tasks assessed/performed Overall Cognitive Status: Within Functional Limits for tasks assessed                      Exercises      General Comments        Pertinent Vitals/Pain Pain Assessment: Faces Faces Pain Scale: Hurts little more Pain Location: R side near drain with activity sit to stand and stand to sit Pain Descriptors / Indicators: Discomfort;Grimacing Pain Intervention(s): Monitored during session    Home Living                      Prior Function            PT Goals (current goals can now be found in the care plan section) Progress towards PT goals: Progressing toward goals    Frequency    Min  3X/week      PT Plan Current plan remains appropriate    Co-evaluation             End of Session Equipment Utilized During Treatment: Oxygen Activity Tolerance: Patient limited by fatigue Patient left: in chair;with call bell/phone within reach Nurse Communication: Mobility status PT Visit Diagnosis: Pain;Unsteadiness on feet (R26.81)     Time: 7342-8768 PT Time Calculation (min) (ACUTE ONLY): 25 min  Charges:  $Gait Training: 8-22 mins $Therapeutic Activity: 8-22 mins                    G Codes:       Rica Koyanagi  PTA [facility]  Acute  Rehab Pager      [phone]

## 2016-12-12 NOTE — Progress Notes (Signed)
Patient ID: Alisha Fisher, female   DOB: 01-17-1946, 71 y.o.   MRN: [id number]    Referring Physician(s): Dr. Autumn Messing  Supervising Physician: Aletta Edouard  Patient Status: [facility] - In-pt  Chief Complaint: RLQ abscess  Subjective: Patient still with some RLQ pain.  Allergies: Ciprofloxacin; Flagyl [metronidazole]; and Zosyn [piperacillin sod-tazobactam so]  Medications: Prior to Admission medications   Medication Sig Start Date End Date Taking? Authorizing Provider  ADVAIR DISKUS 250-50 MCG/DOSE AEPB Inhale 1 puff into the lungs Twice daily. 03/10/12  Yes Historical Provider, MD  albuterol (PROVENTIL HFA;VENTOLIN HFA) 108 (90 Base) MCG/ACT inhaler Inhale 2 puffs into the lungs every 6 (six) hours as needed for wheezing or shortness of breath.   Yes Historical Provider, MD  ALPRAZolam Duanne Moron) 0.5 MG tablet Take 0.5 mg by mouth 3 (three) times daily as needed for anxiety.  11/21/16  Yes Historical Provider, MD  amLODipine (NORVASC) 10 MG tablet Take 10 mg by mouth daily. 09/11/16  Yes Historical Provider, MD  Cholecalciferol (VITAMIN D PO) Take 4,000-5,000 Units by mouth daily.   Yes Historical Provider, MD  citalopram (CELEXA) 10 MG tablet Take 10 mg by mouth daily. 11/21/16  Yes Historical Provider, MD  losartan-hydrochlorothiazide (HYZAAR) 100-25 MG per tablet Take 1 tablet by mouth daily. 05/04/14  Yes Belva Crome, MD  metoprolol (LOPRESSOR) 100 MG tablet Take 1.5 tablets (150 mg total) by mouth daily. 07/14/13  Yes Belva Crome, MD  amLODipine (NORVASC) 5 MG tablet Take 1 tablet (5 mg total) by mouth daily. Patient not taking: Reported on 12/03/2016 07/14/13   Belva Crome, MD    Vital Signs: BP (!) 162/72 (BP Location: Right Arm)   Pulse 66   Temp 98.4 F (36.9 C) (Oral)   Resp 16   Ht '5\' 7"'$  (1.702 m)   Wt 225 lb (102.1 kg)   SpO2 98%   BMI 35.24 kg/m   Physical Exam: Abd: drain in place with minimal serous output.  Drain site is c/d/i.  12cc out  yesterday.  Imaging: [place] Chest  Result Date: 12/11/2016 CLINICAL DATA:  Patient with history of right lung endobronchial mass, perforated appendicitis with recent abscess drainage, small pleural effusions. Request made for right thoracentesis . EXAM: CHEST ULTRASOUND COMPARISON:  CT chest done 12/08/2016 FINDINGS: Limited ultrasound right posterior chest region reveals a very small loculated pleural effusion. IMPRESSION: Limited ultrasound right posterior chest today reveals a very small loculated pleural effusion. Options of proceeding with diagnostic right thoracentesis versus obtaining follow-up assessment of fluid volume in a few days for thoracentesis were discussed with patient. She wishes to postpone thoracentesis today and re-evaluate fluid volume in a few days. Procedure not performed. Read by: Rowe Robert, PA-C Electronically Signed   By: Jacqulynn Cadet M.D.   On: 12/11/2016 10:36    Labs:  CBC:  Recent Labs  12/08/16 0446 12/10/16 0420 12/11/16 0458 12/12/16 0440  WBC 15.8* 15.7* 15.0* 12.8*  HGB 11.2* 11.5* 11.5* 11.7*  HCT 34.0* 33.7* 34.8* 35.9*  PLT 343 400 421* 405*    COAGS:  Recent Labs  12/04/16 1005  INR 1.15    BMP:  Recent Labs  12/07/16 0457 12/08/16 0446 12/09/16 0420 12/10/16 0420 12/11/16 0458 12/12/16 0644  NA 133* 133*  --  136  --  132*  K 4.1 3.9  --  4.2  --  4.1  CL 101 103  --  106  --  99*  CO2 24 26  --  27  --  29  GLUCOSE 91 93  --  95  --  91  BUN 16 10  --  7  --  <5*  CALCIUM 8.5* 8.2*  --  8.1*  --  7.6*  CREATININE 0.64 0.55 0.57 0.64  0.62 0.57 0.62  GFRNONAA >60 >60 >60 >60  >60 >60 >60  GFRAA >60 >60 >60 >60  >60 >60 >60    LIVER FUNCTION TESTS:  Recent Labs  12/03/16 1613  BILITOT 0.7  AST 19  ALT 13*  ALKPHOS 98  PROT 6.7  ALBUMIN 2.8*    Assessment and Plan: 1. Perforated appendicitis with abscess, s/p perc drain on 3/6 -drainage output is low and output is mostly serous. -agree with repeat CT  scan tomorrow -cont drain flushes -will follow  Electronically Signed: Mirza Kidney E 12/12/2016, 2:01 PM   I spent a total of 15 Minutes at the the patient's bedside AND on the patient's hospital floor or unit, greater than 50% of which was counseling/coordinating care for RLQ abscess

## 2016-12-13 ENCOUNTER — Encounter (HOSPITAL_COMMUNITY): Payer: Self-pay | Admitting: Radiology

## 2016-12-13 ENCOUNTER — Inpatient Hospital Stay (HOSPITAL_COMMUNITY): Payer: Medicare Other

## 2016-12-13 DIAGNOSIS — R222 Localized swelling, mass and lump, trunk: Secondary | ICD-10-CM

## 2016-12-13 LAB — BASIC METABOLIC PANEL
Anion gap: 4 — ABNORMAL LOW (ref 5–15)
BUN: 6 mg/dL (ref 6–20)
CO2: 32 mmol/L (ref 22–32)
Calcium: 8.5 mg/dL — ABNORMAL LOW (ref 8.9–10.3)
Chloride: 105 mmol/L (ref 101–111)
Creatinine, Ser: 0.58 mg/dL (ref 0.44–1.00)
GFR calc Af Amer: >60 mL/min (ref >60)
GLUCOSE: 105 mg/dL — AB (ref 65–99)
POTASSIUM: 3.6 mmol/L (ref 3.5–5.1)
Sodium: 141 mmol/L (ref 135–145)

## 2016-12-13 LAB — VANCOMYCIN, TROUGH: VANCOMYCIN TR: 14 ug/mL — AB (ref 15–20)

## 2016-12-13 LAB — CBC
HCT: 34.1 % — ABNORMAL LOW (ref 36.0–46.0)
Hemoglobin: 11.1 g/dL — ABNORMAL LOW (ref 12.0–15.0)
MCH: 30.8 pg (ref 26.0–34.0)
MCHC: 32.6 g/dL (ref 30.0–36.0)
MCV: 94.7 fL (ref 78.0–100.0)
PLATELETS: 421 10*3/uL — AB (ref 150–400)
RBC: 3.6 MIL/uL — AB (ref 3.87–5.11)
RDW: 14.6 % (ref 11.5–15.5)
WBC: 12.1 10*3/uL — ABNORMAL HIGH (ref 4.0–10.5)

## 2016-12-13 LAB — MAGNESIUM: Magnesium: 1.3 mg/dL — ABNORMAL LOW (ref 1.7–2.4)

## 2016-12-13 MED ORDER — MAGNESIUM CHLORIDE 64 MG PO TBEC
2.0000 | DELAYED_RELEASE_TABLET | Freq: Two times a day (BID) | ORAL | Status: AC
  Administered 2016-12-13 (×2): 128 mg via ORAL
  Filled 2016-12-13 (×2): qty 2

## 2016-12-13 MED ORDER — SODIUM CHLORIDE 0.9% FLUSH: 10.0000 mL | INTRAVENOUS | Status: DC | PRN

## 2016-12-13 MED ORDER — IOPAMIDOL (ISOVUE-300) INJECTION 61%: 15.0000 mL | Freq: Once | INTRAVENOUS | Status: DC | PRN

## 2016-12-13 MED ORDER — HYDRALAZINE HCL 20 MG/ML IJ SOLN: 10.0000 mg | Freq: Four times a day (QID) | INTRAMUSCULAR | Status: DC | PRN

## 2016-12-13 MED ORDER — IOPAMIDOL (ISOVUE-300) INJECTION 61%
INTRAVENOUS | Status: AC
  Administered 2016-12-13: 100 mL
  Filled 2016-12-13: qty 100

## 2016-12-13 MED ORDER — VANCOMYCIN HCL 10 G IV SOLR
1250.0000 mg | Freq: Two times a day (BID) | INTRAVENOUS | Status: DC
  Administered 2016-12-14 – 2016-12-16 (×5): 1250 mg via INTRAVENOUS
  Filled 2016-12-13 (×7): qty 1250

## 2016-12-13 NOTE — Progress Notes (Signed)
Brief Note - Vancomycin  Labs: vancomycin trough 14  A/P: Vanc trough just below goal of 15-20 mcg/mL on dose of 1g q12. Will change dose to '1250mg'$  IV q12 and recheck trough if necessary  Adrian Saran, PharmD, BCPS Pager [phone] 12/13/2016 7:15 PM

## 2016-12-13 NOTE — Progress Notes (Signed)
Patient ID: Alisha Fisher, female   DOB: 1946-08-07, 71 y.o.   MRN: [id number]  [facility] Surgery Progress Note     Subjective: No change. Patient states that she continues to have some lower abdominal pain. She did have a small BM yesterday. Tolerating small amount of diet but does not have much of an appetite.  CT scan planned for today.  Objective: Vital signs in last 24 hours: Temp:  [98.4 F (36.9 C)-98.7 F (37.1 C)] 98.7 F (37.1 C) (03/15 0558) Pulse Rate:  [66-95] 94 (03/15 0646) Resp:  [16-20] 20 (03/15 0558) BP: (154-186)/(60-95) 175/80 (03/15 0646) SpO2:  [91 %-98 %] 96 % (03/15 0633) Last BM Date: 12/04/16  Intake/Output from previous day: 03/14 0701 - 03/15 0700 In: 3962 [P.O.:1502; I.V.:2400; IV Piggyback:50] Out: 1070.5 [Urine:1050; Drains:20.5] Intake/Output this shift: Total I/O In: -  Out: 300 [Urine:300]  PE: Gen: Alert, NAD, pleasant Card: RRR, no M/G/R heard Pulm: CTAB,no wheezes,effort normal, on [place] O2 Abd: Soft, minimal distension, +BS, no HSM, RLQ drain with minimal purulentdrainage in bulb, mild suprapubic and LLQ tenderness Skin: diffuse rash nearly resolved  Lab Results:   Recent Labs  12/12/16 0440 12/13/16 0314  WBC 12.8* 12.1*  HGB 11.7* 11.1*  HCT 35.9* 34.1*  PLT 405* 421*   BMET  Recent Labs  12/12/16 0644 12/13/16 0314  NA 132* 141  K 4.1 3.6  CL 99* 105  CO2 29 32  GLUCOSE 91 105*  BUN <5* 6  CREATININE 0.62 0.58  CALCIUM 7.6* 8.5*   PT/INR No results for input(s): LABPROT, INR in the last 72 hours. CMP     Component Value Date/Time   NA 141 12/13/2016 0314   K 3.6 12/13/2016 0314   CL 105 12/13/2016 0314   CO2 32 12/13/2016 0314   GLUCOSE 105 (H) 12/13/2016 0314   BUN 6 12/13/2016 0314   CREATININE 0.58 12/13/2016 0314   CALCIUM 8.5 (L) 12/13/2016 0314   PROT 6.7 12/03/2016 1613   ALBUMIN 2.8 (L) 12/03/2016 1613   AST 19 12/03/2016 1613   ALT 13 (L) 12/03/2016 1613   ALKPHOS 98 12/03/2016  1613   BILITOT 0.7 12/03/2016 1613   GFRNONAA >60 12/13/2016 0314   GFRAA >60 12/13/2016 0314   Lipase     Component Value Date/Time   LIPASE 15 12/03/2016 1613       Studies/Results: [place] Chest  Result Date: 12/11/2016 CLINICAL DATA:  Patient with history of right lung endobronchial mass, perforated appendicitis with recent abscess drainage, small pleural effusions. Request made for right thoracentesis . EXAM: CHEST ULTRASOUND COMPARISON:  CT chest done 12/08/2016 FINDINGS: Limited ultrasound right posterior chest region reveals a very small loculated pleural effusion. IMPRESSION: Limited ultrasound right posterior chest today reveals a very small loculated pleural effusion. Options of proceeding with diagnostic right thoracentesis versus obtaining follow-up assessment of fluid volume in a few days for thoracentesis were discussed with patient. She wishes to postpone thoracentesis today and re-evaluate fluid volume in a few days. Procedure not performed. Read by: Rowe Robert, PA-C Electronically Signed   By: Jacqulynn Cadet M.D.   On: 12/11/2016 10:36    Anti-infectives: Anti-infectives    Start     Dose/Rate Route Frequency Ordered Stop   12/10/16 1400  vancomycin (VANCOCIN) IVPB 1000 mg/200 mL premix     1,000 mg 100 mL/hr over 120 Minutes Intravenous Every 12 hours 12/10/16 1059     12/08/16 1800  ertapenem (INVANZ) 1 g in sodium chloride 0.9 %  50 mL IVPB     1 g 100 mL/hr over 30 Minutes Intravenous Every 24 hours 12/08/16 1512     12/08/16 1600  vancomycin (VANCOCIN) IVPB 750 mg/150 ml premix  Status:  Discontinued     750 mg 100 mL/hr over 90 Minutes Intravenous Every 12 hours 12/08/16 1513 12/10/16 1059   12/07/16 2300  vancomycin (VANCOCIN) IVPB 750 mg/150 ml premix  Status:  Discontinued     750 mg 150 mL/hr over 60 Minutes Intravenous Every 12 hours 12/07/16 1103 12/08/16 1513   12/07/16 2200  ciprofloxacin (CIPRO) IVPB 400 mg  Status:  Discontinued     400 mg 200  mL/hr over 60 Minutes Intravenous Every 12 hours 12/07/16 1122 12/08/16 1512   12/07/16 1115  vancomycin (VANCOCIN) 2,000 mg in sodium chloride 0.9 % 500 mL IVPB     2,000 mg 250 mL/hr over 120 Minutes Intravenous  Once 12/07/16 1103 12/07/16 1440   12/06/16 2200  ciprofloxacin (CIPRO) IVPB 400 mg  Status:  Discontinued     400 mg 200 mL/hr over 60 Minutes Intravenous Every 12 hours 12/06/16 1409 12/07/16 1039   12/06/16 1400  piperacillin-tazobactam (ZOSYN) IVPB 3.375 g  Status:  Discontinued     3.375 g 12.5 mL/hr over 240 Minutes Intravenous Every 8 hours 12/06/16 0643 12/06/16 0825   12/06/16 1000  ciprofloxacin (CIPRO) IVPB 200 mg  Status:  Discontinued     200 mg 100 mL/hr over 60 Minutes Intravenous Every 12 hours 12/06/16 0825 12/06/16 1409   12/06/16 1000  metroNIDAZOLE (FLAGYL) IVPB 500 mg  Status:  Discontinued     500 mg 100 mL/hr over 60 Minutes Intravenous Every 8 hours 12/06/16 0825 12/08/16 1512   12/04/16 0630  piperacillin-tazobactam (ZOSYN) 3.375 g in dextrose 5 % 50 mL IVPB  Status:  Discontinued     3.375 g 12.5 mL/hr over 240 Minutes Intravenous Every 8 hours 12/04/16 0623 12/06/16 0643   12/03/16 2130  piperacillin-tazobactam (ZOSYN) IVPB 3.375 g  Status:  Discontinued     3.375 g 12.5 mL/hr over 240 Minutes Intravenous Every 8 hours 12/03/16 2125 12/04/16 0623   12/03/16 1915  piperacillin-tazobactam (ZOSYN) IVPB 3.375 g     3.375 g 100 mL/hr over 30 Minutes Intravenous  Once 12/03/16 1907 12/03/16 2005       Assessment/Plan Ruptured appendicitis - no abdominal surgical history - CT scan showed perforated appendicitis with 2.9 x 5.8 x 5 cm RIGHT lower quadrant contained perforation/abscess - drain with 20.5cc/24hr purulent drainage  - s/p IR drain 3/6, culture: enterococcus faecalis  - WBC continues to slowly improve, 12.1 today  Rash - improving  Bilateral pleural effusions- thoracentesis not performed 3/13 due to limited amount of fluid COPD - on  O2 at home. on Dulera 2 puffs twice a day. Patient will need repeat pulmonary function testing as an outpatient as well as screening for alpha-1 antitrypsin deficiency RIGHT lower lobe mass/ Right-sided endobronchial obstruction- per CCM, If pleural fluid is unrevealing or cannot be obtained the next logical step would be a bronchoscopy with airway inspection and endobronchial ultrasound-guided fine-needle aspiration for appropriate mediastinal lymph node staging Recent pneumonia- earlier this year HTN - metoprolol, norvasc, hydralazine PRN Former smoker- quit earlier this year  ID - zosyn 3/5>>3/8, cipro 3/8>>3/10, flagyl 3/8>>3/10, vanco 3/9>>, invanz 3/10>> FEN - IVF, soft diet VTE - SCDs, heparin  Plan - CT scan planned for today. Will make further recommendations after scan is complete. For now continue soft diet, bowel  regimen, drain, and IV antibiotics. Replace magnesium.  Pulmonary issues per CCM.   LOS: 10 days    Jerrye Beavers , [facility] 12/13/2016, 9:31 AM Pager: [phone] Consults: [phone] Mon-Fri 7:00 am-4:30 pm Sat-Sun 7:00 am-11:30 am

## 2016-12-13 NOTE — Progress Notes (Signed)
[place] Attending Note  Presenting HPI:  71 y.o. female with a history of emphysema and essential hypertension admitted in February 2010 at outside hospital. She was found to have what appeared to be an endobronchial mass causing subsequent collapse with in her right lower lobe. She was evaluated by pulmonary at outside hospital and the plan was for the patient to undergo bronchoscopy on 3/7. Patient was admitted on 3/5 to our facility with a ruptured appendix and associated peritonitis/abscess. Patient reports that her abdominal pain seems to be well-controlled but she does continue to have intermittent dyspnea. She also endorses intermittent nausea. She denies any chronic chest discomfort but did experience a "heaviness" in her chest yesterday that spontaneously resolved. She denies any dysphagia or odynophagia. She denies any headache or focal vision changes. She does endorse some intermittent left thigh numbness but no other focal weakness or tingling. She denies any subjective chills or sweats. She denies any weight loss. Of note on the patient was on Zosyn she developed a rash over her whole body as well as some sloughing of her oral mucous membranes. She denies any abnormal bruising. She does report that she has had some dyspnea since 2013 approximately.  Subjective:  Patient reports continued dyspnea with anxiety. Minimal cough. Cough remains largely nonproductive. No chest pain, pressure, or tightness. Previous evaluation by interventional radiology revealed a loculated effusion and patient declined diagnostic thoracentesis at that time.  Review of Systems:  Denies any subjective fever, chills, or sweats. Denies any abdominal pain or nausea at this moment. No headache or vision changes.  Temp:  [98.4 F (36.9 C)-98.7 F (37.1 C)] 98.7 F (37.1 C) (03/15 0558) Pulse Rate:  [66-95] 94 (03/15 0646) Resp:  [16-20] 20 (03/15 0558) BP: (154-186)/(60-95) 175/80 (03/15  0646) SpO2:  [91 %-98 %] 96 % (03/15 6063)  General:  Awake. No acute distress. Alert. Obese. Integument:  Warm & dry. No rash or bruising on exposed skin. HEENT:  No scleral icterus or injection. Pupils symmetric.  Pulmonary:  Clear bilaterally to auscultation but with slightly decreased breath sounds bilateral lung bases. No accessory muscle use on nasal cannula oxygen.  Cardiovascular:  Regular rate. No JVD apprecaited. Normal S1 & S2. Abdomen:  Soft. Protuberant. Percutaneous drain in place. Neurological:  Cranial nerves grossly in tact. No meningismus. Moving all 4 extremities equally. Oriented x4.   CBC Latest Ref Rng & Units 12/13/2016 12/12/2016 12/11/2016  WBC 4.0 - 10.5 K/uL 12.1(H) 12.8(H) 15.0(H)  Hemoglobin 12.0 - 15.0 g/dL 11.1(L) 11.7(L) 11.5(L)  Hematocrit 36.0 - 46.0 % 34.1(L) 35.9(L) 34.8(L)  Platelets 150 - 400 K/uL 421(H) 405(H) 421(H)   BMP Latest Ref Rng & Units 12/13/2016 12/12/2016 12/11/2016  Glucose 65 - 99 mg/dL 105(H) 91 -  BUN 6 - 20 mg/dL 6 <5(L) -  Creatinine 0.44 - 1.00 mg/dL 0.58 0.62 0.57  Sodium 135 - 145 mmol/L 141 132(L) -  Potassium 3.5 - 5.1 mmol/L 3.6 4.1 -  Chloride 101 - 111 mmol/L 105 99(L) -  CO2 22 - 32 mmol/L 32 29 -  Calcium 8.9 - 10.3 mg/dL 8.5(L) 7.6(L) -    IMAGING/STUDIES: PFT 04/22/12: FVC 1.90 L (67%) FEV1 1.14 L (47%) FEV1/FVC 0.60 FEF 25-75 0.44 L (70%) negative bronchodilator response TLC 4.09 L (75%) RV 100% ERV 30% DLCO uncorrected 63% CT CHEST W/O 12/08/16:  Previously reviewed by me.Endobronchial obstruction with cut off in the right lower lobe and some evidence of obstruction of the right middle  lobe bronchus. Subsequent collapse versus consolidation of right lower lobe and associated pleural effusion. Small left pleural effusion as well. Left-sided nodule which is spiculated. Pathologically enlarged subcarinal as well as one precarinal lymph node. Apical predominant emphysematous changes noted.  MICROBIOLOGY: Abscess Culture  3/6:  Enterococcus faecalis  ANTIBIOTICS: Zosyn 3/5 - 3/8 Cipro 3/8 (x1 dose) Flagyl 3/8 - 3/10 Vancomycin 3/9 >>> Invanz 3/10 >>>  ASSESSMENT/PLAN:  71 y.o. female with history of severe COPD and emphysema based on pulmonary function testing from 2013. Has a long-standing history of tobacco use. CT findings consistent with endobronchial lesion within right lower lobe. Patient does have a reportedly loculated right pleural effusion. The risks with bronchoscopy are more significant compared with the risks of diagnostic thoracentesis. Therefore, I feel a diagnostic thoracentesis is the most reasonable approach at this time.  1. Right pleural effusion/bilateral pleural effusions: We will reassess the right pleural effusion for possible diagnostic thoracentesis this afternoon. Pleural fluid will be sent for culture and cytology as well as routine pleural fluid analysis if possible. 2. Right-sided endobronchial obstruction: If right pleural fluid cannot be obtained as afternoon or is unrevealing I would then consider bronchoscopy with airway inspection and endobronchial ultrasound-guided fine-needle aspiration for appropriate mediastinal lymph node staging of probable underlying lung cancer. 3. Severe COPD with emphysema: No signs of acute exacerbation. Continuing Dulera twice daily. Plan for outpatient pulmonary function testing and alpha-1 antitrypsin deficiency screening.  Remainder of care as per primary service and other consultants.   I have spent a total of 36 minutes of time today caring for the patient, reviewing the patient's electronic medical record, and with more than 50% of that time spent coordinating care with the patient as well as reviewing the continuing plan of care with the patient at bedside.  Sonia Baller Ashok Cordia, M.D. [facility] Pager:  [phone] After 3pm or if no response, call [phone] 12:26 PM 12/13/16

## 2016-12-13 NOTE — Progress Notes (Signed)
Pharmacy Antibiotic Note  Alisha Fisher is a 71 y.o. female presented to the ED on 12/03/16 with c/o RLQ pain.  Abd CT showed perforated appendicitis with contained perforation/abscess -- s/p IR drain on 12/04/16.  Zosyn was started for broad coverage.  Patient reported rash and itching on 12/05/16 and this was suspected to be related to zosyn.  Zosyn d/ced on 12/06/16 and changed to cipro and flagyl.  On 12/07/16, pt's rash appeared to be getting worse after cipro started.  Abscess culture from 12/04/16 grew Enterococcus faecalis.  Vancomycin was added to cipro and flagyl.   Significant events: 3/10: By report, the rash seems to have worsened since starting Cipro and Flagyl. Discussed with Dr. Marlou Starks who agreed to try switching them to [facility]. 3/12: patient reported to surgery that rash is improving  Day #11 total antibiotics - Day #7 Vancomycin - Day #6 Ertapenem  Today, 12/13/2016: - WBC remaining slightly elevated - Afebrile - Drain with 20.5 mL output yesterday - Surgery to make further plans after CT scan today  Plan: - Vancomycin to 1g q12h.  Infusing over a longer period just in case rash is associated with Red Man's Syndrome. Check vancomycin trough tonight.  VT goal 15-20 mcg/ml. - Continue Invanz 1g q24h - F/u plan for duration of antibiotics. _______________________________  Height: '5\' 7"'$  (170.2 cm) Weight: 225 lb (102.1 kg) IBW/kg (Calculated) : 61.6  Temp (24hrs), Avg:98.6 F (37 C), Min:98.4 F (36.9 C), Max:98.7 F (37.1 C)   Recent Labs Lab 12/08/16 0446 12/09/16 0420 12/10/16 0420 12/11/16 0458 12/12/16 0440 12/12/16 0644 12/13/16 0314  WBC 15.8*  --  15.7* 15.0* 12.8*  --  12.1*  CREATININE 0.55 0.57 0.64  0.62 0.57  --  0.62 0.58  VANCOTROUGH  --   --  11*  --   --   --   --     Estimated Creatinine Clearance: 82.7 mL/min (by C-G formula based on SCr of 0.58 mg/dL).    Allergies  Allergen Reactions  . Ciprofloxacin Rash    Rash possibly caused by Cipro?  .  Flagyl [Metronidazole] Rash    Rash possibly caused by Flagyl?  Marland Kitchen Zosyn [Piperacillin Sod-Tazobactam So] Itching and Rash    Has patient had a PCN reaction causing immediate rash, facial/tongue/throat swelling, SOB or lightheadedness with hypotension: No Has patient had a PCN reaction causing severe rash involving mucus membranes or skin necrosis: No Has patient had a PCN reaction that required hospitalization: No Has patient had a PCN reaction occurring within the last 10 years: Yes If all of the above answers are "NO", then may proceed with Cephalosporin use.    Antimicrobials this admission:  3/6 Zosyn >> 3/8 3/8 Cipro >> 3/10 3/8 Flagyl >> 3/10 3/9 Vanc >> 3/10 Invanz >>  Dose adjustments this admission:  3/12 VT = 11 mcg/ml on 750 mg q12h  Microbiology results:  3/6 abscess cx: Enterococcus faecalis (sensitive to ampi, vanc), rare yeast  Thank you for allowing pharmacy to be a part of this patient's care.  Hershal Coria, PharmD, BCPS Pager: [phone] 12/13/2016 9:41 AM

## 2016-12-13 NOTE — Progress Notes (Signed)
CM has touched base with [facility] who will continue to follow for HHPT/RN.  Orders and face to face have been requested of MD.  CM is following.

## 2016-12-13 NOTE — Progress Notes (Signed)
Referring Physician(s): Toth,P  Supervising Physician: Sandi Mariscal  Patient Status:  [name] - In-pt  Chief Complaint: Periappendiceal abscess   Subjective: Patient doing fair; continues to have right lower quadrant discomfort, occasional nausea, mild dyspnea with exertion. Has been out of bed to chair and in hallway occasionally. Has not eaten this morning.   Allergies: Ciprofloxacin; Flagyl [metronidazole]; and Zosyn [piperacillin sod-tazobactam so]  Medications: Prior to Admission medications   Medication Sig Start Date End Date Taking? Authorizing Provider  ADVAIR DISKUS 250-50 MCG/DOSE AEPB Inhale 1 puff into the lungs Twice daily. 03/10/12  Yes Historical Provider, MD  albuterol (PROVENTIL HFA;VENTOLIN HFA) 108 (90 Base) MCG/ACT inhaler Inhale 2 puffs into the lungs every 6 (six) hours as needed for wheezing or shortness of breath.   Yes Historical Provider, MD  ALPRAZolam Duanne Moron) 0.5 MG tablet Take 0.5 mg by mouth 3 (three) times daily as needed for anxiety.  11/21/16  Yes Historical Provider, MD  amLODipine (NORVASC) 10 MG tablet Take 10 mg by mouth daily. 09/11/16  Yes Historical Provider, MD  Cholecalciferol (VITAMIN D PO) Take 4,000-5,000 Units by mouth daily.   Yes Historical Provider, MD  citalopram (CELEXA) 10 MG tablet Take 10 mg by mouth daily. 11/21/16  Yes Historical Provider, MD  losartan-hydrochlorothiazide (HYZAAR) 100-25 MG per tablet Take 1 tablet by mouth daily. 05/04/14  Yes Belva Crome, MD  metoprolol (LOPRESSOR) 100 MG tablet Take 1.5 tablets (150 mg total) by mouth daily. 07/14/13  Yes Belva Crome, MD  amLODipine (NORVASC) 5 MG tablet Take 1 tablet (5 mg total) by mouth daily. Patient not taking: Reported on 12/03/2016 07/14/13   Belva Crome, MD     Vital Signs: BP (!) 175/80   Pulse 94   Temp 98.7 F (37.1 C) (Oral)   Resp 20   Ht '5\' 7"'$  (1.702 m)   Wt 225 lb (102.1 kg)   SpO2 96%   BMI 35.24 kg/m   Physical Exam awake, alert. Right lower  quadrant drain intact, insertion site mild- moderately tender, output 20 mL turbid, beige fluid  Imaging: [place] Chest  Result Date: 12/11/2016 CLINICAL DATA:  Patient with history of right lung endobronchial mass, perforated appendicitis with recent abscess drainage, small pleural effusions. Request made for right thoracentesis . EXAM: CHEST ULTRASOUND COMPARISON:  CT chest done 12/08/2016 FINDINGS: Limited ultrasound right posterior chest region reveals a very small loculated pleural effusion. IMPRESSION: Limited ultrasound right posterior chest today reveals a very small loculated pleural effusion. Options of proceeding with diagnostic right thoracentesis versus obtaining follow-up assessment of fluid volume in a few days for thoracentesis were discussed with patient. She wishes to postpone thoracentesis today and re-evaluate fluid volume in a few days. Procedure not performed. Read by: Rowe Robert, PA-C Electronically Signed   By: Jacqulynn Cadet M.D.   On: 12/11/2016 10:36    Labs:  CBC:  Recent Labs  12/10/16 0420 12/11/16 0458 12/12/16 0440 12/13/16 0314  WBC 15.7* 15.0* 12.8* 12.1*  HGB 11.5* 11.5* 11.7* 11.1*  HCT 33.7* 34.8* 35.9* 34.1*  PLT 400 421* 405* 421*    COAGS:  Recent Labs  12/04/16 1005  INR 1.15    BMP:  Recent Labs  12/08/16 0446  12/10/16 0420 12/11/16 0458 12/12/16 0644 12/13/16 0314  NA 133*  --  136  --  132* 141  K 3.9  --  4.2  --  4.1 3.6  CL 103  --  106  --  99* 105  CO2 26  --  27  --  29 32  GLUCOSE 93  --  95  --  91 105*  BUN 10  --  7  --  <5* 6  CALCIUM 8.2*  --  8.1*  --  7.6* 8.5*  CREATININE 0.55  < > 0.64  0.62 0.57 0.62 0.58  GFRNONAA >60  < > >60  >60 >60 >60 >60  GFRAA >60  < > >60  >60 >60 >60 >60  < > = values in this interval not displayed.  LIVER FUNCTION TESTS:  Recent Labs  12/03/16 1613  BILITOT 0.7  AST 19  ALT 13*  ALKPHOS 98  PROT 6.7  ALBUMIN 2.8*    Assessment and Plan: Status post drainage  of a periappendiceal abscess on 12/04/16; afebrile; WBC 12.1, hemoglobin stable, creatinine normal, drain fluid cultures growing enterococcus and rare candida- antbx per pharm; check follow-up CT scheduled for later today.   Electronically Signed: D. Rowe Robert 12/13/2016, 10:45 AM   I spent a total of 15 minutes at the the patient's bedside AND on the patient's hospital floor or unit, greater than 50% of which was counseling/coordinating care for pelvic abscess drain    Patient ID: Alisha Fisher, female   DOB: 1946/06/17, 71 y.o.   MRN: [id number]

## 2016-12-13 NOTE — Progress Notes (Signed)
Patient ID: Alisha Fisher, female   DOB: [birth year], 71 y.o.   MRN: [id number] Pt's f/u CT A/P today reveals:  1. Interval percutaneous drain of the right lower quadrant fluid collection, which has resolved. 2. Loculated ascites within the left side of the abdomen and pelvic cul-de-sac. These are similar to decreased in size since 12/03/2016. Suspicious for infected ascites. 3. Similar appearance of the lung bases, with pleural fluid and bibasilar consolidation. Please see chest CT of 12/08/2016 for description of probable right sided neoplasm  Dr. Pascal Lux has reviewed latest images and discussed findings with [facility]. Pelvic fluid/left abd collections are similar in size as seen on previous scans. Pelvic fluid collection would be accessible from TG approach but distance is approx 15 cm from skin entry site. Pt also has sig third spacing. If [facility] feels it is warranted drain could be placed. Would cont current RLQ drain for now until output minimal.  [facility]

## 2016-12-13 NOTE — Progress Notes (Signed)
Physical Therapy Treatment Patient Details Name: Oswin Johal MRN: [id number] DOB: 03/24/1946 Today's Date: 12/13/2016    History of Present Illness Adama Ivins is a 71 y.o. female ,former smoker, with history of hypertension, COPD, who presented to ED 12/03/16 with 3 day history of right lower quadrant abdominal pain. Subsequent imaging revealed. Perforated appendicitis     PT Comments    Assisted OOB to [facility], assisted with hygiene then back to bed.  Pt declined amb.  Follow Up Recommendations  Home health PT;Supervision/Assistance - 24 hour     Equipment Recommendations       Recommendations for Other Services       Precautions / Restrictions Precautions Precautions: Fall Precaution Comments: right drain, on home oxygen Restrictions Weight Bearing Restrictions: No    Mobility  Bed Mobility Overal bed mobility: Needs Assistance Bed Mobility: Supine to Sit;Sit to Supine     Supine to sit: Mod assist Sit to supine: Mod assist   General bed mobility comments: mod assist for uppeer body (BMI) to lay down and mod assist B LE's up onto bed  Transfers Overall transfer level: Needs assistance Equipment used: None Transfers: Sit to/from Stand Sit to Stand: Supervision;Min guard         General transfer comment: assisted on/off BSC with assist for lines safety.    Ambulation/Gait             General Gait Details: Pt declined any amb   Stairs            Wheelchair Mobility    Modified Rankin (Stroke Patients Only)       Balance                                    Cognition Arousal/Alertness: Awake/alert Behavior During Therapy: WFL for tasks assessed/performed Overall Cognitive Status: Within Functional Limits for tasks assessed                      Exercises      General Comments        Pertinent Vitals/Pain Pain Assessment: Faces Faces Pain Scale: Hurts a little bit Pain Location: R side near drain with  activity sit to stand and stand to sit Pain Descriptors / Indicators: Discomfort;Grimacing Pain Intervention(s): Monitored during session    Home Living                      Prior Function            PT Goals (current goals can now be found in the care plan section) Progress towards PT goals: Progressing toward goals    Frequency           PT Plan Current plan remains appropriate    Co-evaluation             End of Session Equipment Utilized During Treatment: Oxygen Activity Tolerance:  (pt self limiting) Patient left: in bed;with call bell/phone within reach Nurse Communication: Mobility status PT Visit Diagnosis: Pain;Unsteadiness on feet (R26.81)     Time: 4132-4401 PT Time Calculation (min) (ACUTE ONLY): 13 min  Charges:  $Gait Training: 8-22 mins                    G Codes:       Rica Koyanagi  PTA [facility]  Acute  Rehab Pager      [phone]

## 2016-12-14 ENCOUNTER — Inpatient Hospital Stay (HOSPITAL_COMMUNITY): Payer: Medicare Other

## 2016-12-14 ENCOUNTER — Encounter (HOSPITAL_COMMUNITY): Payer: Self-pay | Admitting: *Deleted

## 2016-12-14 DIAGNOSIS — K352 Acute appendicitis with generalized peritonitis: Secondary | ICD-10-CM

## 2016-12-14 LAB — CBC
HCT: 34.4 % — ABNORMAL LOW (ref 36.0–46.0)
Hemoglobin: 11.5 g/dL — ABNORMAL LOW (ref 12.0–15.0)
MCH: 31.3 pg (ref 26.0–34.0)
MCHC: 33.4 g/dL (ref 30.0–36.0)
MCV: 93.5 fL (ref 78.0–100.0)
PLATELETS: 379 10*3/uL (ref 150–400)
RBC: 3.68 MIL/uL — AB (ref 3.87–5.11)
RDW: 14.5 % (ref 11.5–15.5)
WBC: 16.1 10*3/uL — ABNORMAL HIGH (ref 4.0–10.5)

## 2016-12-14 LAB — BASIC METABOLIC PANEL
Anion gap: 4 — ABNORMAL LOW (ref 5–15)
BUN: 6 mg/dL (ref 6–20)
CALCIUM: 8.3 mg/dL — AB (ref 8.9–10.3)
CO2: 32 mmol/L (ref 22–32)
CREATININE: 0.57 mg/dL (ref 0.44–1.00)
Chloride: 102 mmol/L (ref 101–111)
GFR calc Af Amer: >60 mL/min (ref >60)
Glucose, Bld: 114 mg/dL — ABNORMAL HIGH (ref 65–99)
POTASSIUM: 3.6 mmol/L (ref 3.5–5.1)
SODIUM: 138 mmol/L (ref 135–145)

## 2016-12-14 LAB — MAGNESIUM: MAGNESIUM: 1.2 mg/dL — AB (ref 1.7–2.4)

## 2016-12-14 MED ORDER — MAGNESIUM SULFATE 4 GM/100ML IV SOLN
4.0000 g | Freq: Once | INTRAVENOUS | Status: DC
  Filled 2016-12-14: qty 100

## 2016-12-14 MED ORDER — FAT EMULSION 20 % IV EMUL
240.0000 mL | INTRAVENOUS | Status: AC
  Administered 2016-12-14: 240 mL via INTRAVENOUS
  Filled 2016-12-14: qty 240

## 2016-12-14 MED ORDER — MIDAZOLAM HCL 2 MG/2ML IJ SOLN
INTRAMUSCULAR | Status: AC
  Filled 2016-12-14: qty 8

## 2016-12-14 MED ORDER — MIDAZOLAM HCL 2 MG/2ML IJ SOLN
INTRAMUSCULAR | Status: AC | PRN
  Administered 2016-12-14: 0.5 mg via INTRAVENOUS
  Administered 2016-12-14: 1 mg via INTRAVENOUS
  Administered 2016-12-14: 0.5 mg via INTRAVENOUS
  Administered 2016-12-14: 1 mg via INTRAVENOUS

## 2016-12-14 MED ORDER — FENTANYL CITRATE (PF) 100 MCG/2ML IJ SOLN
INTRAMUSCULAR | Status: AC | PRN
  Administered 2016-12-14 (×4): 50 ug via INTRAVENOUS

## 2016-12-14 MED ORDER — HEPARIN SODIUM (PORCINE) 5000 UNIT/ML IJ SOLN
5000.0000 [IU] | Freq: Three times a day (TID) | INTRAMUSCULAR | Status: DC
  Administered 2016-12-15 – 2016-12-16 (×4): 5000 [IU] via SUBCUTANEOUS
  Filled 2016-12-14 (×5): qty 1

## 2016-12-14 MED ORDER — FENTANYL CITRATE (PF) 100 MCG/2ML IJ SOLN
INTRAMUSCULAR | Status: AC
  Filled 2016-12-14: qty 6

## 2016-12-14 MED ORDER — MAGNESIUM SULFATE 2 GM/50ML IV SOLN
2.0000 g | Freq: Once | INTRAVENOUS | Status: DC
  Administered 2016-12-14: 10:00:00 2 g via INTRAVENOUS
  Filled 2016-12-14: qty 50

## 2016-12-14 MED ORDER — TRACE MINERALS CR-CU-MN-SE-ZN 10-1000-500-60 MCG/ML IV SOLN
INTRAVENOUS | Status: AC
  Administered 2016-12-14: 18:00:00 via INTRAVENOUS
  Filled 2016-12-14: qty 960

## 2016-12-14 MED ORDER — INSULIN ASPART 100 UNIT/ML ~~LOC~~ SOLN
0.0000 [IU] | SUBCUTANEOUS | Status: AC
  Administered 2016-12-15: 1 [IU] via SUBCUTANEOUS

## 2016-12-14 MED ORDER — DEXTROSE-NACL 5-0.9 % IV SOLN
INTRAVENOUS | Status: DC
  Administered 2016-12-15: 19:00:00 via INTRAVENOUS

## 2016-12-14 NOTE — Sedation Documentation (Signed)
Patient is resting comfortably. 

## 2016-12-14 NOTE — Progress Notes (Signed)
[place] NOTE   Pharmacy Consult for TPN Indication: prolonged PO intolerance  Patient Measurements: Height: '5\' 7"'$  (170.2 cm) Weight: 225 lb (102.1 kg) IBW/kg (Calculated) : 61.6 TPN AdjBW (KG): 71.7 Body mass index is 35.24 kg/m. Usual Weight: 102 kg on 3/5  Insulin Requirements: none ordered  Current Nutrition: essentially none, not taking supplements  IVF: D5NS at 100 ml/hr  Central access: PICC TPN start date: 3/16  ASSESSMENT                                                                                                          HPI: 47 yoF presenting with RLQ pain x 3 days. Found to have perforated appendicitis with multiple intra-abdominal abscess and admitted for percutaneous drainage.   Significant events:  3/16 - placed transgluteal/pelvic drain as original 3/6 drain output now appearing feculent and WBC rising.  Today, 12/14/2016:  Glucose - wnl while NPO  Electrolytes - Mg low, others   Renal - wnl  LFTs - pending  TGs - pending  Prealbumin - pending  NUTRITIONAL GOALS                                                                                             RD recs:  Kcal:  1800-2100kcal/day  Protein:  102-122g/day  Fluid:  >1.8L  Clinimix E 5/15 at a goal rate of 90 ml/hr + 20% fat emulsion at 20 ml/hr (over 12 hr) to provide: 108 g/day protein, 2013 Kcal/day.  PLAN                                                                                                                           Mag 4g IV x 1  This patient does not appear to be an appropriate candidate for TPN as GI tract is functional and patient simply refusing supplements because she dislikes them. Would this patient be appropriate for a trial of enteral feeding via alternative route (eg NGT/OGT)? The benefits of TPN likely do not outweigh the risks in this patient, and supplies are currently on critical shortage  At 1800 today:  Start  Clinimix E  5/15 at 40 ml/hr.  20% fat emulsion at 20 ml/hr.  Plan to advance as tolerated to the goal rate.  TPN to contain standard multivitamins; will add trace elements MWF.  Reduce IVF to 25m/hr.  Add sensitive SSI with q4 hr checks  TPN lab panels on Mondays & Thursdays  Bmet, Phos, Mag x 3 days; per dietitian patient is high risk for refeeding  DReuel Boom PharmD, BCPS Pager: [phone]/16/2018, 4:28 PM

## 2016-12-14 NOTE — Progress Notes (Signed)
Patient ID: Alisha Fisher, female   DOB: April 22, 1946, 71 y.o.   MRN: [id number]    Referring Physician(s): Dr. Autumn Messing  Supervising Physician: Daryll Brod  Patient Status: [facility] - In-pt  Chief Complaint: RLQ abscess  Subjective: Patient just got ativan.  Denies any pain currently.  Coughing a lot   Allergies: Ciprofloxacin; Flagyl [metronidazole]; and Zosyn [piperacillin sod-tazobactam so]  Medications: Prior to Admission medications   Medication Sig Start Date End Date Taking? Authorizing Provider  ADVAIR DISKUS 250-50 MCG/DOSE AEPB Inhale 1 puff into the lungs Twice daily. 03/10/12  Yes Historical Provider, MD  albuterol (PROVENTIL HFA;VENTOLIN HFA) 108 (90 Base) MCG/ACT inhaler Inhale 2 puffs into the lungs every 6 (six) hours as needed for wheezing or shortness of breath.   Yes Historical Provider, MD  ALPRAZolam Duanne Moron) 0.5 MG tablet Take 0.5 mg by mouth 3 (three) times daily as needed for anxiety.  11/21/16  Yes Historical Provider, MD  amLODipine (NORVASC) 10 MG tablet Take 10 mg by mouth daily. 09/11/16  Yes Historical Provider, MD  Cholecalciferol (VITAMIN D PO) Take 4,000-5,000 Units by mouth daily.   Yes Historical Provider, MD  citalopram (CELEXA) 10 MG tablet Take 10 mg by mouth daily. 11/21/16  Yes Historical Provider, MD  losartan-hydrochlorothiazide (HYZAAR) 100-25 MG per tablet Take 1 tablet by mouth daily. 05/04/14  Yes Belva Crome, MD  metoprolol (LOPRESSOR) 100 MG tablet Take 1.5 tablets (150 mg total) by mouth daily. 07/14/13  Yes Belva Crome, MD  amLODipine (NORVASC) 5 MG tablet Take 1 tablet (5 mg total) by mouth daily. Patient not taking: Reported on 12/03/2016 07/14/13   Belva Crome, MD    Vital Signs: BP (!) 158/80 (BP Location: Left Arm)   Pulse 90   Temp 98.4 F (36.9 C) (Oral)   Resp 19   Ht '5\' 7"'$  (1.702 m)   Wt 225 lb (102.1 kg)   SpO2 90%   BMI 35.24 kg/m   Physical Exam: abd: soft, minimally tender right now, obese, RLQ drain with minimal  output, but output that is present is tan, brown consistent with possible fistula. Heart: regular Lungs: CTAB, upper airway noise noted  Imaging: [place] Chest  Result Date: 12/11/2016 CLINICAL DATA:  Patient with history of right lung endobronchial mass, perforated appendicitis with recent abscess drainage, small pleural effusions. Request made for right thoracentesis . EXAM: CHEST ULTRASOUND COMPARISON:  CT chest done 12/08/2016 FINDINGS: Limited ultrasound right posterior chest region reveals a very small loculated pleural effusion. IMPRESSION: Limited ultrasound right posterior chest today reveals a very small loculated pleural effusion. Options of proceeding with diagnostic right thoracentesis versus obtaining follow-up assessment of fluid volume in a few days for thoracentesis were discussed with patient. She wishes to postpone thoracentesis today and re-evaluate fluid volume in a few days. Procedure not performed. Read by: Rowe Robert, PA-C Electronically Signed   By: Jacqulynn Cadet M.D.   On: 12/11/2016 10:36   Ct Abdomen Pelvis W Contrast  Result Date: 12/13/2016 CLINICAL DATA:  Status post drain placement for ruptured appendix. Right lower quadrant pain. Right lung endobronchial mass. EXAM: CT ABDOMEN AND PELVIS WITH CONTRAST TECHNIQUE: Multidetector CT imaging of the abdomen and pelvis was performed using the standard protocol following bolus administration of intravenous contrast. CONTRAST:  111m ISOVUE-300 IOPAMIDOL (ISOVUE-300) INJECTION 61%, 154mISOVUE-300 IOPAMIDOL (ISOVUE-300) INJECTION 61% COMPARISON:  12/03/2016. FINDINGS: Lower chest: Right worse than left base airspace disease with heterogeneous right lung attenuation, likely related to the known central obstruction.  Small bilateral pleural effusions are similar to 12/08/2016. Moderate cardiomegaly. Hepatobiliary: Cirrhosis, without focal liver lesion. Normal gallbladder, without biliary ductal dilatation. Pancreas: Normal, without  mass or ductal dilatation. Spleen: Normal in size, without focal abnormality. Adrenals/Urinary Tract: Normal adrenal glands. Normal kidneys, without hydronephrosis. Normal urinary bladder. Stomach/Bowel: Normal stomach, without wall thickening. Normal colon and terminal ileum. The appendix is no longer confidently identified. Normal small bowel caliber. Vascular/Lymphatic: Advanced aortic and branch vessel atherosclerosis. Patent portal and splenic veins. No evidence of portal venous hypertension. No abdominopelvic adenopathy. Reproductive: Normal uterus.  No adnexal mass. Other: Similar small volume perihepatic ascites. Loculated ascites with peritoneal thickening in the anterior left abdomen. Example 14.5 x 1.8 cm on image 47/series 2. This is similar to decreased from 17.1 x 2.7 cm on the prior. Percutaneous drain within the right lower quadrant. No surrounding fluid collection identified. Cul-de-sac fluid with peritoneal thickening. Collection measures 6.8 x 3.6 cm on image 75/series 2. This is similar in size on the prior exam. The peritoneal thickening is new or increased. contiguous trace left pelvic fluid on image 76/series 2. Anasarca. 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 Interval percutaneous drain of the right lower quadrant fluid collection, which has resolved. 2. Loculated ascites within the left side of the abdomen and pelvic cul-de-sac. These are similar to decreased in size since 12/03/2016. Suspicious for infected ascites. 3. Similar appearance of the lung bases, with pleural fluid and bibasilar consolidation. Please see chest CT of 12/08/2016 for description of probable right sided neoplasm. Electronically Signed   By: Abigail Miyamoto M.D.   On: 12/13/2016 12:16    Labs:  CBC:  Recent Labs  12/11/16 0458 12/12/16 0440 12/13/16 0314 12/14/16 0412  WBC 15.0* 12.8* 12.1* 16.1*  HGB 11.5* 11.7* 11.1* 11.5*  HCT 34.8* 35.9* 34.1* 34.4*  PLT 421* 405* 421* 379     COAGS:  Recent Labs  12/04/16 1005  INR 1.15    BMP:  Recent Labs  12/10/16 0420 12/11/16 0458 12/12/16 0644 12/13/16 0314 12/14/16 0412  NA 136  --  132* 141 138  K 4.2  --  4.1 3.6 3.6  CL 106  --  99* 105 102  CO2 27  --  29 32 32  GLUCOSE 95  --  91 105* 114*  BUN 7  --  <5* 6 6  CALCIUM 8.1*  --  7.6* 8.5* 8.3*  CREATININE 0.64  0.62 0.57 0.62 0.58 0.57  GFRNONAA >60  >60 >60 >60 >60 >60  GFRAA >60  >60 >60 >60 >60 >60    LIVER FUNCTION TESTS:  Recent Labs  12/03/16 1613  BILITOT 0.7  AST 19  ALT 13*  ALKPHOS 98  PROT 6.7  ALBUMIN 2.8*    Assessment and Plan: 1. Perforated appendicitis with multiple intra-abdominal abscesses, s/p perc drain on 3/6 -the patient's current drain output appears to have changed to more feculent appearing drainage, albeit, still not much.  This will certainly need to remain in place if that is the case and will likely need a drain injection to confirm or deny this suspicion. -Dr. Annamaria Boots has reviewed her CT scan and approves the patient for a transgluteal/pelvic drain to be placed as her WBC are going up, despite this has not significantly changed since her last CT scan. -we will try to proceed today if possible, but if not, then tomorrow. -cont NPO for now and hold heparin for procedure.  Electronically Signed: Henreitta Cea 12/14/2016, 11:00 AM  I spent a total of 25 Minutes at the the patient's bedside AND on the patient's hospital floor or unit, greater than 50% of which was counseling/coordinating care for multiple abdominal abscesses

## 2016-12-14 NOTE — Progress Notes (Signed)
Physical Therapy Treatment Patient Details Name: Alisha Fisher MRN: [id number] DOB: May 12, 1946 Today's Date: 12/14/2016    History of Present Illness Alisha Fisher is a 71 y.o. female ,former smoker, with history of hypertension, COPD, who presented to ED 12/03/16 with 3 day history of right lower quadrant abdominal pain. Subsequent imaging revealed. Perforated appendicitis     PT Comments    The patient is bed and states that she had a B/B accident. Found to have large loose BM. Assisted to [facility] and into recliner. Very slow progress is being made. May need to consider SNF for rehab at [place]. Limited family support and requiring assistance. Continue PT while in acute care.  Follow Up Recommendations  SNF;Supervision/Assistance - 24 hour     Equipment Recommendations  Rolling walker with 5" wheels    Recommendations for Other Services       Precautions / Restrictions Precautions Precautions: Fall Precaution Comments: right drain, on home oxygen, incontinence B/B    Mobility  Bed Mobility Overal bed mobility: Needs Assistance Bed Mobility: Supine to Sit     Supine to sit: Mod assist     General bed mobility comments: mod assist for uppeer body (BMI) sit up after much encouragement. the patient reported that she had had a BM and unrinated in bed.    Transfers Overall transfer level: Needs assistance Equipment used: Rolling walker (2 wheeled) Transfers: Sit to/from [facility] Sit to Stand: Min assist Stand pivot transfers: Min assist       General transfer comment: assisted on/off BSC with assist for lines safety.   Stood for pericare x 2 minutes then took 8 steps around to get to recliner using RW and oxygen  Ambulation/Gait                 Financial trader Rankin (Stroke Patients Only)       Balance                                    Cognition Arousal/Alertness: Awake/alert Behavior  During Therapy: Restless                        Exercises      General Comments        Pertinent Vitals/Pain Pain Assessment: Faces Faces Pain Scale: Hurts a little bit Pain Location: R side near drain with activity sit to stand and stand to sit Pain Descriptors / Indicators: Discomfort;Grimacing Pain Intervention(s): Monitored during session    Home Living                      Prior Function            PT Goals (current goals can now be found in the care plan section) Progress towards PT goals: Not progressing toward goals - comment (appears fluctuations in ability to progress.)    Frequency    Min 3X/week      PT Plan Current plan remains appropriate;Discharge plan needs to be updated    Co-evaluation             End of Session Equipment Utilized During Treatment: Oxygen Activity Tolerance: Patient limited by fatigue Patient left: in chair;with call bell/phone within reach;with chair alarm set Nurse Communication: Mobility status PT Visit Diagnosis: Pain;Unsteadiness on  feet (R26.81) Pain - Right/Left: Right     Time: 2297-9892 PT Time Calculation (min) (ACUTE ONLY): 26 min  Charges:  $Therapeutic Activity: 8-22 mins $Self Care/Home Management: June 14, 2023                    G Codes:       Claretha Cooper 12/14/2016, 1:25 PM Tresa Endo PT [phone]

## 2016-12-14 NOTE — Procedures (Signed)
Post op pelvic fld collection  S/P CT PELVIC DRAIN VIA LEFT TRANSGLUTEAL APPROACH  No comp Stable EBL 0 Full report in PACS

## 2016-12-14 NOTE — Progress Notes (Signed)
Patient ID: Alisha Fisher, female   DOB: 04-29-1946, 71 y.o.   MRN: [id number]  [facility] Surgery Progress Note     Subjective: Patient complains of persistent mild abdominal pain today. Feels a little bloated. She did have a BM yesterday. States that she is not taking in very much of her diet, she does not have an appetite. She has not eaten anything this morning. Denies any current SOB but does report persistent dyspnea on exertion.  Objective: Vital signs in last 24 hours: Temp:  [98.4 F (36.9 C)-98.9 F (37.2 C)] 98.4 F (36.9 C) (03/16 0700) Pulse Rate:  [90-92] 90 (03/16 0700) Resp:  [18-19] 19 (03/16 0700) BP: (158-163)/(78-80) 158/80 (03/16 0700) SpO2:  [92 %-96 %] 94 % (03/16 0700) Last BM Date: 12/13/16  Intake/Output from previous day: 03/15 0701 - 03/16 0700 In: 2925 [P.O.:60; I.V.:2810; IV Piggyback:50] Out: 1110 [Urine:1100; Drains:10] Intake/Output this shift: Total I/O In: 120 [P.O.:120] Out: 2.5 [Drains:2.5]  PE: Gen: Alert, NAD, pleasant Card: RRR, no M/G/R heard Pulm: CTAB,no wheezes,effort normal, on [place] O2 Abd: Soft, mild distension, +BS, no HSM, RLQ drain with no fluid in bulb, mild suprapubic, RLQ, and LLQ tenderness Skin: diffuse rash nearly resolved  Lab Results:   Recent Labs  12/13/16 0314 12/14/16 0412  WBC 12.1* 16.1*  HGB 11.1* 11.5*  HCT 34.1* 34.4*  PLT 421* 379   BMET  Recent Labs  12/13/16 0314 12/14/16 0412  NA 141 138  K 3.6 3.6  CL 105 102  CO2 32 32  GLUCOSE 105* 114*  BUN 6 6  CREATININE 0.58 0.57  CALCIUM 8.5* 8.3*   PT/INR No results for input(s): LABPROT, INR in the last 72 hours. CMP     Component Value Date/Time   NA 138 12/14/2016 0412   K 3.6 12/14/2016 0412   CL 102 12/14/2016 0412   CO2 32 12/14/2016 0412   GLUCOSE 114 (H) 12/14/2016 0412   BUN 6 12/14/2016 0412   CREATININE 0.57 12/14/2016 0412   CALCIUM 8.3 (L) 12/14/2016 0412   PROT 6.7 12/03/2016 1613   ALBUMIN 2.8 (L) 12/03/2016  1613   AST 19 12/03/2016 1613   ALT 13 (L) 12/03/2016 1613   ALKPHOS 98 12/03/2016 1613   BILITOT 0.7 12/03/2016 1613   GFRNONAA >60 12/14/2016 0412   GFRAA >60 12/14/2016 0412   Lipase     Component Value Date/Time   LIPASE 15 12/03/2016 1613       Studies/Results: Ct Abdomen Pelvis W Contrast  Result Date: 12/13/2016 CLINICAL DATA:  Status post drain placement for ruptured appendix. Right lower quadrant pain. Right lung endobronchial mass. EXAM: CT ABDOMEN AND PELVIS WITH CONTRAST TECHNIQUE: Multidetector CT imaging of the abdomen and pelvis was performed using the standard protocol following bolus administration of intravenous contrast. CONTRAST:  132m ISOVUE-300 IOPAMIDOL (ISOVUE-300) INJECTION 61%, 159mISOVUE-300 IOPAMIDOL (ISOVUE-300) INJECTION 61% COMPARISON:  12/03/2016. FINDINGS: Lower chest: Right worse than left base airspace disease with heterogeneous right lung attenuation, likely related to the known central obstruction. Small bilateral pleural effusions are similar to 12/08/2016. Moderate cardiomegaly. Hepatobiliary: Cirrhosis, without focal liver lesion. Normal gallbladder, without biliary ductal dilatation. Pancreas: Normal, without mass or ductal dilatation. Spleen: Normal in size, without focal abnormality. Adrenals/Urinary Tract: Normal adrenal glands. Normal kidneys, without hydronephrosis. Normal urinary bladder. Stomach/Bowel: Normal stomach, without wall thickening. Normal colon and terminal ileum. The appendix is no longer confidently identified. Normal small bowel caliber. Vascular/Lymphatic: Advanced aortic and branch vessel atherosclerosis. Patent portal and splenic  veins. No evidence of portal venous hypertension. No abdominopelvic adenopathy. Reproductive: Normal uterus.  No adnexal mass. Other: Similar small volume perihepatic ascites. Loculated ascites with peritoneal thickening in the anterior left abdomen. Example 14.5 x 1.8 cm on image 47/series 2. This is  similar to decreased from 17.1 x 2.7 cm on the prior. Percutaneous drain within the right lower quadrant. No surrounding fluid collection identified. Cul-de-sac fluid with peritoneal thickening. Collection measures 6.8 x 3.6 cm on image 75/series 2. This is similar in size on the prior exam. The peritoneal thickening is new or increased. contiguous trace left pelvic fluid on image 76/series 2. Anasarca. 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 Interval percutaneous drain of the right lower quadrant fluid collection, which has resolved. 2. Loculated ascites within the left side of the abdomen and pelvic cul-de-sac. These are similar to decreased in size since 12/03/2016. Suspicious for infected ascites. 3. Similar appearance of the lung bases, with pleural fluid and bibasilar consolidation. Please see chest CT of 12/08/2016 for description of probable right sided neoplasm. Electronically Signed   By: Abigail Miyamoto M.D.   On: 12/13/2016 12:16    Anti-infectives: Anti-infectives    Start     Dose/Rate Route Frequency Ordered Stop   12/14/16 0600  vancomycin (VANCOCIN) 1,250 mg in sodium chloride 0.9 % 250 mL IVPB     1,250 mg 166.7 mL/hr over 90 Minutes Intravenous Every 12 hours 12/13/16 1916     12/10/16 1400  vancomycin (VANCOCIN) IVPB 1000 mg/200 mL premix  Status:  Discontinued     1,000 mg 100 mL/hr over 120 Minutes Intravenous Every 12 hours 12/10/16 1059 12/13/16 1916   12/08/16 1800  ertapenem (INVANZ) 1 g in sodium chloride 0.9 % 50 mL IVPB     1 g 100 mL/hr over 30 Minutes Intravenous Every 24 hours 12/08/16 1512     12/08/16 1600  vancomycin (VANCOCIN) IVPB 750 mg/150 ml premix  Status:  Discontinued     750 mg 100 mL/hr over 90 Minutes Intravenous Every 12 hours 12/08/16 1513 12/10/16 1059   12/07/16 2300  vancomycin (VANCOCIN) IVPB 750 mg/150 ml premix  Status:  Discontinued     750 mg 150 mL/hr over 60 Minutes Intravenous Every 12 hours 12/07/16 1103 12/08/16  1513   12/07/16 2200  ciprofloxacin (CIPRO) IVPB 400 mg  Status:  Discontinued     400 mg 200 mL/hr over 60 Minutes Intravenous Every 12 hours 12/07/16 1122 12/08/16 1512   12/07/16 1115  vancomycin (VANCOCIN) 2,000 mg in sodium chloride 0.9 % 500 mL IVPB     2,000 mg 250 mL/hr over 120 Minutes Intravenous  Once 12/07/16 1103 12/07/16 1440   12/06/16 2200  ciprofloxacin (CIPRO) IVPB 400 mg  Status:  Discontinued     400 mg 200 mL/hr over 60 Minutes Intravenous Every 12 hours 12/06/16 1409 12/07/16 1039   12/06/16 1400  piperacillin-tazobactam (ZOSYN) IVPB 3.375 g  Status:  Discontinued     3.375 g 12.5 mL/hr over 240 Minutes Intravenous Every 8 hours 12/06/16 0643 12/06/16 0825   12/06/16 1000  ciprofloxacin (CIPRO) IVPB 200 mg  Status:  Discontinued     200 mg 100 mL/hr over 60 Minutes Intravenous Every 12 hours 12/06/16 0825 12/06/16 1409   12/06/16 1000  metroNIDAZOLE (FLAGYL) IVPB 500 mg  Status:  Discontinued     500 mg 100 mL/hr over 60 Minutes Intravenous Every 8 hours 12/06/16 0825 12/08/16 1512   12/04/16 0630  piperacillin-tazobactam (ZOSYN) 3.375  g in dextrose 5 % 50 mL IVPB  Status:  Discontinued     3.375 g 12.5 mL/hr over 240 Minutes Intravenous Every 8 hours 12/04/16 0623 12/06/16 0643   12/03/16 2130  piperacillin-tazobactam (ZOSYN) IVPB 3.375 g  Status:  Discontinued     3.375 g 12.5 mL/hr over 240 Minutes Intravenous Every 8 hours 12/03/16 2125 12/04/16 0623   12/03/16 1915  piperacillin-tazobactam (ZOSYN) IVPB 3.375 g     3.375 g 100 mL/hr over 30 Minutes Intravenous  Once 12/03/16 1907 12/03/16 2005       Assessment/Plan Ruptured appendicitis - no abdominal surgical history - CT scan showed perforated appendicitis with 2.9 x 5.8 x 5 cm RIGHT lower quadrant contained perforation/abscess - drain with 10cc/24hr purulent drainage  - s/p IR drain 3/6, culture: enterococcus faecalis  - WBC up to 16.1 today, afebrile  Rash - improving  Bilateral pleural  effusions- thoracentesis not performed 3/13 due to limited amount of fluid COPD - on O2 at home. on Dulera 2 puffs twice a day. Patient will need repeat pulmonary function testing as an outpatient as well as screening for alpha-1 antitrypsin deficiency RIGHT lower lobe mass/ Right-sided endobronchial obstruction- per CCM, If pleural fluid is unrevealing or cannot be obtained the next logical step would be a bronchoscopy with airway inspection and endobronchial ultrasound-guided fine-needle aspiration for appropriate mediastinal lymph node staging Recent pneumonia- earlier this year HTN - metoprolol, norvasc, hydralazine PRN Former smoker- quit earlier this year  ID - zosyn 3/5>>3/8, cipro 3/8>>3/10, flagyl 3/8>>3/10, vanco 3/9>>, invanz 3/10>> FEN - IVF, NPO for possible procedure VTE - SCDs, heparin  Plan - Discussed with Dr. Harlow Asa. Due to elevated WBC and persistent abdominal pain, will consult IR for per drain. NPO for possible procedure; if unable to perform today she may have a soft diet and be make NPO after midnight. Continue IV antibiotics. Replace magnesium. Pulmonary issues per CCM.    LOS: 11 days    Jerrye Beavers , [facility] Surgery 12/14/2016, 8:03 AM Pager: [phone] Consults: [phone] Mon-Fri 7:00 am-4:30 pm Sat-Sun 7:00 am-11:30 am

## 2016-12-14 NOTE — Progress Notes (Addendum)
Nutrition Follow-up  DOCUMENTATION CODES:   Obesity unspecified  INTERVENTION:   RD will discontinue Boost Breeze as pt does not like them.   TPN per Pharmacy   Pt is at high refeeding risk recommend monitor Phosphorus, Magnesium, and Potassium for three days after initiating TPN   Recommend thiamine '100mg'$  daily for three days after TPN initiation.   NUTRITION DIAGNOSIS:   Inadequate oral intake related to inability to eat as evidenced by NPO status. Continues   GOAL:   Patient will meet greater than or equal to 90% of their needs  MONITOR:   PO intake, Supplement acceptance, Labs, Weight trends    ASSESSMENT:   71 y.o. female presented to the ED on 12/03/16 with c/o RLQ pain.  Abd CT showed perforated appendicitis with contained perforation/abscess -- s/p IR drain on 12/04/16.  Zosyn was started for broad coverage.  Patient reported rash and itching on 12/05/16 and this was suspected to be related to zosyn.  Zosyn d/ced on 12/06/16 and changed to cipro and flagyl.  On 12/07/16, pt's rash appeared to be getting worse after cipro started.  Abscess culture from 3/6/18now back with enterococcus faecalis.  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 Pt continues to have poor oral intake and has not had adequate oral intake for at least 10 days. RD spoke to [facility] about initiating TPN for this patient as pt can not tolerate oral diet at this time. Pt with PICC line in place already. Pt does not like any supplements. No new weight on this pt since 3/5. RD asked RN and nurse tech to obtain weight on this pt today. Plan is for pt to have drain placed in IR likely tomorrow. RD will continue to monitor for nutritional needs. Monitor for refeeding; pt at high risk.      Medications reviewed and include: colace, heparin, Mg sulfate, protonix, miralax, vancomycin NaCl w/ dextrose, hydromorphone   Labs reviewed: K 3.6 wnl,  Ca 8.3(L), P 4.6 wnl 3/14, Mg 1.2(L) Wbc- 16.1(H)  Diet Order:  Diet NPO time specified Except for: Sips with Meds  Skin:  Wound (see comment) (abdominal drains)  Last BM:  3/15  Height:   Ht Readings from Last 1 Encounters:  12/03/16 '5\' 7"'$  (1.702 m)    Weight:   Wt Readings from Last 1 Encounters:  12/03/16 225 lb (102.1 kg)    Ideal Body Weight:  61.3 kg  BMI:  Body mass index is 35.24 kg/m.  Estimated Nutritional Needs:   Kcal:  1800-2100kcal/day   Protein:  102-122g/day   Fluid:  >1.8L  EDUCATION NEEDS:   No education needs identified at this time  Koleen Distance, RD, LDN Pager #[phone]

## 2016-12-14 NOTE — Consult Note (Signed)
Name: Alisha Fisher MRN: [id number] DOB: [birth year]    ADMISSION DATE:  12/03/2016 CONSULTATION DATE:  3/12  REFERRING MD :  gerkin  CHIEF COMPLAINT:  Lung mass  Brief 71 year old female w/ sig h/o COPD, obesity, probable OSA and recent hospitalization for PNA (feb 2018). Recently diagnosed lung mass during that stay. Admitted 3/5 w ruptured appendix. Right LLL lung mass again identified. PCCM asked to help w/ coordination of care.   SIGNIFICANT EVENTS  3/5 admitted w/ abd pain found to have ruptured appendix  3/6 Perc drain placed. Culture + Moderate Enterococcus Faecalis    STUDIES:  CT chest: 1. Persistent soft tissue density occlusion of the right lower and right middle lobe bronchi with complete right lower lobe atelectasis and worsening right middle lobe atelectasis. 2. Stable right hilar, subcarinal and right paratracheal lymphadenopathy. New symmetric mild axillary lymphadenopathy.  3. Irregular 2.1 cm solid medial left upper lobe pulmonary nodule, stable since 11/14/2016, suspicious for contralateral pulmonary metastasis versus synchronous primary bronchogenic carcinoma . 4. Mild emphysema with mild diffuse bronchial wall thickening, suggesting COPD . 5 Stable ectasia of the ascending thoracic aorta, maximum diameter 4.2 cm. 9. Stable prominently dilated main pulmonary artery, suggesting pulmonary arterial hypertension.   HPI :  This is a 71 year old female who was admitted on 3/5 w/ ruptured appendix and associated peritonitis/abscess. During diagnostic evaluation on admit was also found to have right Lower Lobe Lung mass. This was initially identified during a hospital stay at [facility] when she was admitted for a Pneumonia & initially identified via CT scan on 11/14/16. She apparently had follow up w/ Dr Alcide Clever this month but she would like to be seen here for evaluation. Pulmonary history: seen By Dr Chase Caller last  8/13.  -Her PFTs at that time showed mix of  obstructive and restrictive lung disease. 7/13: FEV1 was 1.1 (47% predicted) and TLC 75%, DLCO 63%. He felt that this was due to a mix of COPD and obesity.  -sleep apnea; not clear where, when or if this was actually tested. 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  Pulmonary was asked to see in-house to help coordinate a plan for her lung mass evaluation.   PAST MEDICAL HISTORY :   has a past medical history of COPD, severe ([place]); HTN (hypertension); Pneumonia; Pulmonary emphysema ([place]); and SOB (shortness of breath).  has a past surgical history that includes Cataract extraction. Prior to Admission medications   Medication Sig Start Date End Date Taking? Authorizing Provider  ADVAIR DISKUS 250-50 MCG/DOSE AEPB Inhale 1 puff into the lungs Twice daily. 03/10/12  Yes Historical Provider, MD  albuterol (PROVENTIL HFA;VENTOLIN HFA) 108 (90 Base) MCG/ACT inhaler Inhale 2 puffs into the lungs every 6 (six) hours as needed for wheezing or shortness of breath.   Yes Historical Provider, MD  ALPRAZolam Duanne Moron) 0.5 MG tablet Take 0.5 mg by mouth 3 (three) times daily as needed for anxiety.  11/21/16  Yes Historical Provider, MD  amLODipine (NORVASC) 10 MG tablet Take 10 mg by mouth daily. 09/11/16  Yes Historical Provider, MD  Cholecalciferol (VITAMIN D PO) Take 4,000-5,000 Units by mouth daily.   Yes Historical Provider, MD  citalopram (CELEXA) 10 MG tablet Take 10 mg by mouth daily. 11/21/16  Yes Historical Provider, MD  losartan-hydrochlorothiazide (HYZAAR) 100-25 MG per tablet Take 1 tablet by mouth daily. 05/04/14  Yes Belva Crome, MD  metoprolol (LOPRESSOR) 100 MG tablet Take 1.5 tablets (150 mg total) by mouth daily. 07/14/13  Yes Belva Crome, MD  amLODipine (NORVASC) 5 MG tablet Take 1 tablet  (5 mg total) by mouth daily. Patient not taking: Reported on 12/03/2016 07/14/13   Belva Crome, MD   Allergies  Allergen Reactions  . Ciprofloxacin Rash    Rash possibly caused by Cipro?  . Flagyl [Metronidazole] Rash    Rash possibly caused by Flagyl?  Marland Kitchen Zosyn [Piperacillin Sod-Tazobactam So] Itching and Rash    Has patient had a PCN reaction causing immediate rash, facial/tongue/throat swelling, SOB or lightheadedness with hypotension: No Has patient had a PCN reaction causing severe rash involving mucus membranes or skin necrosis: No Has patient had a PCN reaction that required hospitalization: No Has patient had a PCN reaction occurring within the last 10 years: Yes If all of the above answers are "NO", then may proceed with Cephalosporin use.     FAMILY HISTORY:  family history includes Breast cancer in her mother; COPD in her cousin; Diabetes in her father and son. SOCIAL HISTORY:  reports that she quit smoking about 6 weeks ago. Her smoking use included Cigarettes. She has a 25.00 pack-year smoking history. She has never used smokeless tobacco. She reports that she does not drink alcohol or use drugs.  Review of Systems:   Bolds are positive  Constitutional: Denies weight loss, gain, night sweats, Fevers, chills, fatigue .  HEENT: headaches, Sore throat, sneezing, nasal congestion, post nasal drip, Difficulty swallowing, Tooth/dental problems, visual complaints visual changes, ear ache CV:  Denies chest pain, radiates,Orthopnea, PND, dizziness, palpitations, syncope.  GI  heartburn, indigestion, abdominal pain, nausea, vomiting, diarrhea, change in bowel habits, loss of appetite, bloody stools.  Resp: No cough, productive: , hemoptysis, chest pain, pleuritic. C/O Stable dyspnea on exertion Skin: rash or itching or icterus GU: No dysuria, change in color of urine, urgency or frequency. flank pain, hematuria  MS: No joint pain or swelling. decreased range of motion  Psych: No  change in mood or affect. depression or anxiety.  Neuro: No difficulty with speech, weakness, numbness, ataxia   SUBJECTIVE:  Unable to do thoracentesis yesterday as fluids pocket was small. No major events overnight.    VITAL SIGNS: Temp:  [98.3 F (36.8 C)-98.9 F (37.2 C)] 98.3 F (36.8 C) (03/16 1332) Pulse Rate:  [80-92] 92 (03/16 1726) Resp:  [16-25] 16 (03/16 1726) BP: (150-176)/(70-116) 165/70 (03/16 1726) SpO2:  [90 %-95 %] 95 % (03/16 1726)  PHYSICAL EXAMINATION:. Gen:      No acute distress HEENT:  EOMI, sclera anicteric Neck:     No masses; no thyromegaly Lungs:    Clear to auscultation bilaterally; normal respiratory effort CV:         Regular rate and rhythm; no murmurs Abd:      + bowel sounds; obese, non tender. RLQ perc drain Ext:    No edema; adequate peripheral perfusion Neuro: alert and oriented x 3   Recent Labs Lab 12/12/16 0644 12/13/16 0314 12/14/16 0412  NA 132* 141 138  K 4.1 3.6 3.6  CL 99* 105 102  CO2 29 32 32  BUN <5* 6 6  CREATININE 0.62 0.58 0.57  GLUCOSE 91 105* 114*    Recent Labs Lab 12/12/16 0440 12/13/16 0314 12/14/16 0412  HGB 11.7*  11.1* 11.5*  HCT 35.9* 34.1* 34.4*  WBC 12.8* 12.1* 16.1*  PLT 405* 421* 379   Ct Abdomen Pelvis W Contrast  Result Date: 12/13/2016 CLINICAL DATA:  Status post drain placement for ruptured appendix. Right lower quadrant pain. Right lung endobronchial mass. EXAM: CT ABDOMEN AND PELVIS WITH CONTRAST TECHNIQUE: Multidetector CT imaging of the abdomen and pelvis was performed using the standard protocol following bolus administration of intravenous contrast. CONTRAST:  128m ISOVUE-300 IOPAMIDOL (ISOVUE-300) INJECTION 61%, 149mISOVUE-300 IOPAMIDOL (ISOVUE-300) INJECTION 61% COMPARISON:  12/03/2016. FINDINGS: Lower chest: Right worse than left base airspace disease with heterogeneous right lung attenuation, likely related to the known central obstruction. Small bilateral pleural effusions are similar  to 12/08/2016. Moderate cardiomegaly. Hepatobiliary: Cirrhosis, without focal liver lesion. Normal gallbladder, without biliary ductal dilatation. Pancreas: Normal, without mass or ductal dilatation. Spleen: Normal in size, without focal abnormality. Adrenals/Urinary Tract: Normal adrenal glands. Normal kidneys, without hydronephrosis. Normal urinary bladder. Stomach/Bowel: Normal stomach, without wall thickening. Normal colon and terminal ileum. The appendix is no longer confidently identified. Normal small bowel caliber. Vascular/Lymphatic: Advanced aortic and branch vessel atherosclerosis. Patent portal and splenic veins. No evidence of portal venous hypertension. No abdominopelvic adenopathy. Reproductive: Normal uterus.  No adnexal mass. Other: Similar small volume perihepatic ascites. Loculated ascites with peritoneal thickening in the anterior left abdomen. Example 14.5 x 1.8 cm on image 47/series 2. This is similar to decreased from 17.1 x 2.7 cm on the prior. Percutaneous drain within the right lower quadrant. No surrounding fluid collection identified. Cul-de-sac fluid with peritoneal thickening. Collection measures 6.8 x 3.6 cm on image 75/series 2. This is similar in size on the prior exam. The peritoneal thickening is new or increased. contiguous trace left pelvic fluid on image 76/series 2. Anasarca. 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 Interval percutaneous drain of the right lower quadrant fluid collection, which has resolved. 2. Loculated ascites within the left side of the abdomen and pelvic cul-de-sac. These are similar to decreased in size since 12/03/2016. Suspicious for infected ascites. 3. Similar appearance of the lung bases, with pleural fluid and bibasilar consolidation. Please see chest CT of 12/08/2016 for description of probable right sided neoplasm. Electronically Signed   By: KyAbigail Miyamoto.D.   On: 12/13/2016 12:16   Ct Image Guided Drainage By  Percutaneous Catheter  Result Date: 12/14/2016 INDICATION: Postop pelvic fluid collection, status post perforated appendicitis, worsening white count EXAM: CT-GUIDED PELVIC FLUID COLLECTION DRAINAGE VIA A LEFT TRANS GLUTEAL APPROACH. MEDICATIONS: The patient is currently admitted to the hospital and receiving intravenous antibiotics. The antibiotics were administered within an appropriate time frame prior to the initiation of the procedure. ANESTHESIA/SEDATION: Fentanyl 200 mcg IV; Versed 3.0 mg IV Moderate Sedation Time:  21 minutes The patient was continuously monitored during the procedure by the interventional radiology nurse under my direct supervision. COMPLICATIONS: None. PROCEDURE: Informed written consent was obtained from the patient after a thorough discussion of the procedural risks, benefits and alternatives. All questions were addressed. Maximal Sterile Barrier Technique was utilized including caps, mask, sterile gowns, sterile gloves, sterile drape, hand hygiene and skin antiseptic. A timeout was performed prior to the initiation of the procedure. Previous imaging reviewed. Patient positioned nearly prone. Noncontrast localization CT performed. The left trans gluteal approach to the cul-de-sac pelvic fluid collection was localized. Overlying skin marked. Under sterile conditions and local anesthesia, a 17 gauge 16.8 cm access needle was advanced from a left trans gluteal posterior oblique approach into the fluid collection.  Needle position confirmed with CT. Guidewire inserted. Guidewire position confirmed with CT. Tract dilatation performed to insert a 10 [place] drain. Drain catheter positioned within the fluid collection. Position confirmed with CT. Catheter secured with a Prolene suture and connected to external suction bulb. Sterile dressing applied. No immediate complication. Patient tolerated the procedure well. IMPRESSION: Successful CT-guided pelvic fluid collection drain insertion via left  trans gluteal approach. Electronically Signed   By: Jerilynn Mages.  Shick M.D.   On: 12/14/2016 16:36   ASSESSMENT / PLAN: Lung mass w/ post obstructive atelectasis involving the RML and RLL.   Left upper lobe nodule Paratracheal, axillary and subcarinal adenopathy.  COPD  Obesity  Probable OSA.  Tobacco abuse   I have reviewed the images which show findings concerning for primary lung malignancy with metastases and mediastinal lymph node involvement. As we are unable to do a thoracentesis she will be scheduled for bronchoscopy with EBUS Monday 3/19 at 7:30 AM by Dr. Ashok Cordia I have discussed the risk benefit of the procedure with the patient and her sister Alisha Fisher. They're agreeable to proceed with bronchoscopy. Keep NPO after midnight on Sun.  Marshell Garfinkel MD [facility] Pager [phone] If no answer or after 3pm call: [phone] 12/14/2016, 6:12 PM

## 2016-12-15 DIAGNOSIS — Z9889 Other specified postprocedural states: Secondary | ICD-10-CM

## 2016-12-15 LAB — DIFFERENTIAL
BASOS ABS: 0 10*3/uL (ref 0.0–0.1)
BASOS PCT: 0 %
EOS ABS: 0.9 10*3/uL — AB (ref 0.0–0.7)
EOS PCT: 8 %
Lymphocytes Relative: 5 %
Lymphs Abs: 0.6 10*3/uL — ABNORMAL LOW (ref 0.7–4.0)
Monocytes Absolute: 0.4 10*3/uL (ref 0.1–1.0)
Monocytes Relative: 4 %
NEUTROS PCT: 83 %
Neutro Abs: 8.6 10*3/uL — ABNORMAL HIGH (ref 1.7–7.7)

## 2016-12-15 LAB — COMPREHENSIVE METABOLIC PANEL
ALT: 9 U/L — ABNORMAL LOW (ref 14–54)
ANION GAP: 6 (ref 5–15)
AST: 10 U/L — AB (ref 15–41)
Albumin: 1.9 g/dL — ABNORMAL LOW (ref 3.5–5.0)
Alkaline Phosphatase: 57 U/L (ref 38–126)
BUN: 8 mg/dL (ref 6–20)
CHLORIDE: 101 mmol/L (ref 101–111)
CO2: 30 mmol/L (ref 22–32)
Calcium: 8.1 mg/dL — ABNORMAL LOW (ref 8.9–10.3)
Creatinine, Ser: 0.65 mg/dL (ref 0.44–1.00)
Glucose, Bld: 99 mg/dL (ref 65–99)
POTASSIUM: 3.6 mmol/L (ref 3.5–5.1)
Sodium: 137 mmol/L (ref 135–145)
Total Bilirubin: 0.8 mg/dL (ref 0.3–1.2)
Total Protein: 4.8 g/dL — ABNORMAL LOW (ref 6.5–8.1)

## 2016-12-15 LAB — CBC
HCT: 32 % — ABNORMAL LOW (ref 36.0–46.0)
Hemoglobin: 10.6 g/dL — ABNORMAL LOW (ref 12.0–15.0)
MCH: 30.9 pg (ref 26.0–34.0)
MCHC: 33.1 g/dL (ref 30.0–36.0)
MCV: 93.3 fL (ref 78.0–100.0)
Platelets: 352 10*3/uL (ref 150–400)
RBC: 3.43 MIL/uL — AB (ref 3.87–5.11)
RDW: 14.7 % (ref 11.5–15.5)
WBC: 10.4 10*3/uL (ref 4.0–10.5)

## 2016-12-15 LAB — PHOSPHORUS: Phosphorus: 3.3 mg/dL (ref 2.5–4.6)

## 2016-12-15 LAB — TRIGLYCERIDES: TRIGLYCERIDES: 81 mg/dL (ref <150)

## 2016-12-15 LAB — MAGNESIUM: MAGNESIUM: 1.4 mg/dL — AB (ref 1.7–2.4)

## 2016-12-15 LAB — PREALBUMIN: PREALBUMIN: 5.6 mg/dL — AB (ref 18–38)

## 2016-12-15 MED ORDER — MAGNESIUM SULFATE 4 GM/100ML IV SOLN
4.0000 g | Freq: Once | INTRAVENOUS | Status: AC
  Administered 2016-12-15: 12:00:00 4 g via INTRAVENOUS
  Filled 2016-12-15: qty 100

## 2016-12-15 NOTE — Progress Notes (Signed)
Subjective: Perc drain placed yesterday Less pain today  Objective: Vital signs in last 24 hours: Temp:  [98 F (36.7 C)-98.6 F (37 C)] 98.6 F (37 C) (03/17 0501) Pulse Rate:  [80-92] 92 (03/17 0501) Resp:  [16-25] 16 (03/17 0501) BP: (150-179)/(69-116) 176/72 (03/17 0501) SpO2:  [90 %-98 %] 92 % (03/17 0501) Last BM Date: 12/13/16  Intake/Output from previous day: 03/16 0701 - 03/17 0700 In: 120 [P.O.:120] Out: 2.5 [Drains:2.5] Intake/Output this shift: No intake/output data recorded.  Exam: Comfortable in appearance Abdomen soft, drains with seropurulent fluid, Minimally tender  Lab Results:   Recent Labs  12/14/16 0412 12/15/16 0452  WBC 16.1* 10.4  HGB 11.5* 10.6*  HCT 34.4* 32.0*  PLT 379 352   BMET  Recent Labs  12/14/16 0412 12/15/16 0452  NA 138 137  K 3.6 3.6  CL 102 101  CO2 32 30  GLUCOSE 114* 99  BUN 6 8  CREATININE 0.57 0.65  CALCIUM 8.3* 8.1*   PT/INR No results for input(s): LABPROT, INR in the last 72 hours. ABG No results for input(s): PHART, HCO3 in the last 72 hours.  Invalid input(s): PCO2, PO2  Studies/Results: Ct Abdomen Pelvis W Contrast  Result Date: 12/13/2016 CLINICAL DATA:  Status post drain placement for ruptured appendix. Right lower quadrant pain. Right lung endobronchial mass. EXAM: CT ABDOMEN AND PELVIS WITH CONTRAST TECHNIQUE: Multidetector CT imaging of the abdomen and pelvis was performed using the standard protocol following bolus administration of intravenous contrast. CONTRAST:  127m ISOVUE-300 IOPAMIDOL (ISOVUE-300) INJECTION 61%, 154mISOVUE-300 IOPAMIDOL (ISOVUE-300) INJECTION 61% COMPARISON:  12/03/2016. FINDINGS: Lower chest: Right worse than left base airspace disease with heterogeneous right lung attenuation, likely related to the known central obstruction. Small bilateral pleural effusions are similar to 12/08/2016. Moderate cardiomegaly. Hepatobiliary: Cirrhosis, without focal liver lesion. Normal  gallbladder, without biliary ductal dilatation. Pancreas: Normal, without mass or ductal dilatation. Spleen: Normal in size, without focal abnormality. Adrenals/Urinary Tract: Normal adrenal glands. Normal kidneys, without hydronephrosis. Normal urinary bladder. Stomach/Bowel: Normal stomach, without wall thickening. Normal colon and terminal ileum. The appendix is no longer confidently identified. Normal small bowel caliber. Vascular/Lymphatic: Advanced aortic and branch vessel atherosclerosis. Patent portal and splenic veins. No evidence of portal venous hypertension. No abdominopelvic adenopathy. Reproductive: Normal uterus.  No adnexal mass. Other: Similar small volume perihepatic ascites. Loculated ascites with peritoneal thickening in the anterior left abdomen. Example 14.5 x 1.8 cm on image 47/series 2. This is similar to decreased from 17.1 x 2.7 cm on the prior. Percutaneous drain within the right lower quadrant. No surrounding fluid collection identified. Cul-de-sac fluid with peritoneal thickening. Collection measures 6.8 x 3.6 cm on image 75/series 2. This is similar in size on the prior exam. The peritoneal thickening is new or increased. contiguous trace left pelvic fluid on image 76/series 2. Anasarca. 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 Interval percutaneous drain of the right lower quadrant fluid collection, which has resolved. 2. Loculated ascites within the left side of the abdomen and pelvic cul-de-sac. These are similar to decreased in size since 12/03/2016. Suspicious for infected ascites. 3. Similar appearance of the lung bases, with pleural fluid and bibasilar consolidation. Please see chest CT of 12/08/2016 for description of probable right sided neoplasm. Electronically Signed   By: KyAbigail Miyamoto.D.   On: 12/13/2016 12:16   Ct Image Guided Drainage By Percutaneous Catheter  Result Date: 12/14/2016 INDICATION: Postop pelvic fluid collection, status post  perforated appendicitis, worsening white count EXAM:  CT-GUIDED PELVIC FLUID COLLECTION DRAINAGE VIA A LEFT TRANS GLUTEAL APPROACH. MEDICATIONS: The patient is currently admitted to the hospital and receiving intravenous antibiotics. The antibiotics were administered within an appropriate time frame prior to the initiation of the procedure. ANESTHESIA/SEDATION: Fentanyl 200 mcg IV; Versed 3.0 mg IV Moderate Sedation Time:  21 minutes The patient was continuously monitored during the procedure by the interventional radiology nurse under my direct supervision. COMPLICATIONS: None. PROCEDURE: Informed written consent was obtained from the patient after a thorough discussion of the procedural risks, benefits and alternatives. All questions were addressed. Maximal Sterile Barrier Technique was utilized including caps, mask, sterile gowns, sterile gloves, sterile drape, hand hygiene and skin antiseptic. A timeout was performed prior to the initiation of the procedure. Previous imaging reviewed. Patient positioned nearly prone. Noncontrast localization CT performed. The left trans gluteal approach to the cul-de-sac pelvic fluid collection was localized. Overlying skin marked. Under sterile conditions and local anesthesia, a 17 gauge 16.8 cm access needle was advanced from a left trans gluteal posterior oblique approach into the fluid collection. Needle position confirmed with CT. Guidewire inserted. Guidewire position confirmed with CT. Tract dilatation performed to insert a 10 [place] drain. Drain catheter positioned within the fluid collection. Position confirmed with CT. Catheter secured with a Prolene suture and connected to external suction bulb. Sterile dressing applied. No immediate complication. Patient tolerated the procedure well. IMPRESSION: Successful CT-guided pelvic fluid collection drain insertion via left trans gluteal approach. Electronically Signed   By: Jerilynn Mages.  Shick M.D.   On: 12/14/2016 16:36     Anti-infectives: Anti-infectives    Start     Dose/Rate Route Frequency Ordered Stop   12/14/16 0600  vancomycin (VANCOCIN) 1,250 mg in sodium chloride 0.9 % 250 mL IVPB     1,250 mg 166.7 mL/hr over 90 Minutes Intravenous Every 12 hours 12/13/16 1916     12/10/16 1400  vancomycin (VANCOCIN) IVPB 1000 mg/200 mL premix  Status:  Discontinued     1,000 mg 100 mL/hr over 120 Minutes Intravenous Every 12 hours 12/10/16 1059 12/13/16 1916   12/08/16 1800  ertapenem (INVANZ) 1 g in sodium chloride 0.9 % 50 mL IVPB     1 g 100 mL/hr over 30 Minutes Intravenous Every 24 hours 12/08/16 1512     12/08/16 1600  vancomycin (VANCOCIN) IVPB 750 mg/150 ml premix  Status:  Discontinued     750 mg 100 mL/hr over 90 Minutes Intravenous Every 12 hours 12/08/16 1513 12/10/16 1059   12/07/16 2300  vancomycin (VANCOCIN) IVPB 750 mg/150 ml premix  Status:  Discontinued     750 mg 150 mL/hr over 60 Minutes Intravenous Every 12 hours 12/07/16 1103 12/08/16 1513   12/07/16 2200  ciprofloxacin (CIPRO) IVPB 400 mg  Status:  Discontinued     400 mg 200 mL/hr over 60 Minutes Intravenous Every 12 hours 12/07/16 1122 12/08/16 1512   12/07/16 1115  vancomycin (VANCOCIN) 2,000 mg in sodium chloride 0.9 % 500 mL IVPB     2,000 mg 250 mL/hr over 120 Minutes Intravenous  Once 12/07/16 1103 12/07/16 1440   12/06/16 2200  ciprofloxacin (CIPRO) IVPB 400 mg  Status:  Discontinued     400 mg 200 mL/hr over 60 Minutes Intravenous Every 12 hours 12/06/16 1409 12/07/16 1039   12/06/16 1400  piperacillin-tazobactam (ZOSYN) IVPB 3.375 g  Status:  Discontinued     3.375 g 12.5 mL/hr over 240 Minutes Intravenous Every 8 hours 12/06/16 0643 12/06/16 0825   12/06/16 1000  ciprofloxacin (CIPRO) IVPB 200 mg  Status:  Discontinued     200 mg 100 mL/hr over 60 Minutes Intravenous Every 12 hours 12/06/16 0825 12/06/16 1409   12/06/16 1000  metroNIDAZOLE (FLAGYL) IVPB 500 mg  Status:  Discontinued     500 mg 100 mL/hr over 60  Minutes Intravenous Every 8 hours 12/06/16 0825 12/08/16 1512   12/04/16 0630  piperacillin-tazobactam (ZOSYN) 3.375 g in dextrose 5 % 50 mL IVPB  Status:  Discontinued     3.375 g 12.5 mL/hr over 240 Minutes Intravenous Every 8 hours 12/04/16 0623 12/06/16 0643   12/03/16 2130  piperacillin-tazobactam (ZOSYN) IVPB 3.375 g  Status:  Discontinued     3.375 g 12.5 mL/hr over 240 Minutes Intravenous Every 8 hours 12/03/16 2125 12/04/16 0623   12/03/16 1915  piperacillin-tazobactam (ZOSYN) IVPB 3.375 g     3.375 g 100 mL/hr over 30 Minutes Intravenous  Once 12/03/16 1907 12/03/16 2005      Assessment/Plan: s/p Procedure(s): ENDOBRONCHIAL ULTRASOUND (Bilateral)  Perforated appendicitis with drains x 2.  Second placed yesterday and now WBC normal today.  Continue drains and IV antibiotics Resume diet  LOS: 12 days    Chucky Homes A 12/15/2016

## 2016-12-15 NOTE — Progress Notes (Signed)
[facility] NOTE   Pharmacy Consult for TPN Indication: prolonged PO intolerance  Patient Measurements: Height: '5\' 7"'$  (170.2 cm) Weight: 225 lb (102.1 kg) IBW/kg (Calculated) : 61.6 TPN AdjBW (KG): 71.7 Body mass index is 35.24 kg/m. Usual Weight: 102 kg on 3/5  Insulin Requirements: none ordered  Current Nutrition: essentially none, not taking supplements  IVF: D5NS at 100 ml/hr  Central access: PICC TPN start date: 3/16  ASSESSMENT                                                                                                          HPI: 15 yoF presenting with RLQ pain x 3 days. Found to have perforated appendicitis with multiple intra-abdominal abscess and admitted for percutaneous drainage.   Significant events:  3/16 - placed transgluteal/pelvic drain as original 3/6 drain output now appearing feculent and WBC rising.  Today, 12/15/2016:  Glucose - <150  Electrolytes - Mg low (4gm IV magnesium ordered 3/16 but NOT charted as given), others wnl  Renal - wnl  LFTs - low  TGs - 81  Prealbumin - 5.6  NUTRITIONAL GOALS                                                                                             RD recs:  Kcal:  1800-2100kcal/day  Protein:  102-122g/day  Fluid:  >1.8L  Clinimix E 5/15 at a goal rate of 90 ml/hr + 20% fat emulsion at 20 ml/hr (over 12 hr) to provide: 108 g/day protein, 2013 Kcal/day.  PLAN                                                                                                                          Mag sulfate 4gm IV x 1 At 1800 today:  stop TPN and fats per surgery  Stop TPN lab panels  D/c SSI as no hx of diabetes  Bmet, Phos, Mag x 3 days; per dietitian patient is high risk for refeeding  Dolly Rias RPh 12/15/2016, 10:59 AM Pager [phone]

## 2016-12-15 NOTE — Progress Notes (Signed)
Referring Physician(s): Dr. Autumn Messing  Supervising Physician: Jacqulynn Cadet  Patient Status:  [facility] - In-pt  Chief Complaint:  RLQ abscess  Subjective: "Am I ever going to feel better?  I'm so weak."  Allergies: Ciprofloxacin; Flagyl [metronidazole]; and Zosyn [piperacillin sod-tazobactam so]  Medications: Prior to Admission medications   Medication Sig Start Date End Date Taking? Authorizing Provider  ADVAIR DISKUS 250-50 MCG/DOSE AEPB Inhale 1 puff into the lungs Twice daily. 03/10/12  Yes Historical Provider, MD  albuterol (PROVENTIL HFA;VENTOLIN HFA) 108 (90 Base) MCG/ACT inhaler Inhale 2 puffs into the lungs every 6 (six) hours as needed for wheezing or shortness of breath.   Yes Historical Provider, MD  ALPRAZolam Duanne Moron) 0.5 MG tablet Take 0.5 mg by mouth 3 (three) times daily as needed for anxiety.  11/21/16  Yes Historical Provider, MD  amLODipine (NORVASC) 10 MG tablet Take 10 mg by mouth daily. 09/11/16  Yes Historical Provider, MD  Cholecalciferol (VITAMIN D PO) Take 4,000-5,000 Units by mouth daily.   Yes Historical Provider, MD  citalopram (CELEXA) 10 MG tablet Take 10 mg by mouth daily. 11/21/16  Yes Historical Provider, MD  losartan-hydrochlorothiazide (HYZAAR) 100-25 MG per tablet Take 1 tablet by mouth daily. 05/04/14  Yes Belva Crome, MD  metoprolol (LOPRESSOR) 100 MG tablet Take 1.5 tablets (150 mg total) by mouth daily. 07/14/13  Yes Belva Crome, MD  amLODipine (NORVASC) 5 MG tablet Take 1 tablet (5 mg total) by mouth daily. Patient not taking: Reported on 12/03/2016 07/14/13   Belva Crome, MD     Vital Signs: BP (!) 176/72 (BP Location: Left Arm)   Pulse 92   Temp 98.6 F (37 C) (Oral)   Resp 16   Ht '5\' 7"'$  (1.702 m)   Wt 225 lb (102.1 kg)   SpO2 92%   BMI 35.24 kg/m   Physical Exam  NAD, alert, communicative this AM Abd:  RLQ drain in place with continued minimal, thick, brown output. Flushes well.  Site c/d/I.  New left pelvic drain in place.   Site c/d/I.  Serosanguinous output.   Imaging: [place] Chest  Result Date: 12/11/2016 CLINICAL DATA:  Patient with history of right lung endobronchial mass, perforated appendicitis with recent abscess drainage, small pleural effusions. Request made for right thoracentesis . EXAM: CHEST ULTRASOUND COMPARISON:  CT chest done 12/08/2016 FINDINGS: Limited ultrasound right posterior chest region reveals a very small loculated pleural effusion. IMPRESSION: Limited ultrasound right posterior chest today reveals a very small loculated pleural effusion. Options of proceeding with diagnostic right thoracentesis versus obtaining follow-up assessment of fluid volume in a few days for thoracentesis were discussed with patient. She wishes to postpone thoracentesis today and re-evaluate fluid volume in a few days. Procedure not performed. Read by: Rowe Robert, PA-C Electronically Signed   By: Jacqulynn Cadet M.D.   On: 12/11/2016 10:36   Ct Abdomen Pelvis W Contrast  Result Date: 12/13/2016 CLINICAL DATA:  Status post drain placement for ruptured appendix. Right lower quadrant pain. Right lung endobronchial mass. EXAM: CT ABDOMEN AND PELVIS WITH CONTRAST TECHNIQUE: Multidetector CT imaging of the abdomen and pelvis was performed using the standard protocol following bolus administration of intravenous contrast. CONTRAST:  136m ISOVUE-300 IOPAMIDOL (ISOVUE-300) INJECTION 61%, 1102mISOVUE-300 IOPAMIDOL (ISOVUE-300) INJECTION 61% COMPARISON:  12/03/2016. FINDINGS: Lower chest: Right worse than left base airspace disease with heterogeneous right lung attenuation, likely related to the known central obstruction. Small bilateral pleural effusions are similar to 12/08/2016. Moderate cardiomegaly. Hepatobiliary:  Cirrhosis, without focal liver lesion. Normal gallbladder, without biliary ductal dilatation. Pancreas: Normal, without mass or ductal dilatation. Spleen: Normal in size, without focal abnormality. Adrenals/Urinary Tract:  Normal adrenal glands. Normal kidneys, without hydronephrosis. Normal urinary bladder. Stomach/Bowel: Normal stomach, without wall thickening. Normal colon and terminal ileum. The appendix is no longer confidently identified. Normal small bowel caliber. Vascular/Lymphatic: Advanced aortic and branch vessel atherosclerosis. Patent portal and splenic veins. No evidence of portal venous hypertension. No abdominopelvic adenopathy. Reproductive: Normal uterus.  No adnexal mass. Other: Similar small volume perihepatic ascites. Loculated ascites with peritoneal thickening in the anterior left abdomen. Example 14.5 x 1.8 cm on image 47/series 2. This is similar to decreased from 17.1 x 2.7 cm on the prior. Percutaneous drain within the right lower quadrant. No surrounding fluid collection identified. Cul-de-sac fluid with peritoneal thickening. Collection measures 6.8 x 3.6 cm on image 75/series 2. This is similar in size on the prior exam. The peritoneal thickening is new or increased. contiguous trace left pelvic fluid on image 76/series 2. Anasarca. 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 Interval percutaneous drain of the right lower quadrant fluid collection, which has resolved. 2. Loculated ascites within the left side of the abdomen and pelvic cul-de-sac. These are similar to decreased in size since 12/03/2016. Suspicious for infected ascites. 3. Similar appearance of the lung bases, with pleural fluid and bibasilar consolidation. Please see chest CT of 12/08/2016 for description of probable right sided neoplasm. Electronically Signed   By: Abigail Miyamoto M.D.   On: 12/13/2016 12:16   Ct Image Guided Drainage By Percutaneous Catheter  Result Date: 12/14/2016 INDICATION: Postop pelvic fluid collection, status post perforated appendicitis, worsening white count EXAM: CT-GUIDED PELVIC FLUID COLLECTION DRAINAGE VIA A LEFT TRANS GLUTEAL APPROACH. MEDICATIONS: The patient is currently admitted to  the hospital and receiving intravenous antibiotics. The antibiotics were administered within an appropriate time frame prior to the initiation of the procedure. ANESTHESIA/SEDATION: Fentanyl 200 mcg IV; Versed 3.0 mg IV Moderate Sedation Time:  21 minutes The patient was continuously monitored during the procedure by the interventional radiology nurse under my direct supervision. COMPLICATIONS: None. PROCEDURE: Informed written consent was obtained from the patient after a thorough discussion of the procedural risks, benefits and alternatives. All questions were addressed. Maximal Sterile Barrier Technique was utilized including caps, mask, sterile gowns, sterile gloves, sterile drape, hand hygiene and skin antiseptic. A timeout was performed prior to the initiation of the procedure. Previous imaging reviewed. Patient positioned nearly prone. Noncontrast localization CT performed. The left trans gluteal approach to the cul-de-sac pelvic fluid collection was localized. Overlying skin marked. Under sterile conditions and local anesthesia, a 17 gauge 16.8 cm access needle was advanced from a left trans gluteal posterior oblique approach into the fluid collection. Needle position confirmed with CT. Guidewire inserted. Guidewire position confirmed with CT. Tract dilatation performed to insert a 10 [place] drain. Drain catheter positioned within the fluid collection. Position confirmed with CT. Catheter secured with a Prolene suture and connected to external suction bulb. Sterile dressing applied. No immediate complication. Patient tolerated the procedure well. IMPRESSION: Successful CT-guided pelvic fluid collection drain insertion via left trans gluteal approach. Electronically Signed   By: Jerilynn Mages.  Shick M.D.   On: 12/14/2016 16:36    Labs:  CBC:  Recent Labs  12/12/16 0440 12/13/16 0314 12/14/16 0412 12/15/16 0452  WBC 12.8* 12.1* 16.1* 10.4  HGB 11.7* 11.1* 11.5* 10.6*  HCT 35.9* 34.1* 34.4* 32.0*  PLT 405*  421* 379  352    COAGS:  Recent Labs  12/04/16 1005  INR 1.15    BMP:  Recent Labs  12/12/16 0644 12/13/16 0314 12/14/16 0412 12/15/16 0452  NA 132* 141 138 137  K 4.1 3.6 3.6 3.6  CL 99* 105 102 101  CO2 29 32 32 30  GLUCOSE 91 105* 114* 99  BUN <5* '6 6 8  '$ CALCIUM 7.6* 8.5* 8.3* 8.1*  CREATININE 0.62 0.58 0.57 0.65  GFRNONAA >60 >60 >60 >60  GFRAA >60 >60 >60 >60    LIVER FUNCTION TESTS:  Recent Labs  12/03/16 1613 12/15/16 0452  BILITOT 0.7 0.8  AST 19 10*  ALT 13* 9*  ALKPHOS 98 57  PROT 6.7 4.8*  ALBUMIN 2.8* 1.9*    Assessment and Plan: Perforated appendicitis with multiple intra-abdominal abscess s/p RLQ drain placed 12/04/16, and now with left transgluteal pelvic drain placed 12/14/16.  RLQ and pelvic drains in place.  WBC improved today  10.4 Continue current management per surgery.  Continue routine drain care.  IR to follow.   Electronically Signed: Docia Barrier 12/15/2016, 9:06 AM   I spent a total of 15 Minutes at the the patient's bedside AND on the patient's hospital floor or unit, greater than 50% of which was counseling/coordinating care for intra-abdominal abscesses.

## 2016-12-16 ENCOUNTER — Inpatient Hospital Stay (HOSPITAL_COMMUNITY): Payer: Medicare Other

## 2016-12-16 DIAGNOSIS — M79609 Pain in unspecified limb: Secondary | ICD-10-CM

## 2016-12-16 DIAGNOSIS — E876 Hypokalemia: Secondary | ICD-10-CM

## 2016-12-16 DIAGNOSIS — M7989 Other specified soft tissue disorders: Secondary | ICD-10-CM

## 2016-12-16 LAB — BASIC METABOLIC PANEL
Anion gap: 5 (ref 5–15)
BUN: 9 mg/dL (ref 6–20)
CO2: 33 mmol/L — ABNORMAL HIGH (ref 22–32)
CREATININE: 0.56 mg/dL (ref 0.44–1.00)
Calcium: 8 mg/dL — ABNORMAL LOW (ref 8.9–10.3)
Chloride: 101 mmol/L (ref 101–111)
GFR calc Af Amer: >60 mL/min (ref >60)
Glucose, Bld: 100 mg/dL — ABNORMAL HIGH (ref 65–99)
POTASSIUM: 3.2 mmol/L — AB (ref 3.5–5.1)
SODIUM: 139 mmol/L (ref 135–145)

## 2016-12-16 LAB — PHOSPHORUS: PHOSPHORUS: 3.5 mg/dL (ref 2.5–4.6)

## 2016-12-16 LAB — MAGNESIUM: MAGNESIUM: 1.7 mg/dL (ref 1.7–2.4)

## 2016-12-16 MED ORDER — PROCHLORPERAZINE EDISYLATE 5 MG/ML IJ SOLN: 5.0000 mg | INTRAMUSCULAR | Status: DC | PRN

## 2016-12-16 MED ORDER — LACTATED RINGERS IV BOLUS (SEPSIS): 1000.0000 mL | Freq: Three times a day (TID) | INTRAVENOUS | Status: DC | PRN

## 2016-12-16 MED ORDER — LIP MEDEX EX OINT
1.0000 "application " | TOPICAL_OINTMENT | Freq: Two times a day (BID) | CUTANEOUS | Status: DC
  Administered 2016-12-16 – 2016-12-26 (×16): 1 via TOPICAL
  Filled 2016-12-16 (×3): qty 7

## 2016-12-16 MED ORDER — CITALOPRAM HYDROBROMIDE 20 MG PO TABS
10.0000 mg | ORAL_TABLET | Freq: Every day | ORAL | Status: DC
  Administered 2016-12-16 – 2016-12-26 (×10): 10 mg via ORAL
  Filled 2016-12-16 (×10): qty 1

## 2016-12-16 MED ORDER — SODIUM CHLORIDE 0.9% FLUSH
3.0000 mL | Freq: Two times a day (BID) | INTRAVENOUS | Status: DC
  Administered 2016-12-16 – 2016-12-23 (×11): 3 mL via INTRAVENOUS
  Administered 2016-12-23: 10 mL via INTRAVENOUS
  Administered 2016-12-24 – 2016-12-25 (×3): 3 mL via INTRAVENOUS

## 2016-12-16 MED ORDER — SACCHAROMYCES BOULARDII 250 MG PO CAPS
250.0000 mg | ORAL_CAPSULE | Freq: Two times a day (BID) | ORAL | Status: DC
  Administered 2016-12-16 – 2016-12-26 (×18): 250 mg via ORAL
  Filled 2016-12-16 (×18): qty 1

## 2016-12-16 MED ORDER — TRAMADOL HCL 50 MG PO TABS: 50.0000 mg | ORAL_TABLET | Freq: Four times a day (QID) | ORAL | Status: DC | PRN

## 2016-12-16 MED ORDER — MAGIC MOUTHWASH
15.0000 mL | Freq: Four times a day (QID) | ORAL | Status: DC | PRN
  Filled 2016-12-16: qty 15

## 2016-12-16 MED ORDER — LACTATED RINGERS IV BOLUS (SEPSIS): 1000.0000 mL | Freq: Three times a day (TID) | INTRAVENOUS | Status: AC | PRN

## 2016-12-16 MED ORDER — MAGNESIUM SULFATE 4 GM/100ML IV SOLN
4.0000 g | Freq: Once | INTRAVENOUS | Status: AC
  Administered 2016-12-16: 4 g via INTRAVENOUS
  Filled 2016-12-16: qty 100

## 2016-12-16 MED ORDER — PHENOL 1.4 % MT LIQD: 2.0000 | OROMUCOSAL | Status: DC | PRN

## 2016-12-16 MED ORDER — PANTOPRAZOLE SODIUM 40 MG PO TBEC
40.0000 mg | DELAYED_RELEASE_TABLET | Freq: Every day | ORAL | Status: DC
  Administered 2016-12-16 – 2016-12-26 (×11): 40 mg via ORAL
  Filled 2016-12-16 (×11): qty 1

## 2016-12-16 MED ORDER — AMLODIPINE BESYLATE 10 MG PO TABS
10.0000 mg | ORAL_TABLET | Freq: Every day | ORAL | Status: DC
  Administered 2016-12-16 – 2016-12-25 (×10): 10 mg via ORAL
  Filled 2016-12-16 (×10): qty 1

## 2016-12-16 MED ORDER — HYDRALAZINE HCL 20 MG/ML IJ SOLN
5.0000 mg | Freq: Four times a day (QID) | INTRAMUSCULAR | Status: DC | PRN
  Administered 2016-12-17: 11:00:00 5 mg via INTRAVENOUS
  Filled 2016-12-16 (×2): qty 1

## 2016-12-16 MED ORDER — MENTHOL 3 MG MT LOZG: 1.0000 | LOZENGE | OROMUCOSAL | Status: DC | PRN

## 2016-12-16 MED ORDER — POTASSIUM CHLORIDE CRYS ER 20 MEQ PO TBCR
40.0000 meq | EXTENDED_RELEASE_TABLET | Freq: Two times a day (BID) | ORAL | Status: AC
  Administered 2016-12-16 – 2016-12-18 (×4): 40 meq via ORAL
  Filled 2016-12-16 (×4): qty 2

## 2016-12-16 MED ORDER — VITAMIN D3 25 MCG (1000 UNIT) PO TABS
1000.0000 [IU] | ORAL_TABLET | Freq: Every day | ORAL | Status: DC
  Administered 2016-12-16 – 2016-12-26 (×9): 1000 [IU] via ORAL
  Filled 2016-12-16 (×9): qty 1

## 2016-12-16 MED ORDER — SODIUM CHLORIDE 0.9 % IV SOLN: 250.0000 mL | INTRAVENOUS | Status: DC | PRN

## 2016-12-16 MED ORDER — SODIUM CHLORIDE 0.9% FLUSH
3.0000 mL | INTRAVENOUS | Status: DC | PRN
  Administered 2016-12-18 – 2016-12-25 (×2): 3 mL via INTRAVENOUS
  Filled 2016-12-16 (×2): qty 3

## 2016-12-16 MED ORDER — AMLODIPINE BESYLATE 5 MG PO TABS: 5.0000 mg | ORAL_TABLET | Freq: Every day | ORAL | Status: DC

## 2016-12-16 MED ORDER — FLUCONAZOLE 100 MG PO TABS
200.0000 mg | ORAL_TABLET | Freq: Every day | ORAL | Status: DC
  Administered 2016-12-16 – 2016-12-20 (×4): 200 mg via ORAL
  Filled 2016-12-16: qty 1
  Filled 2016-12-16 (×2): qty 2
  Filled 2016-12-16: qty 1
  Filled 2016-12-16: qty 2

## 2016-12-16 MED ORDER — DEXTROSE 5 % IV SOLN
1000.0000 mg | Freq: Four times a day (QID) | INTRAVENOUS | Status: DC | PRN
  Filled 2016-12-16: qty 10

## 2016-12-16 MED ORDER — METOPROLOL TARTRATE 5 MG/5ML IV SOLN: 5.0000 mg | Freq: Four times a day (QID) | INTRAVENOUS | Status: DC | PRN

## 2016-12-16 MED ORDER — ALPRAZOLAM 0.5 MG PO TABS
0.5000 mg | ORAL_TABLET | Freq: Three times a day (TID) | ORAL | Status: DC | PRN
  Administered 2016-12-16 – 2016-12-18 (×5): 0.5 mg via ORAL
  Filled 2016-12-16 (×5): qty 1

## 2016-12-16 NOTE — Progress Notes (Addendum)
[facility]  [address] [place], [place] [address] Phone: [phone] FAX: [phone]   Embry Manrique [id number] 09-03-1946    Problem List:   Principal Problem:   Acute appendicitis with perforation and peritoneal abscess Active Problems:   Obesity   Smoker   COPD (chronic obstructive pulmonary disease) ([place])   Intraperitoneal abscess s/p perc drainage 12/14/2016      12/17/2016  Procedure(s): ENDOBRONCHIAL ULTRASOUND   Assessment  Ruptured appendicitis with abscesses slowly improving  Plan:  -continue Vanco/Ertapenem for enterococcus faecalis appendiceal abscess.  Add fluconazole x 5 d with Candida in last collection.  f/u new abscess collection  -adv diet  -bowel regimen  RIGHT lower lobe mass/ Right-sided endobronchial obstruction- per CCM, If pleural fluid is unrevealing or cannot be obtained the next logical step would be a bronchoscopy with airway inspection and endobronchial ultrasound-guided fine-needle aspiration for appropriate mediastinal lymph node staging.    Brochoscopy with EBUS Monday 3/19 at 7:30 AM by Dr. Beatriz Stallion pleural effusions- thoracentesis not performed 3/13 due to limited amount of fluid  COPD - on O2 at home. on Dulera 2 puffs twice a day. Patient will need repeat pulmonary function testing as an outpatient as well as screening for alpha-1 antitrypsin deficiency  Recent pneumonia- earlier this year  lowK - replace  Low Mag - replace  -VTE prophylaxis- SCDs, etc -mobilize as tolerated to help recovery  Adin Hector, M.D., F.A.C.S. Gastrointestinal and Minimally Invasive Surgery [facility] [address] [place], [place] [address] [phone] Main / Paging   12/16/2016  CARE TEAM:  PCP: Pcp Not In System  Outpatient Care Team: Patient Care Team: Pcp Not In System as PCP - General  Inpatient Treatment Team: Treatment Team:  Attending Provider: Nolon Nations, MD; Attending Physician: Nolon Nations, MD; Registered Nurse: Yvette Rack, RN; Technician: April R Juarez, NT; Consulting Physician: Md Pccm, MD; Rounding Team: [facility] [place], MD; Registered Nurse: Nolene Ebbs, RN  Subjective:  Sleeping in NAD Abd pain gone No n/v   Objective:  Vital signs:  Vitals:   12/15/16 1433 12/15/16 2036 12/15/16 2200 12/16/16 0500  BP:   (!) 170/68 (!) 159/62  Pulse:   75   Resp:   15 15  Temp:   98.5 F (36.9 C) 98.4 F (36.9 C)  TempSrc:   Oral Oral  SpO2: 93% 91% 92% 95%  Weight:      Height:        Last BM Date: 12/14/16  Intake/Output   Yesterday:  03/17 0701 - 03/18 0700 In: 2218.7 [P.O.:1320; I.V.:898.7] Out: 122.5 [Urine:100; Drains:22.5] This shift:  No intake/output data recorded.  Bowel function:  Flatus: YES  BM:  No  Drain: Feculent   Physical Exam:  General: Pt awakens eventually alert/oriented x4 in No acute distress Eyes: PERRL, normal EOM.  Sclera clear.  No icterus Neuro: CN II-XII intact w/o focal sensory/motor deficits. Lymph: No head/neck/groin lymphadenopathy Psych:  No delerium/psychosis/paranoia HENT: Normocephalic, Mucus membranes moist.  No thrush Neck: Supple, No tracheal deviation Chest: No chest wall pain w good excursion.  Dec BS at bases CV:  Pulses intact.  Regular rhythm MS: Normal AROM mjr joints.  No obvious deformity Abdomen: Soft.  Mildy distended.  Tenderness at drains only - mild.  No evidence of peritonitis.  No incarcerated hernias. Ext:  SCDs BLE.  No mjr edema.  No cyanosis Skin: No petechiae / purpura  Results:  Labs: Results for orders placed or performed during the hospital encounter of 12/03/16 (from the past 48 hour(s))  CBC     Status: Abnormal   Collection Time: 12/15/16  4:52 AM  Result Value Ref Range   WBC 10.4 4.0 - 10.5 K/uL   RBC 3.43 (L) 3.87 - 5.11 MIL/uL   Hemoglobin 10.6 (L) 12.0 - 15.0 g/dL   HCT 32.0 (L)  36.0 - 46.0 %   MCV 93.3 78.0 - 100.0 fL   MCH 30.9 26.0 - 34.0 pg   MCHC 33.1 30.0 - 36.0 g/dL   RDW 14.7 11.5 - 15.5 %   Platelets 352 150 - 400 K/uL  Magnesium     Status: Abnormal   Collection Time: 12/15/16  4:52 AM  Result Value Ref Range   Magnesium 1.4 (L) 1.7 - 2.4 mg/dL  Comprehensive metabolic panel     Status: Abnormal   Collection Time: 12/15/16  4:52 AM  Result Value Ref Range   Sodium 137 135 - 145 mmol/L   Potassium 3.6 3.5 - 5.1 mmol/L   Chloride 101 101 - 111 mmol/L   CO2 30 22 - 32 mmol/L   Glucose, Bld 99 65 - 99 mg/dL   BUN 8 6 - 20 mg/dL   Creatinine, Ser 0.65 0.44 - 1.00 mg/dL   Calcium 8.1 (L) 8.9 - 10.3 mg/dL   Total Protein 4.8 (L) 6.5 - 8.1 g/dL   Albumin 1.9 (L) 3.5 - 5.0 g/dL   AST 10 (L) 15 - 41 U/L   ALT 9 (L) 14 - 54 U/L   Alkaline Phosphatase 57 38 - 126 U/L   Total Bilirubin 0.8 0.3 - 1.2 mg/dL   GFR calc non Af Amer >60 >60 mL/min   GFR calc Af Amer >60 >60 mL/min    Comment: (NOTE) The eGFR has been calculated using the CKD EPI equation. This calculation has not been validated in all clinical situations. eGFR's persistently <60 mL/min signify possible Chronic Kidney Disease.    Anion gap 6 5 - 15  Prealbumin     Status: Abnormal   Collection Time: 12/15/16  4:52 AM  Result Value Ref Range   Prealbumin 5.6 (L) 18 - 38 mg/dL    Comment: Performed at [facility] [address], [place], [place] [address]  Phosphorus     Status: None   Collection Time: 12/15/16  4:52 AM  Result Value Ref Range   Phosphorus 3.3 2.5 - 4.6 mg/dL  Triglycerides     Status: None   Collection Time: 12/15/16  4:52 AM  Result Value Ref Range   Triglycerides 81 <150 mg/dL    Comment: Performed at [facility] [address] [place], [place] [address]  Differential     Status: Abnormal   Collection Time: 12/15/16  4:52 AM  Result Value Ref Range   Neutrophils Relative % 83 %   Neutro Abs 8.6 (H) 1.7 - 7.7 K/uL   Lymphocytes Relative 5 %    Lymphs Abs 0.6 (L) 0.7 - 4.0 K/uL   Monocytes Relative 4 %   Monocytes Absolute 0.4 0.1 - 1.0 K/uL   Eosinophils Relative 8 %   Eosinophils Absolute 0.9 (H) 0.0 - 0.7 K/uL   Basophils Relative 0 %   Basophils Absolute 0.0 0.0 - 0.1 K/uL  Basic metabolic panel     Status: Abnormal   Collection Time: 12/16/16  4:57 AM  Result Value Ref Range   Sodium 139  135 - 145 mmol/L   Potassium 3.2 (L) 3.5 - 5.1 mmol/L   Chloride 101 101 - 111 mmol/L   CO2 33 (H) 22 - 32 mmol/L   Glucose, Bld 100 (H) 65 - 99 mg/dL   BUN 9 6 - 20 mg/dL   Creatinine, Ser 0.56 0.44 - 1.00 mg/dL   Calcium 8.0 (L) 8.9 - 10.3 mg/dL   GFR calc non Af Amer >60 >60 mL/min   GFR calc Af Amer >60 >60 mL/min    Comment: (NOTE) The eGFR has been calculated using the CKD EPI equation. This calculation has not been validated in all clinical situations. eGFR's persistently <60 mL/min signify possible Chronic Kidney Disease.    Anion gap 5 5 - 15  Magnesium     Status: None   Collection Time: 12/16/16  4:57 AM  Result Value Ref Range   Magnesium 1.7 1.7 - 2.4 mg/dL  Phosphorus     Status: None   Collection Time: 12/16/16  4:57 AM  Result Value Ref Range   Phosphorus 3.5 2.5 - 4.6 mg/dL    Imaging / Studies: Ct Image Guided Drainage By Percutaneous Catheter  Result Date: 12/14/2016 INDICATION: Postop pelvic fluid collection, status post perforated appendicitis, worsening white count EXAM: CT-GUIDED PELVIC FLUID COLLECTION DRAINAGE VIA A LEFT TRANS GLUTEAL APPROACH. MEDICATIONS: The patient is currently admitted to the hospital and receiving intravenous antibiotics. The antibiotics were administered within an appropriate time frame prior to the initiation of the procedure. ANESTHESIA/SEDATION: Fentanyl 200 mcg IV; Versed 3.0 mg IV Moderate Sedation Time:  21 minutes The patient was continuously monitored during the procedure by the interventional radiology nurse under my direct supervision. COMPLICATIONS: None. PROCEDURE:  Informed written consent was obtained from the patient after a thorough discussion of the procedural risks, benefits and alternatives. All questions were addressed. Maximal Sterile Barrier Technique was utilized including caps, mask, sterile gowns, sterile gloves, sterile drape, hand hygiene and skin antiseptic. A timeout was performed prior to the initiation of the procedure. Previous imaging reviewed. Patient positioned nearly prone. Noncontrast localization CT performed. The left trans gluteal approach to the cul-de-sac pelvic fluid collection was localized. Overlying skin marked. Under sterile conditions and local anesthesia, a 17 gauge 16.8 cm access needle was advanced from a left trans gluteal posterior oblique approach into the fluid collection. Needle position confirmed with CT. Guidewire inserted. Guidewire position confirmed with CT. Tract dilatation performed to insert a 10 [place] drain. Drain catheter positioned within the fluid collection. Position confirmed with CT. Catheter secured with a Prolene suture and connected to external suction bulb. Sterile dressing applied. No immediate complication. Patient tolerated the procedure well. IMPRESSION: Successful CT-guided pelvic fluid collection drain insertion via left trans gluteal approach. Electronically Signed   By: Jerilynn Mages.  Shick M.D.   On: 12/14/2016 16:36    Medications / Allergies: per chart  Antibiotics: Anti-infectives    Start     Dose/Rate Route Frequency Ordered Stop   12/14/16 0600  vancomycin (VANCOCIN) 1,250 mg in sodium chloride 0.9 % 250 mL IVPB     1,250 mg 166.7 mL/hr over 90 Minutes Intravenous Every 12 hours 12/13/16 1916     12/10/16 1400  vancomycin (VANCOCIN) IVPB 1000 mg/200 mL premix  Status:  Discontinued     1,000 mg 100 mL/hr over 120 Minutes Intravenous Every 12 hours 12/10/16 1059 12/13/16 1916   12/08/16 1800  ertapenem (INVANZ) 1 g in sodium chloride 0.9 % 50 mL IVPB     1 g  100 mL/hr over 30 Minutes Intravenous  Every 24 hours 12/08/16 1512     12/08/16 1600  vancomycin (VANCOCIN) IVPB 750 mg/150 ml premix  Status:  Discontinued     750 mg 100 mL/hr over 90 Minutes Intravenous Every 12 hours 12/08/16 1513 12/10/16 1059   12/07/16 2300  vancomycin (VANCOCIN) IVPB 750 mg/150 ml premix  Status:  Discontinued     750 mg 150 mL/hr over 60 Minutes Intravenous Every 12 hours 12/07/16 1103 12/08/16 1513   12/07/16 2200  ciprofloxacin (CIPRO) IVPB 400 mg  Status:  Discontinued     400 mg 200 mL/hr over 60 Minutes Intravenous Every 12 hours 12/07/16 1122 12/08/16 1512   12/07/16 1115  vancomycin (VANCOCIN) 2,000 mg in sodium chloride 0.9 % 500 mL IVPB     2,000 mg 250 mL/hr over 120 Minutes Intravenous  Once 12/07/16 1103 12/07/16 1440   12/06/16 2200  ciprofloxacin (CIPRO) IVPB 400 mg  Status:  Discontinued     400 mg 200 mL/hr over 60 Minutes Intravenous Every 12 hours 12/06/16 1409 12/07/16 1039   12/06/16 1400  piperacillin-tazobactam (ZOSYN) IVPB 3.375 g  Status:  Discontinued     3.375 g 12.5 mL/hr over 240 Minutes Intravenous Every 8 hours 12/06/16 0643 12/06/16 0825   12/06/16 1000  ciprofloxacin (CIPRO) IVPB 200 mg  Status:  Discontinued     200 mg 100 mL/hr over 60 Minutes Intravenous Every 12 hours 12/06/16 0825 12/06/16 1409   12/06/16 1000  metroNIDAZOLE (FLAGYL) IVPB 500 mg  Status:  Discontinued     500 mg 100 mL/hr over 60 Minutes Intravenous Every 8 hours 12/06/16 0825 12/08/16 1512   12/04/16 0630  piperacillin-tazobactam (ZOSYN) 3.375 g in dextrose 5 % 50 mL IVPB  Status:  Discontinued     3.375 g 12.5 mL/hr over 240 Minutes Intravenous Every 8 hours 12/04/16 0623 12/06/16 0643   12/03/16 2130  piperacillin-tazobactam (ZOSYN) IVPB 3.375 g  Status:  Discontinued     3.375 g 12.5 mL/hr over 240 Minutes Intravenous Every 8 hours 12/03/16 2125 12/04/16 0623   12/03/16 1915  piperacillin-tazobactam (ZOSYN) IVPB 3.375 g     3.375 g 100 mL/hr over 30 Minutes Intravenous  Once 12/03/16 1907  12/03/16 2005        Note: Portions of this report may have been transcribed using voice recognition software. Every effort was made to ensure accuracy; however, inadvertent computerized transcription errors may be present.   Any transcriptional errors that result from this process are unintentional.     Adin Hector, M.D., F.A.C.S. Gastrointestinal and Minimally Invasive Surgery [facility] [address], [place], [place] [address] [phone] Main / Paging   12/16/2016

## 2016-12-16 NOTE — Progress Notes (Signed)
Spoke with Alisha Fisher on phone.  States the Right arm is swollen and warm.  Recommend to notify doctor to rule out blood clot.

## 2016-12-16 NOTE — Progress Notes (Signed)
VASCULAR LAB PRELIMINARY  PRELIMINARY  PRELIMINARY  PRELIMINARY  Right upper extremity venous duplex completed.    Preliminary report:  There is no DVT or SVT noted in the right upper extremity.  Significant interstitial fluid noted throughout.  Eliza Green, RVT 12/16/2016, 6:28 PM

## 2016-12-16 NOTE — Clinical Social Work Note (Signed)
Clinical Social Work Assessment  Patient Details  Name: Alisha Fisher MRN: [id number] Date of Birth: [birth year]  Date of referral:  12/16/16               Reason for consult:  Facility Placement                Permission sought to share information with:  Facility Art therapist granted to share information::  Yes, Verbal Permission Granted  Name::        Agency::  SNF  Relationship::     Contact Information:     Housing/Transportation Living arrangements for the past 2 months:  Single Family Home Source of Information:  Patient Patient Interpreter Needed:  None Criminal Activity/Legal Involvement Pertinent to Current Situation/Hospitalization:  No - Comment as needed Significant Relationships:  Adult Children, Siblings Lives with:  Adult Children (states her son lives on the same property) Do you feel safe going back to the place where you live?  No Need for family participation in patient care:  No (Coment)  Care giving concerns:  Pt lives at home with son (pt states he lives in a room on the property? Pt had some trouble expressing what she meant) states her son wouldn't be too much help at home.   Social Worker assessment / plan:  CSW spoke with pt concerning PT recommendation for SNF at time of DC.  CSW explained SNF and SNF referral process.  Pt states she has never been to SNF before but expresses understanding of the recommendation.  Pt alert and oriented but having trouble expressing what she means.  Employment status:  Retired Forensic scientist:  Medicare PT Recommendations:  [place] / Referral to community resources:  [place]  Patient/Family's Response to care:  Pt is agreeable to SNF if needed.  Patient/Family's Understanding of and Emotional Response to Diagnosis, Current Treatment, and Prognosis:  Pt seems somewhat discouraged by her situation and inquiring about when she can leave the hospital-  hopeful that she will improve enough to not need rehab stay.  Emotional Assessment Appearance:  Appears stated age Attitude/Demeanor/Rapport:    Affect (typically observed):  Appropriate Orientation:  Oriented to Self, Oriented to Place, Oriented to  Time, Oriented to Situation Alcohol / Substance use:  Not Applicable Psych involvement (Current and /or in the community):  No (Comment)  Discharge Needs  Concerns to be addressed:  Care Coordination Readmission within the last 30 days:  No Current discharge risk:  Physical Impairment Barriers to Discharge:  Continued Medical Work up   Jorge Ny, LCSW 12/16/2016, 10:05 AM

## 2016-12-16 NOTE — NC FL2 (Signed)
[place] LEVEL OF CARE SCREENING TOOL     IDENTIFICATION  Patient Name: Alisha Fisher Birthdate: 03-27-1946 Sex: female Admission Date (Current Location): 12/03/2016  [facility] and [place] Number:  Herbalist and Address:  [facility],  [place] [address], [place]      Provider Number: [id number]  Attending Physician Name and Address:  Md Edison Pace, MD  Relative Name and Phone Number:       Current Level of Care: Hospital Recommended Level of Care: [place] Prior Approval Number:    Date Approved/Denied:   PASRR Number: [id number]  Discharge Plan: SNF    Current Diagnoses: Patient Active Problem List   Diagnosis Date Noted  . Hypokalemia 12/16/2016  . Hypomagnesemia 12/16/2016  . Intraperitoneal abscess s/p perc drainage 12/14/2016 12/15/2016  . Acute appendicitis with perforation and peritoneal abscess 12/03/2016  . Obesity 05/25/2012  . Smoker 05/25/2012  . COPD (chronic obstructive pulmonary disease) ([place]) 05/25/2012  . Dyspnea 04/11/2012    Orientation RESPIRATION BLADDER Height & Weight     Self, Time, Situation, Place  O2 (3L [place]) Incontinent Weight: 225 lb (102.1 kg) Height:  '5\' 7"'$  (170.2 cm)  BEHAVIORAL SYMPTOMS/MOOD NEUROLOGICAL BOWEL NUTRITION STATUS      Continent Diet (see DC summary)  AMBULATORY STATUS COMMUNICATION OF NEEDS Skin   Extensive Assist Verbally Surgical wounds                       Personal Care Assistance Level of Assistance  Bathing, Dressing Bathing Assistance: Maximum assistance   Dressing Assistance: Maximum assistance     Functional Limitations Info             SPECIAL CARE FACTORS FREQUENCY  PT (By licensed PT), OT (By licensed OT)     PT Frequency: 5/wk OT Frequency: 5/wk            Contractures      Additional Factors Info  Code Status, Allergies Code Status Info: FULL Allergies Info: Ciprofloxacin, Flagyl Metronidazole, Zosyn Piperacillin  Sod-tazobactam So           Current Medications (12/16/2016):  This is the current hospital active medication list Current Facility-Administered Medications  Medication Dose Route Frequency Provider Last Rate Last Dose  . acetaminophen (TYLENOL) tablet 650 mg  650 mg Oral Q6H PRN Jerrye Beavers, PA-C      . albuterol (PROVENTIL) (2.5 MG/3ML) 0.083% nebulizer solution 2.5 mg  2.5 mg Nebulization Q6H PRN Johnathan Hausen, MD   2.5 mg at 12/16/16 0436  . ALPRAZolam Duanne Moron) tablet 0.5 mg  0.5 mg Oral TID PRN Michael Boston, MD      . amLODipine (NORVASC) tablet 10 mg  10 mg Oral Daily Michael Boston, MD      . cholecalciferol (VITAMIN D) tablet 1,000 Units  1,000 Units Oral Daily Michael Boston, MD      . citalopram (CELEXA) tablet 10 mg  10 mg Oral Daily Michael Boston, MD      . dextrose 5 %-0.9 % sodium chloride infusion   Intravenous Continuous Polly Cobia, RPH 60 mL/hr at 12/15/16 1857    . diphenhydrAMINE (BENADRYL) capsule 25 mg  25 mg Oral Q6H PRN Johnathan Hausen, MD   25 mg at 12/14/16 2210  . diphenhydrAMINE (BENADRYL) injection 12.5 mg  12.5 mg Intravenous Q6H PRN Jerrye Beavers, PA-C   12.5 mg at 12/13/16 1821  . diphenhydrAMINE-zinc acetate (BENADRYL) 2-0.1 % cream   Topical  TID PRN Jerrye Beavers, PA-C      . ertapenem [facility]) 1 g in sodium chloride 0.9 % 50 mL IVPB  1 g Intravenous Q24H Autumn Messing III, MD   1 g at 12/15/16 1726  . fluconazole (DIFLUCAN) tablet 200 mg  200 mg Oral Daily Michael Boston, MD      . heparin injection 5,000 Units  5,000 Units Subcutaneous Q8H Saverio Danker, PA-C   5,000 Units at 12/16/16 [phone]  . hydrALAZINE (APRESOLINE) injection 5-20 mg  5-20 mg Intravenous Q6H PRN Michael Boston, MD      . HYDROmorphone (DILAUDID) injection 0.5-1 mg  0.5-1 mg Intravenous Q3H PRN Jerrye Beavers, PA-C   1 mg at 12/14/16 0919  . iopamidol (ISOVUE-300) 61 % injection 15 mL  15 mL Oral BID PRN Jerrye Beavers, PA-C   15 mL at 12/13/16 0653  . iopamidol (ISOVUE-300) 61 % injection  15 mL  15 mL Oral Once PRN Jerrye Beavers, PA-C      . ipratropium-albuterol (DUONEB) 0.5-2.5 (3) MG/3ML nebulizer solution 3 mL  3 mL Nebulization TID Johnathan Hausen, MD   3 mL at 12/16/16 0842  . lactated ringers bolus 1,000 mL  1,000 mL Intravenous Q8H PRN Michael Boston, MD      . lip balm (CARMEX) ointment 1 application  1 application Topical BID Michael Boston, MD      . LORazepam (ATIVAN) injection 0.5-1 mg  0.5-1 mg Intravenous Q6H PRN Jerrye Beavers, PA-C   1 mg at 12/15/16 1735  . magic mouthwash  15 mL Oral QID PRN Michael Boston, MD      . magnesium sulfate IVPB 4 g 100 mL  4 g Intravenous Once Michael Boston, MD      . menthol-cetylpyridinium (CEPACOL) lozenge 3 mg  1 lozenge Oral PRN Michael Boston, MD      . methocarbamol (ROBAXIN) 1,000 mg in dextrose 5 % 50 mL IVPB  1,000 mg Intravenous Q6H PRN Michael Boston, MD      . metoprolol (LOPRESSOR) injection 5 mg  5 mg Intravenous Q6H PRN Michael Boston, MD      . metoprolol tartrate (LOPRESSOR) tablet 150 mg  150 mg Oral Daily Johnathan Hausen, MD   150 mg at 12/15/16 1039  . mometasone-formoterol (DULERA) 200-5 MCG/ACT inhaler 2 puff  2 puff Inhalation BID Jerrye Beavers, PA-C   2 puff at 12/16/16 [phone]  . ondansetron (ZOFRAN-ODT) disintegrating tablet 4 mg  4 mg Oral Q6H PRN Johnathan Hausen, MD       Or  . ondansetron [facility]) injection 4 mg  4 mg Intravenous Q6H PRN Johnathan Hausen, MD   4 mg at 12/11/16 0847  . pantoprazole (PROTONIX) injection 40 mg  40 mg Intravenous QHS Johnathan Hausen, MD   40 mg at 12/15/16 2052  . phenol (CHLORASEPTIC) mouth spray 2 spray  2 spray Mouth/Throat PRN Michael Boston, MD      . polyethylene glycol (MIRALAX / GLYCOLAX) packet 17 g  17 g Oral Daily Jerrye Beavers, PA-C   17 g at 12/15/16 1038  . potassium chloride SA (K-DUR,KLOR-CON) CR tablet 40 mEq  40 mEq Oral BID Michael Boston, MD      . prochlorperazine (COMPAZINE) injection 5-10 mg  5-10 mg Intravenous Q4H PRN Michael Boston, MD      . saccharomyces boulardii  (FLORASTOR) capsule 250 mg  250 mg Oral BID Michael Boston, MD      . sodium chloride flush (NS) 0.9 %  injection 10-40 mL  10-40 mL Intracatheter PRN Jerrye Beavers, PA-C   10 mL at 12/14/16 0414  . sodium chloride flush (NS) 0.9 % injection 10-40 mL  10-40 mL Intracatheter PRN Saverio Danker, PA-C      . traMADol Veatrice Bourbon) tablet 50-100 mg  50-100 mg Oral Q6H PRN Michael Boston, MD      . vancomycin (VANCOCIN) 1,250 mg in sodium chloride 0.9 % 250 mL IVPB  1,250 mg Intravenous Q12H Adrian Saran, RPH   1,250 mg at 12/16/16 0603     Discharge Medications: Please see discharge summary for a list of discharge medications.  Relevant Imaging Results:  Relevant Lab Results:   Additional Information SS#: [id number]  Jorge Ny, LCSW

## 2016-12-17 ENCOUNTER — Inpatient Hospital Stay (HOSPITAL_COMMUNITY): Payer: Medicare Other | Admitting: Anesthesiology

## 2016-12-17 ENCOUNTER — Inpatient Hospital Stay (HOSPITAL_COMMUNITY): Payer: Medicare Other

## 2016-12-17 ENCOUNTER — Encounter (HOSPITAL_COMMUNITY): Admission: EM | Disposition: A | Discharge status: Skilled Nursing Facility | Payer: Self-pay | Source: Home / Self Care

## 2016-12-17 ENCOUNTER — Encounter: Payer: Self-pay | Admitting: Pulmonary Disease

## 2016-12-17 ENCOUNTER — Encounter (HOSPITAL_COMMUNITY): Payer: Self-pay | Admitting: Anesthesiology

## 2016-12-17 ENCOUNTER — Inpatient Hospital Stay (HOSPITAL_COMMUNITY)
Admission: EM | Admit: 2016-12-17 | Discharge: 2016-12-17 | Disposition: A | Discharge status: Home / Self Care | Payer: Medicare Other | Source: Home / Self Care

## 2016-12-17 ENCOUNTER — Ambulatory Visit (HOSPITAL_COMMUNITY): Admit: 2016-12-17 | Payer: Medicare Other | Admitting: Pulmonary Disease

## 2016-12-17 DIAGNOSIS — R918 Other nonspecific abnormal finding of lung field: Secondary | ICD-10-CM

## 2016-12-17 HISTORY — PX: ENDOBRONCHIAL ULTRASOUND: SHX5096

## 2016-12-17 LAB — DIFFERENTIAL
BASOS PCT: 0 %
Basophils Absolute: 0 10*3/uL (ref 0.0–0.1)
Eosinophils Absolute: 1.5 10*3/uL — ABNORMAL HIGH (ref 0.0–0.7)
Eosinophils Relative: 12 %
LYMPHS ABS: 1.2 10*3/uL (ref 0.7–4.0)
Lymphocytes Relative: 9 %
MONO ABS: 0.6 10*3/uL (ref 0.1–1.0)
MONOS PCT: 4 %
NEUTROS ABS: 9.6 10*3/uL — AB (ref 1.7–7.7)
Neutrophils Relative %: 75 %

## 2016-12-17 LAB — BASIC METABOLIC PANEL
Anion gap: 5 (ref 5–15)
BUN: 9 mg/dL (ref 6–20)
CO2: 34 mmol/L — AB (ref 22–32)
Calcium: 8.1 mg/dL — ABNORMAL LOW (ref 8.9–10.3)
Chloride: 101 mmol/L (ref 101–111)
Creatinine, Ser: 0.61 mg/dL (ref 0.44–1.00)
GFR calc Af Amer: >60 mL/min (ref >60)
Glucose, Bld: 95 mg/dL (ref 65–99)
Potassium: 4.3 mmol/L (ref 3.5–5.1)
Sodium: 140 mmol/L (ref 135–145)

## 2016-12-17 LAB — GLUCOSE, CAPILLARY
GLUCOSE-CAPILLARY: 113 mg/dL — AB (ref 65–99)
GLUCOSE-CAPILLARY: 118 mg/dL — AB (ref 65–99)
GLUCOSE-CAPILLARY: 82 mg/dL (ref 65–99)
GLUCOSE-CAPILLARY: 95 mg/dL (ref 65–99)
Glucose-Capillary: 100 mg/dL — ABNORMAL HIGH (ref 65–99)
Glucose-Capillary: 104 mg/dL — ABNORMAL HIGH (ref 65–99)
Glucose-Capillary: 141 mg/dL — ABNORMAL HIGH (ref 65–99)
Glucose-Capillary: 143 mg/dL — ABNORMAL HIGH (ref 65–99)
Glucose-Capillary: 221 mg/dL — ABNORMAL HIGH (ref 65–99)
Glucose-Capillary: 77 mg/dL (ref 65–99)
Glucose-Capillary: 83 mg/dL (ref 65–99)
Glucose-Capillary: 85 mg/dL (ref 65–99)
Glucose-Capillary: 90 mg/dL (ref 65–99)
Glucose-Capillary: 90 mg/dL (ref 65–99)
Glucose-Capillary: 96 mg/dL (ref 65–99)

## 2016-12-17 LAB — CBC
HCT: 34.7 % — ABNORMAL LOW (ref 36.0–46.0)
Hemoglobin: 11.3 g/dL — ABNORMAL LOW (ref 12.0–15.0)
MCH: 30.8 pg (ref 26.0–34.0)
MCHC: 32.6 g/dL (ref 30.0–36.0)
MCV: 94.6 fL (ref 78.0–100.0)
PLATELETS: 336 10*3/uL (ref 150–400)
RBC: 3.67 MIL/uL — AB (ref 3.87–5.11)
RDW: 14.6 % (ref 11.5–15.5)
WBC: 13 10*3/uL — ABNORMAL HIGH (ref 4.0–10.5)

## 2016-12-17 LAB — MAGNESIUM: Magnesium: 1.9 mg/dL (ref 1.7–2.4)

## 2016-12-17 LAB — VANCOMYCIN, TROUGH: Vancomycin Tr: 21 ug/mL (ref 15–20)

## 2016-12-17 SURGERY — ENDOBRONCHIAL ULTRASOUND (EBUS)
Anesthesia: General | Laterality: Bilateral

## 2016-12-17 MED ORDER — ENOXAPARIN SODIUM 40 MG/0.4ML ~~LOC~~ SOLN: 40.0000 mg | SUBCUTANEOUS | Status: DC

## 2016-12-17 MED ORDER — DEXAMETHASONE SODIUM PHOSPHATE 10 MG/ML IJ SOLN
INTRAMUSCULAR | Status: DC | PRN
  Administered 2016-12-17: 10 mg via INTRAVENOUS

## 2016-12-17 MED ORDER — VANCOMYCIN HCL IN DEXTROSE 1-5 GM/200ML-% IV SOLN
1000.0000 mg | Freq: Two times a day (BID) | INTRAVENOUS | Status: DC
  Administered 2016-12-17 – 2016-12-19 (×6): 1000 mg via INTRAVENOUS
  Filled 2016-12-17 (×6): qty 200

## 2016-12-17 MED ORDER — SUGAMMADEX SODIUM 200 MG/2ML IV SOLN
INTRAVENOUS | Status: AC
  Filled 2016-12-17: qty 2

## 2016-12-17 MED ORDER — SUCCINYLCHOLINE CHLORIDE 200 MG/10ML IV SOSY
PREFILLED_SYRINGE | INTRAVENOUS | Status: DC | PRN
  Administered 2016-12-17: 120 mg via INTRAVENOUS

## 2016-12-17 MED ORDER — LOSARTAN POTASSIUM 50 MG PO TABS
100.0000 mg | ORAL_TABLET | Freq: Every day | ORAL | Status: DC
  Administered 2016-12-17 – 2016-12-26 (×10): 100 mg via ORAL
  Filled 2016-12-17 (×11): qty 2

## 2016-12-17 MED ORDER — PHENYLEPHRINE 40 MCG/ML (10ML) SYRINGE FOR IV PUSH (FOR BLOOD PRESSURE SUPPORT)
PREFILLED_SYRINGE | INTRAVENOUS | Status: AC
  Filled 2016-12-17: qty 10

## 2016-12-17 MED ORDER — SUCCINYLCHOLINE CHLORIDE 200 MG/10ML IV SOSY
PREFILLED_SYRINGE | INTRAVENOUS | Status: AC
  Filled 2016-12-17: qty 10

## 2016-12-17 MED ORDER — ENOXAPARIN SODIUM 40 MG/0.4ML ~~LOC~~ SOLN
40.0000 mg | SUBCUTANEOUS | Status: DC
  Administered 2016-12-18 – 2016-12-20 (×3): 40 mg via SUBCUTANEOUS
  Filled 2016-12-17 (×4): qty 0.4

## 2016-12-17 MED ORDER — SODIUM CHLORIDE 0.9 % IV SOLN: Freq: Once | INTRAVENOUS | Status: DC

## 2016-12-17 MED ORDER — HYDROCHLOROTHIAZIDE 25 MG PO TABS
25.0000 mg | ORAL_TABLET | Freq: Every day | ORAL | Status: DC
  Administered 2016-12-17 – 2016-12-25 (×9): 25 mg via ORAL
  Filled 2016-12-17 (×9): qty 1

## 2016-12-17 MED ORDER — LOSARTAN POTASSIUM-HCTZ 100-25 MG PO TABS: 1.0000 | ORAL_TABLET | Freq: Every day | ORAL | Status: DC

## 2016-12-17 MED ORDER — ALBUTEROL SULFATE HFA 108 (90 BASE) MCG/ACT IN AERS
INHALATION_SPRAY | RESPIRATORY_TRACT | Status: AC
  Filled 2016-12-17: qty 6.7

## 2016-12-17 MED ORDER — MIDAZOLAM HCL 2 MG/2ML IJ SOLN
INTRAMUSCULAR | Status: AC
  Filled 2016-12-17: qty 2

## 2016-12-17 MED ORDER — LIDOCAINE 2% (20 MG/ML) 5 ML SYRINGE
INTRAMUSCULAR | Status: DC | PRN
  Administered 2016-12-17: 100 mg via INTRAVENOUS

## 2016-12-17 MED ORDER — SODIUM CHLORIDE 0.9 % IV SOLN
INTRAVENOUS | Status: DC | PRN
  Administered 2016-12-17: 07:00:00 via INTRAVENOUS

## 2016-12-17 MED ORDER — PHENYLEPHRINE 40 MCG/ML (10ML) SYRINGE FOR IV PUSH (FOR BLOOD PRESSURE SUPPORT)
PREFILLED_SYRINGE | INTRAVENOUS | Status: DC | PRN
  Administered 2016-12-17 (×3): 80 ug via INTRAVENOUS

## 2016-12-17 MED ORDER — DEXAMETHASONE SODIUM PHOSPHATE 10 MG/ML IJ SOLN
INTRAMUSCULAR | Status: AC
  Filled 2016-12-17: qty 1

## 2016-12-17 MED ORDER — LIDOCAINE 2% (20 MG/ML) 5 ML SYRINGE
INTRAMUSCULAR | Status: AC
  Filled 2016-12-17: qty 5

## 2016-12-17 MED ORDER — ROCURONIUM BROMIDE 50 MG/5ML IV SOSY
PREFILLED_SYRINGE | INTRAVENOUS | Status: AC
  Filled 2016-12-17: qty 5

## 2016-12-17 MED ORDER — LACTATED RINGERS IV SOLN
INTRAVENOUS | Status: DC | PRN
  Administered 2016-12-17: 07:00:00 via INTRAVENOUS

## 2016-12-17 MED ORDER — FENTANYL CITRATE (PF) 100 MCG/2ML IJ SOLN
INTRAMUSCULAR | Status: DC | PRN
  Administered 2016-12-17: 50 ug via INTRAVENOUS

## 2016-12-17 MED ORDER — LACTATED RINGERS IV SOLN: INTRAVENOUS | Status: DC

## 2016-12-17 MED ORDER — PROPOFOL 10 MG/ML IV BOLUS
INTRAVENOUS | Status: DC | PRN
  Administered 2016-12-17: 110 mg via INTRAVENOUS

## 2016-12-17 MED ORDER — PROPOFOL 10 MG/ML IV BOLUS
INTRAVENOUS | Status: AC
  Filled 2016-12-17: qty 20

## 2016-12-17 MED ORDER — ONDANSETRON HCL 4 MG/2ML IJ SOLN
INTRAMUSCULAR | Status: AC
  Filled 2016-12-17: qty 2

## 2016-12-17 MED ORDER — FENTANYL CITRATE (PF) 250 MCG/5ML IJ SOLN
INTRAMUSCULAR | Status: AC
  Filled 2016-12-17: qty 5

## 2016-12-17 NOTE — Progress Notes (Signed)
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 A little sleepy from lung bx. Has less abd discomfort. No n/v. Ate entire lunch  Objective: Vital signs in last 24 hours: Temp:  [97.5 F (36.4 C)-99.8 F (37.7 C)] 97.5 F (36.4 C) (03/19 0723) Pulse Rate:  [64-101] 86 (03/19 0723) Resp:  [14-24] 24 (03/19 0723) BP: (148-210)/(54-95) 173/95 (03/19 0723) SpO2:  [84 %-98 %] 96 % (03/19 0723) Weight:  [102.1 kg (225 lb)] 102.1 kg (225 lb) (03/19 0723) Last BM Date: 12/15/16 PO 1320 yesterday 485 IV Voided x 3 BM x 2 Afebrile, BP up this AM, sats OK on nasal cannula BMP OK  WBC 13K IR drain 12/14/16   Intake/Output from previous day: 03/18 0701 - 03/19 0700 In: 2465 [P.O.:1320; I.V.:485; IV Piggyback:650] Out: 6.5 [Drains:6.5] Intake/Output this shift: No intake/output data recorded.  A little sleepy Mild b/l exp wheezes Reg Obese, soft, nt, drains - no fluid No UE edema Trace LE edema  Lab Results:   Recent Labs  12/15/16 0452 12/17/16 0450  WBC 10.4 13.0*  HGB 10.6* 11.3*  HCT 32.0* 34.7*  PLT 352 336    BMET  Recent Labs  12/16/16 0457 12/17/16 0450  NA 139 140  K 3.2* 4.3  CL 101 101  CO2 33* 34*  GLUCOSE 100* 95  BUN 9 9  CREATININE 0.56 0.61  CALCIUM 8.0* 8.1*   PT/INR No results for input(s): LABPROT, INR in the last 72 hours.   Recent Labs Lab 12/15/16 0452  AST 10*  ALT 9*  ALKPHOS 57  BILITOT 0.8  PROT 4.8*  ALBUMIN 1.9*     Lipase     Component Value Date/Time   LIPASE 15 12/03/2016 1613     Studies/Results: No results found.  Medications: . sodium chloride   Intravenous Once  . [MAR Hold] amLODipine  10 mg Oral Daily  . [MAR Hold] cholecalciferol  1,000 Units Oral Daily  . [MAR Hold] citalopram  10 mg Oral Daily  . [MAR Hold] ertapenem  1 g Intravenous Q24H  . [MAR Hold] fluconazole  200 mg Oral Daily  . [MAR Hold] heparin  5,000 Units Subcutaneous Q8H  . [MAR Hold] ipratropium-albuterol  3 mL Nebulization TID  . [MAR  Hold] lip balm  1 application Topical BID  . [MAR Hold] metoprolol  150 mg Oral Daily  . [MAR Hold] mometasone-formoterol  2 puff Inhalation BID  . [MAR Hold] pantoprazole  40 mg Oral Q1200  . [MAR Hold] polyethylene glycol  17 g Oral Daily  . [MAR Hold] potassium chloride  40 mEq Oral BID  . [MAR Hold] saccharomyces boulardii  250 mg Oral BID  . [MAR Hold] sodium chloride flush  3 mL Intravenous Q12H  . [MAR Hold] vancomycin  1,000 mg Intravenous Q12H   . lactated ringers     Assessment/Plan Ruptured appendicitis - no abdominal surgical history - CT scan showed perforated appendicitis with 2.9 x 5.8 x 5 cm RIGHT lower quadrant contained perforation/abscess - drain with 10cc/24hr purulent drainage  - s/p IR drain 3/6 &3/16, culture: enterococcus faecalis  Rash  Bilateral pleural effusions- thoracentesis not performed 3/13 due to limited amount of fluid COPD - on O2 at home. on Dulera 2 puffs twice a day. Patient will need repeat pulmonary function testing as an outpatient as well as screening for alpha-1 antitrypsin deficiency RIGHT lower lobe mass/ Right-sided endobronchial obstruction- s/p bronchoscopy, EUS with washings/FNA 3/19 by Dr Ashok Cordia; f/u cytology Recent pneumonia- earlier this  year HTN - metoprolol, norvasc, hydralazine PRN; add last remaining home BP med back 3/19- hyzar Former smoker- quit earlier this year  ID  Day 14 abx -  zosyn 3/5>>3/8, cipro 3/8>>3/10, flagyl 3/8>>3/10, vanco 3/9>>, invanz 3/10>> day 9 FEN - IVF, soft diet VTE - SCDs, resume lovenox this evening 3/19  Dispo - I think she is getting ready for placement; tolerating diet, abd exam improving, no fever, abscesses are drained - will consult case management to begin SNF journey     LOS: 14 days    Leighton Ruff. Redmond Pulling, MD, FACS General, Bariatric, & Minimally Invasive Surgery [facility], [place]

## 2016-12-17 NOTE — Consult Note (Signed)
[facility] Consult  Physician Requesting Consult:  Armandina Gemma, M.D. / CCS  Date of Initial Consult:  12/10/16  Reason for Consult/Chief Complaint:  Endobronchial Obstruction  History of Presenting Illness:  71 y.o. female with a history of emphysema and essential hypertension admitted in February 2010 at outside hospital. She was found to have what appeared to be an endobronchial mass causing subsequent collapse with in her right lower lobe. She was evaluated by pulmonary at outside hospital and the plan was for the patient to undergo bronchoscopy on 3/7. Patient was admitted on 3/5 to our facility with a ruptured appendix and associated peritonitis/abscess. She does continue to have intermittent dyspnea. She denies any chronic chest discomfort but did experience a "heaviness" in her chest yesterday that spontaneously resolved. She denies any dysphagia or odynophagia. She denies any headache or focal vision changes. She does endorse some intermittent left thigh numbness but no other focal weakness or tingling. She denies any subjective chills or sweats. She denies any weight loss. Of note on the patient was on Zosyn she developed a rash over her whole body as well as some sloughing of her oral mucous membranes. She denies any abnormal bruising. She does report that she has had some dyspnea since 2013 approximately.  In the interim patient seems to be clinically improving. Denies any subjective fever or chills. Denies any chest pain or pressure. He reports continued intermittent cough which is largely nonproductive.  Review of Systems:  No nausea or emesis. No abdominal pain at this time. A pertinent 14 point review of systems is negative except as per the history of presenting illness.  Allergies  Allergen Reactions  . Ciprofloxacin Rash    Rash possibly caused by Cipro?  . Flagyl [Metronidazole] Rash    Rash possibly caused by Flagyl?  Marland Kitchen Zosyn [Piperacillin Sod-Tazobactam  So] Itching and Rash    Has patient had a PCN reaction causing immediate rash, facial/tongue/throat swelling, SOB or lightheadedness with hypotension: No Has patient had a PCN reaction causing severe rash involving mucus membranes or skin necrosis: No Has patient had a PCN reaction that required hospitalization: No Has patient had a PCN reaction occurring within the last 10 years: Yes If all of the above answers are "NO", then may proceed with Cephalosporin use.     No current facility-administered medications on file prior to encounter.    Current Outpatient Prescriptions on File Prior to Encounter  Medication Sig Dispense Refill  . ADVAIR DISKUS 250-50 MCG/DOSE AEPB Inhale 1 puff into the lungs Twice daily.    Marland Kitchen losartan-hydrochlorothiazide (HYZAAR) 100-25 MG per tablet Take 1 tablet by mouth daily. 90 tablet 0  . metoprolol (LOPRESSOR) 100 MG tablet Take 1.5 tablets (150 mg total) by mouth daily. 135 tablet 3  . amLODipine (NORVASC) 5 MG tablet Take 1 tablet (5 mg total) by mouth daily. (Patient not taking: Reported on 12/03/2016) 90 tablet 3    Past Medical History:  Diagnosis Date  . COPD, severe ([place])   . HTN (hypertension)   . Pneumonia   . Pulmonary emphysema ([place])   . SOB (shortness of breath)     Past Surgical History:  Procedure Laterality Date  . CATARACT EXTRACTION      Family History  Problem Relation Age of Onset  . Allergies    . COPD Cousin   . Breast cancer Mother   . Diabetes Father   . Diabetes Son     Social History  Social History  . Marital status: Widowed    Spouse name: N/A  . Number of children: N/A  . Years of education: N/A   Occupational History  . retired    Social History Main Topics  . Smoking status: Former Smoker    Packs/day: 0.50    Years: 50.00    Types: Cigarettes    Quit date: 11/01/2016  . Smokeless tobacco: Never Used  . Alcohol use No  . Drug use: No  . Sexual activity: Not Asked   Other Topics Concern  . None    Social History Narrative   [facility] (12/10/16):   Patient's widower son and grandchildren moved in with her. Her husband passed years ago. Previously worked in [employer].    Temp:  [97.5 F (36.4 C)-99.8 F (37.7 C)] 99.8 F (37.7 C) (03/18 2212) Pulse Rate:  [64-101] 81 (03/18 2212) Resp:  [14-18] 18 (03/18 2212) BP: (148-210)/(54-83) 174/81 (03/18 2212) SpO2:  [84 %-98 %] 93 % (03/19 0456)  General:  Awake now. No acute distress. Nursing staff at bedside. Obese. Integument:  Warm & dry. No rash or bruising on exposed skin.  Extremities:  No cyanosis or clubbing.  Lymphatics:  No appreciated cervical or supraclavicular lymphadenoapthy. HEENT:  Moist mucus membranes. No oral ulcers. No scleral icterus.  Cardiovascular:  Regular rate & rhythm. No edema. No JVD appreciated.  Pulmonary:  Coarse breath sounds bilaterally. Symmetric chest wall expansion. No accessory muscle use. Abdomen: Soft. Normal bowel sounds. Nondistended. Nontender. Musculoskeletal:  Normal bulk and tone. Hand grip strength 5/5 bilaterally. No joint deformity or effusion appreciated. Neurological:  Cranial nerves 2-12 grossly in tact. No meningismus. Moving all 4 extremities equally. Symmetric brachioradialis deep tendon reflexes. Psychiatric:  Mood and affect congruent. Oriented x4.   CBC Latest Ref Rng & Units 12/17/2016 12/15/2016 12/14/2016  WBC 4.0 - 10.5 K/uL 13.0(H) 10.4 16.1(H)  Hemoglobin 12.0 - 15.0 g/dL 11.3(L) 10.6(L) 11.5(L)  Hematocrit 36.0 - 46.0 % 34.7(L) 32.0(L) 34.4(L)  Platelets 150 - 400 K/uL 336 352 379   BMP Latest Ref Rng & Units 12/17/2016 12/16/2016 12/15/2016  Glucose 65 - 99 mg/dL 95 100(H) 99  BUN 6 - 20 mg/dL '9 9 8  '$ Creatinine 0.44 - 1.00 mg/dL 0.61 0.56 0.65  Sodium 135 - 145 mmol/L 140 139 137  Potassium 3.5 - 5.1 mmol/L 4.3 3.2(L) 3.6  Chloride 101 - 111 mmol/L 101 101 101  CO2 22 - 32 mmol/L 34(H) 33(H) 30  Calcium 8.9 - 10.3 mg/dL 8.1(L) 8.0(L) 8.1(L)     IMAGING/STUDIES: PFT 04/22/12: FVC 1.90 L (67%) FEV1 1.14 L (47%) FEV1/FVC 0.60 FEF 25-75 0.44 L (70%) negative bronchodilator response TLC 4.09 L (75%) RV 100% ERV 30% DLCO uncorrected 63% CT CHEST W/O 12/08/16:  Previously reviewed by me.Endobronchial obstruction with cut off in the right lower lobe and some evidence of obstruction of the right middle lobe bronchus. Subsequent collapse versus consolidation of right lower lobe and associated pleural effusion. Small left pleural effusion as well. Left-sided nodule which is spiculated. Pathologically enlarged subcarinal as well as one precarinal lymph node. Apical predominant emphysematous changes noted.  MICROBIOLOGY: Abscess Culture 3/6:  Enterococcus faecalis  ANTIBIOTICS: Zosyn 3/5 - 3/8 Cipro 3/8 (x1 dose) Flagyl 3/8 - 3/10 Vancomycin 3/9 >>> Invanz 3/10 >>> Fluconazole 3/18 >>>  ASSESSMENT/PLAN:  71 y.o. female admitted with ruptured appendicitis. Has a right sided lower lobe predominant endobronchial obstruction with long-standing history of tobacco use suspicious for malignancy. Discussed risks of procedure with  patient today including bleeding, infection, pneumothorax, medication allergy, vocal cord injury, and potentially death. The patient is consenting to undergo the procedure. Plan for bronchoscopy with airway inspection, biopsy, and endobronchial ultrasound exam and staging of the mediastinum.  Sonia Baller Ashok Cordia, M.D. [facility] Pager:  [phone] After 3pm or if no response, call [phone] 7:28 AM 12/17/16

## 2016-12-17 NOTE — Progress Notes (Signed)
CM consult for SNF placement. This should be a CSW referral. Will place referral for CSW for SNF placement. Marney Doctor RN,BSN,NCM [phone]

## 2016-12-17 NOTE — Transfer of Care (Signed)
Immediate Anesthesia Transfer of Care Note  Patient: Alisha Fisher  Procedure(s) Performed: Procedure(s): ENDOBRONCHIAL ULTRASOUND (Bilateral)  Patient Location: Endoscopy Unit  Anesthesia Type:General  Level of Consciousness: awake  Airway & Oxygen Therapy: Patient Spontanous Breathing and Patient connected to face mask oxygen  Post-op Assessment: Report given to RN and Post -op Vital signs reviewed and stable  Post vital signs: Reviewed and stable  Last Vitals:  Vitals:   12/16/16 2212 12/17/16 0723  BP: (!) 174/81 (!) 173/95  Pulse: 81 86  Resp: 18 (!) 24  Temp: 37.7 C 36.4 C    Last Pain:  Vitals:   12/17/16 0723  TempSrc: Oral  PainSc:       Patients Stated Pain Goal: 3 (02/40/97 3532)  Complications: No apparent anesthesia complications

## 2016-12-17 NOTE — Progress Notes (Signed)
Pharmacy Antibiotic Note  Alisha Fisher is a 71 y.o. female admitted on 12/03/2016 with sepsis.  Pharmacy has been consulted for Vancomycin dosing.  Vancomycin level 21, but issue if 3/18AM dose given or not.  Plan:  Change vancomycin dose back to  Vancomycin 1gm IV every 12 hours.  Goal trough 15-20 mcg/mL.  Height: '5\' 7"'$  (170.2 cm) Weight: 225 lb (102.1 kg) IBW/kg (Calculated) : 61.6  Temp (24hrs), Avg:98.7 F (37.1 C), Min:97.5 F (36.4 C), Max:99.8 F (37.7 C)   Recent Labs Lab 12/12/16 0440  12/13/16 0314 12/13/16 1752 12/14/16 0412 12/15/16 0452 12/16/16 0457 12/17/16 0450  WBC 12.8*  --  12.1*  --  16.1* 10.4  --  13.0*  CREATININE  --   < > 0.58  --  0.57 0.65 0.56 0.61  VANCOTROUGH  --   --   --  14*  --   --   --  21*  < > = values in this interval not displayed.  Estimated Creatinine Clearance: 82.7 mL/min (by C-G formula based on SCr of 0.61 mg/dL).    Allergies  Allergen Reactions  . Ciprofloxacin Rash    Rash possibly caused by Cipro?  . Flagyl [Metronidazole] Rash    Rash possibly caused by Flagyl?  Marland Kitchen Zosyn [Piperacillin Sod-Tazobactam So] Itching and Rash    Has patient had a PCN reaction causing immediate rash, facial/tongue/throat swelling, SOB or lightheadedness with hypotension: No Has patient had a PCN reaction causing severe rash involving mucus membranes or skin necrosis: No Has patient had a PCN reaction that required hospitalization: No Has patient had a PCN reaction occurring within the last 10 years: Yes If all of the above answers are "NO", then may proceed with Cephalosporin use.     Antimicrobials this admission: 3/6 Zosyn >> 3/8 3/8 Cipro >> 3/10 3/8 Flagyl >> 3/10 3/9 Vanc >> 3/10 Invanz >>  Dose adjustments this admission: 3/12 VT = 11 mcg/ml on 750 mg q12h 3/15 VT = 14 on 1g q12h - change to '1250mg'$  q12 3/19 VT= _21 on '1250mg'$  iv q12hr--Question if 3/18AM dose given--different charting noted--both RNs not available to  question--will take possible issue into consideration and change vancomycin back to 1gm iv q12hr   Microbiology results: 3/6 abscess cx: Enterococcus faecalis (sensitive to ampi, vanc), rare yeast  Thank you for allowing pharmacy to be a part of this patient's care.  Nani Skillern Crowford 12/17/2016 6:30 AM

## 2016-12-17 NOTE — Progress Notes (Signed)
PT Cancellation Note  Patient Details Name: Alisha Fisher MRN: [id number] DOB: [birth year]   Cancelled Treatment:    Reason Eval/Treat Not Completed: Patient at procedure or test/unavailable   [facility] 12/17/2016, 8:15 AM

## 2016-12-17 NOTE — Op Note (Signed)
Video Bronchoscopy Procedure Note  Pre-Procedure Diagnoses: 1.  Right Lung Endobronchial Mass/Obstruction  Post-Procedure Diagnoses: 1.  Bronchus Intermedius Endobronchial Obstruction  Procedures Performed: 1. Bronchoscopy with Airway Inspection 2. Endobronchial Fine Needle Aspiration Bronchus Intermedius Mass 3. Endobronchial Brushings Right Mainstem Bronchus 4. Endobronchial Ultrasound with Needle Aspiration  Consent:  Informed consent was obtained from the Patient after discussing the risks and benefits of the procedure including bleeding, infection, pneumothorax, medication allergy, vocal chord injury, and potentially death.  Sedation:  Administered by CRNA. Please refer to intra-procedure documentation.   Description of Procedure: Patient was brought back to the endoscopy procedure room.  A time out was performed to identify the correct patient and procedure.  Patient was laid recumbent and endotracheally.  Sedation was administered by anesthesia. Bite block was inserted.  Flexible bronchoscope was then inserted into the endotracheal tube and into the trachea.  An airway inspeciton was performed finding copious thin, clear secretions. There were no endobronchial lesions on the left. A mass was obstructing the bronchus intermedius with approximately 100% occlusion. Mass appeared to be somewhat fungating with gross mucinous features. There is also mucosal irregularity extending into the right mainstem bronchus to the orifice at the level of the carina. This extended also into the right upper lobe bronchus. The endobronchial scope could not be passed beyond the mass. A Wang needle biopsy was performed on the obstructing mass but resulted in a small amount of hemorrhage therefore further biopsy was aborted. Endobronchial brushings were performed on the mucosal irregularities within the right mainstem bronchus.  The flexible bronchoscope was then removed from the patient's airways after suctioning  of the remaining secretions.  The endobronchial ultrasound scope was then inserted into the endotracheal tube.  A 0.5 cm lymph node was visualized level 4R but no biopsy was performed. An enlarged lymph node at level 7 was identified with endobronchial ultrasound.  Under direct visualization with the ultrasound a total of 4 passes were performed at level 7.  The endobronchial ultrasound scope was then repositioned to level 11R and 3 additional passes were performed at this location. Hemostasis was directly visualized.  The patient was transitioned to recover and extubated by CRNA at the end of the procedure. Patient's sister [name] was updated via phone by me.   Blood Loss:  Less than 10cc.  Complications:  None.  Endobronchial Biopsy: 1. Performed on the Bronchus Intermedius Mass:  Sent for cytology.  Endobronchial Brushings: 1. Performed on the Right Mainstem Bronchus:  Sent for cytology.  EBUS Procedure: Level 4R:  0.5 cm lymph node / no biopsy performed  Level 4L:  No lymph node visualized Level 7:  multiple lymph nodes / 1.5cm lymph node largest / biopsy performed / ROSE / passes 1 - 4 for cytology & cell block Level 11R:  1cm lymph node / biopsy performed / ROSE / passes 5-7 for cytology & cell block  Post Procedure Stat Portable CXR:  Ordered and pending.  Post Procedure Instructions: 1. Patient to remain NPO for 1 hour post procedure 2. Heparin to remain on hold for 24 hours

## 2016-12-17 NOTE — Discharge Summary (Signed)
Physician Discharge Summary  Patient ID: Alisha Fisher MRN: [id number] DOB/AGE: 69-Jan-1947 69 y.o.  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 Javier Glazier  - CCM/pulmonary Dr. Burney Gauze  -  Oncology Dr. Debbe Odea - Medicine  Active Problems:   COPD (chronic obstructive pulmonary disease) ([place])   Acute appendicitis with perforation and peritoneal abscess   Intraperitoneal abscess s/p perc drainage 12/14/2016   Squamous cell lung cancer, right ([place])   Acute respiratory failure with hypoxia ([place])   Obesity   Smoker   Endobronchial mass   Acute on chronic respiratory failure with hypoxia and hypercapnia ([facility])   Essential hypertension   Rash and nonspecific skin eruption   Hypokalemia   Hypomagnesemia   Anemia   Acute pain of left thigh   PROCEDURES:  CT chest 12/03/16 =>> RLL mass CT guided pelvic drain placement 12/04/16 -  CT scan and IR drain placed 12/14/16, left trans gluteal approach 1. Bronchoscopy with Airway Inspection 2. Endobronchial Fine Needle Aspiration Bronchus Intermedius Mass 3. Endobronchial Brushings Right Mainstem Bronchus 4. Endobronchial Ultrasound with Needle Aspiration Radiation therapy -IR axillary node  bx 3/23: Pending  Hospital Course: Chronically ill patient on home O2, presented with three-day history of right lower quadrant abdominal pain. CT scan showed a perforated appendicitis. She also has a mass and her right lower lobe that requires evaluation. She was seen in the ED by Dr. Johnathan Hausen and admitted. IT was his recommendation she undergo percutaneous drainage by IR. Bowel rest, IV hydration and IV antibiotics. In addition to her acute appendicitis with rupture she had problems with itching after antibiotic were started. It's not clear what caused this. Some of her medications were adjusted, and she was placed on PRN Benadryl. She was treated with Zosyn and then to Cipro/Flagyl, with each antibiotic she had rash and itching, she was eventually changed her to [facility], symptoms persisted but were stable on Invanz A  Second IR drain was placed on 12/14/16 left abdominal fluid collection.  No cultures noted in EPIC.   She also had issues with O2 saturations and more specifically desaturation. Pulmonary medicine consult was obtained and she was seen by Dr. Javier Glazier. She had a pleural effusion that underwent ultrasound that showed a small loculated pleural effusion and thoracentesis was postponed. She underwent bronchoscopy with endobronchial fine needle aspiration as noted above. She was found to have bronchus intermedius endobronchial obstruction. Pathology revealed:BRONCHIAL BRUSHING, RIGHT MAINSTEM(SPECIMEN 4 OF 4 COLLECTED 12/17/16): SQUAMOUS CELL CARCINOMA.  Patient's had a good deal of anxiety throughout her hospital course treated with benzodiazepines. On 12/18/16, she desaturated shortly after getting IV Ativan.  Something she had been receiving on a regular basis.   Blood gases were obtained along with a chest x-ray. This showed fluid overload. PH is 7.142, PCO2 of 116. To 85 and a bicarbonate of  38.2. She was seen by CCM and transferred to the ICU and placed on BiPAP. She also was  started on IV Lasix for her fluid overload. She improved with this treatment and return to nasal cannula for adequate O2 saturations. Currently she is on BiPAP at night, and O2 at 2 L now during the day.   He was then seen in consultation by Dr. Burney Gauze. He recommended radiation therapy and she was seen by radiation oncology.  She was started on beam photon therapy with 10 treatments planned. She should complete 4/10 treatments in the a.m. prior to discharge 12/26/16.   Antibiotic therapyfor her abscess:day 16antibiotics   Zosyn 12/03/16-12/05/16; Cipro/flagyl 3/8-3/10/18; Both stopped for rash/possible allergy Invanz 3/10 =>>day 12 Vancomycin 3/09=>>day14 Diflucan 3/18 =>>day 5 All antibiotics were discontinued on 12/20/16.   Repeat CT scan/fistula injection performed on 11/2316.  This showed resolution of the abscess but the fistula injection demonstrates immediate filling cecum small amount of contrast decompresses through the skin side along the tract of the drain. The impression:Status post drain injection of right lower quadrant in the region of prior ruptured appendicitis, confirming fistulous connection of the drainage catheter with the appendix. It appears as though the ruptured appendix has closed around the drain, with the current drain acting as a cecostomy tube. At this time we plan to leave to drainage tube in place. She is tolerating a soft diet. With minimal drainage. It was Dr. Dois Davenport opinion that the drainage tube remain in place at this time.  It was noted patient had significant fluid overload and anasarca and she was placed on IV Lasix her weight on admission was 102 kg. It went up to 126 kg and with diuresis we currently have her down to 108 kg.  The rash she developed in the hospital was initially associated with antibiotics and these were changed twice from Zosyn to Cipro/Flagyl. From Cipro/Flagyl to Colbert Ewing all this was recorded to the rash. She also had some  rash post CT scan on 12/21/16, with some concern this might be associated with the contrast media also.  Currently she is on a soft diet, she is stable from a respiratory point with O2 at 2 L/m during the day and BiPAP 6/16 and 4 L O2 bled in at bedtime while sleeping. Her weight is down but Dr. Wynelle Cleveland plans to keep her on daily Lasix after discharge. She has adjusted her medications for discharge. Tentatively plan discharge to skilled nursing facility.  CBC Latest Ref Rng & Units 12/24/2016 12/22/2016 12/21/2016  WBC 4.0 - 10.5 K/uL 7.5 11.2(H) 12.1(H)  Hemoglobin 12.0 - 15.0 g/dL 9.9(L) 9.9(L) 9.7(L)  Hematocrit 36.0 - 46.0 % 29.9(L) 31.2(L) 30.1(L)  Platelets 150 - 400 K/uL 203 197 207   CMP Latest Ref Rng & Units 12/26/2016 12/25/2016 12/24/2016  Glucose 65 - 99 mg/dL 82 - 80  BUN 6 - 20 mg/dL 31(H) - 36(H)  Creatinine 0.44 - 1.00 mg/dL 0.89 0.90 0.90  Sodium 135 - 145 mmol/L 133(L) - 135  Potassium 3.5 - 5.1 mmol/L 4.0 - 3.4(L)  Chloride 101 - 111 mmol/L 86(L) - 80(L)  CO2 22 - 32 mmol/L 40(H) - 47(H)  Calcium 8.9 - 10.3 mg/dL 8.5(L) - 8.5(L)  Total Protein 6.5 - 8.1 g/dL - - -  Total Bilirubin 0.3 - 1.2 mg/dL - - -  Alkaline Phos 38 - 126 U/L - - -  AST 15 - 41 U/L - - -  ALT 14 - 54 U/L - - -  CT scan 3/232/18, Impression:  1. Slight retraction of peri-appendiceal drainage catheter however tip remains within residual approximately 3.8 cm residual air and fluid collection. 2. Interval resolution of pelvic fluid collection following left-sided trans gluteal percutaneous drainage catheter placement. Note, the left-sided percutaneous drainage catheter has also been slightly retracted though tip remains well positioned within the midline of the lower pelvis. 3. No new definable/drainable abdominal or pelvic fluid collections. 4. Large colonic stool burden without evidence of enteric obstruction. 5. Similar appearance of known right lower lobe mass as previously characterized on  dedicated chest CT performed 11/14/2016. 6. Increased bilateral pleural effusions with worsening left basilar atelectasis, though note, underlying infection is not excluded. 7. Cardiomegaly with increased size of bilateral pleural effusions and worsening diffuse body wall edema, constellation of findings worrisome for CHF. 8. Nodularity hepatic contour suggestive of early cirrhotic change. Correlation LFTs is recommended. 9. Aortic Atherosclerosis (ICD10-170.0) Suspected hemodynamically significant stenosis involving the right common iliac artery, incompletely evaluated on this non CTA examination. Correlation for right lower extremity PAD symptoms is recommended.  IR sinus fistulgram; 12/21/16:  Status post drain injection of right lower quadrant in the region of prior ruptured appendicitis, confirming fistulous connection of the drainage catheter with the appendix.  It appears as though the ruptured appendix has closed around the drain, with the current drain acting as a cecostomy tube.  Brain MRI 12/24/16: 1. Moderate to severe motion artifact. If clinically indicated, short interval follow-up when patient is Fisher able to tolerate examination is recommended. 2. No definite evidence of intracranial metastatic disease. 3. Mild chronic microvascular ischemic changes and mild parenchymal volume loss of the brain.  Lymph node Bx still pending  Disposition: SNF  DR. Wynelle Cleveland WANTS A 1 LITER PER DAY FLUID RESTRICTION.  Discharge Instructions    Bipap    Complete by:  As directed    Keep O2 sat greater than 93%. 16/6 O2 at 4 L Bled into circuit. Further orders follow-up with critical care medicine/pulmonary care   Discharge instructions    Complete by:  As directed    Low sodium, heart healthy, 1200 fluid restriction- drinking a great deal of fluids Daily weights - adjust dose of Lasix as needed-   PR Dressing and/or debridement, medium burn, no anesthesia    Complete by:  As  directed    Clean drain site daily with soap and water.  Keep something in the panus to keep this area dry.  Irrigate drain with 5 ml of sterile normal saline, TID.     Allergies as of 12/26/2016      Reactions   Ciprofloxacin Rash   Rash possibly caused by Cipro?   Flagyl [metronidazole] Rash   Rash possibly caused by Flagyl?   Zosyn [piperacillin Sod-tazobactam So] Itching, Rash   Has patient had a PCN reaction causing immediate rash, facial/tongue/throat swelling, SOB or lightheadedness with hypotension: No Has patient had a PCN reaction causing severe rash involving mucus membranes or skin necrosis: No Has patient had a PCN reaction that required hospitalization: No Has patient had a PCN reaction occurring within the last 10 years: Yes If all of the above answers are "NO", then may proceed with Cephalosporin use.      Medication List    STOP taking these medications   ADVAIR DISKUS 250-50 MCG/DOSE Aepb Generic drug:  Fluticasone-Salmeterol   ALPRAZolam 0.5 MG tablet Commonly known as:  XANAX   amLODipine 10 MG tablet Commonly known as:  NORVASC   amLODipine 5  MG tablet Commonly known as:  NORVASC   losartan-hydrochlorothiazide 100-25 MG tablet Commonly known as:  HYZAAR     TAKE these medications   acetaminophen 325 MG tablet Commonly known as:  TYLENOL Take 2 tablets (650 mg total) by mouth every 6 (six) hours as needed for mild pain (breakthrough pain).   albuterol 108 (90 Base) MCG/ACT inhaler Commonly known as:  PROVENTIL HFA;VENTOLIN HFA Inhale 2 puffs into the lungs every 6 (six) hours as needed for wheezing or shortness of breath.   arformoterol 15 MCG/2ML Nebu Commonly known as:  BROVANA Take 2 mLs (15 mcg total) by nebulization every 12 (twelve) hours.   budesonide 0.5 MG/2ML nebulizer solution Commonly known as:  PULMICORT Take 2 mLs (0.5 mg total) by nebulization every 12 (twelve) hours.   citalopram 10 MG tablet Commonly known as:  CELEXA Take  10 mg by mouth daily.   famotidine 40 MG tablet Commonly known as:  PEPCID Take 1 tablet (40 mg total) by mouth 2 (two) times daily.   furosemide 40 MG tablet Commonly known as:  LASIX Take 1 tablet (40 mg total) by mouth 2 (two) times daily. To be adjusted at SNF based on daily weights and renal function   gabapentin 100 MG capsule Commonly known as:  NEURONTIN Take 1 capsule (100 mg total) by mouth 3 (three) times daily.   losartan 100 MG tablet Commonly known as:  COZAAR Take 1 tablet (100 mg total) by mouth daily.   Metoprolol Tartrate 75 MG Tabs Take 150 mg by mouth 2 (two) times daily. What changed:  medication strength  when to take this   ondansetron 4 MG disintegrating tablet Commonly known as:  ZOFRAN-ODT Take 1 tablet (4 mg total) by mouth every 6 (six) hours as needed for nausea.   polyethylene glycol packet Commonly known as:  MIRALAX / GLYCOLAX Take 17 g by mouth daily. Hold for diarrhea   potassium chloride SA 20 MEQ tablet Commonly known as:  K-DUR,KLOR-CON Take 2 tablets (40 mEq total) by mouth 2 (two) times daily.   predniSONE 20 MG tablet Commonly known as:  DELTASONE Take 1 tablet (20 mg total) by mouth daily with breakfast. Taper to 10 mg tomorrow and 5 mg the next day   VITAMIN D PO Take 4,000-5,000 Units by mouth daily.            Durable Medical Equipment        Start     Ordered   12/10/16 0955  For home use only DME 4 wheeled rolling walker with seat  Once    Question:  Patient needs a walker to treat with the following condition  Answer:  Weakness   12/10/16 0955   12/10/16 0955  For home use only DME 3 n 1  Once     12/10/16 0955     Follow-up Information    TOTH III,PAUL S, MD Follow up.   Specialty:  General Surgery Why:  Call for follow-up appointment in 2 weeks. Contact information: [address] [place] [address] [phone]        Volanda Napoleon, MD Follow up.   Specialty:  Oncology Why:   Call for follow-up appointment after completion of the radiation therapy. Contact information: [address] [place] [address] [phone]        Tera Partridge, MD Follow up.   Specialty:  Pulmonary Disease Why:  Call for follow-up appointment in 2 weeks. Contact  information: [address] [place] [address] [phone]           Signed: Earnstine Regal 12/26/2016, 11:25 AM

## 2016-12-17 NOTE — Progress Notes (Signed)
Prior to EBUS procedure, regular bronchoscopy performed Intervention WANG needle biopsy Intervention bronchial brushings  Kathie Dike RRT

## 2016-12-17 NOTE — Anesthesia Procedure Notes (Signed)
Procedure Name: Intubation Date/Time: 12/17/2016 7:54 AM Performed by: Danley Danker L Patient Re-evaluated:Patient Re-evaluated prior to inductionIntubation Type: IV induction Ventilation: Mask ventilation without difficulty Laryngoscope Size: Miller and 2 Grade View: Grade I Tube type: Oral Tube size: 9.0 mm Number of attempts: 1 Airway Equipment and Method: Stylet Placement Confirmation: ETT inserted through vocal cords under direct vision,  positive ETCO2 and breath sounds checked- equal and bilateral Secured at: 21 cm Tube secured with: Tape Dental Injury: Teeth and Oropharynx as per pre-operative assessment

## 2016-12-17 NOTE — Anesthesia Preprocedure Evaluation (Addendum)
Anesthesia Evaluation  Patient identified by MRN, date of birth, ID band Patient awake    Reviewed: Allergy & Precautions, NPO status , Patient's Chart, lab work & pertinent test results, reviewed documented beta blocker date and time   History of Anesthesia Complications Negative for: history of anesthetic complications  Airway Mallampati: III  TM Distance: >3 FB Neck ROM: Full    Dental  (+) Dental Advisory Given   Pulmonary shortness of breath, COPD,  COPD inhaler, former smoker,  RML, RLL consolidation   + rhonchi    rales    Cardiovascular hypertension, Pt. on medications and Pt. on home beta blockers (-) angina Rhythm:Regular Rate:Normal     Neuro/Psych negative neurological ROS     GI/Hepatic Neg liver ROS, s/p abd abscess   Endo/Other  Morbid obesity  Renal/GU negative Renal ROS     Musculoskeletal   Abdominal (+) + obese,   Peds  Hematology negative hematology ROS (+)   Anesthesia Other Findings   Reproductive/Obstetrics                           Anesthesia Physical Anesthesia Plan  ASA: III  Anesthesia Plan: General   Post-op Pain Management:    Induction: Intravenous  Airway Management Planned: Oral ETT  Additional Equipment:   Intra-op Plan:   Post-operative Plan: Possible Post-op intubation/ventilation  Informed Consent: I have reviewed the patients History and Physical, chart, labs and discussed the procedure including the risks, benefits and alternatives for the proposed anesthesia with the patient or authorized representative who has indicated his/her understanding and acceptance.   Dental advisory given  Plan Discussed with: CRNA and Surgeon  Anesthesia Plan Comments: (Plan routine monitors, GETA)        Anesthesia Quick Evaluation

## 2016-12-17 NOTE — Progress Notes (Signed)
Patient transported to Endo via bed by [facility].  Patient is connected to O2 tank .

## 2016-12-17 NOTE — Anesthesia Postprocedure Evaluation (Signed)
Anesthesia Post Note  Patient: Alisha Fisher  Procedure(s) Performed: Procedure(s) (LRB): ENDOBRONCHIAL ULTRASOUND (Bilateral)  Patient location during evaluation: PACU Anesthesia Type: General Level of consciousness: awake and alert, oriented and patient cooperative Pain management: pain level controlled Vital Signs Assessment: post-procedure vital signs reviewed and stable Respiratory status: spontaneous breathing, nonlabored ventilation, respiratory function stable and patient connected to nasal cannula oxygen Cardiovascular status: blood pressure returned to baseline and stable Postop Assessment: no signs of nausea or vomiting Anesthetic complications: no       Last Vitals:  Vitals:   12/17/16 1000 12/17/16 1021  BP: (!) 197/67 (!) 188/72  Pulse:  81  Resp:  (!) 21  Temp:  36.4 C    Last Pain:  Vitals:   12/17/16 1021  TempSrc: Oral  PainSc:                  [name],[name]

## 2016-12-17 NOTE — Progress Notes (Signed)
Referring Physician(s): Toth,P  Supervising Physician: Corrie Mckusick  Patient Status:  [facility] - In-pt  Chief Complaint:  Pelvic abscesses  Subjective:  Pt s/p bronch earlier today; no acute changes; still has some pelvic soreness secondary to drains  Allergies: Ciprofloxacin; Flagyl [metronidazole]; and Zosyn [piperacillin sod-tazobactam so]  Medications: Prior to Admission medications   Medication Sig Start Date End Date Taking? Authorizing Provider  ADVAIR DISKUS 250-50 MCG/DOSE AEPB Inhale 1 puff into the lungs Twice daily. 03/10/12  Yes Historical Provider, MD  albuterol (PROVENTIL HFA;VENTOLIN HFA) 108 (90 Base) MCG/ACT inhaler Inhale 2 puffs into the lungs every 6 (six) hours as needed for wheezing or shortness of breath.   Yes Historical Provider, MD  ALPRAZolam Duanne Moron) 0.5 MG tablet Take 0.5 mg by mouth 3 (three) times daily as needed for anxiety.  11/21/16  Yes Historical Provider, MD  amLODipine (NORVASC) 10 MG tablet Take 10 mg by mouth daily. 09/11/16  Yes Historical Provider, MD  Cholecalciferol (VITAMIN D PO) Take 4,000-5,000 Units by mouth daily.   Yes Historical Provider, MD  citalopram (CELEXA) 10 MG tablet Take 10 mg by mouth daily. 11/21/16  Yes Historical Provider, MD  losartan-hydrochlorothiazide (HYZAAR) 100-25 MG per tablet Take 1 tablet by mouth daily. 05/04/14  Yes Belva Crome, MD  metoprolol (LOPRESSOR) 100 MG tablet Take 1.5 tablets (150 mg total) by mouth daily. 07/14/13  Yes Belva Crome, MD  amLODipine (NORVASC) 5 MG tablet Take 1 tablet (5 mg total) by mouth daily. Patient not taking: Reported on 12/03/2016 07/14/13   Belva Crome, MD     Vital Signs: BP (!) 150/94 (BP Location: Left Arm)   Pulse 78   Temp 97.5 F (36.4 C) (Oral)   Resp 20   Ht '5\' 7"'$  (1.702 m)   Wt 225 lb (102.1 kg)   SpO2 96%   BMI 35.24 kg/m   Physical Exam RLQ/left TG drains intact, outputs minimal; no new cx data  Imaging: Dg Chest Port 1 View  Result Date:  12/17/2016 CLINICAL DATA:  Status post bronchoscopy. EXAM: PORTABLE CHEST 1 VIEW COMPARISON:  CT chest 12/08/2016 FINDINGS: A right-sided PICC line is stable. The heart is enlarged. Atherosclerotic changes are present at the aortic arch. Mild pulmonary vascular congestion is present. Right greater than left pleural effusions are noted. Bibasilar airspace disease is associated. IMPRESSION: 1. Cardiomegaly and mild pulmonary vascular congestion. 2. Persistent bilateral pleural effusions, right greater than left. 3. Partial collapse of the right lower lobe in part related to central soft tissue mass. 4. No radiographic evidence for complication. Electronically Signed   By: San Morelle M.D.   On: 12/17/2016 10:08   Ct Image Guided Drainage By Percutaneous Catheter  Result Date: 12/14/2016 INDICATION: Postop pelvic fluid collection, status post perforated appendicitis, worsening white count EXAM: CT-GUIDED PELVIC FLUID COLLECTION DRAINAGE VIA A LEFT TRANS GLUTEAL APPROACH. MEDICATIONS: The patient is currently admitted to the hospital and receiving intravenous antibiotics. The antibiotics were administered within an appropriate time frame prior to the initiation of the procedure. ANESTHESIA/SEDATION: Fentanyl 200 mcg IV; Versed 3.0 mg IV Moderate Sedation Time:  21 minutes The patient was continuously monitored during the procedure by the interventional radiology nurse under my direct supervision. COMPLICATIONS: None. PROCEDURE: Informed written consent was obtained from the patient after a thorough discussion of the procedural risks, benefits and alternatives. All questions were addressed. Maximal Sterile Barrier Technique was utilized including caps, mask, sterile gowns, sterile gloves, sterile drape, hand hygiene and skin  antiseptic. A timeout was performed prior to the initiation of the procedure. Previous imaging reviewed. Patient positioned nearly prone. Noncontrast localization CT performed. The left  trans gluteal approach to the cul-de-sac pelvic fluid collection was localized. Overlying skin marked. Under sterile conditions and local anesthesia, a 17 gauge 16.8 cm access needle was advanced from a left trans gluteal posterior oblique approach into the fluid collection. Needle position confirmed with CT. Guidewire inserted. Guidewire position confirmed with CT. Tract dilatation performed to insert a 10 [place] drain. Drain catheter positioned within the fluid collection. Position confirmed with CT. Catheter secured with a Prolene suture and connected to external suction bulb. Sterile dressing applied. No immediate complication. Patient tolerated the procedure well. IMPRESSION: Successful CT-guided pelvic fluid collection drain insertion via left trans gluteal approach. Electronically Signed   By: Jerilynn Mages.  Shick M.D.   On: 12/14/2016 16:36    Labs:  CBC:  Recent Labs  12/13/16 0314 12/14/16 0412 12/15/16 0452 12/17/16 0450  WBC 12.1* 16.1* 10.4 13.0*  HGB 11.1* 11.5* 10.6* 11.3*  HCT 34.1* 34.4* 32.0* 34.7*  PLT 421* 379 352 336    COAGS:  Recent Labs  12/04/16 1005  INR 1.15    BMP:  Recent Labs  12/14/16 0412 12/15/16 0452 12/16/16 0457 12/17/16 0450  NA 138 137 139 140  K 3.6 3.6 3.2* 4.3  CL 102 101 101 101  CO2 32 30 33* 34*  GLUCOSE 114* 99 100* 95  BUN '6 8 9 9  '$ CALCIUM 8.3* 8.1* 8.0* 8.1*  CREATININE 0.57 0.65 0.56 0.61  GFRNONAA >60 >60 >60 >60  GFRAA >60 >60 >60 >60    LIVER FUNCTION TESTS:  Recent Labs  12/03/16 1613 12/15/16 0452  BILITOT 0.7 0.8  AST 19 10*  ALT 13* 9*  ALKPHOS 98 57  PROT 6.7 4.8*  ALBUMIN 2.8* 1.9*    Assessment and Plan: Perforated appendicitis with multiple intra-abdominal abscess s/p RLQ drain placed 12/04/16, and now with left transgluteal pelvic drain placed 12/14/16; AF; WBC 13(10); hgb stable; creat ok; rec f/u CT later this week (Friday) or early next week   Electronically Signed: D. Rowe Robert 12/17/2016, 2:46  PM   I spent a total of 15 minutes at the the patient's bedside AND on the patient's hospital floor or unit, greater than 50% of which was counseling/coordinating care for pelvic abscess drains    Patient ID: Alisha Fisher, female   DOB: November 07, 1945, 71 y.o.   MRN: [id number]

## 2016-12-18 ENCOUNTER — Inpatient Hospital Stay (HOSPITAL_COMMUNITY): Payer: Medicare Other

## 2016-12-18 DIAGNOSIS — D649 Anemia, unspecified: Secondary | ICD-10-CM

## 2016-12-18 DIAGNOSIS — J9611 Chronic respiratory failure with hypoxia: Secondary | ICD-10-CM

## 2016-12-18 DIAGNOSIS — I10 Essential (primary) hypertension: Secondary | ICD-10-CM

## 2016-12-18 DIAGNOSIS — K353 Acute appendicitis with localized peritonitis: Principal | ICD-10-CM

## 2016-12-18 LAB — BLOOD GAS, ARTERIAL
ACID-BASE EXCESS: 5.5 mmol/L — AB (ref 0.0–2.0)
Acid-Base Excess: 6.5 mmol/L — ABNORMAL HIGH (ref 0.0–2.0)
BICARBONATE: 38.2 mmol/L — AB (ref 20.0–28.0)
Bicarbonate: 36.2 mmol/L — ABNORMAL HIGH (ref 20.0–28.0)
DRAWN BY: 295031
FIO2: 100
FIO2: 60
O2 SAT: 92.8 %
O2 SAT: 96 %
PATIENT TEMPERATURE: 98.6
PCO2 ART: 116 mmHg — AB (ref 32.0–48.0)
PCO2 ART: 92.6 mmHg — AB (ref 32.0–48.0)
PEEP/CPAP: 6 cmH2O
PO2 ART: 85.7 mmHg (ref 83.0–108.0)
Patient temperature: 98.6
Pressure control: 14 cmH2O
pH, Arterial: 7.142 — CL (ref 7.350–7.450)
pH, Arterial: 7.217 — ABNORMAL LOW (ref 7.350–7.450)
pO2, Arterial: 95.8 mmHg (ref 83.0–108.0)

## 2016-12-18 LAB — BASIC METABOLIC PANEL
Anion gap: 5 (ref 5–15)
BUN: 11 mg/dL (ref 6–20)
CALCIUM: 8.4 mg/dL — AB (ref 8.9–10.3)
CO2: 36 mmol/L — ABNORMAL HIGH (ref 22–32)
CREATININE: 0.7 mg/dL (ref 0.44–1.00)
Chloride: 98 mmol/L — ABNORMAL LOW (ref 101–111)
Glucose, Bld: 120 mg/dL — ABNORMAL HIGH (ref 65–99)
Potassium: 4.3 mmol/L (ref 3.5–5.1)
SODIUM: 139 mmol/L (ref 135–145)

## 2016-12-18 MED ORDER — FLUMAZENIL 0.5 MG/5ML IV SOLN
INTRAVENOUS | Status: AC
  Administered 2016-12-18: 16:00:00
  Filled 2016-12-18: qty 5

## 2016-12-18 MED ORDER — CHLORHEXIDINE GLUCONATE 0.12 % MT SOLN
15.0000 mL | Freq: Two times a day (BID) | OROMUCOSAL | Status: DC
  Administered 2016-12-19 – 2016-12-25 (×13): 15 mL via OROMUCOSAL
  Filled 2016-12-18 (×13): qty 15

## 2016-12-18 MED ORDER — ARFORMOTEROL TARTRATE 15 MCG/2ML IN NEBU
15.0000 ug | INHALATION_SOLUTION | Freq: Two times a day (BID) | RESPIRATORY_TRACT | Status: DC
  Administered 2016-12-18 – 2016-12-25 (×15): 15 ug via RESPIRATORY_TRACT
  Filled 2016-12-18 (×16): qty 2

## 2016-12-18 MED ORDER — FLUMAZENIL 0.5 MG/5ML IV SOLN
0.5000 mg | Freq: Once | INTRAVENOUS | Status: AC
  Administered 2016-12-18: 0.5 mg via INTRAVENOUS

## 2016-12-18 MED ORDER — ORAL CARE MOUTH RINSE
15.0000 mL | Freq: Two times a day (BID) | OROMUCOSAL | Status: DC
  Administered 2016-12-19 – 2016-12-25 (×8): 15 mL via OROMUCOSAL

## 2016-12-18 MED ORDER — METHYLPREDNISOLONE SODIUM SUCC 125 MG IJ SOLR
60.0000 mg | Freq: Four times a day (QID) | INTRAMUSCULAR | Status: DC
  Administered 2016-12-18 – 2016-12-19 (×3): 60 mg via INTRAVENOUS
  Filled 2016-12-18 (×3): qty 2

## 2016-12-18 MED ORDER — FUROSEMIDE 10 MG/ML IJ SOLN
40.0000 mg | Freq: Two times a day (BID) | INTRAMUSCULAR | Status: DC
  Administered 2016-12-18 – 2016-12-24 (×12): 40 mg via INTRAVENOUS
  Filled 2016-12-18 (×11): qty 4

## 2016-12-18 MED ORDER — FLUMAZENIL 0.5 MG/5ML IV SOLN
0.5000 mg | Freq: Once | INTRAVENOUS | Status: AC
  Administered 2016-12-18: 0.5 mg via INTRAVENOUS
  Filled 2016-12-18: qty 5

## 2016-12-18 MED ORDER — BUDESONIDE 0.5 MG/2ML IN SUSP
0.5000 mg | Freq: Two times a day (BID) | RESPIRATORY_TRACT | Status: DC
Start: 2016-12-18 — End: 2016-12-26
  Administered 2016-12-18 – 2016-12-26 (×16): 0.5 mg via RESPIRATORY_TRACT
  Filled 2016-12-18 (×16): qty 2

## 2016-12-18 MED ORDER — FUROSEMIDE 10 MG/ML IJ SOLN: 40.0000 mg | Freq: Once | INTRAMUSCULAR | Status: DC

## 2016-12-18 NOTE — Progress Notes (Signed)
1 Day Post-Op  Subjective: She looks miserable, not moving very swollen and edematous.  She still has cellulitis/rash.  Not eating very much.  She has a walker and it looks like this is new since her pneumonia about 2 months ago.  Home O2 predates this but her sister says she was not using it much before the pneumonia . Drainage from the anterior drain on the right is feculent.  Drainage on the left is serosanguinous.    Objective: Vital signs in last 24 hours: Temp:  [97.4 F (36.3 C)-97.6 F (36.4 C)] 97.6 F (36.4 C) (03/20 0630) Pulse Rate:  [74-87] 86 (03/20 1003) Resp:  [16-20] 16 (03/20 0801) BP: (150-172)/(59-94) 172/80 (03/20 1003) SpO2:  [91 %-98 %] 93 % (03/20 0801) Last BM Date: 12/16/16 240 PO recorded 450 IV Voided x 6 Drains 20 Stool x 1 Afebrile, VSS Labs OK  Last WBC was 13.0 CXR yesterday:  . Cardiomegaly and mild pulmonary vascular congestion. 2. Persistent bilateral pleural effusions, right greater than left. 3. Partial collapse of the right lower lobe in part related to central soft tissue mass. 4. No radiographic evidence for complication.   Intake/Output from previous day: 03/19 0701 - 03/20 0700 In: 1160 [P.O.:240; I.V.:450; IV Piggyback:450] Out: 25 [Drains:20; Blood:5] Intake/Output this shift: Total I/O In: 13 [I.V.:3; Other:10] Out: -   General appearance: alert, cooperative, no distress and looks chronically uncomfortable.  marked anasarca, on O2, not eating much, not moving much.   Resp: clear to auscultation bilaterally and On O2, hard for her to even move in bed. GI: large abdomen, still has remenants of the rash.  few BS, Anterior drain is feculent and posterior drain is serosanguinous.   Extremities: she has generalized edema all over.  Lab Results:   Recent Labs  12/17/16 0450  WBC 13.0*  HGB 11.3*  HCT 34.7*  PLT 336    BMET  Recent Labs  12/17/16 0450 12/18/16 0458  NA 140 139  K 4.3 4.3  CL 101 98*  CO2 34* 36*   GLUCOSE 95 120*  BUN 9 11  CREATININE 0.61 0.70  CALCIUM 8.1* 8.4*   PT/INR No results for input(s): LABPROT, INR in the last 72 hours.   Recent Labs Lab 12/15/16 0452  AST 10*  ALT 9*  ALKPHOS 57  BILITOT 0.8  PROT 4.8*  ALBUMIN 1.9*     Lipase     Component Value Date/Time   LIPASE 15 12/03/2016 1613     Studies/Results: Dg Chest Port 1 View  Result Date: 12/17/2016 CLINICAL DATA:  Status post bronchoscopy. EXAM: PORTABLE CHEST 1 VIEW COMPARISON:  CT chest 12/08/2016 FINDINGS: A right-sided PICC line is stable. The heart is enlarged. Atherosclerotic changes are present at the aortic arch. Mild pulmonary vascular congestion is present. Right greater than left pleural effusions are noted. Bibasilar airspace disease is associated. IMPRESSION: 1. Cardiomegaly and mild pulmonary vascular congestion. 2. Persistent bilateral pleural effusions, right greater than left. 3. Partial collapse of the right lower lobe in part related to central soft tissue mass. 4. No radiographic evidence for complication. Electronically Signed   By: San Morelle M.D.   On: 12/17/2016 10:08    Medications: . sodium chloride   Intravenous Once  . amLODipine  10 mg Oral Daily  . cholecalciferol  1,000 Units Oral Daily  . citalopram  10 mg Oral Daily  . enoxaparin (LOVENOX) injection  40 mg Subcutaneous Q24H  . ertapenem  1 g Intravenous Q24H  .  fluconazole  200 mg Oral Daily  . losartan  100 mg Oral Daily   And  . hydrochlorothiazide  25 mg Oral Daily  . ipratropium-albuterol  3 mL Nebulization TID  . lip balm  1 application Topical BID  . metoprolol  150 mg Oral Daily  . mometasone-formoterol  2 puff Inhalation BID  . pantoprazole  40 mg Oral Q1200  . polyethylene glycol  17 g Oral Daily  . potassium chloride  40 mEq Oral BID  . saccharomyces boulardii  250 mg Oral BID  . sodium chloride flush  3 mL Intravenous Q12H  . 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 Nestor  COPD - on O2 at home Probable OSA Recent pneumonia - earlier this year Hospital rash - Concerns with CIPRO/Flagyl and Zosyn allergies  Anasarca/ very positive fluid balance HTN - Former smoker - quit earlier this year RIGHT lower lobe mass - stable from previous CT Body mass index is listed at 35.3  I doubt that is correct - checking weight again today FEN:  No IV fluids/soft diet ID: day 15 antibiotics Zosyn 12/03/16-12/05/16; Cipro/flagyl 3/8-3/10/18;  Both stopped for rash/possible allergy Invanz 3/10 =>> day 10  Vancomycin 3/09=>> day12  DVT:  Lovenox DVT: Lovenox    Plan:  Will review with Dr. Redmond Pulling, hoping to get her to SNF soon.  LOS: 15 days    Monica Zahler 12/18/[id number]

## 2016-12-18 NOTE — Progress Notes (Signed)
PT Cancellation Note  Patient Details Name: Alisha Fisher MRN: [id number] DOB: [birth year]   Cancelled Treatment:    Reason Eval/Treat Not Completed: Other (comment) Attempted 3x to see pt. 1st attempt, pt stating she needed to rest and to come back in an hour. 2nd attempt, returned an hour later and sister applying makeup to pt and pt refused to work with PT despite sister encouraging her too. Requested PT come back in 15-30 mins.  3rd attempt, returned 30 mins later and pt c/o breathing. o2 on 4 L/min and o2  Via nasal canula 81% and encouraged deep breathing and increased to 85%. Nursing notified and held off on PT.  Educated pt and sister on importance of OOB and that if breathing was better later in the day and PT was not around that nursing can A pt to the recliner.   Galen Manila 12/18/2016, 1:19 PM

## 2016-12-18 NOTE — Progress Notes (Signed)
[facility]  Subjective/interval Called emergently to bedside. Pt unresponsive.  Had received xanax as well as alprazolam for significant anxiety  On my arrival she was not even responding to arterial stick.   Objective Temp:  [97.4 F (36.3 C)-97.6 F (36.4 C)] 97.6 F (36.4 C) (03/20 0630) Pulse Rate:  [74-87] 77 (03/20 1330) Resp:  [16-20] 20 (03/20 1320) BP: (156-172)/(59-84) 172/80 (03/20 1003) SpO2:  [86 %-97 %] 86 % (03/20 1320) NRB General appearance:  71 Year old  Obese female, minimally responsive Eyes: anicteric sclerae, moist conjunctivae; PERRL, EOMI bilaterally. Mouth:  membranes and no mucosal ulcerations; normal hard and soft palate Neck: Trachea midline; neck supple, no JVD, neck is large  Lungs/chest: poor air movement. Was wheezing earlier. + accessory use  CV: RRR, no MRGs  Abdomen: Soft, non-tender; no masses or HSM Extremities: No peripheral edema or extremity lymphadenopathy Skin: Normal temperature, turgor and texture; no rash, ulcers or subcutaneous nodules Neuro/ Psych:lethargic. Opened eyes and responded some after romazicon    CBC Recent Labs     12/17/16  0450  WBC  13.0*  HGB  11.3*  HCT  34.7*  PLT  336    Coag's No results for input(s): APTT, INR in the last 72 hours.  BMET Recent Labs     12/16/16  0457  12/17/16  0450  12/18/16  0458  NA  139  140  139  K  3.2*  4.3  4.3  CL  101  101  98*  CO2  33*  34*  36*  BUN  '9  9  11  '$ CREATININE  0.56  0.61  0.70  GLUCOSE  100*  95  120*    Electrolytes Recent Labs     12/16/16  0457  12/17/16  0450  12/18/16  0458  CALCIUM  8.0*  8.1*  8.4*  MG  1.7  1.9   --   PHOS  3.5   --    --     Sepsis Markers No results for input(s): PROCALCITON, O2SATVEN in the last 72 hours.  Invalid input(s): LACTICACIDVEN  ABG Recent Labs     12/18/16  1330  PHART  7.142*  PCO2ART  116*  PO2ART  85.7    Liver Enzymes No results for input(s): AST, ALT, ALKPHOS,  BILITOT, ALBUMIN in the last 72 hours.  Cardiac Enzymes No results for input(s): TROPONINI, PROBNP in the last 72 hours.  Glucose Recent Labs     12/16/16  0840  12/16/16  1209  12/16/16  1658  12/16/16  2009  12/17/16  0008  12/17/16  0447  GLUCAP  83  100*  118*  221*  141*  77    Imaging Dg Chest Port 1 View  Result Date: 12/18/2016 CLINICAL DATA:  Low oxygen saturation. EXAM: PORTABLE CHEST 1 VIEW COMPARISON:  12/17/2016.  CT 12/08/2016. FINDINGS: Right PICC line noted with tip projected over the right atrium. Cardiomegaly with pulmonary vascular prominence, bilateral interstitial prominence, bilateral pleural effusions. Findings consistent CHF. Interim slight progression from prior exam. Right infrahilar mass again noted. IMPRESSION: 1. Right PICC line in stable position. 2. Congestive heart failure bilateral pulmonary interstitial edema bilateral pleural effusions. Interstitial edema is progressed from prior exam. 3. Right infrahilar mass again noted. Electronically Signed   By: Marcello Moores  Register   On: 12/18/2016 14:00   Dg Chest Port 1 View  Result Date: 12/17/2016 CLINICAL DATA:  Status post bronchoscopy. EXAM: PORTABLE CHEST 1  VIEW COMPARISON:  CT chest 12/08/2016 FINDINGS: A right-sided PICC line is stable. The heart is enlarged. Atherosclerotic changes are present at the aortic arch. Mild pulmonary vascular congestion is present. Right greater than left pleural effusions are noted. Bibasilar airspace disease is associated. IMPRESSION: 1. Cardiomegaly and mild pulmonary vascular congestion. 2. Persistent bilateral pleural effusions, right greater than left. 3. Partial collapse of the right lower lobe in part related to central soft tissue mass. 4. No radiographic evidence for complication. Electronically Signed   By: San Morelle M.D.   On: 12/17/2016 10:08   PCXR RLL atx/volume loss persists   IMAGING/STUDIES: PFT 04/22/12: FVC 1.90 L (67%) FEV1 1.14 L (47%) FEV1/FVC  0.60 FEF 25-75 0.44 L (70%) negative bronchodilator response TLC 4.09 L (75%) RV 100% ERV 30% DLCO uncorrected 63% CT CHEST W/O 12/08/16:  Previously reviewed by me.Endobronchial obstruction with cut off in the right lower lobe and some evidence of obstruction of the right middle lobe bronchus. Subsequent collapse versus consolidation of right lower lobe and associated pleural effusion. Small left pleural effusion as well. Left-sided nodule which is spiculated. Pathologically enlarged subcarinal as well as one precarinal lymph node. Apical predominant emphysematous changes noted. 3/19 bronchoscopy w/ endobronchial FNA of bronchus intermedius mass, brushing of RMSB & EBUS w/ FNA  MICROBIOLOGY: Abscess Culture 3/6:  Enterococcus faecalis  ANTIBIOTICS: Zosyn 3/5 - 3/8 Cipro 3/8 (x1 dose) Flagyl 3/8 - 3/10 Vancomycin 3/9 >>> Invanz 3/10 >>>  ASSESSMENT/PLAN:  Acute appendicitis with perforation and peritoneal abscess (culture + for Enterococcus Faecalis) Intraperitoneal abscess s/p perc drainage 12/14/2016 Plan On invanz, vanc and diflucan per surgical services; day 10/x  Acute on chronic respiratory failure with hypoxia and hypercapnia ([place]) Suspect that this is primarily d/t combination of sedating medications, obesity, untreated OSA, obstructive atelectasis and COPD Plan Dc sedating meds BIPAP-->possibly transition to CPAP prior to dc  Repeat ABG lasix Hope to avoid intubation  COPD (chronic obstructive pulmonary disease) ([place]) w/ AECOPD Plan Cont brovana and budesonide Scheduled steroids  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   Acute encephalopathy in setting of Hypercarbia Underlying anxiety d/o Plan BIPAP Hold sedating meds  Hypertension Plan Cont Norvasc, lopressor & Cozaar Add lasix IV x1 PRN apresoline  Obesity Plan Nutritional support   Anemia of critical disease.  Plan Trend cbc Transfuse per protocol   Discussion  Had been  fairly anxious. Not sure if this was because her breathing was worse OR this was just anxiety in general. Suspect that this is multifactoral: sedating beds, post-obstructive atx, COPD, OSA. Will see if we can get her by w/ holding sedation and BIPAP but high risk she will require intubation. Of note her path is still pending.   My critical care time 36 minutes  Erick Colace ACNP-BC [place] Pager # [phone] OR # [phone] if no answer

## 2016-12-18 NOTE — Progress Notes (Signed)
White Ring with white stones on left 4th finger cut off with ring cutter from ED. A piece of the band broke off once ring was cut into and slightly pulled apart. Handed to son at bedside with patient's sister in room also. Finger was extremely edematous above ring preventing this RN from removing it with lubricant.

## 2016-12-18 NOTE — Progress Notes (Addendum)
RT called to see patient about respiratory distress. Patient was obtunded.Patient given '5mg'$  albuterol and 0.5 of atrovent for inspiratory and expiratory wheezing. Patient placed on NRB due to O2 sats of 84% on 8 L. Surgery called and ABG obtained (results in chart). Alisha Dom, NP with CCM came to room to see patient with Alisha Regal, PA-C. Patient is being moved to ICU for BiPAP. RT will continue to monitor patient.

## 2016-12-18 NOTE — Progress Notes (Signed)
Rapid Response Event Note  Overview: called to room at 1400 for altered mental status. When arrived to room, patient's RN, Respiratory Therapy, PCCM and CCS at bedside.       Initial Focused Assessment: Patient responsive to voice and follows commands. Initially, inspiratory and expiratory wheezes, then diminished bilaterally. Bilateral arms red.  Patient already on NRB (15L) when arrived in room. ABG already drawn. Romazicon previously given prior to arrival to unit.    See VS flowsheet for VS.    Interventions: VS, remained in room to assist with patient transport/ monitoring to ICU.   Plan of Care (if not transferred): transfer to room 1226.          Jaynie Bream

## 2016-12-18 NOTE — Progress Notes (Signed)
Patient transferred to ICU.

## 2016-12-18 NOTE — Progress Notes (Signed)
Called shortly after leaving the floor.  O2 sats are down.  Placed on face mask  I ask CCM, to see.  I ordered CXR and blood gas. She got xanax this AM and then some 1 mg IV ativan at 1300.   PH 7.142 PCO2 116 PO2 85 HCO3 38  BP was up but we held hydralazine.    Transfering to ICU  Rx with Romazicon by CCM.  Appreciate CCM assistance.

## 2016-12-18 NOTE — Progress Notes (Signed)
Called RT at around 1315 to check patient,having SOB. Patient was lethargic. RT notified.Nebs given. BP was high at 193/120. Rechecked it after 5 minutes came up to 198/84. Salvadore Dom and Earnstine Regal came and assessed patients condition. Also rapid response nurse checked patient. Transfer order to ICU was carried out.

## 2016-12-18 NOTE — Progress Notes (Signed)
Flumazenil 0.5 mg given earlier by Salvadore Dom.

## 2016-12-18 NOTE — Progress Notes (Signed)
Report given to [facility] in ICU.

## 2016-12-18 NOTE — Care Management Important Message (Signed)
Important Message  Patient Details  Name: Alisha Fisher MRN: [id number] Date of Birth: November 20, 1945   Medicare Important Message Given:  Yes    Kerin Salen 12/18/2016, 10:01 AMImportant Message  Patient Details  Name: Alisha Fisher MRN: [id number] Date of Birth: 01-27-1946   Medicare Important Message Given:  Yes    Kerin Salen 12/18/2016, 10:01 AM

## 2016-12-19 ENCOUNTER — Inpatient Hospital Stay (HOSPITAL_COMMUNITY): Payer: Medicare Other

## 2016-12-19 ENCOUNTER — Encounter (HOSPITAL_COMMUNITY): Payer: Self-pay | Admitting: Pulmonary Disease

## 2016-12-19 DIAGNOSIS — C7800 Secondary malignant neoplasm of unspecified lung: Secondary | ICD-10-CM

## 2016-12-19 DIAGNOSIS — R109 Unspecified abdominal pain: Secondary | ICD-10-CM

## 2016-12-19 LAB — COMPREHENSIVE METABOLIC PANEL
ALT: 11 U/L — ABNORMAL LOW (ref 14–54)
AST: 13 U/L — AB (ref 15–41)
Albumin: 2.1 g/dL — ABNORMAL LOW (ref 3.5–5.0)
Alkaline Phosphatase: 63 U/L (ref 38–126)
Anion gap: 5 (ref 5–15)
BILIRUBIN TOTAL: 0.5 mg/dL (ref 0.3–1.2)
BUN: 14 mg/dL (ref 6–20)
CALCIUM: 7.4 mg/dL — AB (ref 8.9–10.3)
CO2: 35 mmol/L — ABNORMAL HIGH (ref 22–32)
Chloride: 100 mmol/L — ABNORMAL LOW (ref 101–111)
Creatinine, Ser: 0.63 mg/dL (ref 0.44–1.00)
GFR calc Af Amer: >60 mL/min (ref >60)
Glucose, Bld: 109 mg/dL — ABNORMAL HIGH (ref 65–99)
POTASSIUM: 3.8 mmol/L (ref 3.5–5.1)
Sodium: 140 mmol/L (ref 135–145)
TOTAL PROTEIN: 5.1 g/dL — AB (ref 6.5–8.1)

## 2016-12-19 LAB — CBC
HEMATOCRIT: 31.6 % — AB (ref 36.0–46.0)
Hemoglobin: 10.1 g/dL — ABNORMAL LOW (ref 12.0–15.0)
MCH: 31 pg (ref 26.0–34.0)
MCHC: 32 g/dL (ref 30.0–36.0)
MCV: 96.9 fL (ref 78.0–100.0)
Platelets: 229 10*3/uL (ref 150–400)
RBC: 3.26 MIL/uL — ABNORMAL LOW (ref 3.87–5.11)
RDW: 14.6 % (ref 11.5–15.5)
WBC: 10.1 10*3/uL (ref 4.0–10.5)

## 2016-12-19 LAB — PROCALCITONIN: Procalcitonin: 0.21 ng/mL

## 2016-12-19 LAB — CREATININE, SERUM
CREATININE: 0.68 mg/dL (ref 0.44–1.00)
GFR calc Af Amer: >60 mL/min (ref >60)
GFR calc non Af Amer: >60 mL/min (ref >60)

## 2016-12-19 LAB — PREALBUMIN: PREALBUMIN: 14.7 mg/dL — AB (ref 18–38)

## 2016-12-19 LAB — MRSA PCR SCREENING: MRSA by PCR: NEGATIVE

## 2016-12-19 MED ORDER — PROMETHAZINE HCL 25 MG/ML IJ SOLN
6.2500 mg | INTRAMUSCULAR | Status: DC | PRN
  Administered 2016-12-21: 6.25 mg via INTRAVENOUS
  Filled 2016-12-19: qty 1

## 2016-12-19 MED ORDER — MIDAZOLAM HCL 2 MG/2ML IJ SOLN: 0.5000 mg | Freq: Once | INTRAMUSCULAR | Status: DC | PRN

## 2016-12-19 MED ORDER — METHYLPREDNISOLONE SODIUM SUCC 125 MG IJ SOLR
60.0000 mg | Freq: Two times a day (BID) | INTRAMUSCULAR | Status: DC
  Administered 2016-12-19 – 2016-12-20 (×2): 60 mg via INTRAVENOUS
  Filled 2016-12-19 (×2): qty 2

## 2016-12-19 MED ORDER — MEPERIDINE HCL 25 MG/ML IJ SOLN: 6.2500 mg | INTRAMUSCULAR | Status: DC | PRN

## 2016-12-19 NOTE — Progress Notes (Signed)
Physical Therapy Treatment Patient Details Name: Alisha Fisher MRN: [id number] DOB: 1945-10-25 Today's Date: 12/19/2016    History of Present Illness Alisha Fisher is a 71 y.o. female ,former smoker, with history of hypertension, COPD, who presented to ED 12/03/16 with 3 day history of right lower quadrant abdominal pain. Subsequent imaging revealed. Perforated appendicitis     PT Comments    Pt cooperative but increased time required and limited progress made 2* pt's elevated anxiety level.   Follow Up Recommendations  SNF;Supervision/Assistance - 24 hour     Equipment Recommendations  Rolling walker with 5" wheels    Recommendations for Other Services       Precautions / Restrictions Precautions Precautions: Fall Precaution Comments: Bil drains, on home oxygen, incontinence B/B Restrictions Weight Bearing Restrictions: No    Mobility  Bed Mobility Overal bed mobility: Needs Assistance Bed Mobility: Supine to Sit     Supine to sit: Mod assist;+2 for safety/equipment     General bed mobility comments: mod assist for upeer body (BMI) sit up after much encouragement.  Transfers Overall transfer level: Needs assistance Equipment used: Rolling walker (2 wheeled) Transfers: Sit to/from Stand Sit to Stand: Min assist;+2 physical assistance;+2 safety/equipment;From elevated surface         General transfer comment: cues for safe transition position and use of UEs to self assist  Ambulation/Gait Ambulation/Gait assistance: Min assist;+2 physical assistance;+2 safety/equipment Ambulation Distance (Feet): 5 Feet Assistive device: 2 person hand held assist Gait Pattern/deviations: Step-to pattern;Decreased step length - right;Decreased step length - left;Shuffle Gait velocity: decreased Gait velocity interpretation: Below normal speed for age/gender General Gait Details: Increased time and elevated anxiety   Stairs            Wheelchair Mobility    Modified  Rankin (Stroke Patients Only)       Balance Overall balance assessment: Needs assistance Sitting-balance support: No upper extremity supported;Feet supported Sitting balance-Leahy Scale: Fair     Standing balance support: Bilateral upper extremity supported Standing balance-Leahy Scale: Poor                      Cognition Arousal/Alertness: Awake/alert Behavior During Therapy: Anxious Overall Cognitive Status: Within Functional Limits for tasks assessed                      Exercises      General Comments        Pertinent Vitals/Pain Pain Assessment: Faces Faces Pain Scale: Hurts little more Pain Location: all over Pain Descriptors / Indicators: Discomfort;Grimacing Pain Intervention(s): Limited activity within patient's tolerance;Monitored during session    Home Living                      Prior Function            PT Goals (current goals can now be found in the care plan section) Acute Rehab PT Goals Patient Stated Goal: to feel better. PT Goal Formulation: With patient Time For Goal Achievement: 12/20/16 Potential to Achieve Goals: Good Progress towards PT goals: Not progressing toward goals - comment (Decline in status and tranferred to ICU)    Frequency    Min 3X/week      PT Plan Current plan remains appropriate    Co-evaluation             End of Session Equipment Utilized During Treatment: Oxygen Activity Tolerance: Patient limited by fatigue;Other (comment) (anxiety) Patient left: in chair;with call  bell/phone within reach;with chair alarm set Nurse Communication: Mobility status PT Visit Diagnosis: Pain;Unsteadiness on feet (R26.81) Pain - Right/Left: Right Pain - part of body:  (abdomen)     Time: 2694-8546 PT Time Calculation (min) (ACUTE ONLY): 26 min  Charges:  $Gait Training: 8-22 mins $Therapeutic Activity: 8-22 mins                    G Codes:       Alisha Fisher 12-28-2016, 5:03 PM

## 2016-12-19 NOTE — Consult Note (Signed)
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 Abdominal Pain  : I was told that I have lung cancer.  HPI: Alisha Fisher is a very charming 71 year old white female. She does note a past history of smoking. She basically smoked up until about a week or so ago. She probably has a 45-50-pack-year history of tobacco use. She actually is hospitalized because of a ruptured appendix with subsequent peritonitis.  She has been having intermittent episodes of dyspnea. She's having some chest discomfort.  She has had past x-rays and scans.  She ultimately had a CT scan on March 10. This showed new bilateral axillary adenopathy. She has mild right paratracheal adenopathy. No mediastinal lymph nodes are noted. She has stable soft tissue density occlusion of the right lower lobe bronchus that is suspicious for right lower lobe lung mass. The right middle lobe bronchus appears occluded. There is noted to be a 2.1 x 1.4 cm solid medial left upper lobe pulmonary nodule. She has some mild to moderate compressive atelectasis.  She was taken to the operating room for a video bronchoscopy. She had no endobronchial lesions on the left. A mass was obstructing the bronchus intermedius on the right. The mucosal irregularity extending into the right mainstem bronchus to the level of the carina. Also noted was abdomen allergies in the right upper lobe bronchus. Multiple biopsies were done. 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  She is still in the ICU. She is recovering from this peritonitis.  Her labs today show a prealbumin of 14.7. Calcium 7.4. Her white cell count 7.1. Hemoglobin 10.1. Platelet count 229,000.  She does seem to be in fairly decent shape. She is a large woman.  She's had no hemoptysis. She's had some chronic shortness of breath.  She is having some clear liquids right  now.  I would have to say that her overall performance status is ECOG 1-2.    Past Medical History:  Diagnosis Date  . COPD, severe ([place])   . HTN (hypertension)   . Pneumonia   . Pulmonary emphysema ([place])   . SOB (shortness of breath)   :  Past Surgical History:  Procedure Laterality Date  . CATARACT EXTRACTION    :   Current Facility-Administered Medications:  .  0.9 %  sodium chloride infusion, 250 mL, Intravenous, PRN, Michael Boston, MD .  0.9 %  sodium chloride infusion, , Intravenous, Once, Javier Glazier, MD .  acetaminophen (TYLENOL) tablet 650 mg, 650 mg, Oral, Q6H PRN, Jerrye Beavers, PA-C, 650 mg at 12/19/16 1059 .  albuterol (PROVENTIL) (2.5 MG/3ML) 0.083% nebulizer solution 2.5 mg, 2.5 mg, Nebulization, Q6H PRN, Johnathan Hausen, MD, 2.5 mg at 12/18/16 1319 .  amLODipine (NORVASC) tablet 10 mg, 10 mg, Oral, Daily, Michael Boston, MD, 10 mg at 12/19/16 0957 .  arformoterol (BROVANA) nebulizer solution 15 mcg, 15 mcg, Nebulization, Q12H, Erick Colace, NP, 15 mcg at 12/19/16 [id number] .  budesonide (PULMICORT) nebulizer solution 0.5 mg, 0.5 mg, Nebulization, Q12H, Erick Colace, NP, 0.5 mg at 12/19/16 0839 .  chlorhexidine (PERIDEX) 0.12 % solution 15 mL, 15 mL, Mouth Rinse, BID, Erick Colace, NP, 15 mL at 12/19/16 1000 .  cholecalciferol (VITAMIN D) tablet 1,000 Units, 1,000 Units, Oral, Daily, Michael Boston, MD, 1,000 Units at 12/19/16 0957 .  citalopram (CELEXA) tablet 10 mg, 10 mg, Oral, Daily, Michael Boston, MD,  10 mg at 12/19/16 0957 .  enoxaparin (LOVENOX) injection 40 mg, 40 mg, Subcutaneous, Q24H, Earnstine Regal, PA-C, 40 mg at 12/19/16 1000 .  ertapenem (INVANZ) 1 g in sodium chloride 0.9 % 50 mL IVPB, 1 g, Intravenous, Q24H, Autumn Messing III, MD, 1 g at 12/19/16 1709 .  fluconazole (DIFLUCAN) tablet 200 mg, 200 mg, Oral, Daily, Michael Boston, MD, 200 mg at 12/19/16 0956 .  furosemide (LASIX) injection 40 mg, 40 mg, Intravenous, Q12H, Praveen Mannam, MD, 40 mg at  12/19/16 1708 .  hydrALAZINE (APRESOLINE) injection 5-20 mg, 5-20 mg, Intravenous, Q6H PRN, Michael Boston, MD, 5 mg at 12/17/16 1109 .  losartan (COZAAR) tablet 100 mg, 100 mg, Oral, Daily, 100 mg at 12/19/16 0957 **AND** hydrochlorothiazide (HYDRODIURIL) tablet 25 mg, 25 mg, Oral, Daily, Berton Mount, RPH, 25 mg at 12/19/16 0957 .  iopamidol (ISOVUE-300) 61 % injection 15 mL, 15 mL, Oral, BID PRN, Jerrye Beavers, PA-C, 15 mL at 12/13/16 0653 .  iopamidol (ISOVUE-300) 61 % injection 15 mL, 15 mL, Oral, Once PRN, Jerrye Beavers, PA-C .  ipratropium-albuterol (DUONEB) 0.5-2.5 (3) MG/3ML nebulizer solution 3 mL, 3 mL, Nebulization, TID, Johnathan Hausen, MD, 3 mL at 12/19/16 1343 .  lip balm (CARMEX) ointment 1 application, 1 application, Topical, BID, Michael Boston, MD, 1 application at 85/02/77 1000 .  magic mouthwash, 15 mL, Oral, QID PRN, Michael Boston, MD .  MEDLINE mouth rinse, 15 mL, Mouth Rinse, q12n4p, Erick Colace, NP, 15 mL at 12/19/16 1600 .  meperidine (DEMEROL) injection 6.25-12.5 mg, 6.25-12.5 mg, Intravenous, Q5 min PRN, Annye Asa, MD .  methylPREDNISolone sodium succinate (SOLU-MEDROL) 125 mg/2 mL injection 60 mg, 60 mg, Intravenous, Q12H, Praveen Mannam, MD, 60 mg at 12/19/16 1708 .  metoprolol (LOPRESSOR) injection 5 mg, 5 mg, Intravenous, Q6H PRN, Michael Boston, MD .  metoprolol tartrate (LOPRESSOR) tablet 150 mg, 150 mg, Oral, Daily, Johnathan Hausen, MD, 150 mg at 12/19/16 0956 .  midazolam (VERSED) injection 0.5-2 mg, 0.5-2 mg, Intravenous, Once PRN, Annye Asa, MD .  ondansetron (ZOFRAN-ODT) disintegrating tablet 4 mg, 4 mg, Oral, Q6H PRN **OR** ondansetron (ZOFRAN) injection 4 mg, 4 mg, Intravenous, Q6H PRN, Johnathan Hausen, MD, 4 mg at 12/19/16 1541 .  pantoprazole (PROTONIX) EC tablet 40 mg, 40 mg, Oral, Q1200, Michael Boston, MD, 40 mg at 12/19/16 1200 .  polyethylene glycol (MIRALAX / GLYCOLAX) packet 17 g, 17 g, Oral, Daily, Brooke A Miller, PA-C, 17 g at  12/19/16 1000 .  promethazine (PHENERGAN) injection 6.25-12.5 mg, 6.25-12.5 mg, Intravenous, Q15 min PRN, Annye Asa, MD .  saccharomyces boulardii [facility]) capsule 250 mg, 250 mg, Oral, BID, Michael Boston, MD, 250 mg at 12/19/16 0957 .  sodium chloride flush (NS) 0.9 % injection 10-40 mL, 10-40 mL, Intracatheter, PRN, Jerrye Beavers, PA-C, 10 mL at 12/14/16 0414 .  sodium chloride flush (NS) 0.9 % injection 10-40 mL, 10-40 mL, Intracatheter, PRN, Saverio Danker, PA-C .  sodium chloride flush (NS) 0.9 % injection 3 mL, 3 mL, Intravenous, Q12H, Michael Boston, MD, 3 mL at 12/19/16 1000 .  sodium chloride flush (NS) 0.9 % injection 3 mL, 3 mL, Intravenous, PRN, Michael Boston, MD, 3 mL at 12/18/16 1005 .  vancomycin (VANCOCIN) IVPB 1000 mg/200 mL premix, 1,000 mg, Intravenous, Q12H, Javier Glazier, MD, 1,000 mg at 12/19/16 1200:  . sodium chloride   Intravenous Once  . amLODipine  10 mg Oral Daily  . arformoterol  15 mcg Nebulization Q12H  . budesonide (PULMICORT)  nebulizer solution  0.5 mg Nebulization Q12H  . chlorhexidine  15 mL Mouth Rinse BID  . cholecalciferol  1,000 Units Oral Daily  . citalopram  10 mg Oral Daily  . enoxaparin (LOVENOX) injection  40 mg Subcutaneous Q24H  . ertapenem  1 g Intravenous Q24H  . fluconazole  200 mg Oral Daily  . furosemide  40 mg Intravenous Q12H  . losartan  100 mg Oral Daily   And  . hydrochlorothiazide  25 mg Oral Daily  . ipratropium-albuterol  3 mL Nebulization TID  . lip balm  1 application Topical BID  . mouth rinse  15 mL Mouth Rinse q12n4p  . methylPREDNISolone (SOLU-MEDROL) injection  60 mg Intravenous Q12H  . metoprolol  150 mg Oral Daily  . pantoprazole  40 mg Oral Q1200  . polyethylene glycol  17 g Oral Daily  . saccharomyces boulardii  250 mg Oral BID  . sodium chloride flush  3 mL Intravenous Q12H  . vancomycin  1,000 mg Intravenous Q12H  :  Allergies  Allergen Reactions  . Ciprofloxacin Rash    Rash possibly caused by  Cipro?  . Flagyl [Metronidazole] Rash    Rash possibly caused by Flagyl?  Marland Kitchen Zosyn [Piperacillin Sod-Tazobactam So] Itching and Rash    Has patient had a PCN reaction causing immediate rash, facial/tongue/throat swelling, SOB or lightheadedness with hypotension: No Has patient had a PCN reaction causing severe rash involving mucus membranes or skin necrosis: No Has patient had a PCN reaction that required hospitalization: No Has patient had a PCN reaction occurring within the last 10 years: Yes If all of the above answers are "NO", then may proceed with Cephalosporin use.   :  Family History  Problem Relation Age of Onset  . Allergies    . COPD Cousin   . Breast cancer Mother   . Diabetes Father   . Diabetes Son   :  Social History   Social History  . Marital status: Widowed    Spouse name: N/A  . Number of children: N/A  . Years of education: N/A   Occupational History  . retired    Social History Main Topics  . Smoking status: Former Smoker    Packs/day: 0.50    Years: 50.00    Types: Cigarettes    Quit date: 11/01/2016  . Smokeless tobacco: Never Used  . Alcohol use No  . Drug use: No  . Sexual activity: Not on file   Other Topics Concern  . Not on file   Social History Narrative   [facility] (12/10/16):   Patient's widower son and grandchildren moved in with her. Her husband passed years ago. Previously worked in [employer].  :  Pertinent items are noted in HPI.  Exam: Patient Vitals for the past 24 hrs:  BP Temp Temp src Pulse Resp SpO2 Weight  12/19/16 1700 (!) 157/66 - - - 17 95 % -  12/19/16 1600 (!) 159/63 - - - (!) 23 91 % -  12/19/16 1500 (!) 165/60 - - - 20 91 % -  12/19/16 1400 (!) 168/51 - - - (!) 22 (!) 89 % -  12/19/16 1344 - - - - - 91 % -  12/19/16 1300 (!) 168/56 - - - (!) 21 (!) 89 % -  12/19/16 1200 (!) 164/54 99.1 F (37.3 C) Core - (!) 23 91 % -  12/19/16 1100 (!) 175/60 - - - (!) 21 96 % -  12/19/16 1000 (!) 176/62 - - -  20 93 % -   12/19/16 0915 - - - - - 96 % -  12/19/16 0900 (!) 166/60 - - - 19 97 % -  12/19/16 0847 - - - - - 100 % -  12/19/16 0800 (!) 163/67 - - - (!) 24 100 % -  12/19/16 0600 (!) 170/61 - - - 16 98 % -  12/19/16 0500 (!) 156/58 - - - 16 96 % 278 lb (126.1 kg)  12/19/16 0400 (!) 160/68 98.4 F (36.9 C) Core - (!) 21 98 % -  12/19/16 0350 (!) 154/52 - - 75 (!) 22 98 % -  12/19/16 0300 (!) 154/52 - - - 20 95 % -  12/19/16 0200 (!) 144/62 - - - 20 97 % -  12/19/16 0100 (!) 147/60 - - - 20 97 % -  12/19/16 0000 (!) 143/58 98.1 F (36.7 C) Core - 17 98 % -  12/18/16 2339 - - - 73 (!) 21 98 % -  12/18/16 2300 (!) 132/55 - - - (!) 22 98 % -  12/18/16 2200 (!) 134/55 - - - (!) 21 98 % -  12/18/16 2100 (!) 131/54 - - - 20 98 % -  12/18/16 2000 (!) 147/58 97.2 F (36.2 C) Core - 20 99 % -  12/18/16 1951 - - - - - 99 % -  12/18/16 1950 - - - - - 98 % -  12/18/16 1945 - - - 69 (!) 22 98 % -  12/18/16 1800 (!) 131/51 (!) 95.7 F (35.4 C) Core - 17 99 % -    As above    Recent Labs  12/17/16 0450 12/19/16 1006  WBC 13.0* 10.1  HGB 11.3* 10.1*  HCT 34.7* 31.6*  PLT 336 229    Recent Labs  12/18/16 0458 12/19/16 0358 12/19/16 1006  NA 139  --  140  K 4.3  --  3.8  CL 98*  --  100*  CO2 36*  --  35*  GLUCOSE 120*  --  109*  BUN 11  --  14  CREATININE 0.70 0.68 0.63  CALCIUM 8.4*  --  7.4*    Blood smear review:  None  Pathology: See path reports     Assessment and Plan:  Ms. Amrhein is a 71 year old white female. 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 She at see me have metastatic disease. She has bilateral axillary adenopathy.  Obviously, the main problem is that she has this obstruction. She is at high-risk for significant postobstructive pneumonia and I think this would be very difficult for her to get over.  I think that she needs to have local therapy to the chest to help with this obstruction.  I would recommend that  radiation oncology be consulted for radiation therapy. Whether or not she would tolerate combination radiation and chemotherapy is not clear right now. There is absolutely no way that she can have chemotherapy if she is dealing with active peritonitis.  Regardless of whether or not she has peritonitis, she could start radiation therapy to the chest to try to help with this endobronchial obstruction.  It may not be a bad idea to see if radiology can biopsy one or both axilla to see her she does have metastatic disease.  Unfortunately, since she is an inpatient, we cannot do a PET scan on her.  I have to believe that we are dealing with stage IV  disease. At some point, she will probably need systemic therapy. However, the present problem is this obstructive lesion. As such, I would recommend radiation oncology get involved.   Tomorrow is her birthday. I feel bad that she is in the hospital for this.  However, I'm sure that the ICU staff will somehow do something for her to help celebrate her birthday.  I tried to answer her questions. Again she is very nice. She has a strong faith.  Lattie Haw, MD  Oswaldo Milian 41:10

## 2016-12-19 NOTE — Progress Notes (Signed)
[facility] Physician Requesting Consult:  Alisha Fisher, M.D. / CCS  Date of Consult:  12/10/2016  Reason for Consult/Chief Complaint:  Right Lung Endobronchial Mass  History of Presenting Illness:  71 y.o. With a history of emphysema and essential hypertension admitted in February 2010 at outside hospital. She was found to have what appeared to be an endobronchial mass causing subsequent collapse with in her right lower lobe. She was evaluated by pulmonary at outside hospital and the plan was for the patient to undergo bronchoscopy on 3/7. Patient was admitted on 3/5 to our facility with a ruptured appendix and associated peritonitis/abscess. Patient reports that her abdominal pain seems to be well-controlled but she does continue to have intermittent dyspnea. She also endorses intermittent nausea. She denies any chronic chest discomfort but did experience a "heaviness" in her chest yesterday that spontaneously resolved. She denies any dysphagia or odynophagia. She denies any headache or focal vision changes. She does endorse some intermittent left thigh numbness but no other focal weakness or tingling. She denies any subjective chills or sweats. She denies any weight loss. Of note on the patient was on Zosyn she developed a rash over her whole body as well as some sloughing of her oral mucous membranes. She denies any abnormal bruising. She does report that she has had some dyspnea since 2013 approximately.  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 Transferred to the ICU 3/20 for acute hypercarbic respiratory failure requiring BiPAP She is much more awake today, alert, oriented and in no respiratory distress. She has been taken off BiPAP and is tolerating nasal cannula well.   Objective Temp:  [94.9 F (34.9 C)-98.4 F (36.9 C)] 98.4 F (36.9 C) (03/21 0400) Pulse Rate:  [69-86] 75 (03/21 0350) Resp:  [16-25] 16 (03/21 0600) BP:  (118-212)/(40-84) 170/61 (03/21 0600) SpO2:  [86 %-100 %] 96 % (03/21 0915) FiO2 (%):  [50 %-100 %] 50 % (03/21 0400) Weight:  [276 lb 10.8 oz (125.5 kg)-278 lb (126.1 kg)] 278 lb (126.1 kg) (03/21 0500)   Blood pressure (!) 170/61, pulse 75, temperature 98.4 F (36.9 C), temperature source Core (Comment), resp. rate 16, height '5\' 7"'$  (1.702 m), weight 278 lb (126.1 kg), SpO2 96 %. Gen:      No acute distress HEENT:  EOMI, sclera anicteric Neck:     No masses; no thyromegaly Lungs:    Clear to auscultation bilaterally; normal respiratory effort CV:         Regular rate and rhythm; no murmurs Abd:      + bowel sounds; soft, non-tender; no palpable masses, no distension Ext:    No edema; adequate peripheral perfusion Skin:      Warm and dry; no rash Neuro: alert and oriented x 3 Psych: normal mood and affect  CBC Recent Labs     12/17/16  0450  WBC  13.0*  HGB  11.3*  HCT  34.7*  PLT  336    Coag's No results for input(s): APTT, INR in the last 72 hours.  BMET Recent Labs     12/17/16  0450  12/18/16  0458  12/19/16  0358  NA  140  139   --   K  4.3  4.3   --   CL  101  98*   --   CO2  34*  36*   --   BUN  9  11   --   CREATININE  0.61  0.70  0.68  GLUCOSE  95  120*   --     Electrolytes Recent Labs     12/17/16  0450  12/18/16  0458  CALCIUM  8.1*  8.4*  MG  1.9   --     Sepsis Markers No results for input(s): PROCALCITON, O2SATVEN in the last 72 hours.  Invalid input(s): LACTICACIDVEN  ABG Recent Labs     12/18/16  1330  12/18/16  1610  PHART  7.142*  7.217*  PCO2ART  116*  92.6*  PO2ART  85.7  95.8    Liver Enzymes No results for input(s): AST, ALT, ALKPHOS, BILITOT, ALBUMIN in the last 72 hours.  Cardiac Enzymes No results for input(s): TROPONINI, PROBNP in the last 72 hours.  Glucose Recent Labs     12/16/16  1209  12/16/16  1658  12/16/16  2009  12/17/16  0008  12/17/16  0447  GLUCAP  100*  118*  221*  141*  77    Imaging Dg  Chest Port 1 View  Result Date: 12/19/2016 CLINICAL DATA:  Respiratory distress EXAM: PORTABLE CHEST 1 VIEW COMPARISON:  December 18, 2016 FINDINGS: Central catheter tip is in the superior vena cava. No pneumothorax. There is airspace consolidation in each lower lobe with small pleural effusions bilaterally. There is cardiomegaly with mild pulmonary venous hypertension. No adenopathy. There is atherosclerotic calcification in the aorta. No bone lesions. IMPRESSION: Central catheter tip in superior vena cava. No pneumothorax. Findings indicative of a degree of congestive heart failure. Superimposed bibasilar pneumonia cannot be excluded radiographically. Pneumonia and alveolar edema may present concurrently. There is aortic atherosclerosis. Electronically Signed   By: Lowella Grip III M.D.   On: 12/19/2016 08:56   Dg Chest Port 1 View  Result Date: 12/18/2016 CLINICAL DATA:  Low oxygen saturation. EXAM: PORTABLE CHEST 1 VIEW COMPARISON:  12/17/2016.  CT 12/08/2016. FINDINGS: Right PICC line noted with tip projected over the right atrium. Cardiomegaly with pulmonary vascular prominence, bilateral interstitial prominence, bilateral pleural effusions. Findings consistent CHF. Interim slight progression from prior exam. Right infrahilar mass again noted. IMPRESSION: 1. Right PICC line in stable position. 2. Congestive heart failure bilateral pulmonary interstitial edema bilateral pleural effusions. Interstitial edema is progressed from prior exam. 3. Right infrahilar mass again noted. Electronically Signed   By: Marcello Moores  Register   On: 12/18/2016 14:00   Dg Chest Port 1 View  Result Date: 12/17/2016 CLINICAL DATA:  Status post bronchoscopy. EXAM: PORTABLE CHEST 1 VIEW COMPARISON:  CT chest 12/08/2016 FINDINGS: A right-sided PICC line is stable. The heart is enlarged. Atherosclerotic changes are present at the aortic arch. Mild pulmonary vascular congestion is present. Right greater than left pleural effusions  are noted. Bibasilar airspace disease is associated. IMPRESSION: 1. Cardiomegaly and mild pulmonary vascular congestion. 2. Persistent bilateral pleural effusions, right greater than left. 3. Partial collapse of the right lower lobe in part related to central soft tissue mass. 4. No radiographic evidence for complication. Electronically Signed   By: San Morelle M.D.   On: 12/17/2016 10:08    IMAGING/STUDIES: PFT 04/22/12: FVC 1.90 L (67%) FEV1 1.14 L (47%) FEV1/FVC 0.60 FEF 25-75 0.44 L (70%) negative bronchodilator response TLC 4.09 L (75%) RV 100% ERV 30% DLCO uncorrected 63%  CT CHEST W/O 12/08/16:  Endobronchial obstruction with cut off in the right lower lobe and some evidence of obstruction of the right middle lobe bronchus. Subsequent collapse versus consolidation of right lower lobe and associated pleural effusion. Small left  pleural effusion as well. Left-sided nodule which is spiculated. Pathologically enlarged subcarinal as well as one precarinal lymph node. Apical predominant emphysematous changes noted. 3/19 bronchoscopy w/ endobronchial FNA of bronchus intermedius mass, brushing of RMSB & EBUS w/ FNA  CXR 3/20 > persistent lung mass, worsening opacities concerning for pulmonary edema CXR 3/21> persistent lung mass, slightly improved opacities. I have reviewed all images personally.  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 Obesity  Probable OSA.  Tobacco abuse  Decompensation yesterday is likely due to combination of lung collapse, post obstructive PNA, sedating meds, pulmonary edema She is doing better today  - Use bipap as needed during the day and mandatory at night - Continue lasix for diuresis - Avoid sedating meds - Continue abx for now. We will check a Pct  to determine the duration of therapy for post obstructive PNA. As per surgery she does not need any more days of antibiotics for pelvic infection. - Oncology consulted - Continue nebs. Reduce steroids to solumedrol 60 gm q12  I informed the patient today about the diagnosis of cancer. She requested that I talk to her sister as well.   Acute appendicitis with perforation and peritoneal abscess (culture + for Enterococcus Faecalis) Intraperitoneal abscess s/p perc drainage 12/14/2016 - Management per surgery  Hypertension -Cont Norvasc, lopressor & Cozaar - Lasix for diuresis  Marshell Garfinkel MD [facility] Pager [phone] If no answer or after 3pm call: [phone] 12/19/2016, 9:22 AM

## 2016-12-19 NOTE — Progress Notes (Signed)
2 Days Post-Op  Subjective: On BIPAP this AM.  Moving air well this AM with BiPAP.  Asking about ice chips.  She is alert and trying to communicate with BiPAP on. Numbers all look good this AM on monitor.    Objective: Vital signs in last 24 hours: Temp:  [94.9 F (34.9 C)-98.4 F (36.9 C)] 98.4 F (36.9 C) (03/21 0400) Pulse Rate:  [69-86] 75 (03/21 0350) Resp:  [16-25] 16 (03/21 0600) BP: (118-212)/(40-84) 170/61 (03/21 0600) SpO2:  [86 %-100 %] 98 % (03/21 0600) FiO2 (%):  [50 %-100 %] 50 % (03/21 0400) Weight:  [125.5 kg (276 lb 10.8 oz)-126.1 kg (278 lb)] 126.1 kg (278 lb) (03/21 0500) Last BM Date: 12/18/16 Nothing PO 200 IV recorded Urine 1850 Drains 10 Stool x1 Afebrile, VSS  Weight up to 125 KG form 102 listed on admit I have ordered labs for today CXR yesterday:  Congestive heart failure bilateral pulmonary interstitial edema bilateral pleural effusions. Interstitial edema is progressed from prior exam.  Right infrahilar mass again noted.  CXR ordered for this AM.  Intake/Output from previous day: 03/20 0701 - 03/21 0700 In: 213 [I.V.:3; IV Piggyback:200] Out: 1860 [Urine:1850; Drains:10] Intake/Output this shift: No intake/output data recorded.  General appearance: alert, cooperative and She is on BIPAP Resp: clear to auscultation bilaterally and anterior exam.  hard to get her up but she was moving air and clear posterior right side.   GI: she is mildly distended, very large abdomen.  Drain on right still has feculent materail in it.  Drain on left is serosanguinous and clear.   Rash stable- generalized anasarca. Lab Results:   Recent Labs  12/17/16 0450  WBC 13.0*  HGB 11.3*  HCT 34.7*  PLT 336    BMET  Recent Labs  12/17/16 0450 12/18/16 0458 12/19/16 0358  NA 140 139  --   K 4.3 4.3  --   CL 101 98*  --   CO2 34* 36*  --   GLUCOSE 95 120*  --   BUN 9 11  --   CREATININE 0.61 0.70 0.68  CALCIUM 8.1* 8.4*  --    PT/INR No results for  input(s): LABPROT, INR in the last 72 hours.   Recent Labs Lab 12/15/16 0452  AST 10*  ALT 9*  ALKPHOS 57  BILITOT 0.8  PROT 4.8*  ALBUMIN 1.9*     Lipase     Component Value Date/Time   LIPASE 15 12/03/2016 1613     Studies/Results: Dg Chest Port 1 View  Result Date: 12/18/2016 CLINICAL DATA:  Low oxygen saturation. EXAM: PORTABLE CHEST 1 VIEW COMPARISON:  12/17/2016.  CT 12/08/2016. FINDINGS: Right PICC line noted with tip projected over the right atrium. Cardiomegaly with pulmonary vascular prominence, bilateral interstitial prominence, bilateral pleural effusions. Findings consistent CHF. Interim slight progression from prior exam. Right infrahilar mass again noted. IMPRESSION: 1. Right PICC line in stable position. 2. Congestive heart failure bilateral pulmonary interstitial edema bilateral pleural effusions. Interstitial edema is progressed from prior exam. 3. Right infrahilar mass again noted. Electronically Signed   By: Marcello Moores  Register   On: 12/18/2016 14:00   Dg Chest Port 1 View  Result Date: 12/17/2016 CLINICAL DATA:  Status post bronchoscopy. EXAM: PORTABLE CHEST 1 VIEW COMPARISON:  CT chest 12/08/2016 FINDINGS: A right-sided PICC line is stable. The heart is enlarged. Atherosclerotic changes are present at the aortic arch. Mild pulmonary vascular congestion is present. Right greater than left pleural effusions are noted.  Bibasilar airspace disease is associated. IMPRESSION: 1. Cardiomegaly and mild pulmonary vascular congestion. 2. Persistent bilateral pleural effusions, right greater than left. 3. Partial collapse of the right lower lobe in part related to central soft tissue mass. 4. No radiographic evidence for complication. Electronically Signed   By: San Morelle M.D.   On: 12/17/2016 10:08    Medications: . sodium chloride   Intravenous Once  . amLODipine  10 mg Oral Daily  . arformoterol  15 mcg Nebulization Q12H  . budesonide (PULMICORT) nebulizer  solution  0.5 mg Nebulization Q12H  . chlorhexidine  15 mL Mouth Rinse BID  . cholecalciferol  1,000 Units Oral Daily  . citalopram  10 mg Oral Daily  . enoxaparin (LOVENOX) injection  40 mg Subcutaneous Q24H  . ertapenem  1 g Intravenous Q24H  . fluconazole  200 mg Oral Daily  . furosemide  40 mg Intravenous Q12H  . losartan  100 mg Oral Daily   And  . hydrochlorothiazide  25 mg Oral Daily  . ipratropium-albuterol  3 mL Nebulization TID  . lip balm  1 application Topical BID  . mouth rinse  15 mL Mouth Rinse q12n4p  . methylPREDNISolone (SOLU-MEDROL) injection  60 mg Intravenous Q6H  . metoprolol  150 mg Oral Daily  . pantoprazole  40 mg Oral Q1200  . polyethylene glycol  17 g Oral Daily  . potassium chloride  40 mEq Oral BID  . saccharomyces boulardii  250 mg Oral BID  . sodium chloride flush  3 mL Intravenous Q12H  . vancomycin  1,000 mg Intravenous Q12H    Assessment/Plan Perforated appendicitis s/p CT guided pelvic drain placement 12/04/16  s/p CT scan and IR drain placed 12/14/16, left trans gluteal approach  CT chest 12/03/16 =>> RLL mass =>>  Bronchus Intermedius Endobronchial Obstruction S/p  Bronchoscopy with Airway Inspection, Endobronchial Fine Needle Aspiration Bronchus Intermedius Mass  Endobronchial Brushings Right Mainstem Bronchus Endobronchial Ultrasound with Needle Aspiration 12/17/16, DR. Nestor Over sedation on anxiolytics with respiratory distress - Bipap 12/18/16 COPD - on O2 at home Probable OSA Recent pneumonia - earlier this year Hospital rash - Concerns with CIPRO/Flagyl and Zosyn allergies  Anasarca/ very positive fluid balance  - fluid overload  - lasix 40 mg q12h HTN - Former smoker - quit earlier this year RIGHT lower lobe mass - stable from previous CT Body mass index is listed at 35.3  I doubt that is correct - checking weight again today FEN:  No IV fluids/NPO on BIPAP - ice chips for now. ID: day 16 antibiotics Zosyn 12/03/16-12/05/16; Cipro/flagyl  3/8-3/10/18;  Both stopped for rash/possible allergy Invanz 3/10 =>> day 11  Vancomycin 3/09=>> day13  Diflucan 3/18 =>> day 4 She did not get Invanz or diflucan yesterday (CT results 12/13/16:Loculated ascites with peritoneal thickening in the anterior left abdomen. Example 14.5 x 1.8 cm on image 47/series 2. This is similar to decreased from 17.1 x 2.7 cm on the prior. Percutaneous drain within the right lower quadrant. No surrounding fluid collection identified. Cul-de-sac fluid with peritoneal thickening. Collection measures 6.8 x 3.6 cm on image 75/series 2. This is similar in size on the prior exam. The peritoneal thickening is new or increased. contiguous trace left pelvic fluid - IR drain to pelvic fluid collection on 12/14/16) DVT: Lovenox     Plan:  Dr; Redmond Pulling has reviewed the CT's and antibiotics.  From our standpoint we are ready to stop the antibiotics.  We will wait and see what Pulmonary  wants to do.  She is on lasix now for the fluid overload and interstitial fluid on her CXR.  I have ordered another xray for this AM.  Labs are also requested.  We will ask IR to inject the drain on the right with the next CT scan.  Defer to CCM on BiPAP.  She can have ice chips for now with assist.     LOS: 16 days    Josafat Enrico 12/19/2016 [phone]

## 2016-12-19 NOTE — Progress Notes (Signed)
Referring Physician(s): Toth,P  Supervising Physician: Jacqulynn Cadet  Patient Status:  [facility] - In-pt  Chief Complaint:  Pelvic abscesses  Subjective: Patient now in ICU secondary to respiratory difficulties yesterday from combination of pneumonia, sedating meds, CHF/ pulmonary edema; breathing appears improved now ; currently sitting up in chair; still has some pelvic tenderness to palpation, worse by coughing.   Allergies: Ciprofloxacin; Flagyl [metronidazole]; and Zosyn [piperacillin sod-tazobactam so]  Medications: Prior to Admission medications   Medication Sig Start Date End Date Taking? Authorizing Provider  ADVAIR DISKUS 250-50 MCG/DOSE AEPB Inhale 1 puff into the lungs Twice daily. 03/10/12  Yes Historical Provider, MD  albuterol (PROVENTIL HFA;VENTOLIN HFA) 108 (90 Base) MCG/ACT inhaler Inhale 2 puffs into the lungs every 6 (six) hours as needed for wheezing or shortness of breath.   Yes Historical Provider, MD  ALPRAZolam Duanne Moron) 0.5 MG tablet Take 0.5 mg by mouth 3 (three) times daily as needed for anxiety.  11/21/16  Yes Historical Provider, MD  amLODipine (NORVASC) 10 MG tablet Take 10 mg by mouth daily. 09/11/16  Yes Historical Provider, MD  Cholecalciferol (VITAMIN D PO) Take 4,000-5,000 Units by mouth daily.   Yes Historical Provider, MD  citalopram (CELEXA) 10 MG tablet Take 10 mg by mouth daily. 11/21/16  Yes Historical Provider, MD  losartan-hydrochlorothiazide (HYZAAR) 100-25 MG per tablet Take 1 tablet by mouth daily. 05/04/14  Yes Belva Crome, MD  metoprolol (LOPRESSOR) 100 MG tablet Take 1.5 tablets (150 mg total) by mouth daily. 07/14/13  Yes Belva Crome, MD  amLODipine (NORVASC) 5 MG tablet Take 1 tablet (5 mg total) by mouth daily. Patient not taking: Reported on 12/03/2016 07/14/13   Belva Crome, MD     Vital Signs: BP (!) 159/63 (BP Location: Left Arm)   Pulse 75   Temp 99.1 F (37.3 C) (Core (Comment))   Resp (!) 23   Ht '5\' 7"'$  (1.702 m)    Wt 278 lb (126.1 kg)   SpO2 91%   BMI 43.54 kg/m   Physical Exam right lower quadrant and left transgluteal drains intact; feculent appearing material in right lower quadrant drain, serosanguineous fluid in left transgluteal drain. Outputs less than 10 mL each. Sites mildly tender to palpation.  Imaging: Dg Chest Port 1 View  Result Date: 12/19/2016 CLINICAL DATA:  Respiratory distress EXAM: PORTABLE CHEST 1 VIEW COMPARISON:  December 18, 2016 FINDINGS: Central catheter tip is in the superior vena cava. No pneumothorax. There is airspace consolidation in each lower lobe with small pleural effusions bilaterally. There is cardiomegaly with mild pulmonary venous hypertension. No adenopathy. There is atherosclerotic calcification in the aorta. No bone lesions. IMPRESSION: Central catheter tip in superior vena cava. No pneumothorax. Findings indicative of a degree of congestive heart failure. Superimposed bibasilar pneumonia cannot be excluded radiographically. Pneumonia and alveolar edema may present concurrently. There is aortic atherosclerosis. Electronically Signed   By: Lowella Grip III M.D.   On: 12/19/2016 08:56   Dg Chest Port 1 View  Result Date: 12/18/2016 CLINICAL DATA:  Low oxygen saturation. EXAM: PORTABLE CHEST 1 VIEW COMPARISON:  12/17/2016.  CT 12/08/2016. FINDINGS: Right PICC line noted with tip projected over the right atrium. Cardiomegaly with pulmonary vascular prominence, bilateral interstitial prominence, bilateral pleural effusions. Findings consistent CHF. Interim slight progression from prior exam. Right infrahilar mass again noted. IMPRESSION: 1. Right PICC line in stable position. 2. Congestive heart failure bilateral pulmonary interstitial edema bilateral pleural effusions. Interstitial edema is progressed from prior exam.  3. Right infrahilar mass again noted. Electronically Signed   By: Marcello Moores  Register   On: 12/18/2016 14:00   Dg Chest Port 1 View  Result Date:  12/17/2016 CLINICAL DATA:  Status post bronchoscopy. EXAM: PORTABLE CHEST 1 VIEW COMPARISON:  CT chest 12/08/2016 FINDINGS: A right-sided PICC line is stable. The heart is enlarged. Atherosclerotic changes are present at the aortic arch. Mild pulmonary vascular congestion is present. Right greater than left pleural effusions are noted. Bibasilar airspace disease is associated. IMPRESSION: 1. Cardiomegaly and mild pulmonary vascular congestion. 2. Persistent bilateral pleural effusions, right greater than left. 3. Partial collapse of the right lower lobe in part related to central soft tissue mass. 4. No radiographic evidence for complication. Electronically Signed   By: San Morelle M.D.   On: 12/17/2016 10:08    Labs:  CBC:  Recent Labs  12/14/16 0412 12/15/16 0452 12/17/16 0450 12/19/16 1006  WBC 16.1* 10.4 13.0* 10.1  HGB 11.5* 10.6* 11.3* 10.1*  HCT 34.4* 32.0* 34.7* 31.6*  PLT 379 352 336 229    COAGS:  Recent Labs  12/04/16 1005  INR 1.15    BMP:  Recent Labs  12/16/16 0457 12/17/16 0450 12/18/16 0458 12/19/16 0358 12/19/16 1006  NA 139 140 139  --  140  K 3.2* 4.3 4.3  --  3.8  CL 101 101 98*  --  100*  CO2 33* 34* 36*  --  35*  GLUCOSE 100* 95 120*  --  109*  BUN '9 9 11  '$ --  14  CALCIUM 8.0* 8.1* 8.4*  --  7.4*  CREATININE 0.56 0.61 0.70 0.68 0.63  GFRNONAA >60 >60 >60 >60 >60  GFRAA >60 >60 >60 >60 >60    LIVER FUNCTION TESTS:  Recent Labs  12/03/16 1613 12/15/16 0452 12/19/16 1006  BILITOT 0.7 0.8 0.5  AST 19 10* 13*  ALT 13* 9* 11*  ALKPHOS 98 57 63  PROT 6.7 4.8* 5.1*  ALBUMIN 2.8* 1.9* 2.1*    Assessment and Plan: Perforated appendicitis with multiple intra-abdominal abscess s/p RLQ drain placed 12/04/16,  left transgluteal pelvic drain placed 12/14/16; path from recent bronch revelas squamous cell carcinoma; temp 99.1; WBC normal, hemoglobin 10.1, creatinine 0.68; chest x-ray today reveals degree of CHF with superimposed bibasilar  pneumonia not excluded. Recommend follow-up CT of abdomen and pelvis on 3/23 with drain injections to exclude fistula to bowel. Continue drain irrigations. Additional plans per surgery and critical care.   Electronically Signed: D. Rowe Robert 12/19/2016, 4:37 PM   I spent a total of 15 minutes at the the patient's bedside AND on the patient's hospital floor or unit, greater than 50% of which was counseling/coordinating care for pelvic abscess drains    Patient ID: Alisha Fisher, female   DOB: [birth year], 71 y.o.   MRN: [id number]

## 2016-12-20 ENCOUNTER — Ambulatory Visit
Admission: RE | Admit: 2016-12-20 | Discharge: 2016-12-20 | Disposition: A | Discharge status: Home / Self Care | Payer: Medicare Other | Source: Ambulatory Visit | Attending: Radiation Oncology | Admitting: Radiation Oncology

## 2016-12-20 ENCOUNTER — Telehealth: Payer: Self-pay | Admitting: Oncology

## 2016-12-20 ENCOUNTER — Ambulatory Visit
Admit: 2016-12-20 | Discharge: 2016-12-20 | Disposition: A | Discharge status: Home / Self Care | Payer: Medicare Other | Attending: Radiation Oncology | Admitting: Radiation Oncology

## 2016-12-20 DIAGNOSIS — C3412 Malignant neoplasm of upper lobe, left bronchus or lung: Secondary | ICD-10-CM | POA: Insufficient documentation

## 2016-12-20 DIAGNOSIS — C342 Malignant neoplasm of middle lobe, bronchus or lung: Secondary | ICD-10-CM

## 2016-12-20 DIAGNOSIS — Z510 Encounter for antineoplastic radiation therapy: Secondary | ICD-10-CM | POA: Insufficient documentation

## 2016-12-20 LAB — BASIC METABOLIC PANEL
Anion gap: 5 (ref 5–15)
BUN: 21 mg/dL — AB (ref 6–20)
CALCIUM: 8.3 mg/dL — AB (ref 8.9–10.3)
CO2: 42 mmol/L — ABNORMAL HIGH (ref 22–32)
CREATININE: 0.92 mg/dL (ref 0.44–1.00)
Chloride: 88 mmol/L — ABNORMAL LOW (ref 101–111)
GFR calc Af Amer: >60 mL/min (ref >60)
GLUCOSE: 112 mg/dL — AB (ref 65–99)
Potassium: 4.5 mmol/L (ref 3.5–5.1)
Sodium: 135 mmol/L (ref 135–145)

## 2016-12-20 LAB — CBC
HCT: 30.4 % — ABNORMAL LOW (ref 36.0–46.0)
Hemoglobin: 9.8 g/dL — ABNORMAL LOW (ref 12.0–15.0)
MCH: 30.9 pg (ref 26.0–34.0)
MCHC: 32.2 g/dL (ref 30.0–36.0)
MCV: 95.9 fL (ref 78.0–100.0)
PLATELETS: 213 10*3/uL (ref 150–400)
RBC: 3.17 MIL/uL — AB (ref 3.87–5.11)
RDW: 14.5 % (ref 11.5–15.5)
WBC: 11.9 10*3/uL — ABNORMAL HIGH (ref 4.0–10.5)

## 2016-12-20 LAB — PROCALCITONIN: Procalcitonin: 0.26 ng/mL

## 2016-12-20 MED ORDER — PREMIER PROTEIN SHAKE
11.0000 [oz_av] | ORAL | Status: DC
  Administered 2016-12-20 – 2016-12-21 (×2): 11 [oz_av] via ORAL
  Filled 2016-12-20 (×6): qty 325.31

## 2016-12-20 MED ORDER — PREDNISONE 20 MG PO TABS
60.0000 mg | ORAL_TABLET | Freq: Every day | ORAL | Status: DC
  Administered 2016-12-21 – 2016-12-24 (×4): 60 mg via ORAL
  Filled 2016-12-20 (×4): qty 3

## 2016-12-20 MED ORDER — ENOXAPARIN SODIUM 40 MG/0.4ML ~~LOC~~ SOLN
40.0000 mg | SUBCUTANEOUS | Status: DC
  Administered 2016-12-22 – 2016-12-26 (×5): 40 mg via SUBCUTANEOUS
  Filled 2016-12-20 (×5): qty 0.4

## 2016-12-20 NOTE — Progress Notes (Signed)
  Radiation Oncology         ([phone]) [phone] ________________________________  Name: Alisha Fisher MRN: [id number]  Date: 12/20/2016  DOB: 04-16-1946  SIMULATION AND TREATMENT PLANNING NOTE  DIAGNOSIS:     ICD-9-CM ICD-10-CM   1. 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  Identity was confirmed.  All relevant records and images related to the planned course of therapy were reviewed.   Written consent to proceed with treatment was confirmed which was freely given after reviewing the details related to the planned course of therapy had been reviewed with the patient.  Then, the patient was set-up in a stable reproducible  supine position for radiation therapy.  CT images were obtained.  Surface markings were placed.    Medically necessary complex treatment device(s) for immobilization:  Wing-board.   The CT images were loaded into the planning software.  Then the target and avoidance structures were contoured.  Treatment planning then occurred.  The radiation prescription was entered and confirmed.  A total of 3 complex treatment devices were fabricated which relate to the designed radiation treatment fields. Each of these customized fields/ complex treatment devices will be used on a daily basis during the radiation course. I have requested : 3D Simulation  I have requested a DVH of the following structures: CTV target, spinal cord, lungs.   The patient will undergo daily image guidance to ensure accurate localization of the target, and adequate minimize dose to the normal surrounding structures in close proximity to the target.   PLAN:  The patient will receive 30 Gy in 10 fractions.  ________________________________   Jodelle Gross, MD, PhD

## 2016-12-20 NOTE — Telephone Encounter (Signed)
Called ICU stepdown and spoke to [place], Therapist, sports.  Per Steffanie Dunn, patient is ok to have CT Simulation at 3 pm and travels by bed.  Notified Amy, RTT in CT Sim.

## 2016-12-20 NOTE — Progress Notes (Signed)
Nutrition Follow-up  DOCUMENTATION CODES:   Obesity unspecified  INTERVENTION:  - Continue to encourage PO intakes.  - Will trial Premier Protein once/day, this provides 160 kcal and 30 grams of protein. - RD will continue to monitor for needs.   NUTRITION DIAGNOSIS:   Inadequate oral intake related to acute illness, poor appetite as evidenced by per patient/family report. -revised.   GOAL:   Patient will meet greater than or equal to 90% of their needs -likely unmet at this time.   MONITOR:   PO intake, Supplement acceptance, Weight trends, Labs  ASSESSMENT:   71 y.o. female presented to the ED on 12/03/16 with c/o RLQ pain.  Abd CT showed perforated appendicitis with contained perforation/abscess -- s/p IR drain on 12/04/16.  Zosyn was started for broad coverage.  Patient reported rash and itching on 12/05/16 and this was suspected to be related to zosyn.  Zosyn d/ced on 12/06/16 and changed to cipro and flagyl.  On 12/07/16, pt's rash appeared to be getting worse after cipro started.  Abscess culture from 3/6/18now back with enterococcus faecalis.  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  3/22 Pt received TPN via PICC x1 day and then was on oral diet only. On 3/19 she ate 90% of dinner on Soft diet. No other intakes documented since previous assessment. Per chart review, weight +21.2 kg since admission which is likely fluid related. Estimated nutrition needs updated this AM with IBW used d/t weight increase. 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 Marin Olp now following. Notes also indicate pt with peritonitis.   Per reports that for breakfast she had ~50% of omelet, a few bites of hash browns, 50% of banana, ginger ale, V8 juice, and 100% of strawberry yogurt. Pt reports some mild intermittent nausea and abdominal pain the past few days but denies any vomiting. She states that her appetite is somewhat  variable and sometimes she feels hungry before receiving meal trays and not hungry once they arrive.   She states she would like to lose weight; talked with pt about this and the focus on adequate nutrition during hospitalization and that weight loss (other than loss of fluid that has accumulated) should not be the current focus. Pt in agreement. She indicates that she has been refusing Miralax as she does not want to have to go to the bathroom and be unable to make it there in time; this may be impacting PO intakes.   Per Radiology PA note yesterday, recommendation for CT abd/pelvis 3/23 with drain injections to r/o fistula to bowel. Note states RLQ drain placed 3/6 and L transgluteal pelvic drain placed 3/16.  Medications reviewed; 1000 units vitamin D/day, 40 mg IV Lasix BID, 100 mg Cozaar with 25 mg Hydrodiuril/day, 40 mg oral Protonix/day, 60 mg Deltasone/day, 250 mg Florastor BID, 17 g Miralax/day.  Labs reviewed; Cl: 88 mmol/L, BUN: 21 mg/dL, Ca: 8.3 mg/dL, AST and ALT slightly low.    3/16 - Continues to complain of abdominal pain and bloating.  - Continues to have poor oral intake and has not had adequate oral intake for at least 10 days.  - RD spoke to [facility] about initiating TPN for this patient as pt can not tolerate oral diet at this time.  - Pt with PICC line in place already.  - Pt does not like any supplements.  - No new weight on this pt since 3/5.  -  RD asked RN and nurse tech to obtain weight on this pt today.  - Plan is for pt to have drain placed in IR likely tomorrow.  - Monitor for refeeding; pt at high risk.       Diet Order:  DIET SOFT Room service appropriate? Yes; Fluid consistency: Thin  Skin:  Wound (see comment) (abdominal drains)  Last BM:  3/20  Height:   Ht Readings from Last 1 Encounters:  12/18/16 '5\' 7"'$  (1.702 m)    Weight:   Wt Readings from Last 1 Encounters:  12/20/16 271 lb 9.7 oz (123.2 kg)    Ideal Body Weight:  61.3 kg  BMI:  Body mass  index is 42.54 kg/m.  Estimated Nutritional Needs:   Kcal:  2025-2210 (33-36 kcal/kg IBW)  Protein:  120-135 (2-2.2 grams/kg IBW)  Fluid:  >1.8L  EDUCATION NEEDS:   No education needs identified at this time    Jarome Matin, MS, RD, LDN, CNSC Inpatient Clinical Dietitian Pager # [phone] After hours/weekend pager # [phone]

## 2016-12-20 NOTE — Progress Notes (Signed)
Pt not on BIPAP at this time.  

## 2016-12-20 NOTE — Consult Note (Signed)
Radiation Oncology         [phone] ________________________________  Name: Alisha Fisher MRN: [id number]  Date: 12/19/16  DOB: 23-Jul-1946  CC:Pcp Not In System  No ref. provider found     REFERRING PHYSICIAN: No ref. provider found   DIAGNOSIS: The primary encounter diagnosis was Ruptured appendicitis. Diagnoses of Perforated appendicitis, Shortness of breath, Pulmonary mass, Pleural effusion, right, Pelvic fluid collection, Acute appendicitis with perforation and peritoneal abscess, S/P bronchoscopy, Low O2 saturation, Respiratory distress, Perforated appendix, Intra-abdominal abscess ([place]), Lymphadenopathy, and Fistula were also pertinent to this visit.   HISTORY OF PRESENT ILLNESS: Alisha Fisher is a 71 y.o. female seen at the request of Dr. Marin Olp for a new diagonsis of NSCLC, squamous cell carcinoma of the right upper lobe. The patient apparently was found to have a right lung mass at an outside facility by diagnostic imaging last month and was treated for pneumonia at that time. She was discharged and returned to the ED for evaluation at [facility] on 12/03/16 due to abdominal pain and found to have ruptured appendicitis with abscess. She has been in the ICU on several courses of IV antibiotics and has had percutaneous drainage of her abscess. She has also undergone repeat imaging of the chest revealing bilateral pleural effusions and associated thickening of the pleural surface without nodularity. Soft tissue density occlusion of the right lower lobe bronchus with complete right lower lobe lung mass, unchanged from her previous films. She did have occlusion of the right middle lobe bronchus with soft tissue density and worsened right middle lobe atelectasis. Mild centrilobular and paraseptal emphysema and a 2.1 x 1.14 cm solid left upper lobe nodule was seen but also stable to the last scan one month ago. This study also identified bilateral axillary adenopathy. The study was limited by lack  of IV contras. She underwent bronchoscopy on 12/17/16, and biopsies of the right lung tumor revealed squamous cell carcinoma. We are asked to see her to discuss the options for palliative radiotherapy.     PREVIOUS RADIATION THERAPY: No   PAST MEDICAL HISTORY:  Past Medical History:  Diagnosis Date  . COPD, severe ([place])   . HTN (hypertension)   . Pneumonia   . Pulmonary emphysema ([place])   . SOB (shortness of breath)        PAST SURGICAL HISTORY: Past Surgical History:  Procedure Laterality Date  . CATARACT EXTRACTION    . ENDOBRONCHIAL ULTRASOUND Bilateral 12/17/2016   Procedure: ENDOBRONCHIAL ULTRASOUND;  Surgeon: Javier Glazier, MD;  Location: [facility] ENDOSCOPY;  Service: Cardiopulmonary;  Laterality: Bilateral;     FAMILY HISTORY:  Family History  Problem Relation Age of Onset  . Allergies    . COPD Cousin   . Breast cancer Mother   . Diabetes Father   . Diabetes Son      SOCIAL HISTORY:  reports that she quit smoking about 7 weeks ago. Her smoking use included Cigarettes. She has a 25.00 pack-year smoking history. She has never used smokeless tobacco. She reports that she does not drink alcohol or use drugs.   ALLERGIES: Ciprofloxacin; Flagyl [metronidazole]; and Zosyn [piperacillin sod-tazobactam so]   MEDICATIONS:  Current Facility-Administered Medications  Medication Dose Route Frequency Provider Last Rate Last Dose  . 0.9 %  sodium chloride infusion  250 mL Intravenous PRN Michael Boston, MD      . 0.9 %  sodium chloride infusion   Intravenous Once Javier Glazier, MD      . acetaminophen ([place])  tablet 650 mg  650 mg Oral Q6H PRN Jerrye Beavers, PA-C   650 mg at 12/19/16 1059  . albuterol (PROVENTIL) (2.5 MG/3ML) 0.083% nebulizer solution 2.5 mg  2.5 mg Nebulization Q6H PRN Johnathan Hausen, MD   2.5 mg at 12/18/16 1319  . amLODipine (NORVASC) tablet 10 mg  10 mg Oral Daily Michael Boston, MD   10 mg at 12/20/16 1013  . arformoterol (BROVANA) nebulizer solution  15 mcg  15 mcg Nebulization Q12H Erick Colace, NP   15 mcg at 12/20/16 0803  . budesonide (PULMICORT) nebulizer solution 0.5 mg  0.5 mg Nebulization Q12H Erick Colace, NP   0.5 mg at 12/20/16 0803  . chlorhexidine (PERIDEX) 0.12 % solution 15 mL  15 mL Mouth Rinse BID Erick Colace, NP   15 mL at 12/20/16 1000  . cholecalciferol (VITAMIN D) tablet 1,000 Units  1,000 Units Oral Daily Michael Boston, MD   1,000 Units at 12/20/16 1013  . citalopram (CELEXA) tablet 10 mg  10 mg Oral Daily Michael Boston, MD   10 mg at 12/20/16 1013  . enoxaparin (LOVENOX) injection 40 mg  40 mg Subcutaneous Q24H Earnstine Regal, PA-C   40 mg at 12/20/16 1014  . furosemide (LASIX) injection 40 mg  40 mg Intravenous Q12H Praveen Mannam, MD   40 mg at 12/20/16 0515  . hydrALAZINE (APRESOLINE) injection 5-20 mg  5-20 mg Intravenous Q6H PRN Michael Boston, MD   5 mg at 12/17/16 1109  . losartan (COZAAR) tablet 100 mg  100 mg Oral Daily Berton Mount, RPH   100 mg at 12/20/16 1014   And  . hydrochlorothiazide (HYDRODIURIL) tablet 25 mg  25 mg Oral Daily Berton Mount, RPH   25 mg at 12/20/16 1013  . iopamidol (ISOVUE-300) 61 % injection 15 mL  15 mL Oral BID PRN Jerrye Beavers, PA-C   15 mL at 12/13/16 0653  . iopamidol (ISOVUE-300) 61 % injection 15 mL  15 mL Oral Once PRN Jerrye Beavers, PA-C      . ipratropium-albuterol (DUONEB) 0.5-2.5 (3) MG/3ML nebulizer solution 3 mL  3 mL Nebulization TID Johnathan Hausen, MD   3 mL at 12/20/16 0803  . lip balm (CARMEX) ointment 1 application  1 application Topical BID Michael Boston, MD   1 application at 17/51/02 1000  . magic mouthwash  15 mL Oral QID PRN Michael Boston, MD      . MEDLINE mouth rinse  15 mL Mouth Rinse q12n4p Erick Colace, NP   15 mL at 12/20/16 1200  . meperidine (DEMEROL) injection 6.25-12.5 mg  6.25-12.5 mg Intravenous Q5 min PRN Annye Asa, MD      . metoprolol (LOPRESSOR) injection 5 mg  5 mg Intravenous Q6H PRN Michael Boston, MD      .  metoprolol tartrate (LOPRESSOR) tablet 150 mg  150 mg Oral Daily Johnathan Hausen, MD   150 mg at 12/20/16 1013  . midazolam (VERSED) injection 0.5-2 mg  0.5-2 mg Intravenous Once PRN Annye Asa, MD      . ondansetron (ZOFRAN-ODT) disintegrating tablet 4 mg  4 mg Oral Q6H PRN Johnathan Hausen, MD       Or  . ondansetron [facility]) injection 4 mg  4 mg Intravenous Q6H PRN Johnathan Hausen, MD   4 mg at 12/19/16 1541  . pantoprazole (PROTONIX) EC tablet 40 mg  40 mg Oral Q1200 Michael Boston, MD   40 mg at 12/20/16 1224  . polyethylene  glycol (MIRALAX / GLYCOLAX) packet 17 g  17 g Oral Daily Jerrye Beavers, PA-C   17 g at 12/19/16 1000  . [START ON 12/21/2016] predniSONE (DELTASONE) tablet 60 mg  60 mg Oral Q breakfast Praveen Mannam, MD      . promethazine (PHENERGAN) injection 6.25-12.5 mg  6.25-12.5 mg Intravenous Q15 min PRN Annye Asa, MD      . protein supplement (PREMIER PROTEIN) liquid  11 oz Oral Q24H Greer Pickerel, MD      . saccharomyces boulardii (FLORASTOR) capsule 250 mg  250 mg Oral BID Michael Boston, MD   250 mg at 12/20/16 1013  . sodium chloride flush (NS) 0.9 % injection 10-40 mL  10-40 mL Intracatheter PRN Jerrye Beavers, PA-C   10 mL at 12/14/16 0414  . sodium chloride flush (NS) 0.9 % injection 10-40 mL  10-40 mL Intracatheter PRN Saverio Danker, PA-C      . sodium chloride flush (NS) 0.9 % injection 3 mL  3 mL Intravenous Q12H Michael Boston, MD   3 mL at 12/20/16 1000  . sodium chloride flush (NS) 0.9 % injection 3 mL  3 mL Intravenous PRN Michael Boston, MD   3 mL at 12/18/16 1005     REVIEW OF SYSTEMS: On review of systems, the patient reports that she is doing well overall. she denies any chest pain, shortness of breath, cough, fevers, chills, night sweats, unintended weight changes. She denies any bowel or bladder disturbances, and denies abdominal pain, nausea or vomiting. She denies any new musculoskeletal or joint aches or pains. A complete review of systems is obtained and  is otherwise negative.     PHYSICAL EXAM:  Wt Readings from Last 3 Encounters:  12/20/16 271 lb 9.7 oz (123.2 kg)  07/07/13 265 lb (120.2 kg)  05/21/12 228 lb 9.6 oz (103.7 kg)   Temp Readings from Last 3 Encounters:  12/20/16 99 F (37.2 C) (Core (Comment))  05/21/12 98 F (36.7 C) (Oral)  04/07/12 98.5 F (36.9 C) (Oral)   BP Readings from Last 3 Encounters:  12/20/16 (!) 155/52  07/07/13 (!) 142/78  05/21/12 140/82   Pulse Readings from Last 3 Encounters:  12/20/16 76  07/07/13 92  05/21/12 (!) 106   Pain Assessment Pain Score: 3 /10  In general this is a chronically ill appearing Caucasian female in no acute distress. She is alert and oriented x4 and appropriate throughout the examination. HEENT reveals that the patient is normocephalic, atraumatic. EOMs are intact. PERRLA. Skin is intact without any evidence of gross lesions. Cardiovascular exam reveals a regular rate and rhythm, no clicks rubs or murmurs are auscultated. Chest is clear to auscultation bilaterally. Lymphatic assessment is performed and does not reveal any adenopathy in the cervical, supraclavicular, axillary, or inguinal chains. Abdomen has active bowel sounds in all quadrants and is intact. The abdomen is soft, non tender, non distended. Lower extremities are negative for pretibial pitting edema, deep calf tenderness, cyanosis or clubbing.   ECOG = 3  0 - Asymptomatic (Fully active, able to carry on all predisease activities without restriction)  1 - Symptomatic but completely ambulatory (Restricted in physically strenuous activity but ambulatory and able to carry out work of a light or sedentary nature. For example, light housework, office work)  2 - Symptomatic, <50% in bed during the day (Ambulatory and capable of all self care but unable to carry out any work activities. Up and about more than 50% of waking hours)  3 -  Symptomatic, >50% in bed, but not bedbound (Capable of only limited self-care,  confined to bed or chair 50% or more of waking hours)  4 - Bedbound (Completely disabled. Cannot carry on any self-care. Totally confined to bed or chair)  5 - Death   Eustace Pen MM, Creech RH, Tormey DC, et al. [phone]). "Toxicity and response criteria of the [facility]". [place] Oncol. 5 (6): 649-55    LABORATORY DATA:  Lab Results  Component Value Date   WBC 11.9 (H) 12/20/2016   HGB 9.8 (L) 12/20/2016   HCT 30.4 (L) 12/20/2016   MCV 95.9 12/20/2016   PLT 213 12/20/2016   Lab Results  Component Value Date   NA 135 12/20/2016   K 4.5 12/20/2016   CL 88 (L) 12/20/2016   CO2 42 (H) 12/20/2016   Lab Results  Component Value Date   ALT 11 (L) 12/19/2016   AST 13 (L) 12/19/2016   ALKPHOS 63 12/19/2016   BILITOT 0.5 12/19/2016      RADIOGRAPHY: [place] Chest  Result Date: 12/11/2016 CLINICAL DATA:  Patient with history of right lung endobronchial mass, perforated appendicitis with recent abscess drainage, small pleural effusions. Request made for right thoracentesis . EXAM: CHEST ULTRASOUND COMPARISON:  CT chest done 12/08/2016 FINDINGS: Limited ultrasound right posterior chest region reveals a very small loculated pleural effusion. IMPRESSION: Limited ultrasound right posterior chest today reveals a very small loculated pleural effusion. Options of proceeding with diagnostic right thoracentesis versus obtaining follow-up assessment of fluid volume in a few days for thoracentesis were discussed with patient. She wishes to postpone thoracentesis today and re-evaluate fluid volume in a few days. Procedure not performed. Read by: Rowe Robert, PA-C Electronically Signed   By: Jacqulynn Cadet M.D.   On: 12/11/2016 10:36   Ct Abdomen Pelvis W Contrast  Result Date: 12/13/2016 CLINICAL DATA:  Status post drain placement for ruptured appendix. Right lower quadrant pain. Right lung endobronchial mass. EXAM: CT ABDOMEN AND PELVIS WITH CONTRAST TECHNIQUE: Multidetector CT  imaging of the abdomen and pelvis was performed using the standard protocol following bolus administration of intravenous contrast. CONTRAST:  158m ISOVUE-300 IOPAMIDOL (ISOVUE-300) INJECTION 61%, 140mISOVUE-300 IOPAMIDOL (ISOVUE-300) INJECTION 61% COMPARISON:  12/03/2016. FINDINGS: Lower chest: Right worse than left base airspace disease with heterogeneous right lung attenuation, likely related to the known central obstruction. Small bilateral pleural effusions are similar to 12/08/2016. Moderate cardiomegaly. Hepatobiliary: Cirrhosis, without focal liver lesion. Normal gallbladder, without biliary ductal dilatation. Pancreas: Normal, without mass or ductal dilatation. Spleen: Normal in size, without focal abnormality. Adrenals/Urinary Tract: Normal adrenal glands. Normal kidneys, without hydronephrosis. Normal urinary bladder. Stomach/Bowel: Normal stomach, without wall thickening. Normal colon and terminal ileum. The appendix is no longer confidently identified. Normal small bowel caliber. Vascular/Lymphatic: Advanced aortic and branch vessel atherosclerosis. Patent portal and splenic veins. No evidence of portal venous hypertension. No abdominopelvic adenopathy. Reproductive: Normal uterus.  No adnexal mass. Other: Similar small volume perihepatic ascites. Loculated ascites with peritoneal thickening in the anterior left abdomen. Example 14.5 x 1.8 cm on image 47/series 2. This is similar to decreased from 17.1 x 2.7 cm on the prior. Percutaneous drain within the right lower quadrant. No surrounding fluid collection identified. Cul-de-sac fluid with peritoneal thickening. Collection measures 6.8 x 3.6 cm on image 75/series 2. This is similar in size on the prior exam. The peritoneal thickening is new or increased. contiguous trace left pelvic fluid on image 76/series 2. Anasarca. Musculoskeletal: Degenerate disc disease at L3-4  IMPRESSION: 1. Interval percutaneous drain of the right lower quadrant fluid  collection, which has resolved. 2. Loculated ascites within the left side of the abdomen and pelvic cul-de-sac. These are similar to decreased in size since 12/03/2016. Suspicious for infected ascites. 3. Similar appearance of the lung bases, with pleural fluid and bibasilar consolidation. Please see chest CT of 12/08/2016 for description of probable right sided neoplasm. Electronically Signed   By: Abigail Miyamoto M.D.   On: 12/13/2016 12:16   Ct Abdomen Pelvis W Contrast  Result Date: 12/03/2016 CLINICAL DATA:  RIGHT lower quadrant pain for 3 days. History of COPD, hypertension. EXAM: CT ABDOMEN AND PELVIS WITH CONTRAST TECHNIQUE: Multidetector CT imaging of the abdomen and pelvis was performed using the standard protocol following bolus administration of intravenous contrast. CONTRAST:  178m ISOVUE-300 IOPAMIDOL (ISOVUE-300) INJECTION 61% COMPARISON:  CT chest November 14, 2016 FINDINGS: LOWER CHEST: Heterogeneously hypo dense stable mass RIGHT lower lobe. LEFT lung base atelectasis with small LEFT pleural effusion. Resolution of pericardial effusion. Heart size is mildly enlarged. Mild coronary artery calcifications. HEPATOBILIARY: Nodular liver consistent with cirrhosis, otherwise unremarkable. Normal gallbladder. PANCREAS: Normal. SPLEEN: Normal. ADRENALS/URINARY TRACT: Kidneys are orthotopic, demonstrating symmetric enhancement. Asymmetrically smaller RIGHT kidney, unchanged. No nephrolithiasis, hydronephrosis or solid renal masses. The unopacified ureters are normal in course and caliber. Delayed imaging through the kidneys demonstrates symmetric prompt contrast excretion within the proximal urinary collecting system. Urinary bladder is partially distended and unremarkable. Normal adrenal glands. STOMACH/BOWEL: The stomach, small and large bowel are normal in course and caliber without inflammatory changes. 2.9 x 5.8 x 5 cm (transverse by AP by CC) rim enhancing the fluid collection and gas RIGHT lower  quadrant contiguous with the appendix which is enlarged at 13 mm. VASCULAR/LYMPHATIC: Aortoiliac vessels are normal in course and caliber, severe calcific atherosclerosis. No lymphadenopathy by CT size criteria. Mild Misty mesentery. REPRODUCTIVE: Normal. OTHER: Moderate low-density intraperitoneal free fluid. Mild smoothly enhancing peritoneum. MUSCULOSKELETAL: Nonacute. Small fat and fluid containing umbilical hernia. Grade 1 L4-5 anterolisthesis. Moderate to severe degenerative change of lower lumbar spine. IMPRESSION: Perforated appendicitis with 2.9 x 5.8 x 5 cm RIGHT lower quadrant contained perforation/abscess. Moderate ascites with suspected peritonitis. Cirrhosis. Stable RIGHT lower lobe mass previously characterized as primary lung neoplasm on CT chest November 14, 2016. Slightly increasing small LEFT pleural effusion. Acute findings discussed with and reconfirmed by Dr.ANTHONY ALLEN on 12/03/2016 at 7:07 pm. Electronically Signed   By: CElon AlasM.D.   On: 12/03/2016 19:08   Dg Chest Port 1 View  Result Date: 12/19/2016 CLINICAL DATA:  Respiratory distress EXAM: PORTABLE CHEST 1 VIEW COMPARISON:  December 18, 2016 FINDINGS: Central catheter tip is in the superior vena cava. No pneumothorax. There is airspace consolidation in each lower lobe with small pleural effusions bilaterally. There is cardiomegaly with mild pulmonary venous hypertension. No adenopathy. There is atherosclerotic calcification in the aorta. No bone lesions. IMPRESSION: Central catheter tip in superior vena cava. No pneumothorax. Findings indicative of a degree of congestive heart failure. Superimposed bibasilar pneumonia cannot be excluded radiographically. Pneumonia and alveolar edema may present concurrently. There is aortic atherosclerosis. Electronically Signed   By: WLowella GripIII M.D.   On: 12/19/2016 08:56   Dg Chest Port 1 View  Result Date: 12/18/2016 CLINICAL DATA:  Low oxygen saturation. EXAM: PORTABLE  CHEST 1 VIEW COMPARISON:  12/17/2016.  CT 12/08/2016. FINDINGS: Right PICC line noted with tip projected over the right atrium. Cardiomegaly with pulmonary vascular prominence, bilateral interstitial prominence, bilateral  pleural effusions. Findings consistent CHF. Interim slight progression from prior exam. Right infrahilar mass again noted. IMPRESSION: 1. Right PICC line in stable position. 2. Congestive heart failure bilateral pulmonary interstitial edema bilateral pleural effusions. Interstitial edema is progressed from prior exam. 3. Right infrahilar mass again noted. Electronically Signed   By: Marcello Moores  Register   On: 12/18/2016 14:00   Dg Chest Port 1 View  Result Date: 12/17/2016 CLINICAL DATA:  Status post bronchoscopy. EXAM: PORTABLE CHEST 1 VIEW COMPARISON:  CT chest 12/08/2016 FINDINGS: A right-sided PICC line is stable. The heart is enlarged. Atherosclerotic changes are present at the aortic arch. Mild pulmonary vascular congestion is present. Right greater than left pleural effusions are noted. Bibasilar airspace disease is associated. IMPRESSION: 1. Cardiomegaly and mild pulmonary vascular congestion. 2. Persistent bilateral pleural effusions, right greater than left. 3. Partial collapse of the right lower lobe in part related to central soft tissue mass. 4. No radiographic evidence for complication. Electronically Signed   By: San Morelle M.D.   On: 12/17/2016 10:08   Dg Chest Port 1 View  Result Date: 12/05/2016 CLINICAL DATA:  Weakness and shortness of breath EXAM: PORTABLE CHEST 1 VIEW COMPARISON:  11/19/2016 FINDINGS: Moderate cardiomegaly with central vascular congestion. There are small bilateral pleural effusions and hazy bibasilar atelectasis or infiltrate. No pneumothorax. Atherosclerosis. IMPRESSION: 1. Cardiomegaly with central vascular congestion and small bilateral effusions 2. Hazy bibasilar atelectasis or infiltrates. Electronically Signed   By: Donavan Foil M.D.   On:  12/05/2016 15:29   Ct Image Guided Drainage By Percutaneous Catheter  Result Date: 12/14/2016 INDICATION: Postop pelvic fluid collection, status post perforated appendicitis, worsening white count EXAM: CT-GUIDED PELVIC FLUID COLLECTION DRAINAGE VIA A LEFT TRANS GLUTEAL APPROACH. MEDICATIONS: The patient is currently admitted to the hospital and receiving intravenous antibiotics. The antibiotics were administered within an appropriate time frame prior to the initiation of the procedure. ANESTHESIA/SEDATION: Fentanyl 200 mcg IV; Versed 3.0 mg IV Moderate Sedation Time:  21 minutes The patient was continuously monitored during the procedure by the interventional radiology nurse under my direct supervision. COMPLICATIONS: None. PROCEDURE: Informed written consent was obtained from the patient after a thorough discussion of the procedural risks, benefits and alternatives. All questions were addressed. Maximal Sterile Barrier Technique was utilized including caps, mask, sterile gowns, sterile gloves, sterile drape, hand hygiene and skin antiseptic. A timeout was performed prior to the initiation of the procedure. Previous imaging reviewed. Patient positioned nearly prone. Noncontrast localization CT performed. The left trans gluteal approach to the cul-de-sac pelvic fluid collection was localized. Overlying skin marked. Under sterile conditions and local anesthesia, a 17 gauge 16.8 cm access needle was advanced from a left trans gluteal posterior oblique approach into the fluid collection. Needle position confirmed with CT. Guidewire inserted. Guidewire position confirmed with CT. Tract dilatation performed to insert a 10 [place] drain. Drain catheter positioned within the fluid collection. Position confirmed with CT. Catheter secured with a Prolene suture and connected to external suction bulb. Sterile dressing applied. No immediate complication. Patient tolerated the procedure well. IMPRESSION: Successful CT-guided  pelvic fluid collection drain insertion via left trans gluteal approach. Electronically Signed   By: Jerilynn Mages.  Shick M.D.   On: 12/14/2016 16:36   Ct Image Guided Drainage By Percutaneous Catheter  Result Date: 12/05/2016 INDICATION: Periappendiceal abscess EXAM: CT GUIDED DRAINAGE OF RIGHT LOWER QUADRANT ABSCESS MEDICATIONS: The patient is currently admitted to the hospital and receiving intravenous antibiotics. The antibiotics were administered within an appropriate time frame prior  to the initiation of the procedure. ANESTHESIA/SEDATION: Two mg IV Versed 75 mcg IV Fentanyl Moderate Sedation Time:  17 The patient was continuously monitored during the procedure by the interventional radiology nurse under my direct supervision. COMPLICATIONS: None immediate. TECHNIQUE: Informed written consent was obtained from the patient after a thorough discussion of the procedural risks, benefits and alternatives. All questions were addressed. Maximal Sterile Barrier Technique was utilized including caps, mask, sterile gowns, sterile gloves, sterile drape, hand hygiene and skin antiseptic. A timeout was performed prior to the initiation of the procedure. PROCEDURE: The right lower quadrant was prepped with ChloraPrep in a sterile fashion, and a sterile drape was applied covering the operative field. A sterile gown and sterile gloves were used for the procedure. Local anesthesia was provided with 1% Lidocaine. Under CT guidance, an 18 gauge needle was advanced into the right lower quadrant abscess and removed over an Amplatz wire. Twelve [place] dilator followed by a 12 [place] drain were inserted. It was looped and string fixed then sewn to the skin. FINDINGS: Images document right lower quadrant 12 French drain placement into an abscess. IMPRESSION: Successful CT-guided right lower quadrant abscess 12 French drain. Electronically Signed   By: Marybelle Killings M.D.   On: 12/05/2016 09:10   Ct Chest Nodule Follow Up Low Dose  W/o  Result Date: 12/08/2016 CLINICAL DATA:  71 year old female admitted with perforated appendicitis status post percutaneous drain placement for right lower quadrant abscess 4 days prior, presenting for follow-up of recently diagnosed right lower lobe lung mass. EXAM: CT CHEST WITHOUT CONTRAST TECHNIQUE: Multidetector CT imaging of the chest was performed following the standard protocol without IV contrast. COMPARISON:  11/14/2016 chest CT. 12/05/2016 chest radiograph. FINDINGS: Cardiovascular: Mild cardiomegaly. No residual significant pericardial effusion/ thickening. Left anterior descending, left circumflex and right coronary atherosclerosis. Atherosclerotic thoracic aorta. Stable ectasia of the ascending thoracic aorta with maximum diameter 4.2 cm. Stable dilated main pulmonary artery (4.1 cm diameter). Mediastinum/Nodes: No discrete thyroid nodules. Unremarkable esophagus. New bilateral axillary lymphadenopathy measuring up to the 1.2 cm on the right (series 2/ image 34) and 1.2 cm on the left (series 2/image 38). Mild right paratracheal adenopathy measuring up to the 1.2 cm (series 2/ image 59), stable. Mildly enlarged 1.2 cm subcarinal node (series 2/ image 71), stable. No additional pathologically enlarged mediastinal nodes. Stable mild right hilar lymphadenopathy, poorly delineated on this noncontrast scan. No gross left hilar adenopathy on this noncontrast scan. Lungs/Pleura: Small dependent bilateral pleural effusions, increased bilaterally since 11/14/2016. Associated mild smooth pleural thickening on the right without discrete pleural nodularity. No pneumothorax. Stable soft tissue density occlusion of the right lower lobe bronchus with complete right lower lobe atelectasis, suspicious for obstructing central right lower lobe lung mass, poorly delineated on this noncontrast chest CT, not appreciably changed since 11/14/2016. The right middle lobe bronchus also appears occluded with soft tissue  density with worsened significant right middle lobe atelectasis. Mild centrilobular and paraseptal emphysema with mild diffuse bronchial wall thickening. Irregular 2.1 x 1.4 cm solid medial left upper lobe pulmonary nodule (series 5/ image 52), stable since 11/14/2016. Mild interlobular septal thickening throughout both lungs. Mild-to-moderate compressive atelectasis in the dependent left lower lobe. Upper abdomen: Relative hypertrophy of the lateral segment left liver lobe with diffuse liver surface irregularity, compatible with cirrhosis. Small volume ascites in the visualized upper abdomen. Musculoskeletal: No aggressive appearing focal osseous lesions. Mild thoracic spondylosis. IMPRESSION: 1. Persistent soft tissue density occlusion of the right lower and right middle lobe  bronchi with complete right lower lobe atelectasis and worsening right middle lobe atelectasis. Primary bronchogenic carcinoma of the central right lower lung is the diagnosis of exclusion. 2. Stable right hilar, subcarinal and right paratracheal lymphadenopathy. New symmetric mild axillary lymphadenopathy. Nodal metastases not excluded. 3. Irregular 2.1 cm solid medial left upper lobe pulmonary nodule, stable since 11/14/2016, suspicious for contralateral pulmonary metastasis versus synchronous primary bronchogenic carcinoma . 4. Spectrum of findings suggestive of mild congestive heart failure including mild cardiomegaly, small dependent bilateral pleural effusions and diffuse mild interlobular septal thickening characteristic of mild pulmonary edema. 5. Mild emphysema with mild diffuse bronchial wall thickening, suggesting COPD . 6. Cirrhosis.  Small volume ascites in the upper abdomen. 7. Aortic atherosclerosis.  Three-vessel coronary atherosclerosis. 8. 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 Recommend annual imaging followup by CTA or MRA. This recommendation follows 2010  ACCF/AHA/AATS/ACR/ASA/SCA/SCAI/SIR/STS/SVM Guidelines for the Diagnosis and Management of Patients with Thoracic Aortic Disease. Circulation. 2010; 121: [id number]. 9. Stable prominently dilated main pulmonary artery, suggesting pulmonary arterial hypertension. Electronically Signed   By: Ilona Sorrel M.D.   On: 12/08/2016 11:30       IMPRESSION/PLAN: 1. Unstaged NSCLC, squamous cell carcinoma of the right lung. The patient is counseled by Dr. Lisbeth Renshaw on the findings from her recent imaging and pathology results. We discussed that the recommendation for completing her staging work up would include an MRI of the brain with and without contrast, as well as a PET scan as an outpatient. We will order her MRI to be performed while she's inpatient. We reviewed the concerns that her tumor is encasing her right sided primary and secondary bronchi and reviewed the use of radiotherapy in obtaining local tumor regression to help palliate her breathing. Dr. Lisbeth Renshaw would recommend a course of 30 Gy in 10 fractions to the right lung, but reviews that we could extend her treatment to a more definitive course if she does not have metastatic disease once her workup is complete. We discussed the risks, benefits, short, and long term effects of radiotherapy, and the patient is interested in proceeding. Dr. Lisbeth Renshaw discusses the delivery and logistics of radiotherapy. Written consent is obtained and she will come to our department today for simulation, to begin treatment on 12/21/16.   The above documentation reflects my direct findings during this shared patient visit. Please see the separate note by Dr. Lisbeth Renshaw on this date for the remainder of the patient's plan of care.    Carola Rhine, PAC

## 2016-12-20 NOTE — Progress Notes (Signed)
Arrived to Pt room for BiPAP check and found Pt on 4 Lpm nasal cannula.  I awoke Pt and tried to provide education enough to convince Pt to wear BiPAP again but she refused stating that she couldn't tolerate it.  Pt encouraged to contact RT if she changes her mind.

## 2016-12-20 NOTE — Progress Notes (Addendum)
[facility] Physician Requesting Consult:  Alisha Fisher, M.D. / CCS  Date of Consult:  12/10/2016  Reason for Consult/Chief Complaint:  Right Lung Endobronchial Mass  History of Presenting Illness:  71 y.o. With a history of emphysema and essential hypertension admitted in February 2010 at outside hospital. She was found to have what appeared to be an endobronchial mass causing subsequent collapse with in her right lower lobe. She was evaluated by pulmonary at outside hospital and the plan was for the patient to undergo bronchoscopy on 3/7. Patient was admitted on 3/5 to our facility with a ruptured appendix and associated peritonitis/abscess. Patient reports that her abdominal pain seems to be well-controlled but she does continue to have intermittent dyspnea. She also endorses intermittent nausea. She denies any chronic chest discomfort but did experience a "heaviness" in her chest yesterday that spontaneously resolved. She denies any dysphagia or odynophagia. She denies any headache or focal vision changes. She does endorse some intermittent left thigh numbness but no other focal weakness or tingling. She denies any subjective chills or sweats. She denies any weight loss. Of note on the patient was on Zosyn she developed a rash over her whole body as well as some sloughing of her oral mucous membranes. She denies any abnormal bruising. She does report that she has had some dyspnea since 2013 approximately.  Subjective/interval Feels well Refused to wear Bipap yesterday No major events   Objective Temp:  [98.3 F (36.8 C)-99.1 F (37.3 C)] 98.3 F (36.8 C) (03/21 2000) Pulse Rate:  [76-81] 76 (03/22 0326) Resp:  [14-24] 15 (03/22 0900) BP: (130-176)/(42-66) 153/48 (03/22 0900) SpO2:  [89 %-99 %] 91 % (03/22 0900) FiO2 (%):  [40 %] 40 % (03/21 2327) Weight:  [271 lb 9.7 oz (123.2 kg)] 271 lb 9.7 oz (123.2 kg) (03/22 0500)   Blood pressure (!) 153/48, pulse 76,  temperature 98.3 F (36.8 C), resp. rate 15, height '5\' 7"'$  (1.702 m), weight 271 lb 9.7 oz (123.2 kg), SpO2 91 %. Gen:      No acute distress HEENT:  EOMI, sclera anicteric Neck:     No masses; no thyromegaly Lungs:    Clear to auscultation bilaterally; normal respiratory effort CV:         Regular rate and rhythm; no murmurs Abd:      + bowel sounds; soft, non-tender; no palpable masses, no distension Ext:    No edema; adequate peripheral perfusion Skin:      Warm and dry; no rash Neuro: alert and oriented x 3 Psych: normal mood and affect  CBC Recent Labs     12/19/16  1006  12/20/16  0500  WBC  10.1  11.9*  HGB  10.1*  9.8*  HCT  31.6*  30.4*  PLT  229  213    Coag's No results for input(s): APTT, INR in the last 72 hours.  BMET Recent Labs     12/18/16  0458  12/19/16  0358  12/19/16  1006  12/20/16  0500  NA  139   --   140  135  K  4.3   --   3.8  4.5  CL  98*   --   100*  88*  CO2  36*   --   35*  42*  BUN  11   --   14  21*  CREATININE  0.70  0.68  0.63  0.92  GLUCOSE  120*   --  109*  112*    Electrolytes Recent Labs     12/18/16  0458  12/19/16  1006  12/20/16  0500  CALCIUM  8.4*  7.4*  8.3*    Sepsis Markers Recent Labs     12/19/16  1406  12/20/16  0500  PROCALCITON  0.21  0.26    ABG Recent Labs     12/18/16  1330  12/18/16  1610  PHART  7.142*  7.217*  PCO2ART  116*  92.6*  PO2ART  85.7  95.8    Liver Enzymes Recent Labs     12/19/16  1006  AST  13*  ALT  11*  ALKPHOS  63  BILITOT  0.5  ALBUMIN  2.1*    Cardiac Enzymes No results for input(s): TROPONINI, PROBNP in the last 72 hours.  Glucose No results for input(s): GLUCAP in the last 72 hours.  Imaging Dg Chest Port 1 View  Result Date: 12/19/2016 CLINICAL DATA:  Respiratory distress EXAM: PORTABLE CHEST 1 VIEW COMPARISON:  December 18, 2016 FINDINGS: Central catheter tip is in the superior vena cava. No pneumothorax. There is airspace consolidation in each lower  lobe with small pleural effusions bilaterally. There is cardiomegaly with mild pulmonary venous hypertension. No adenopathy. There is atherosclerotic calcification in the aorta. No bone lesions. IMPRESSION: Central catheter tip in superior vena cava. No pneumothorax. Findings indicative of a degree of congestive heart failure. Superimposed bibasilar pneumonia cannot be excluded radiographically. Pneumonia and alveolar edema may present concurrently. There is aortic atherosclerosis. Electronically Signed   By: Lowella Grip III M.D.   On: 12/19/2016 08:56   Dg Chest Port 1 View  Result Date: 12/18/2016 CLINICAL DATA:  Low oxygen saturation. EXAM: PORTABLE CHEST 1 VIEW COMPARISON:  12/17/2016.  CT 12/08/2016. FINDINGS: Right PICC line noted with tip projected over the right atrium. Cardiomegaly with pulmonary vascular prominence, bilateral interstitial prominence, bilateral pleural effusions. Findings consistent CHF. Interim slight progression from prior exam. Right infrahilar mass again noted. IMPRESSION: 1. Right PICC line in stable position. 2. Congestive heart failure bilateral pulmonary interstitial edema bilateral pleural effusions. Interstitial edema is progressed from prior exam. 3. Right infrahilar mass again noted. Electronically Signed   By: Marcello Moores  Register   On: 12/18/2016 14:00    IMAGING/STUDIES: PFT 04/22/12: FVC 1.90 L (67%) FEV1 1.14 L (47%) FEV1/FVC 0.60 FEF 25-75 0.44 L (70%) negative bronchodilator response TLC 4.09 L (75%) RV 100% ERV 30% DLCO uncorrected 63%  CT CHEST W/O 12/08/16:  Endobronchial obstruction with cut off in the right lower lobe and some evidence of obstruction of the right middle lobe bronchus. Subsequent collapse versus consolidation of right lower lobe and associated pleural effusion. Small left pleural effusion as well. Left-sided nodule which is spiculated. Pathologically enlarged subcarinal as well as one precarinal lymph node. Apical predominant emphysematous  changes noted. 3/19 bronchoscopy w/ endobronchial FNA of bronchus intermedius mass, brushing of RMSB & EBUS w/ FNA  CXR 3/20 > persistent lung mass, worsening opacities concerning for pulmonary edema CXR 3/21> persistent lung mass, slightly improved opacities. I have reviewed all images personally.  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 Obesity  Probable OSA.  Tobacco abuse  Decompensation earlier this week is likely due to combination of lung collapse, post obstructive PNA, sedating meds, pulmonary  edema. She continues to improve.  - Encourage patient to use Bipap at night - Continue lasix for diuresis - Avoid sedating meds - Ok to stop antiobiotics if she does not need for intrabdominal infection. Pct is low and she has already received > 14 days of therapy. - Continue nebs, can change solumedrol to 60 mg prednisone and start taper by 10 mg every 2 days. - Onc on board. Appreciate reccs. Will need to consult to radiation oncology and IR if they can biopsy axillary LNs.  Acute appendicitis with perforation and peritoneal abscess (culture + for Enterococcus Faecalis) Intraperitoneal abscess s/p perc drainage 12/14/2016 - Management per surgery  Hypertension - Continue Norvasc, lopressor, cozaar - Lasix for diuresis.  She is Ok to transfer out of ICU from our perspective.  PCCM will be available as needed. Please call with any questions.  Marshell Garfinkel MD [facility] Pager [phone] If no answer or after 3pm call: [phone] 12/20/2016, 9:18 AM

## 2016-12-20 NOTE — Progress Notes (Signed)
3 Days Post-Op  Subjective: She looks pretty good this AM.  Moving in bed more and complaining more.  Good diuresis with lasix.  She is having expiratory wheezing more on left than right.  BS down in both base now that I can get to her back.  Sats OK on [place].  Drainage from the right drain is still feculent.    Objective: Vital signs in last 24 hours: Temp:  [98.3 F (36.8 C)-99.1 F (37.3 C)] 98.3 F (36.8 C) (03/21 2000) Pulse Rate:  [76-81] 76 (03/22 0326) Resp:  [14-24] 18 (03/22 0400) BP: (130-176)/(42-67) 130/49 (03/22 0400) SpO2:  [89 %-100 %] 94 % (03/22 0400) FiO2 (%):  [40 %] 40 % (03/21 2327) Weight:  [123.2 kg (271 lb 9.7 oz)] 123.2 kg (271 lb 9.7 oz) (03/22 0500) Last BM Date: 12/18/16 PO not recorded 580 IV 3875 urine Afebrile, VSS  O2 sats 91-96% on [place] [address] yesterday =>> 123.2 this Am Creatinine is normal 0.92 K+ 4.5 Prealbumin 14.7 CXR 3/21:  Central catheter tip in superior vena cava. No pneumothorax. Findings indicative of a degree of congestive heart failure. Superimposed bibasilar pneumonia cannot be excluded radiographically. Pneumonia and alveolar edema may present concurrently. There is aortic atherosclerosis.   Intake/Output from previous day: 03/21 0701 - 03/22 0700 In: 580 [I.V.:180; IV Piggyback:400] Out: 3880 [Urine:3875; Drains:5] Intake/Output this shift: No intake/output data recorded.  General appearance: alert, cooperative, no distress and very alert and moving well in bed this AM.   Resp: Down in both bases, some expiratory wheezing more on left than the right.   GI: No real change, sore around drain, right drain is still feculent.  left drain is clear. Extremities: ongoing edema both lower legs, back and lower trunk.  Lab Results:   Recent Labs  12/19/16 1006 12/20/16 0500  WBC 10.1 11.9*  HGB 10.1* 9.8*  HCT 31.6* 30.4*  PLT 229 213    BMET  Recent Labs  12/19/16 1006 12/20/16 0500  NA 140 135  K 3.8 4.5  CL 100* 88*   CO2 35* 42*  GLUCOSE 109* 112*  BUN 14 21*  CREATININE 0.63 0.92  CALCIUM 7.4* 8.3*   PT/INR No results for input(s): LABPROT, INR in the last 72 hours.   Recent Labs Lab 12/15/16 0452 12/19/16 1006  AST 10* 13*  ALT 9* 11*  ALKPHOS 57 63  BILITOT 0.8 0.5  PROT 4.8* 5.1*  ALBUMIN 1.9* 2.1*     Lipase     Component Value Date/Time   LIPASE 15 12/03/2016 1613     Studies/Results: Dg Chest Port 1 View  Result Date: 12/19/2016 CLINICAL DATA:  Respiratory distress EXAM: PORTABLE CHEST 1 VIEW COMPARISON:  December 18, 2016 FINDINGS: Central catheter tip is in the superior vena cava. No pneumothorax. There is airspace consolidation in each lower lobe with small pleural effusions bilaterally. There is cardiomegaly with mild pulmonary venous hypertension. No adenopathy. There is atherosclerotic calcification in the aorta. No bone lesions. IMPRESSION: Central catheter tip in superior vena cava. No pneumothorax. Findings indicative of a degree of congestive heart failure. Superimposed bibasilar pneumonia cannot be excluded radiographically. Pneumonia and alveolar edema may present concurrently. There is aortic atherosclerosis. Electronically Signed   By: Lowella Grip III M.D.   On: 12/19/2016 08:56   Dg Chest Port 1 View  Result Date: 12/18/2016 CLINICAL DATA:  Low oxygen saturation. EXAM: PORTABLE CHEST 1 VIEW COMPARISON:  12/17/2016.  CT 12/08/2016. FINDINGS: Right PICC line noted with  tip projected over the right atrium. Cardiomegaly with pulmonary vascular prominence, bilateral interstitial prominence, bilateral pleural effusions. Findings consistent CHF. Interim slight progression from prior exam. Right infrahilar mass again noted. IMPRESSION: 1. Right PICC line in stable position. 2. Congestive heart failure bilateral pulmonary interstitial edema bilateral pleural effusions. Interstitial edema is progressed from prior exam. 3. Right infrahilar mass again noted. Electronically Signed    By: Marcello Moores  Register   On: 12/18/2016 14:00    Medications: . sodium chloride   Intravenous Once  . amLODipine  10 mg Oral Daily  . arformoterol  15 mcg Nebulization Q12H  . budesonide (PULMICORT) nebulizer solution  0.5 mg Nebulization Q12H  . chlorhexidine  15 mL Mouth Rinse BID  . cholecalciferol  1,000 Units Oral Daily  . citalopram  10 mg Oral Daily  . enoxaparin (LOVENOX) injection  40 mg Subcutaneous Q24H  . ertapenem  1 g Intravenous Q24H  . fluconazole  200 mg Oral Daily  . furosemide  40 mg Intravenous Q12H  . losartan  100 mg Oral Daily   And  . hydrochlorothiazide  25 mg Oral Daily  . ipratropium-albuterol  3 mL Nebulization TID  . lip balm  1 application Topical BID  . mouth rinse  15 mL Mouth Rinse q12n4p  . methylPREDNISolone (SOLU-MEDROL) injection  60 mg Intravenous Q12H  . metoprolol  150 mg Oral Daily  . pantoprazole  40 mg Oral Q1200  . polyethylene glycol  17 g Oral Daily  . saccharomyces boulardii  250 mg Oral BID  . sodium chloride flush  3 mL Intravenous Q12H  . vancomycin  1,000 mg Intravenous Q12H    Assessment/Plan Perforated appendicitis s/p CT guided pelvic drain placement 12/04/16  s/p CT scan and IR drain placed 12/14/16, left trans gluteal approach  CT chest 12/03/16 =>>RLL mass =>>  Bronchus Intermedius Endobronchial Obstruction S/p Bronchoscopy with Airway Inspection,Endobronchial Fine Needle Aspiration Bronchus Intermedius Mass Endobronchial Brushings Right Mainstem Bronchus Endobronchial Ultrasound with Needle Aspiration 12/17/16, DR. Nestor BRONCHIAL BRUSHING, RIGHT MAINSTEM(SPECIMEN 4 OF 4 COLLECTED 12/17/16): SQUAMOUS CELL CARCINOMA.  Over sedation on anxiolytics with respiratory distress - Bipap 12/18/16 COPD - on O2 at home Probable OSA Recent pneumonia - earlier this year Former smoker - quit earlier this year  Hospital rash - Concerns with CIPRO/Flagyl and Zosyn allergies  Anasarca/ very positive fluid balance  - fluid overload   - lasix 40 mg q12h Malnutrition - Prealbumin 14.7 HTN -  Body mass index is 42.5  FEN:No IV fluids/soft diet ID:day 16 antibiotics Zosyn 12/03/16-12/05/16; Cipro/flagyl 3/8-3/10/18; Both stopped for rash/possible allergy Invanz 3/10 =>>day 12 Vancomycin 3/09=>>day14  Diflucan 3/18 =>> day 5 She did not get Invanz or diflucan 1 day during this course.  DVT: Lovenox    Plan:  She is still on Invanz, vancomycin and Diflucan per CCM recommendation for pulmonary issues.  I would continue diuresis her weight was 102 KG on admit, down to 123 from 126 with current diuresis.  We plan to get a repeat CT scan tomorrow with injection of the drain to evaluate for a probable fistula.  Will defer pulmonary care to CCM.      LOS: 17 days    Xyler Terpening 12/20/2016 [phone]

## 2016-12-20 NOTE — Progress Notes (Signed)
CSW continues to follow to assist with d/c planning. CSW met with pt / sister at bedside to offer support / reassurance. Pt is hoping to have rehab at [facility] ) when stable for d/c. Pt is not yet ready for dc. CSW will keep [facility] ( [facility] ) updated.  Werner Lean LCSW [phone]

## 2016-12-20 NOTE — Progress Notes (Signed)
Referring Physician(s):  Dr. Marlou Starks  Supervising Physician: Daryll Brod  Patient Status:  [facility] - In-pt  Chief Complaint:  Pelvic Abscess, lymphadenopathy  Subjective: Remains in ICU but is alert and overall feeling much better.   Allergies: Ciprofloxacin; Flagyl [metronidazole]; and Zosyn [piperacillin sod-tazobactam so]  Medications: Prior to Admission medications   Medication Sig Start Date End Date Taking? Authorizing Provider  ADVAIR DISKUS 250-50 MCG/DOSE AEPB Inhale 1 puff into the lungs Twice daily. 03/10/12  Yes Historical Provider, MD  albuterol (PROVENTIL HFA;VENTOLIN HFA) 108 (90 Base) MCG/ACT inhaler Inhale 2 puffs into the lungs every 6 (six) hours as needed for wheezing or shortness of breath.   Yes Historical Provider, MD  ALPRAZolam Duanne Moron) 0.5 MG tablet Take 0.5 mg by mouth 3 (three) times daily as needed for anxiety.  11/21/16  Yes Historical Provider, MD  amLODipine (NORVASC) 10 MG tablet Take 10 mg by mouth daily. 09/11/16  Yes Historical Provider, MD  Cholecalciferol (VITAMIN D PO) Take 4,000-5,000 Units by mouth daily.   Yes Historical Provider, MD  citalopram (CELEXA) 10 MG tablet Take 10 mg by mouth daily. 11/21/16  Yes Historical Provider, MD  losartan-hydrochlorothiazide (HYZAAR) 100-25 MG per tablet Take 1 tablet by mouth daily. 05/04/14  Yes Belva Crome, MD  metoprolol (LOPRESSOR) 100 MG tablet Take 1.5 tablets (150 mg total) by mouth daily. 07/14/13  Yes Belva Crome, MD  amLODipine (NORVASC) 5 MG tablet Take 1 tablet (5 mg total) by mouth daily. Patient not taking: Reported on 12/03/2016 07/14/13   Belva Crome, MD     Vital Signs: BP (!) 155/52 (BP Location: Left Arm)   Pulse 76   Temp 99 F (37.2 C) (Core (Comment))   Resp (!) 23   Ht '5\' 7"'$  (1.702 m)   Wt 271 lb 9.7 oz (123.2 kg)   SpO2 (!) 88%   BMI 42.54 kg/m   Physical Exam  Alert, NAD, oriented Heart:  Regular rate and rhythm Chest/lungs: clear to auscultation bilaterally Abd:  RLQ  drain intact with continued thick, brown, odorous output.  Left transfluteal drain intact with small amount of sero-sanguinous output.  Mild tenderness to palpation over drain sites.  Imaging: Dg Chest Port 1 View  Result Date: 12/19/2016 CLINICAL DATA:  Respiratory distress EXAM: PORTABLE CHEST 1 VIEW COMPARISON:  December 18, 2016 FINDINGS: Central catheter tip is in the superior vena cava. No pneumothorax. There is airspace consolidation in each lower lobe with small pleural effusions bilaterally. There is cardiomegaly with mild pulmonary venous hypertension. No adenopathy. There is atherosclerotic calcification in the aorta. No bone lesions. IMPRESSION: Central catheter tip in superior vena cava. No pneumothorax. Findings indicative of a degree of congestive heart failure. Superimposed bibasilar pneumonia cannot be excluded radiographically. Pneumonia and alveolar edema may present concurrently. There is aortic atherosclerosis. Electronically Signed   By: Lowella Grip III M.D.   On: 12/19/2016 08:56   Dg Chest Port 1 View  Result Date: 12/18/2016 CLINICAL DATA:  Low oxygen saturation. EXAM: PORTABLE CHEST 1 VIEW COMPARISON:  12/17/2016.  CT 12/08/2016. FINDINGS: Right PICC line noted with tip projected over the right atrium. Cardiomegaly with pulmonary vascular prominence, bilateral interstitial prominence, bilateral pleural effusions. Findings consistent CHF. Interim slight progression from prior exam. Right infrahilar mass again noted. IMPRESSION: 1. Right PICC line in stable position. 2. Congestive heart failure bilateral pulmonary interstitial edema bilateral pleural effusions. Interstitial edema is progressed from prior exam. 3. Right infrahilar mass again noted. Electronically Signed  By: [place]   On: 12/18/2016 14:00   Dg Chest Port 1 View  Result Date: 12/17/2016 CLINICAL DATA:  Status post bronchoscopy. EXAM: PORTABLE CHEST 1 VIEW COMPARISON:  CT chest 12/08/2016 FINDINGS: A  right-sided PICC line is stable. The heart is enlarged. Atherosclerotic changes are present at the aortic arch. Mild pulmonary vascular congestion is present. Right greater than left pleural effusions are noted. Bibasilar airspace disease is associated. IMPRESSION: 1. Cardiomegaly and mild pulmonary vascular congestion. 2. Persistent bilateral pleural effusions, right greater than left. 3. Partial collapse of the right lower lobe in part related to central soft tissue mass. 4. No radiographic evidence for complication. Electronically Signed   By: San Morelle M.D.   On: 12/17/2016 10:08    Labs:  CBC:  Recent Labs  12/15/16 0452 12/17/16 0450 12/19/16 1006 12/20/16 0500  WBC 10.4 13.0* 10.1 11.9*  HGB 10.6* 11.3* 10.1* 9.8*  HCT 32.0* 34.7* 31.6* 30.4*  PLT 352 336 229 213    COAGS:  Recent Labs  12/04/16 1005  INR 1.15    BMP:  Recent Labs  12/17/16 0450 12/18/16 0458 12/19/16 0358 12/19/16 1006 12/20/16 0500  NA 140 139  --  140 135  K 4.3 4.3  --  3.8 4.5  CL 101 98*  --  100* 88*  CO2 34* 36*  --  35* 42*  GLUCOSE 95 120*  --  109* 112*  BUN 9 11  --  14 21*  CALCIUM 8.1* 8.4*  --  7.4* 8.3*  CREATININE 0.61 0.70 0.68 0.63 0.92  GFRNONAA >60 >60 >60 >60 >60  GFRAA >60 >60 >60 >60 >60    LIVER FUNCTION TESTS:  Recent Labs  12/03/16 1613 12/15/16 0452 12/19/16 1006  BILITOT 0.7 0.8 0.5  AST 19 10* 13*  ALT 13* 9* 11*  ALKPHOS 98 57 63  PROT 6.7 4.8* 5.1*  ALBUMIN 2.8* 1.9* 2.1*    Assessment and Plan: Perforated appendicitis with multiple intra-abdominal abscesses s/p RLQ drain placed 12/04/16.   Drains remain in place.  Planning for CT scan with drain injection today to evaluation for fistula.   Continue routine drain care.  Plans per surgery and critical care.   Lymphadenopathy, right axillary identified on CT chest. Patient recently underwent bronch which revealed squamous cell carcinoma.  CT chest noted right axillary  lymphadenopathy.  IR consulted for possible lymph node biopsy at the request of Dr. Rafael Bihari.  Case reviewed by Dr. Annamaria Boots who feels lymph node biopsy is possible, but may have low yield.  Discussed with patient and family who wish to proceed with biopsy. Risks and Benefits discussed with the patient including, but not limited to bleeding, infection, damage to adjacent structures or low yield requiring additional tests. All of the patient's questions were answered, patient is agreeable to proceed. Consent signed and in chart. Anticipate procedure tomorrow.  Electronically Signed: Docia Barrier 12/20/2016, 1:34 PM   I spent a total of 25 Minutes at the the patient's bedside AND on the patient's hospital floor or unit, greater than 50% of which was counseling/coordinating care for perforated appendicitis, lymphadenopathy

## 2016-12-20 NOTE — Progress Notes (Signed)
Date:  December 20, 2016 Chart reviewed for concurrent status and case management needs. Will continue to follow patient progress./pccm following for new findings of a rt bronchial mass/o2 at 40% and prn bipap. Discharge Planning: following for needs Expected discharge date: [id number] Velva Harman, BSN, [place], [place]

## 2016-12-21 ENCOUNTER — Inpatient Hospital Stay (HOSPITAL_COMMUNITY): Payer: Medicare Other

## 2016-12-21 ENCOUNTER — Ambulatory Visit
Admit: 2016-12-21 | Discharge: 2016-12-21 | Disposition: A | Discharge status: Home / Self Care | Payer: Medicare Other | Attending: Radiation Oncology | Admitting: Radiation Oncology

## 2016-12-21 ENCOUNTER — Encounter (HOSPITAL_COMMUNITY): Payer: Self-pay | Admitting: Interventional Radiology

## 2016-12-21 DIAGNOSIS — F17200 Nicotine dependence, unspecified, uncomplicated: Secondary | ICD-10-CM

## 2016-12-21 DIAGNOSIS — C3431 Malignant neoplasm of lower lobe, right bronchus or lung: Secondary | ICD-10-CM

## 2016-12-21 DIAGNOSIS — J9621 Acute and chronic respiratory failure with hypoxia: Secondary | ICD-10-CM

## 2016-12-21 DIAGNOSIS — J9622 Acute and chronic respiratory failure with hypercapnia: Secondary | ICD-10-CM

## 2016-12-21 DIAGNOSIS — L988 Other specified disorders of the skin and subcutaneous tissue: Secondary | ICD-10-CM

## 2016-12-21 DIAGNOSIS — R591 Generalized enlarged lymph nodes: Secondary | ICD-10-CM

## 2016-12-21 DIAGNOSIS — I10 Essential (primary) hypertension: Secondary | ICD-10-CM

## 2016-12-21 DIAGNOSIS — K651 Peritoneal abscess: Secondary | ICD-10-CM

## 2016-12-21 HISTORY — PX: IR GENERIC HISTORICAL: IMG1180011

## 2016-12-21 LAB — GLUCOSE, CAPILLARY
GLUCOSE-CAPILLARY: 111 mg/dL — AB (ref 65–99)
GLUCOSE-CAPILLARY: 82 mg/dL (ref 65–99)
GLUCOSE-CAPILLARY: 103 mg/dL — AB (ref 65–99)
Glucose-Capillary: 152 mg/dL — ABNORMAL HIGH (ref 65–99)

## 2016-12-21 LAB — CBC
HCT: 30.1 % — ABNORMAL LOW (ref 36.0–46.0)
HEMOGLOBIN: 9.7 g/dL — AB (ref 12.0–15.0)
MCH: 30.9 pg (ref 26.0–34.0)
MCHC: 32.2 g/dL (ref 30.0–36.0)
MCV: 95.9 fL (ref 78.0–100.0)
PLATELETS: 207 10*3/uL (ref 150–400)
RBC: 3.14 MIL/uL — ABNORMAL LOW (ref 3.87–5.11)
RDW: 14.7 % (ref 11.5–15.5)
WBC: 12.1 10*3/uL — ABNORMAL HIGH (ref 4.0–10.5)

## 2016-12-21 LAB — BASIC METABOLIC PANEL
Anion gap: 5 (ref 5–15)
BUN: 27 mg/dL — AB (ref 6–20)
CHLORIDE: 86 mmol/L — AB (ref 101–111)
CO2: 45 mmol/L — ABNORMAL HIGH (ref 22–32)
Calcium: 8.4 mg/dL — ABNORMAL LOW (ref 8.9–10.3)
Creatinine, Ser: 0.8 mg/dL (ref 0.44–1.00)
GFR calc Af Amer: >60 mL/min (ref >60)
GFR calc non Af Amer: >60 mL/min (ref >60)
GLUCOSE: 87 mg/dL (ref 65–99)
POTASSIUM: 4.2 mmol/L (ref 3.5–5.1)
Sodium: 136 mmol/L (ref 135–145)

## 2016-12-21 LAB — PROCALCITONIN: Procalcitonin: 0.14 ng/mL

## 2016-12-21 MED ORDER — IOPAMIDOL (ISOVUE-300) INJECTION 61%
INTRAVENOUS | Status: AC
  Filled 2016-12-21: qty 300

## 2016-12-21 MED ORDER — LIDOCAINE HCL 1 % IJ SOLN
INTRAMUSCULAR | Status: DC | PRN
  Administered 2016-12-21: 5 mL

## 2016-12-21 MED ORDER — IOPAMIDOL (ISOVUE-300) INJECTION 61%
20.0000 mL | Freq: Once | INTRAVENOUS | Status: AC | PRN
  Administered 2016-12-21: 20 mL

## 2016-12-21 MED ORDER — TRAMADOL HCL 50 MG PO TABS
25.0000 mg | ORAL_TABLET | Freq: Four times a day (QID) | ORAL | Status: DC | PRN
  Administered 2016-12-21: 50 mg via ORAL
  Filled 2016-12-21: qty 1

## 2016-12-21 MED ORDER — FLUMAZENIL 0.5 MG/5ML IV SOLN
INTRAVENOUS | Status: AC
  Filled 2016-12-21: qty 5

## 2016-12-21 MED ORDER — IOPAMIDOL (ISOVUE-300) INJECTION 61%
INTRAVENOUS | Status: AC
  Administered 2016-12-21: 100 mL
  Filled 2016-12-21: qty 100

## 2016-12-21 MED ORDER — LIDOCAINE HCL 1 % IJ SOLN
INTRAMUSCULAR | Status: AC
  Filled 2016-12-21: qty 20

## 2016-12-21 MED ORDER — IOPAMIDOL (ISOVUE-300) INJECTION 61%
INTRAVENOUS | Status: AC
  Administered 2016-12-21: 20 mL
  Filled 2016-12-21: qty 50

## 2016-12-21 MED ORDER — MIDAZOLAM HCL 2 MG/2ML IJ SOLN
INTRAMUSCULAR | Status: AC
  Filled 2016-12-21: qty 4

## 2016-12-21 MED ORDER — FENTANYL CITRATE (PF) 100 MCG/2ML IJ SOLN
INTRAMUSCULAR | Status: AC
  Filled 2016-12-21: qty 4

## 2016-12-21 MED ORDER — NALOXONE HCL 0.4 MG/ML IJ SOLN
INTRAMUSCULAR | Status: AC
  Filled 2016-12-21: qty 1

## 2016-12-21 NOTE — Procedures (Signed)
Interventional Radiology Procedure Note  Procedure: US guided biopsy of right axillary lymph node.    Complications: none  Recommendations:  - Follow up pathology - Do not submerge for 7 days - Routine care   Signed,  Dulcy Fanny. Earleen Newport, DO

## 2016-12-21 NOTE — Progress Notes (Signed)
Physical Therapy Treatment Patient Details Name: Alisha Fisher MRN: [id number] DOB: 12/05/1945 Today's Date: 12/21/2016    History of Present Illness Alisha Fisher is a 71 y.o. female ,former smoker, with history of hypertension, COPD, who presented to ED 12/03/16 with 3 day history of right lower quadrant abdominal pain. Subsequent imaging revealed. Perforated appendicitis. 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    PT Comments    The patient C/o left thigh numbness and burning. Assisted with 2 persons for safety to recliner. Oxygen saturation dropped to 85% on 4 L.  Returned to 90% with rest. Continue PT.  Follow Up Recommendations  SNF;Supervision/Assistance - 24 hour     Equipment Recommendations  Rolling walker with 5" wheels    Recommendations for Other Services       Precautions / Restrictions Precautions Precautions: Fall Precaution Comments: Bil drains, on home oxygen, incontinence B/B    Mobility  Bed Mobility Overal bed mobility: Needs Assistance Bed Mobility: Supine to Sit     Supine to sit: Mod assist     General bed mobility comments: mod assist for uper body to  sit upright  Transfers Overall transfer level: Needs assistance Equipment used: Rolling walker (2 wheeled) Transfers: Sit to/from [facility] Sit to Stand: +2 physical assistance;+2 safety/equipment;Mod assist Stand pivot transfers: +2 physical assistance;+2 safety/equipment;Mod assist       General transfer comment: assist to rise from the bed, pivot steps to recliner., cues to reach back, oxygen sats 94-85% on 4 L. with activity.  Ambulation/Gait                 Stairs            Wheelchair Mobility    Modified Rankin (Stroke Patients Only)       Balance                                    Cognition Arousal/Alertness: Awake/alert Behavior During Therapy: Anxious                    General Comments: does not complete sentences, halting conversation, at times vague about complaints.    Exercises      General Comments        Pertinent Vitals/Pain Faces Pain Scale: Hurts even more Pain Location: lt thigh, reports numbness in thigh Pain Descriptors / Indicators: Burning;Aching;Throbbing Pain Intervention(s): Monitored during session;Premedicated before session    Home Living                      Prior Function            PT Goals (current goals can now be found in the care plan section) Acute Rehab PT Goals Patient Stated Goal: to feel better. Time For Goal Achievement: 01/04/17 Potential to Achieve Goals: Good Progress towards PT goals: Goals downgraded-see care plan    Frequency    Min 3X/week      PT Plan Current plan remains appropriate    Co-evaluation             End of Session Equipment Utilized During Treatment: Oxygen Activity Tolerance: Patient limited by fatigue;Other (comment) Patient left: in chair;with call bell/phone within reach;with chair alarm set Nurse Communication: Mobility status PT Visit Diagnosis: Pain;Unsteadiness on feet (R26.81) Pain - Right/Left: Left Pain - part of body:  Leg     Time: 0822-0902 PT Time Calculation (min) (ACUTE ONLY): 40 min  Charges:  $Therapeutic Activity: 38-52 mins                    G CodesTresa Fisher PT [phone]  Alisha Fisher 12/21/2016, 9:58 AM

## 2016-12-21 NOTE — Progress Notes (Signed)
MEDICATION-RELATED CONSULT NOTE   IR Procedure Consult - Anticoagulant/Antiplatelet PTA/Inpatient Med List Review by Pharmacist    Procedure: biopsy of right axillary lymph node    Completed: 3/23 @ 9539  Post-Procedural bleeding risk per IR MD assessment:  low  Antithrombotic medications on inpatient or PTA profile prior to procedure:    enoxaparin   Recommended restart time per IR Post-Procedure Guidelines:   At least 4 hours or at next standard dose interval  Other considerations:      Plan:     MD re-started enoxaparin starting tomorrow am> continue  Dolly Rias RPh 12/21/2016, 3:15 PM Pager [phone]

## 2016-12-21 NOTE — Progress Notes (Signed)
[facility] Therapy Treatment Record Phone [phone]   DCVUDTHYH Therapy was administered to Alisha Fisher on: 12/21/2016  2:51 PM and was treatment # 1 out of a planned course of 10 treatments.  Radiation Treatment  1). Beam photons with 6-10 energy  2). Brachytherapy None  3). Stereotactic Radiosurgery None  4). Other Radiation None     Gwin Eagon, RT (T)

## 2016-12-21 NOTE — Consult Note (Signed)
[facility] Medical Consultation  Alisha Fisher SAY:[id number] DOB: 1945-11-20 DOA: 12/03/2016 PCP: Pcp Not In System   Requesting physician: Earnstine Regal, PA, Surgery Date of consultation: 12/21/2016 Reason for consultation: COPD, hypertension  HPI:  This is a 71 year old female with history of hypertension, COPD on chronic home oxygen 2 L nasal cannula, obesity, tobacco abuse, who was admitted on the surgical service on 12/03/2016 with abdominal pain, found to have a ruptured appendix and peritonitis and taken to the operating room.  Initial investigation showed a right lung mass with subsequent endobronchial obstruction with in the right lower lobe, as well as lymphadenopathy.  Pulmonology was consulted, and patient underwent a bronchoscopy on 3/19 with endobronchial FNA, with pathology showing new diagnosis of squamous cell carcinoma.  Oncology was consulted, as well as radiation oncology and there are plans to irradiate this mass since lung collapse puts her at high risk for developing recurrent postobstructive pneumonia.  During her initial surgery, she underwent drain placement into right side of her abdomen and she had culture from the intra-abdominal abscess, which showed enterococcus faecalis which was sensitive to ampicillin, and she completed a course of antibiotics as below.  She had hypoxic and hypercarbic respiratory failure, requiring BiPAP use, and her hospital course was complicated by significant fluid overload for which she is on IV diuresis currently.   Interventional radiology was also consulted to evaluate patient for biopsy of her lymph nodes to evaluate for metastatic disease  Today, patient feels short of breath with minimal activity, she denies any chest pain, she denies any abdominal pain, no nausea vomiting.  She is able to tolerate a soft diet.  She is complaining of left lateral thigh numbness, occasional over the last several weeks, in the setting of chronic back  pain.  Imaging/studies:  CT CHEST W/O 12/08/16: Endobronchial obstruction with cut off in the right lower lobe and some evidence of obstruction of the right middle lobe bronchus. Subsequent collapse versus consolidation of right lower lobe and associated pleural effusion. Small left pleural effusion as well. Left-sided nodule which is spiculated. Pathologically enlarged subcarinal as well as one precarinal lymph node. Apical predominant emphysematous changes noted. 3/19 bronchoscopy w/ endobronchial FNA of bronchus intermedius mass, brushing of RMSB & EBUS w/ FNA CXR 3/20 > persistent lung mass, worsening opacities concerning for pulmonary edema CXR 3/21> persistent lung mass, slightly improved opacities. CT abdomen pelvis 3/23  ANTIBIOTICS: Zosyn 3/5 - 3/8 Cipro 3/8 (x1 dose) Flagyl 3/8 - 3/10 Vancomycin 3/9 >>> 3/22 Invanz 3/10 >>>3/22  Review of Systems:  As per HPI, otherwise 10 point review of systems negative   Impression/Recommendations Principal Problem:   Acute appendicitis with perforation and peritoneal abscess Active Problems:   Obesity   Smoker   COPD (chronic obstructive pulmonary disease) ([place])   Intraperitoneal abscess s/p perc drainage 12/14/2016   Hypokalemia   Hypomagnesemia   Endobronchial mass   Acute on chronic respiratory failure with hypoxia and hypercapnia (HCC)   Essential hypertension   Anemia    Acute on chronic hypercarbic and hypoxic respiratory failure -Multifactorial due to COPD, fluid overload, suspect a component of acute on chronic diastolic CHF, currently her respiratory status is stable, she has no wheezing, and she is satting in the upper 90s on 4 L nasal cannula.  Wean off oxygen as tolerated  COPD /suspect obstructive sleep apnea -Pulmonology was consulted and have evaluated patient.   -Continue nightly BiPAP -Currently she is on Brovana, Pulmicort, DuoNeb's as well as a prednisone  taper  Acute appendicitis with  perforation/peritoneal abscess -Initial cultures grew Enterococcus faecalis, patient has completed more than 2 weeks of antibiotics, antibiotics were discontinued yesterday, she is afebrile, repeat CT scan of the abdomen and pelvis is pending.  CT scan will be with drain injection today to evaluate for fistula -Rest of management per primary service -IR plans to biopsy lymph nodes 3/23  Newly diagnosed non-small cell lung cancer, squamous cell carcinoma of the right upper lobe -Pulmonology consulted as above, status post bronchoscopy on 3/19 with positive FNA -Radiation oncology and oncology are following -We will complete the staging with an MRI of the brain -She will undergo radiation therapy to the mass, she will go for simulation on 12/22/78  Probable acute on chronic diastolic CHF with pulmonary edema causing hypoxia and anasarca -We will obtain a 2D echo today -continue IV Lasix as you are doing, strict I's and O's and daily weights -weight 225 on admission, now 278 (highest) >> 272  Hypertension -Blood pressure is well controlled this morning at 129/71 -Currently on Norvasc, Lasix, Cozaar, HCTZ and metoprolol.  Continue.  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  -avoid narcotics given respiratory status, Tramadol PRN     Suspect she will need SNF on d/c    Hospitalist team will followup again tomorrow. Please contact me if I can be of assistance in the meanwhile. Thank you for this consultation.   Past Medical  History:  Diagnosis Date  . COPD, severe ([place])   . HTN (hypertension)   . Pneumonia   . Pulmonary emphysema ([place])   . SOB (shortness of breath)    Past Surgical History:  Procedure Laterality Date  . CATARACT EXTRACTION    . ENDOBRONCHIAL ULTRASOUND Bilateral 12/17/2016   Procedure: ENDOBRONCHIAL ULTRASOUND;  Surgeon: Javier Glazier, MD;  Location: [facility] ENDOSCOPY;  Service: Cardiopulmonary;  Laterality: Bilateral;   Social History:  reports that she quit smoking about 7 weeks ago. Her smoking use included Cigarettes. She has a 25.00 pack-year smoking history. She has never used smokeless tobacco. She reports that she does not drink alcohol or use drugs.  Allergies  Allergen Reactions  . Ciprofloxacin Rash    Rash possibly caused by Cipro?  . Flagyl [Metronidazole] Rash    Rash possibly caused by Flagyl?  Marland Kitchen Zosyn [Piperacillin Sod-Tazobactam So] Itching and Rash    Has patient had a PCN reaction causing immediate rash, facial/tongue/throat swelling, SOB or lightheadedness with hypotension: No Has patient had a PCN reaction causing severe rash involving mucus membranes or skin necrosis: No Has patient had a PCN reaction that required hospitalization: No Has patient had a PCN reaction occurring within the last 10 years: Yes If all of the above answers are "NO", then may proceed with Cephalosporin use.    Family History  Problem Relation Age of Onset  . Allergies    . COPD Cousin   . Breast cancer Mother   . Diabetes Father   . Diabetes Son     Prior to Admission medications   Medication Sig Start Date End Date  Taking? Authorizing Provider  ADVAIR DISKUS 250-50 MCG/DOSE AEPB Inhale 1 puff into the lungs Twice daily. 03/10/12  Yes Historical Provider, MD  albuterol (PROVENTIL HFA;VENTOLIN HFA) 108 (90 Base) MCG/ACT inhaler Inhale 2 puffs into the lungs every 6 (six) hours as needed for wheezing or shortness of breath.   Yes Historical Provider, MD  ALPRAZolam Duanne Moron) 0.5 MG  tablet Take 0.5 mg by mouth 3 (three) times daily as needed for anxiety.  11/21/16  Yes Historical Provider, MD  amLODipine (NORVASC) 10 MG tablet Take 10 mg by mouth daily. 09/11/16  Yes Historical Provider, MD  Cholecalciferol (VITAMIN D PO) Take 4,000-5,000 Units by mouth daily.   Yes Historical Provider, MD  citalopram (CELEXA) 10 MG tablet Take 10 mg by mouth daily. 11/21/16  Yes Historical Provider, MD  losartan-hydrochlorothiazide (HYZAAR) 100-25 MG per tablet Take 1 tablet by mouth daily. 05/04/14  Yes Belva Crome, MD  metoprolol (LOPRESSOR) 100 MG tablet Take 1.5 tablets (150 mg total) by mouth daily. 07/14/13  Yes Belva Crome, MD  amLODipine (NORVASC) 5 MG tablet Take 1 tablet (5 mg total) by mouth daily. Patient not taking: Reported on 12/03/2016 07/14/13   Belva Crome, MD   Physical Exam: Blood pressure (!) 144/59, pulse 76, temperature 99 F (37.2 C), temperature source Core (Comment), resp. rate 11, height '5\' 7"'$  (1.702 m), weight 123.4 kg (272 lb 0.8 oz), SpO2 98 %. Vitals:   12/21/16 0400 12/21/16 0600  BP: 140/61 (!) 144/59  Pulse:    Resp: 11 11  Temp:     Constitutional: NAD, calm, comfortable Eyes: PERRL, lids and conjunctivae normal ENMT: Mucous membranes are moist. .  Neck:  no masses, obese Respiratory: decreased breath sounds at the bases, no wheezing, crackles bibasilar Cardiovascular: Regular rate and rhythm, no murmurs / rubs / gallops. 3+ pitting lower extremity edema. 2+ pedal pulses. JVD could not be appreciated due to obesity Abdomen: mild tenderness, no masses palpated. Bowel sounds positive. RLQ drain in place Musculoskeletal: no clubbing / cyanosis. Normal muscle tone.  Skin: no rashes, lesions, ulcers. No induration Neurologic: CN 2-12 grossly intact. Strength 5/5 in all 4.  Psychiatric: Normal judgment and insight. Alert and oriented x 3. Normal mood.    Labs on Admission:  Basic Metabolic Panel:  Recent Labs Lab 12/15/16 0452 12/16/16 0457  12/17/16 0450 12/18/16 0458 12/19/16 0358 12/19/16 1006 12/20/16 0500 12/21/16 0400  NA 137 139 140 139  --  140 135 136  K 3.6 3.2* 4.3 4.3  --  3.8 4.5 4.2  CL 101 101 101 98*  --  100* 88* 86*  CO2 30 33* 34* 36*  --  35* 42* 45*  GLUCOSE 99 100* 95 120*  --  109* 112* 87  BUN '8 9 9 11  '$ --  14 21* 27*  CREATININE 0.65 0.56 0.61 0.70 0.68 0.63 0.92 0.80  CALCIUM 8.1* 8.0* 8.1* 8.4*  --  7.4* 8.3* 8.4*  MG 1.4* 1.7 1.9  --   --   --   --   --   PHOS 3.3 3.5  --   --   --   --   --   --    Liver Function Tests:  Recent Labs Lab 12/15/16 0452 12/19/16 1006  AST 10* 13*  ALT 9* 11*  ALKPHOS 57 63  BILITOT 0.8 0.5  PROT 4.8* 5.1*  ALBUMIN 1.9* 2.1*   No results for input(s): LIPASE, AMYLASE in the last 168 hours. No  results for input(s): AMMONIA in the last 168 hours. CBC:  Recent Labs Lab 12/15/16 0452 12/17/16 0450 12/19/16 1006 12/20/16 0500 12/21/16 0400  WBC 10.4 13.0* 10.1 11.9* 12.1*  NEUTROABS 8.6* 9.6*  --   --   --   HGB 10.6* 11.3* 10.1* 9.8* 9.7*  HCT 32.0* 34.7* 31.6* 30.4* 30.1*  MCV 93.3 94.6 96.9 95.9 95.9  PLT 352 336 229 213 207   Cardiac Enzymes: No results for input(s): CKTOTAL, CKMB, CKMBINDEX, TROPONINI in the last 168 hours. BNP: Invalid input(s): POCBNP CBG:  Recent Labs Lab 12/16/16 2009 12/17/16 0008 12/17/16 0447 12/21/16 0751 12/21/16 0800  GLUCAP 221* 141* 77 111* 82    Radiological Exams: No results found.  Marzetta Board [facility] Pager [phone]  If 7PM-7AM, please contact night-coverage [email or web address] Password [facility] 12/21/2016, 8:47 AM

## 2016-12-21 NOTE — Progress Notes (Signed)
4 Days Post-Op  Subjective: She is alert and looks pretty good.  Swelling is improved she had diureised about 8 liters over the last 3 days.   She has a full day scheduled with CT abd and pelvis, Ct stimulation, lymph node Bx today by IR.  Breathing looks much more comfortable.  Mild wheezing before her breathing Rx.    Objective: Vital signs in last 24 hours: Temp:  [99 F (37.2 C)] 99 F (37.2 C) (03/22 1200) Resp:  [11-24] 11 (03/23 0600) BP: (130-162)/(42-73) 144/59 (03/23 0600) SpO2:  [88 %-99 %] 96 % (03/23 0600) FiO2 (%):  [40 %] 40 % (03/23 0021) Weight:  [123.4 kg (272 lb 0.8 oz)] 123.4 kg (272 lb 0.8 oz) (03/23 0413) Last BM Date: 12/18/16  PO not recorded Urine 2825 Drains 15 ml Afebrile, VSS BMP OK WBC is 12.1 K NO films Weight 123 KG  Intake/Output from previous day: 03/22 0701 - 03/23 0700 In: -  Out: 2840 [Urine:2825; Drains:15] Intake/Output this shift: No intake/output data recorded.  General appearance: alert, cooperative and no distress Resp: clear to auscultation bilaterally and anterior, some expiratory wheezing posterior, decreased BS inthe bases. GI: soft, non-tender; bowel sounds normal; no masses,  no organomegaly and IR drains about the same almost nothing coming from both.  the right drain has fluid, can't tell if it is old or new.  It remains appearing feculent.  left drain is serosanguinsous..  What I am seeing Extremities: edema is better in both uppper and lower extremities. Rash is stable and perhaps better.  Lab Results:   Recent Labs  12/20/16 0500 12/21/16 0400  WBC 11.9* 12.1*  HGB 9.8* 9.7*  HCT 30.4* 30.1*  PLT 213 207    BMET  Recent Labs  12/20/16 0500 12/21/16 0400  NA 135 136  K 4.5 4.2  CL 88* 86*  CO2 42* 45*  GLUCOSE 112* 87  BUN 21* 27*  CREATININE 0.92 0.80  CALCIUM 8.3* 8.4*   PT/INR No results for input(s): LABPROT, INR in the last 72 hours.   Recent Labs Lab 12/15/16 0452 12/19/16 1006  AST 10*  13*  ALT 9* 11*  ALKPHOS 57 63  BILITOT 0.8 0.5  PROT 4.8* 5.1*  ALBUMIN 1.9* 2.1*     Lipase     Component Value Date/Time   LIPASE 15 12/03/2016 1613     Studies/Results: Dg Chest Port 1 View  Result Date: 12/19/2016 CLINICAL DATA:  Respiratory distress EXAM: PORTABLE CHEST 1 VIEW COMPARISON:  December 18, 2016 FINDINGS: Central catheter tip is in the superior vena cava. No pneumothorax. There is airspace consolidation in each lower lobe with small pleural effusions bilaterally. There is cardiomegaly with mild pulmonary venous hypertension. No adenopathy. There is atherosclerotic calcification in the aorta. No bone lesions. IMPRESSION: Central catheter tip in superior vena cava. No pneumothorax. Findings indicative of a degree of congestive heart failure. Superimposed bibasilar pneumonia cannot be excluded radiographically. Pneumonia and alveolar edema may present concurrently. There is aortic atherosclerosis. Electronically Signed   By: Lowella Grip III M.D.   On: 12/19/2016 08:56    Medications: . sodium chloride   Intravenous Once  . amLODipine  10 mg Oral Daily  . arformoterol  15 mcg Nebulization Q12H  . budesonide (PULMICORT) nebulizer solution  0.5 mg Nebulization Q12H  . chlorhexidine  15 mL Mouth Rinse BID  . cholecalciferol  1,000 Units Oral Daily  . citalopram  10 mg Oral Daily  . [START ON 12/22/2016]  enoxaparin (LOVENOX) injection  40 mg Subcutaneous Q24H  . furosemide  40 mg Intravenous Q12H  . losartan  100 mg Oral Daily   And  . hydrochlorothiazide  25 mg Oral Daily  . ipratropium-albuterol  3 mL Nebulization TID  . lip balm  1 application Topical BID  . mouth rinse  15 mL Mouth Rinse q12n4p  . metoprolol  150 mg Oral Daily  . pantoprazole  40 mg Oral Q1200  . polyethylene glycol  17 g Oral Daily  . predniSONE  60 mg Oral Q breakfast  . protein supplement shake  11 oz Oral Q24H  . saccharomyces boulardii  250 mg Oral BID  . sodium chloride flush  3 mL  Intravenous Q12H   No IV fluids currently  Assessment/Plan Perforated appendicitis s/p CT guided pelvic drain placement 12/04/16  s/p CT scan and IR drain placed 12/14/16, left trans gluteal approach  CT chest 12/03/16 =>>RLL mass =>>Bronchus Intermedius Endobronchial Obstruction S/p Bronchoscopy with Airway Inspection,Endobronchial Fine Needle Aspiration Bronchus Intermedius Mass Endobronchial Brushings Right Mainstem Bronchus Endobronchial Ultrasound with Needle Aspiration 12/17/16, DR. Nestor BRONCHIAL BRUSHING, RIGHT MAINSTEM(SPECIMEN 4 OF 4 COLLECTED 12/17/16): SQUAMOUS CELL CARCINOMA.  Over sedation on anxiolytics with respiratory distress - Bipap 12/18/16 COPD - on O2 at home Probable OSA Recent pneumonia - earlier this year Former smoker - quit earlier this year  Hospital rash - Concerns with CIPRO/Flagyl and Zosyn allergies  Anasarca/ very positive fluid balance - fluid overload - lasix 40 mg q12h Malnutrition - Prealbumin 14.7 HTN - Body mass index is 42.5  FEN:No IV fluids/soft diet =>> NPO for procedures ID:day 16antibiotics Zosyn 12/03/16-12/05/16; Cipro/flagyl 3/8-3/10/18; Both stopped for rash/possible allergy Invanz 3/10 =>>day 12Vancomycin 3/09=>>day14 Diflucan 3/18 =>>day 5 She did not get Invanz or diflucan 1 day during this course.  DVT: Lovenox      Plan:  CCM has completed her acute care.  We are going to ask Medicine to see and assist [place] in her care.  She is going to Radiology today for CT of abd/pelvis, Injection of her right drain to look for a fistula.  Appreciate CCM assistance.  Keep her in step down at least today. Transfer to step down today.         LOS: 18 days    Toshika Parrow 12/21/2016 [phone]

## 2016-12-21 NOTE — Procedures (Signed)
Interventional Radiology Procedure Note  HPI: 71 yo female with prior ruptured appendicitis.  Abscess drained with percutaneous drain.    Procedure: Drain injection under fluoro.   Findings: Injection demonstrates direct connection to the cecum via the appendiceal stump. Some contrast exited the body along the tract of the drain.       Complications: No immediate  Impression: My impression is that the previously placed drain was located at the terminal appendix which ruptured, and now the tip of appendix has contracted around the drain.  The current drain seems to be acting as a cecostomy tube.   Recommendations:  - Maintain current drain to gravity. - Discuss with surgery - Routine drain exchanges will be needed, as the current drain will likely become occluded.  Signed,  Dulcy Fanny. Earleen Newport, DO

## 2016-12-21 NOTE — Progress Notes (Signed)
Referring Physician(s): Toth,P  Supervising Physician: Corrie Mckusick  Patient Status:  [facility] - In-pt  Chief Complaint:  Pelvic abscesses  Subjective: Patient doing fairly well, no acute changes.   Allergies: Ciprofloxacin; Flagyl [metronidazole]; and Zosyn [piperacillin sod-tazobactam so]  Medications: Prior to Admission medications   Medication Sig Start Date End Date Taking? Authorizing Provider  ADVAIR DISKUS 250-50 MCG/DOSE AEPB Inhale 1 puff into the lungs Twice daily. 03/10/12  Yes Historical Provider, MD  albuterol (PROVENTIL HFA;VENTOLIN HFA) 108 (90 Base) MCG/ACT inhaler Inhale 2 puffs into the lungs every 6 (six) hours as needed for wheezing or shortness of breath.   Yes Historical Provider, MD  ALPRAZolam Duanne Moron) 0.5 MG tablet Take 0.5 mg by mouth 3 (three) times daily as needed for anxiety.  11/21/16  Yes Historical Provider, MD  amLODipine (NORVASC) 10 MG tablet Take 10 mg by mouth daily. 09/11/16  Yes Historical Provider, MD  Cholecalciferol (VITAMIN D PO) Take 4,000-5,000 Units by mouth daily.   Yes Historical Provider, MD  citalopram (CELEXA) 10 MG tablet Take 10 mg by mouth daily. 11/21/16  Yes Historical Provider, MD  losartan-hydrochlorothiazide (HYZAAR) 100-25 MG per tablet Take 1 tablet by mouth daily. 05/04/14  Yes Belva Crome, MD  metoprolol (LOPRESSOR) 100 MG tablet Take 1.5 tablets (150 mg total) by mouth daily. 07/14/13  Yes Belva Crome, MD  amLODipine (NORVASC) 5 MG tablet Take 1 tablet (5 mg total) by mouth daily. Patient not taking: Reported on 12/03/2016 07/14/13   Belva Crome, MD     Vital Signs: BP (!) 156/56   Pulse 76   Temp 97.7 F (36.5 C)   Resp 11   Ht '5\' 7"'$  (1.702 m)   Wt 272 lb 0.8 oz (123.4 kg)   SpO2 91%   BMI 42.61 kg/m   Physical Exam right lower quadrant and left transgluteal drains intact. Right lower quadrant drain output about 10 mL  feculent material; minimal output left pelvic drain Imaging: Ct Abdomen Pelvis W  Contrast  Result Date: 12/21/2016 CLINICAL DATA:  History of ruptured appendicitis, post percutaneous periappendiceal drainage catheter placement on 12/04/2016 and left trans gluteal approach percutaneous drainage catheter placement on 12/14/2016. EXAM: CT ABDOMEN AND PELVIS WITH CONTRAST TECHNIQUE: Multidetector CT imaging of the abdomen and pelvis was performed using the standard protocol following bolus administration of intravenous contrast. CONTRAST:  100 ISOVUE-300 IOPAMIDOL (ISOVUE-300) INJECTION 61% COMPARISON:  CT-guided periappendiceal abscess drainage catheter placement- 12/04/2016; left fibula approach percutaneous drainage catheter placement - 12/14/2016; CT abdomen pelvis -12/13/2016; 12/03/2016 ; chest CT- 11/14/2016 FINDINGS: Lower chest: Limited visualization of the lower thorax demonstrates chronic atelectasis / collapse of the right lower lobe. Interval increase in size of small left-sided effusion with complete atelectasis of the imaged portion of the left lower lobe. Interval increase in small right-sided pleural effusion. Cardiomegaly. Coronary artery calcifications. No pericardial effusion. Hepatobiliary: Mild nodularity hepatic contour. No discrete hepatic lesions. Normal appearance of the gallbladder given degree distention. No radiopaque gallstones. No intra extrahepatic bili duct dilatation. No ascites. Pancreas: Normal appearance of the pancreas Spleen: Normal appearance of the spleen. Note is made of a small splenule. Adrenals/Urinary Tract: There is symmetric enhancement and excretion of the bilateral kidneys. The left kidney appears mildly hypertrophied in comparison to the right. No definite renal stones this postcontrast examination. No discrete renal lesions. There is a minimal amount of likely age and body habitus related perinephric stranding. No urinary obstruction. Normal appearance the bilateral adrenal glands. Normal appearance of  the urinary bladder given underdistention  with a focal catheter. Stomach/Bowel: Partial retraction of Terry appendiceal abscess drainage catheter with radiopaque site marker now projecting external to the right abdominal musculature (image 67, series 2). The dominant component of the pigtail does remain within a residual 3.8 x 1.9 cm periappendiceal fluid collection (coronal image 54, series 5). Interval retraction of left trans gluteal approach percutaneous drainage catheter with radiopaque site marker now within in the central aspect of the left pelvic sidewall musculature (image 75, series 2). The tip of the pigtail drain does remain appropriately positioned within the post a vesicular space. There has been complete resolution of previously noted pelvic collection. A small amount of fluid is seen within the pelvic cul-de-sac without peripheral wall enhancement. Large colonic stool burden without evidence of enteric obstruction. The colon is again noted to be interposed about the liver edge normal appearance of the terminal ileum. No pneumoperitoneum, pneumatosis or portal venous gas. No new definable/ drainable fluid collections within the abdomen or pelvis. Vascular/Lymphatic: Large amount of irregular mixed calcified and noncalcified atherosclerotic plaque throughout the abdominal aorta. Suspected severe (at least 75%) luminal narrowing involving the right common iliac artery (axial image 58, series 2, coronal image 77, series 5), incompletely evaluated on this non CTA examination. The major branch vessels of the abdominal aorta appear patent on this non CTA examination. Scattered retroperitoneal, pelvic and inguinal lymph nodes are numerous though individually not enlarged by size criteria in presumed reactive due to patient body habitus. Reproductive: Normal appearance of the pelvic organs. No discrete adnexal lesion. Other: Large amount of body wall edema. Musculoskeletal: No acute or aggressive osseous abnormalities. Moderate DDD of L3-L4 with disc  space height loss, endplate irregularity and sclerosis. IMPRESSION: 1. Slight retraction of peri-appendiceal drainage catheter however tip remains within residual approximately 3.8 cm residual air and fluid collection. 2. Interval resolution of pelvic fluid collection following left-sided trans gluteal percutaneous drainage catheter placement. Note, the left-sided percutaneous drainage catheter has also been slightly retracted though tip remains well positioned within the midline of the lower pelvis. 3. No new definable/drainable abdominal or pelvic fluid collections. 4. Large colonic stool burden without evidence of enteric obstruction. 5. Similar appearance of known right lower lobe mass as previously characterized on dedicated chest CT performed 11/14/2016. 6. Increased bilateral pleural effusions with worsening left basilar atelectasis, though note, underlying infection is not excluded. 7. Cardiomegaly with increased size of bilateral pleural effusions and worsening diffuse body wall edema, constellation of findings worrisome for CHF. 8. Nodularity hepatic contour suggestive of early cirrhotic change. Correlation LFTs is recommended. 9. Aortic Atherosclerosis (ICD10-170.0) Suspected hemodynamically significant stenosis involving the right common iliac artery, incompletely evaluated on this non CTA examination. Correlation for right lower extremity PAD symptoms is recommended. Electronically Signed   By: Sandi Mariscal M.D.   On: 12/21/2016 12:44   Ir Sinus/fist Tube Chk-non Gi  Result Date: 12/21/2016 INDICATION: 71 year old Alisha Fisher with a history of ruptured appendicitis and percutaneous drainage. CT drain placed 12/04/2016 EXAM: FLUOROSCOPIC DRAIN INJECTION MEDICATIONS: The patient is currently admitted to the hospital and receiving intravenous antibiotics. The antibiotics were administered within an appropriate time frame prior to the initiation of the procedure. ANESTHESIA/SEDATION: None COMPLICATIONS: None  PROCEDURE: Informed written consent was obtained from the patient after a thorough discussion of the procedural risks, benefits and alternatives. All questions were addressed. Sterile technique was utilized including caps, mask, sterile gowns, sterile gloves, sterile drape, hand hygiene and skin antiseptic. A timeout was performed prior  to the initiation of the procedure. Patient positioned supine position on the fluoroscopy table. Scout images of the right lower abdomen performed. Contrast was injected through the indwelling drain with images stored. Drain was then flushed and attached to gravity drainage. Patient tolerated procedure well and remained hemodynamically stable throughout. No complications were encountered and no significant blood loss. FINDINGS: Initial image demonstrates drain within the right lower quadrant. Injection of the drain demonstrates immediate filling of the tubular structure which is connected to the cecum. Small amount of contrast decompressed through the skin site along the tract of the drain. No abscess cavity identified. IMPRESSION: Status post drain injection of right lower quadrant in the region of prior ruptured appendicitis, confirming fistulous connection of the drainage catheter with the appendix. It appears as though the ruptured appendix has closed around the drain, with the current drain acting as a cecostomy tube. These results were called by telephone at the time of interpretation on 12/21/2016 at 2:34 pm to Dr. Earnstine Regal , who verbally acknowledged these results. Signed, Dulcy Fanny. Earleen Newport, DO [facility] Radiology Electronically Signed   By: Corrie Mckusick D.O.   On: 12/21/2016 14:35   Ir US Guide Bx Asp/drain  Result Date: 12/21/2016 INDICATION: 71 year old Alisha Fisher with axillary lymphadenopathy. EXAM: IR ULTRASOUND GUIDANCE MEDICATIONS: None. ANESTHESIA/SEDATION: None FLUOROSCOPY TIME:  None COMPLICATIONS: None  PROCEDURE: Informed written consent was obtained from the patient after a thorough discussion of the procedural risks, benefits and alternatives. All questions were addressed. Maximal Sterile Barrier Technique was utilized including caps, mask, sterile gowns, sterile gloves, sterile drape, hand hygiene and skin antiseptic. A timeout was performed prior to the initiation of the procedure. Patient positioned supine position on the fluoroscopic table. Ultrasound images of the right axillary region were performed with images stored and sent to PACs. The patient was then prepped and draped in the usual sterile fashion. The skin and subcutaneous tissues were generously infiltrated 1% lidocaine for local anesthesia. Multiple core biopsy were achieved using 18 gauge device. Images were stored. Tissue samples placed in the saline. Patient tolerated the procedure well and remained hemodynamically stable throughout. No complications were encountered and no significant blood loss. IMPRESSION: Status post ultrasound-guided biopsy of right axillary node. Tissue specimen sent to pathology for complete histopathologic analysis. Signed, Dulcy Fanny. Earleen Newport, DO [facility] Radiology Electronically Signed   By: Corrie Mckusick D.O.   On: 12/21/2016 15:14   Dg Chest Port 1 View  Result Date: 12/19/2016 CLINICAL DATA:  Respiratory distress EXAM: PORTABLE CHEST 1 VIEW COMPARISON:  December 18, 2016 FINDINGS: Central catheter tip is in the superior vena cava. No pneumothorax. There is airspace consolidation in each lower lobe with small pleural effusions bilaterally. There is cardiomegaly with mild pulmonary venous hypertension. No adenopathy. There is atherosclerotic calcification in the aorta. No bone lesions. IMPRESSION: Central catheter tip in superior vena cava. No pneumothorax. Findings indicative of a degree of congestive heart failure. Superimposed bibasilar pneumonia cannot be excluded  radiographically. Pneumonia and alveolar edema may present concurrently. There is aortic atherosclerosis. Electronically Signed   By: Lowella Grip III M.D.   On: 12/19/2016 08:56   Dg Chest Port 1 View  Result Date: 12/18/2016 CLINICAL DATA:  Low oxygen saturation. EXAM: PORTABLE CHEST 1 VIEW COMPARISON:  12/17/2016.  CT 12/08/2016. FINDINGS: Right PICC line noted with tip projected over the right atrium. Cardiomegaly with pulmonary vascular prominence, bilateral interstitial prominence, bilateral pleural effusions. Findings consistent CHF. Interim  slight progression from prior exam. Right infrahilar mass again noted. IMPRESSION: 1. Right PICC line in stable position. 2. Congestive heart failure bilateral pulmonary interstitial edema bilateral pleural effusions. Interstitial edema is progressed from prior exam. 3. Right infrahilar mass again noted. Electronically Signed   By: Marcello Moores  Register   On: 12/18/2016 14:00    Labs:  CBC:  Recent Labs  12/17/16 0450 12/19/16 1006 12/20/16 0500 12/21/16 0400  WBC 13.0* 10.1 11.9* 12.1*  HGB 11.3* 10.1* 9.8* 9.7*  HCT 34.7* 31.6* 30.4* 30.1*  PLT 336 229 213 207    COAGS:  Recent Labs  12/04/16 1005  INR 1.15    BMP:  Recent Labs  12/18/16 0458 12/19/16 0358 12/19/16 1006 12/20/16 0500 12/21/16 0400  NA 139  --  140 135 136  K 4.3  --  3.8 4.5 4.2  CL 98*  --  100* 88* 86*  CO2 36*  --  35* 42* 45*  GLUCOSE 120*  --  109* 112* 87  BUN 11  --  14 21* 27*  CALCIUM 8.4*  --  7.4* 8.3* 8.4*  CREATININE 0.70 0.68 0.63 0.92 0.80  GFRNONAA >60 >60 >60 >60 >60  GFRAA >60 >60 >60 >60 >60    LIVER FUNCTION TESTS:  Recent Labs  12/03/16 1613 12/15/16 0452 12/19/16 1006  BILITOT 0.7 0.8 0.5  AST 19 10* 13*  ALT 13* 9* 11*  ALKPHOS 98 57 63  PROT 6.7 4.8* 5.1*  ALBUMIN 2.8* 1.9* 2.1*    Assessment and Plan: Perforated appendicitis with multiple intra-abdominal abscess s/p RLQ drain placed 12/04/16,  left transgluteal  pelvic drain placed 12/14/16; path from recent bronch revelas squamous cell carcinoma; status post right axillary lymph node biopsy today. AF; WBC 12.1(11.9), hgb 9.7, creat nl; follow-up CT shows resolution of left pelvic fluid collection and no new definable/drainable abdominal/pelvic fluid collections; right lower quadrant drain injection shows fistulous connection with the appendix-it appeared that the ruptured appendix  closed around the drain with the current draining acting as a cecostomy tube. Per CCS request the left transgluteal drain was removed in its entirety. Gauze dressing applied over the site. No immediate complications. Further plans as per CCS.   Electronically Signed: D. Rowe Robert 12/21/2016, 4:37 PM   I spent a total of 20 minutes at the the patient's bedside AND on the patient's hospital floor or unit, greater than 50% of which was counseling/coordinating care for pelvic abscess drain    Patient ID: Alisha Fisher, Alisha Fisher   DOB: [birth year], 71 y.o.   MRN: [id number]

## 2016-12-22 ENCOUNTER — Inpatient Hospital Stay (HOSPITAL_COMMUNITY): Payer: Medicare Other

## 2016-12-22 DIAGNOSIS — R0989 Other specified symptoms and signs involving the circulatory and respiratory systems: Secondary | ICD-10-CM

## 2016-12-22 DIAGNOSIS — M79652 Pain in left thigh: Secondary | ICD-10-CM

## 2016-12-22 DIAGNOSIS — C3491 Malignant neoplasm of unspecified part of right bronchus or lung: Secondary | ICD-10-CM

## 2016-12-22 DIAGNOSIS — I509 Heart failure, unspecified: Secondary | ICD-10-CM

## 2016-12-22 DIAGNOSIS — R21 Rash and other nonspecific skin eruption: Secondary | ICD-10-CM

## 2016-12-22 DIAGNOSIS — M79609 Pain in unspecified limb: Secondary | ICD-10-CM

## 2016-12-22 LAB — CBC
HEMATOCRIT: 31.2 % — AB (ref 36.0–46.0)
HEMOGLOBIN: 9.9 g/dL — AB (ref 12.0–15.0)
MCH: 30.7 pg (ref 26.0–34.0)
MCHC: 31.7 g/dL (ref 30.0–36.0)
MCV: 96.6 fL (ref 78.0–100.0)
Platelets: 197 10*3/uL (ref 150–400)
RBC: 3.23 MIL/uL — ABNORMAL LOW (ref 3.87–5.11)
RDW: 14.7 % (ref 11.5–15.5)
WBC: 11.2 10*3/uL — ABNORMAL HIGH (ref 4.0–10.5)

## 2016-12-22 LAB — ECHOCARDIOGRAM COMPLETE
HEIGHTINCHES: 67 in
Weight: 4352.76 oz

## 2016-12-22 LAB — COMPREHENSIVE METABOLIC PANEL
ALBUMIN: 2.4 g/dL — AB (ref 3.5–5.0)
ALT: 23 U/L (ref 14–54)
ANION GAP: 5 (ref 5–15)
AST: 24 U/L (ref 15–41)
Alkaline Phosphatase: 59 U/L (ref 38–126)
BILIRUBIN TOTAL: 0.6 mg/dL (ref 0.3–1.2)
BUN: 34 mg/dL — AB (ref 6–20)
CHLORIDE: 84 mmol/L — AB (ref 101–111)
CO2: 47 mmol/L — AB (ref 22–32)
Calcium: 8.4 mg/dL — ABNORMAL LOW (ref 8.9–10.3)
Creatinine, Ser: 0.82 mg/dL (ref 0.44–1.00)
GFR calc Af Amer: >60 mL/min (ref >60)
GFR calc non Af Amer: >60 mL/min (ref >60)
GLUCOSE: 78 mg/dL (ref 65–99)
POTASSIUM: 4.1 mmol/L (ref 3.5–5.1)
Sodium: 136 mmol/L (ref 135–145)
TOTAL PROTEIN: 5.5 g/dL — AB (ref 6.5–8.1)

## 2016-12-22 MED ORDER — DIPHENHYDRAMINE HCL 50 MG/ML IJ SOLN
25.0000 mg | Freq: Four times a day (QID) | INTRAMUSCULAR | Status: DC | PRN
  Administered 2016-12-22 (×3): 25 mg via INTRAVENOUS
  Filled 2016-12-22 (×2): qty 1

## 2016-12-22 MED ORDER — TRIAMCINOLONE 0.1 % CREAM:EUCERIN CREAM 1:1
1.0000 "application " | TOPICAL_CREAM | Freq: Two times a day (BID) | CUTANEOUS | Status: DC | PRN
  Filled 2016-12-22: qty 1

## 2016-12-22 MED ORDER — FAMOTIDINE IN NACL 20-0.9 MG/50ML-% IV SOLN
20.0000 mg | Freq: Two times a day (BID) | INTRAVENOUS | Status: DC
Start: 2016-12-22 — End: 2016-12-23
  Administered 2016-12-22 (×2): 20 mg via INTRAVENOUS
  Filled 2016-12-22 (×2): qty 50

## 2016-12-22 MED ORDER — FENTANYL CITRATE (PF) 100 MCG/2ML IJ SOLN
12.5000 ug | Freq: Once | INTRAMUSCULAR | Status: AC
  Administered 2016-12-22: 12.5 ug via INTRAVENOUS
  Filled 2016-12-22: qty 2

## 2016-12-22 MED ORDER — DIPHENHYDRAMINE HCL 50 MG/ML IJ SOLN
50.0000 mg | Freq: Four times a day (QID) | INTRAMUSCULAR | Status: DC | PRN
  Administered 2016-12-23 (×2): 50 mg via INTRAVENOUS
  Filled 2016-12-22 (×4): qty 1

## 2016-12-22 MED ORDER — CHLORHEXIDINE GLUCONATE CLOTH 2 % EX PADS
6.0000 | MEDICATED_PAD | Freq: Every day | CUTANEOUS | Status: DC
  Administered 2016-12-22: 6 via TOPICAL

## 2016-12-22 MED ORDER — GABAPENTIN 100 MG PO CAPS
100.0000 mg | ORAL_CAPSULE | Freq: Three times a day (TID) | ORAL | Status: DC
  Administered 2016-12-22 – 2016-12-26 (×13): 100 mg via ORAL
  Filled 2016-12-22 (×13): qty 1

## 2016-12-22 NOTE — Progress Notes (Signed)
CONSULT NOTE    Alisha Fisher   HYQ:[id number]  DOB: Mar 14, 1946  DOA: 12/03/2016 PCP: Pcp Not In System   Brief Narrative:  This is a 71 year old female with history of hypertension, COPD on chronic home oxygen 2 L nasal cannula, obesity, tobacco abuse, who was admitted on the surgical service on 12/03/2016 with abdominal pain, found to have a ruptured appendix and peritonitis and taken to the operating room.  Initial investigation showed a right lung mass with subsequent endobronchial obstruction with in the right lower lobe, as well as lymphadenopathy.  Pulmonology was consulted, and patient underwent a bronchoscopy on 3/19 with endobronchial FNA, with pathology showing new diagnosis of squamous cell carcinoma.  Oncology was consulted, as well as radiation oncology and there are plans to irradiate this mass since lung collapse puts her at high risk for developing recurrent postobstructive pneumonia.  During her initial surgery, she underwent drain placement into right side of her abdomen and she had culture from the intra-abdominal abscess, which showed enterococcus faecalis which was sensitive to ampicillin, and she completed a course of antibiotics as below.  She had hypoxic and hypercarbic respiratory failure requiring BiPAP use and her hospital course was complicated by significant fluid overload for which she is on IV diuretics.   Interventional radiology was also consulted to evaluate patient for biopsy of her lymph nodes to evaluate for metastatic disease. She underwent an right axillary LN biopsty on 3/23.    Subjective: Note to be scratching her right arm which is reddish, swollen. No other complains other than itching at that time. Later called by RN for 10/10 pain in left thing. I spoke with the patient about this. She has been having it on and off since being in the hospital and she cannot relate it to anything. She states it feels like numbness, tingling and an ache. She started having  it just now when she was laying on her left side.   Assessment & Plan:   Principal Problem:   Acute appendicitis with perforation and peritoneal abscess Active Problems:   Obesity   Smoker   COPD (chronic obstructive pulmonary disease) (HCC)   Intraperitoneal abscess s/p perc drainage 12/14/2016   Hypokalemia   Hypomagnesemia   Endobronchial mass   Acute on chronic respiratory failure with hypoxia and hypercapnia (HCC)   Essential hypertension   Anemia  Acute on chronic hypercarbic and hypoxic respiratory failure -Multifactorial due to COPD, acute on chronic diastolic CHF/ fluid resusitation, - Pulse ox upper 90s on 4 L nasal cannula  - CXR 3/21 suggestive of pulm edema vs pneumonia - add IS  (A)COPD /suspect obstructive sleep apnea -Pulmonology was consulted and have evaluated patient.   -Continue nightly BiPAP -Currently she is on Brovana, Pulmicort, DuoNeb's as well as a prednisone taper (Solumedrol 3/21 and 3/22  (B) Fluid overload- possible acute on chronic dCHF - weight 102 kg on 3/5 and 126 kg on 3/21 - Lasix 40 mg IV Q12, - also on Losarta/ HCTZ - f/u 2  D ECHO  Rash - generalized on arms, breasts, abdomen- macular in some areas and confluent in others- severely pruritic - she did not have it yesterday -  received a small amount of dye yesterday for drain- other new meds were Tramadol and Phenergan yesterday which I have stopped - already on prednisone 60 mg - add Benadryl and Pepcid IV and reassess later today - of note, had a rash earlier on in the admission attributed to medications  Left ant  thigh pain - ? Lateral cutaneous nerve compression- start Neurontin - Dr Marin Olp checking Venous duplex  Acute appendicitis with perforation/peritoneal abscess -Initial cultures grew Enterococcus faecalis, patient has completed more than 2 weeks of antibiotics  -Management per primary service   Newly diagnosed non-small cell lung cancer, squamous cell carcinoma of the  right upper lobe -Pulmonology consulted-  status post bronchoscopy on 3/19 with positive FNA -Radiation oncology and oncology are following -We will complete the staging with an MRI of the brain -She will undergo radiation therapy to the mass, she will go for simulation on 12/22/78  Hypertension -Currently on Norvasc, Lasix, Cozaar, HCTZ and metoprolol.  Continue.  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  -avoid narcotics given respiratory status - OOB  Morbid obesity Body mass index is 42.61 kg/m.    DVT prophylaxis: Lovenox  Procedures:  3/19 bronchoscopy w/ endobronchial FNA of bronchus intermedius mass, brushing of RMSB &EBUS w/ FNA  Antimicrobials:  Anti-infectives    Start     Dose/Rate Route Frequency Ordered Stop   12/17/16 1200  vancomycin (VANCOCIN) IVPB 1000 mg/200 mL premix  Status:  Discontinued     1,000 mg 200 mL/hr over 60 Minutes Intravenous Every 12 hours 12/17/16 0634 12/20/16 1044   12/16/16 1000  fluconazole (DIFLUCAN) tablet 200 mg  Status:  Discontinued     200 mg Oral Daily 12/16/16 0737 12/20/16 1046   12/14/16 0600  vancomycin (VANCOCIN) 1,250 mg in sodium chloride 0.9 % 250 mL IVPB  Status:  Discontinued     1,250 mg 166.7 mL/hr over 90 Minutes Intravenous Every 12 hours 12/13/16 1916 12/17/16 0547   12/10/16 1400  vancomycin (VANCOCIN) IVPB 1000 mg/200 mL premix  Status:  Discontinued     1,000 mg 100 mL/hr over 120 Minutes Intravenous Every 12 hours 12/10/16 1059 12/13/16 1916    12/08/16 1800  ertapenem (INVANZ) 1 g in sodium chloride 0.9 % 50 mL IVPB  Status:  Discontinued     1 g 100 mL/hr over 30 Minutes Intravenous Every 24 hours 12/08/16 1512 12/20/16 1044   12/08/16 1600  vancomycin (VANCOCIN) IVPB 750 mg/150 ml premix  Status:  Discontinued     750 mg 100 mL/hr over 90 Minutes Intravenous Every 12 hours 12/08/16 1513 12/10/16 1059   12/07/16 2300  vancomycin (VANCOCIN) IVPB 750 mg/150 ml premix  Status:  Discontinued     750 mg 150 mL/hr over 60 Minutes Intravenous Every 12 hours 12/07/16 1103 12/08/16 1513   12/07/16 2200  ciprofloxacin (CIPRO) IVPB 400 mg  Status:  Discontinued     400 mg 200 mL/hr over 60 Minutes Intravenous Every 12 hours 12/07/16 1122 12/08/16 1512   12/07/16 1115  vancomycin (VANCOCIN) 2,000 mg in sodium chloride 0.9 % 500 mL IVPB     2,000 mg 250 mL/hr over 120 Minutes Intravenous  Once 12/07/16 1103 12/07/16 1440   12/06/16 2200  ciprofloxacin (CIPRO) IVPB 400 mg  Status:  Discontinued     400 mg 200 mL/hr over 60 Minutes Intravenous Every 12 hours 12/06/16 1409 12/07/16 1039  12/06/16 1400  piperacillin-tazobactam (ZOSYN) IVPB 3.375 g  Status:  Discontinued     3.375 g 12.5 mL/hr over 240 Minutes Intravenous Every 8 hours 12/06/16 0643 12/06/16 0825   12/06/16 1000  ciprofloxacin (CIPRO) IVPB 200 mg  Status:  Discontinued     200 mg 100 mL/hr over 60 Minutes Intravenous Every 12 hours 12/06/16 0825 12/06/16 1409   12/06/16 1000  metroNIDAZOLE (FLAGYL) IVPB 500 mg  Status:  Discontinued     500 mg 100 mL/hr over 60 Minutes Intravenous Every 8 hours 12/06/16 0825 12/08/16 1512   12/04/16 0630  piperacillin-tazobactam (ZOSYN) 3.375 g in dextrose 5 % 50 mL IVPB  Status:  Discontinued     3.375 g 12.5 mL/hr over 240 Minutes Intravenous Every 8 hours 12/04/16 0623 12/06/16 0643   12/03/16 2130  piperacillin-tazobactam (ZOSYN) IVPB 3.375 g  Status:  Discontinued     3.375 g 12.5 mL/hr over 240 Minutes Intravenous Every 8 hours  12/03/16 2125 12/04/16 0623   12/03/16 1915  piperacillin-tazobactam (ZOSYN) IVPB 3.375 g     3.375 g 100 mL/hr over 30 Minutes Intravenous  Once 12/03/16 1907 12/03/16 2005       Objective: Vitals:   12/22/16 0900 12/22/16 1000 12/22/16 1027 12/22/16 1030  BP:  (!) 154/58 (!) 154/58 (!) 154/58  Pulse:   65   Resp: 16 18    Temp: 97.9 F (36.6 C) 98.1 F (36.7 C)    TempSrc:      SpO2: 96% 93%    Weight:      Height:        Intake/Output Summary (Last 24 hours) at 12/22/16 1044 Last data filed at 12/22/16 0836  Gross per 24 hour  Intake               60 ml  Output             1825 ml  Net            -1765 ml   Filed Weights   12/19/16 0500 12/20/16 0500 12/21/16 0413  Weight: 126.1 kg (278 lb) 123.2 kg (271 lb 9.7 oz) 123.4 kg (272 lb 0.8 oz)    Examination: General exam: Appears comfortable  HEENT: PERRLA, oral mucosa moist, no sclera icterus or thrush Respiratory system: Clear to auscultation. Respiratory effort normal. Cardiovascular system: S1 & S2 heard, RRR.  No murmurs  Gastrointestinal system: Abdomen soft, non-tender, nondistended. Normal bowel sound. Drain in RLQ with yellowish discharge Central nervous system: Alert and oriented. No focal neurological deficits. Extremities: No cyanosis, clubbing or edema Skin: RASH: generalized on arms, breasts, abdomen- macular in some areas and confluent in others Psychiatry:  Mood & affect appropriate.     Data Reviewed: I have personally reviewed following labs and imaging studies  CBC:  Recent Labs Lab 12/17/16 0450 12/19/16 1006 12/20/16 0500 12/21/16 0400 12/22/16 0500  WBC 13.0* 10.1 11.9* 12.1* 11.2*  NEUTROABS 9.6*  --   --   --   --   HGB 11.3* 10.1* 9.8* 9.7* 9.9*  HCT 34.7* 31.6* 30.4* 30.1* 31.2*  MCV 94.6 96.9 95.9 95.9 96.6  PLT 336 229 213 207 073   Basic Metabolic Panel:  Recent Labs Lab 12/16/16 0457 12/17/16 0450 12/18/16 0458 12/19/16 0358 12/19/16 1006 12/20/16 0500  12/21/16 0400 12/22/16 0500  NA 139 140 139  --  140 135 136 136  K 3.2* 4.3 4.3  --  3.8 4.5 4.2 4.1  CL 101 101 98*  --  100* 88* 86* 84*  CO2 33* 34* 36*  --  35* 42* 45* 47*  GLUCOSE 100* 95 120*  --  109* 112* 87 78  BUN '9 9 11  '$ --  14 21* 27* 34*  CREATININE 0.56 0.61 0.70 0.68 0.63 0.92 0.80 0.82  CALCIUM 8.0* 8.1* 8.4*  --  7.4* 8.3* 8.4* 8.4*  MG 1.7 1.9  --   --   --   --   --   --   PHOS 3.5  --   --   --   --   --   --   --    GFR: Estimated Creatinine Clearance: 88.2 mL/min (by C-G formula based on SCr of 0.82 mg/dL). Liver Function Tests:  Recent Labs Lab 12/19/16 1006 12/22/16 0500  AST 13* 24  ALT 11* 23  ALKPHOS 63 59  BILITOT 0.5 0.6  PROT 5.1* 5.5*  ALBUMIN 2.1* 2.4*   No results for input(s): LIPASE, AMYLASE in the last 168 hours. No results for input(s): AMMONIA in the last 168 hours. Coagulation Profile: No results for input(s): INR, PROTIME in the last 168 hours. Cardiac Enzymes: No results for input(s): CKTOTAL, CKMB, CKMBINDEX, TROPONINI in the last 168 hours. BNP (last 3 results) No results for input(s): PROBNP in the last 8760 hours. HbA1C: No results for input(s): HGBA1C in the last 72 hours. CBG:  Recent Labs Lab 12/17/16 0447 12/21/16 0751 12/21/16 0800 12/21/16 1531 12/21/16 1921  GLUCAP 77 111* 82 103* 152*   Lipid Profile: No results for input(s): CHOL, HDL, LDLCALC, TRIG, CHOLHDL, LDLDIRECT in the last 72 hours. Thyroid Function Tests: No results for input(s): TSH, T4TOTAL, FREET4, T3FREE, THYROIDAB in the last 72 hours. Anemia Panel: No results for input(s): VITAMINB12, FOLATE, FERRITIN, TIBC, IRON, RETICCTPCT in the last 72 hours. Urine analysis:    Component Value Date/Time   COLORURINE YELLOW 12/04/2016 0425   APPEARANCEUR HAZY (A) 12/04/2016 0425   LABSPEC >1.046 (H) 12/04/2016 0425   PHURINE 5.0 12/04/2016 0425   GLUCOSEU NEGATIVE 12/04/2016 0425   HGBUR NEGATIVE 12/04/2016 0425   BILIRUBINUR NEGATIVE 12/04/2016  0425   KETONESUR NEGATIVE 12/04/2016 0425   PROTEINUR NEGATIVE 12/04/2016 0425   NITRITE NEGATIVE 12/04/2016 0425   LEUKOCYTESUR NEGATIVE 12/04/2016 0425   Sepsis Labs: '@LABRCNTIP'$ (procalcitonin:4,lacticidven:4) ) Recent Results (from the past 240 hour(s))  MRSA PCR Screening     Status: None   Collection Time: 12/19/16 10:06 AM  Result Value Ref Range Status   MRSA by PCR NEGATIVE NEGATIVE Final    Comment:        The GeneXpert MRSA Assay (FDA approved for NASAL specimens only), is one component of a comprehensive MRSA colonization surveillance program. It is not intended to diagnose MRSA infection nor to guide or monitor treatment for MRSA infections.          Radiology Studies: Ct Abdomen Pelvis W Contrast  Result Date: 12/21/2016 CLINICAL DATA:  History of ruptured appendicitis, post percutaneous periappendiceal drainage catheter placement on 12/04/2016 and left trans gluteal approach percutaneous drainage catheter placement on 12/14/2016. EXAM: CT ABDOMEN AND PELVIS WITH CONTRAST TECHNIQUE: Multidetector CT imaging of the abdomen and pelvis was performed using the standard protocol following bolus administration of intravenous contrast. CONTRAST:  100 ISOVUE-300 IOPAMIDOL (ISOVUE-300) INJECTION 61% COMPARISON:  CT-guided periappendiceal abscess drainage catheter placement- 12/04/2016; left fibula approach percutaneous drainage catheter placement - 12/14/2016; CT abdomen pelvis -12/13/2016; 12/03/2016 ; chest CT- 11/14/2016 FINDINGS: Lower chest: Limited visualization of the lower thorax demonstrates chronic  atelectasis / collapse of the right lower lobe. Interval increase in size of small left-sided effusion with complete atelectasis of the imaged portion of the left lower lobe. Interval increase in small right-sided pleural effusion. Cardiomegaly. Coronary artery calcifications. No pericardial effusion. Hepatobiliary: Mild nodularity hepatic contour. No discrete hepatic  lesions. Normal appearance of the gallbladder given degree distention. No radiopaque gallstones. No intra extrahepatic bili duct dilatation. No ascites. Pancreas: Normal appearance of the pancreas Spleen: Normal appearance of the spleen. Note is made of a small splenule. Adrenals/Urinary Tract: There is symmetric enhancement and excretion of the bilateral kidneys. The left kidney appears mildly hypertrophied in comparison to the right. No definite renal stones this postcontrast examination. No discrete renal lesions. There is a minimal amount of likely age and body habitus related perinephric stranding. No urinary obstruction. Normal appearance the bilateral adrenal glands. Normal appearance of the urinary bladder given underdistention with a focal catheter. Stomach/Bowel: Partial retraction of Terry appendiceal abscess drainage catheter with radiopaque site marker now projecting external to the right abdominal musculature (image 67, series 2). The dominant component of the pigtail does remain within a residual 3.8 x 1.9 cm periappendiceal fluid collection (coronal image 54, series 5). Interval retraction of left trans gluteal approach percutaneous drainage catheter with radiopaque site marker now within in the central aspect of the left pelvic sidewall musculature (image 75, series 2). The tip of the pigtail drain does remain appropriately positioned within the post a vesicular space. There has been complete resolution of previously noted pelvic collection. A small amount of fluid is seen within the pelvic cul-de-sac without peripheral wall enhancement. Large colonic stool burden without evidence of enteric obstruction. The colon is again noted to be interposed about the liver edge normal appearance of the terminal ileum. No pneumoperitoneum, pneumatosis or portal venous gas. No new definable/ drainable fluid collections within the abdomen or pelvis. Vascular/Lymphatic: Large amount of irregular mixed calcified and  noncalcified atherosclerotic plaque throughout the abdominal aorta. Suspected severe (at least 75%) luminal narrowing involving the right common iliac artery (axial image 58, series 2, coronal image 77, series 5), incompletely evaluated on this non CTA examination. The major branch vessels of the abdominal aorta appear patent on this non CTA examination. Scattered retroperitoneal, pelvic and inguinal lymph nodes are numerous though individually not enlarged by size criteria in presumed reactive due to patient body habitus. Reproductive: Normal appearance of the pelvic organs. No discrete adnexal lesion. Other: Large amount of body wall edema. Musculoskeletal: No acute or aggressive osseous abnormalities. Moderate DDD of L3-L4 with disc space height loss, endplate irregularity and sclerosis. IMPRESSION: 1. Slight retraction of peri-appendiceal drainage catheter however tip remains within residual approximately 3.8 cm residual air and fluid collection. 2. Interval resolution of pelvic fluid collection following left-sided trans gluteal percutaneous drainage catheter placement. Note, the left-sided percutaneous drainage catheter has also been slightly retracted though tip remains well positioned within the midline of the lower pelvis. 3. No new definable/drainable abdominal or pelvic fluid collections. 4. Large colonic stool burden without evidence of enteric obstruction. 5. Similar appearance of known right lower lobe mass as previously characterized on dedicated chest CT performed 11/14/2016. 6. Increased bilateral pleural effusions with worsening left basilar atelectasis, though note, underlying infection is not excluded. 7. Cardiomegaly with increased size of bilateral pleural effusions and worsening diffuse body wall edema, constellation of findings worrisome for CHF. 8. Nodularity hepatic contour suggestive of early cirrhotic change. Correlation LFTs is recommended. 9. Aortic Atherosclerosis (ICD10-170.0)  Suspected hemodynamically  significant stenosis involving the right common iliac artery, incompletely evaluated on this non CTA examination. Correlation for right lower extremity PAD symptoms is recommended. Electronically Signed   By: Sandi Mariscal M.D.   On: 12/21/2016 12:44   Ir Sinus/fist Tube Chk-non Gi  Result Date: 12/21/2016 INDICATION: 71 year old female with a history of ruptured appendicitis and percutaneous drainage. CT drain placed 12/04/2016 EXAM: FLUOROSCOPIC DRAIN INJECTION MEDICATIONS: The patient is currently admitted to the hospital and receiving intravenous antibiotics. The antibiotics were administered within an appropriate time frame prior to the initiation of the procedure. ANESTHESIA/SEDATION: None COMPLICATIONS: None PROCEDURE: Informed written consent was obtained from the patient after a thorough discussion of the procedural risks, benefits and alternatives. All questions were addressed. Sterile technique was utilized including caps, mask, sterile gowns, sterile gloves, sterile drape, hand hygiene and skin antiseptic. A timeout was performed prior to the initiation of the procedure. Patient positioned supine position on the fluoroscopy table. Scout images of the right lower abdomen performed. Contrast was injected through the indwelling drain with images stored. Drain was then flushed and attached to gravity drainage. Patient tolerated procedure well and remained hemodynamically stable throughout. No complications were encountered and no significant blood loss. FINDINGS: Initial image demonstrates drain within the right lower quadrant. Injection of the drain demonstrates immediate filling of the tubular structure which is connected to the cecum. Small amount of contrast decompressed through the skin site along the tract of the drain. No abscess cavity identified. IMPRESSION: Status post drain injection of right lower quadrant in the region of prior ruptured appendicitis, confirming  fistulous connection of the drainage catheter with the appendix. It appears as though the ruptured appendix has closed around the drain, with the current drain acting as a cecostomy tube. These results were called by telephone at the time of interpretation on 12/21/2016 at 2:34 pm to Dr. Earnstine Regal , who verbally acknowledged these results. Signed, Dulcy Fanny. Earleen Newport, DO [facility] Electronically Signed   By: Corrie Mckusick D.O.   On: 12/21/2016 14:35   Ir US Guide Bx Asp/drain  Result Date: 12/21/2016 INDICATION: 71 year old female with axillary lymphadenopathy. EXAM: IR ULTRASOUND GUIDANCE MEDICATIONS: None. ANESTHESIA/SEDATION: None FLUOROSCOPY TIME:  None COMPLICATIONS: None PROCEDURE: Informed written consent was obtained from the patient after a thorough discussion of the procedural risks, benefits and alternatives. All questions were addressed. Maximal Sterile Barrier Technique was utilized including caps, mask, sterile gowns, sterile gloves, sterile drape, hand hygiene and skin antiseptic. A timeout was performed prior to the initiation of the procedure. Patient positioned supine position on the fluoroscopic table. Ultrasound images of the right axillary region were performed with images stored and sent to PACs. The patient was then prepped and draped in the usual sterile fashion. The skin and subcutaneous tissues were generously infiltrated 1% lidocaine for local anesthesia. Multiple core biopsy were achieved using 18 gauge device. Images were stored. Tissue samples placed in the saline. Patient tolerated the procedure well and remained hemodynamically stable throughout. No complications were encountered and no significant blood loss. IMPRESSION: Status post ultrasound-guided biopsy of right axillary node. Tissue specimen sent to pathology for complete histopathologic analysis. Signed, Dulcy Fanny. Earleen Newport, DO [facility] Electronically Signed   By: Corrie Mckusick D.O.   On: 12/21/2016 15:14      Scheduled Meds: . sodium chloride   Intravenous Once  . amLODipine  10 mg Oral Daily  . arformoterol  15 mcg Nebulization  Q12H  . budesonide (PULMICORT) nebulizer solution  0.5 mg Nebulization Q12H  . chlorhexidine  15 mL Mouth Rinse BID  . cholecalciferol  1,000 Units Oral Daily  . citalopram  10 mg Oral Daily  . enoxaparin (LOVENOX) injection  40 mg Subcutaneous Q24H  . famotidine (PEPCID) IV  20 mg Intravenous Q12H  . furosemide  40 mg Intravenous Q12H  . gabapentin  100 mg Oral TID  . losartan  100 mg Oral Daily   And  . hydrochlorothiazide  25 mg Oral Daily  . ipratropium-albuterol  3 mL Nebulization TID  . lip balm  1 application Topical BID  . mouth rinse  15 mL Mouth Rinse q12n4p  . metoprolol  150 mg Oral Daily  . pantoprazole  40 mg Oral Q1200  . polyethylene glycol  17 g Oral Daily  . predniSONE  60 mg Oral Q breakfast  . protein supplement shake  11 oz Oral Q24H  . saccharomyces boulardii  250 mg Oral BID  . sodium chloride flush  3 mL Intravenous Q12H   Continuous Infusions:   LOS: 19 days    Time spent in minutes: 40    [place], MD [facility] Pager: [email or web address] Password [facility] 12/22/2016, 10:44 AM

## 2016-12-22 NOTE — Progress Notes (Signed)
General Surgery [facility]  Assessment & Plan:  Perforated appendicitis  s/p CT guided pelvic drain placement 12/04/16   s/p CT scan and IR drain placed 12/14/16, left trans gluteal approach - removed yesterday  Tolerating soft diet  Squamous cell carcinoma right lung  Radiation therapy / medical oncology involved  Respiratory failure  Secondary to sedation with procedure - now stabilized  Appreciate TRH involvement and assistance.  OK to transfer to floor from surgical perspective - will check with TRH.         Earnstine Regal, MD, [facility]       Office: [phone]    Subjective: Patient in bed, no complaints except "rash is coming back". Denies abdominal pain.  Tolerating soft diet.  Objective: Vital signs in last 24 hours: Temp:  [97.5 F (36.4 C)-98.2 F (36.8 C)] 97.7 F (36.5 C) (03/24 0600) Resp:  [10-20] 15 (03/24 0600) BP: (141-171)/(50-65) 144/53 (03/24 0600) SpO2:  [86 %-99 %] 99 % (03/24 0751) FiO2 (%):  [40 %] 40 % (03/24 0055) Last BM Date:  (pt unsure)  Intake/Output from previous day: 03/23 0701 - 03/24 0700 In: 10  Out: 1825 [Urine:1825] Intake/Output this shift: No intake/output data recorded.  Physical Exam: HEENT - sclerae clear, mucous membranes moist Abdomen - soft, obese; mild tenderness RLQ; drain in RLQ with small thin feculent output in bag  Lab Results:   Recent Labs  12/21/16 0400 12/22/16 0500  WBC 12.1* 11.2*  HGB 9.7* 9.9*  HCT 30.1* 31.2*  PLT 207 197   BMET  Recent Labs  12/21/16 0400 12/22/16 0500  NA 136 136  K 4.2 4.1  CL 86* 84*  CO2 45* 47*  GLUCOSE 87 78  BUN 27* 34*  CREATININE 0.80 0.82  CALCIUM 8.4* 8.4*   PT/INR No results for input(s): LABPROT, INR in the last 72 hours. Comprehensive Metabolic Panel:    Component Value Date/Time   NA 136 12/22/2016 0500   NA 136 12/21/2016 0400   K 4.1 12/22/2016 0500   K 4.2 12/21/2016 0400   CL 84  (L) 12/22/2016 0500   CL 86 (L) 12/21/2016 0400   CO2 47 (H) 12/22/2016 0500   CO2 45 (H) 12/21/2016 0400   BUN 34 (H) 12/22/2016 0500   BUN 27 (H) 12/21/2016 0400   CREATININE 0.82 12/22/2016 0500   CREATININE 0.80 12/21/2016 0400   GLUCOSE 78 12/22/2016 0500   GLUCOSE 87 12/21/2016 0400   CALCIUM 8.4 (L) 12/22/2016 0500   CALCIUM 8.4 (L) 12/21/2016 0400   AST 24 12/22/2016 0500   AST 13 (L) 12/19/2016 1006   ALT 23 12/22/2016 0500   ALT 11 (L) 12/19/2016 1006   ALKPHOS 59 12/22/2016 0500   ALKPHOS 63 12/19/2016 1006   BILITOT 0.6 12/22/2016 0500   BILITOT 0.5 12/19/2016 1006   PROT 5.5 (L) 12/22/2016 0500   PROT 5.1 (L) 12/19/2016 1006   ALBUMIN 2.4 (L) 12/22/2016 0500   ALBUMIN 2.1 (L) 12/19/2016 1006    Studies/Results: Ct Abdomen Pelvis W Contrast  Result Date: 12/21/2016 CLINICAL DATA:  History of ruptured appendicitis, post percutaneous periappendiceal drainage catheter placement on 12/04/2016 and left trans gluteal approach percutaneous drainage catheter placement on 12/14/2016. EXAM: CT ABDOMEN AND PELVIS WITH CONTRAST TECHNIQUE: Multidetector CT imaging of the abdomen and pelvis was performed using the standard protocol following bolus administration of intravenous contrast. CONTRAST:  100 ISOVUE-300 IOPAMIDOL (ISOVUE-300) INJECTION 61%  COMPARISON:  CT-guided periappendiceal abscess drainage catheter placement- 12/04/2016; left fibula approach percutaneous drainage catheter placement - 12/14/2016; CT abdomen pelvis -12/13/2016; 12/03/2016 ; chest CT- 11/14/2016 FINDINGS: Lower chest: Limited visualization of the lower thorax demonstrates chronic atelectasis / collapse of the right lower lobe. Interval increase in size of small left-sided effusion with complete atelectasis of the imaged portion of the left lower lobe. Interval increase in small right-sided pleural effusion. Cardiomegaly. Coronary artery calcifications. No pericardial effusion. Hepatobiliary: Mild nodularity  hepatic contour. No discrete hepatic lesions. Normal appearance of the gallbladder given degree distention. No radiopaque gallstones. No intra extrahepatic bili duct dilatation. No ascites. Pancreas: Normal appearance of the pancreas Spleen: Normal appearance of the spleen. Note is made of a small splenule. Adrenals/Urinary Tract: There is symmetric enhancement and excretion of the bilateral kidneys. The left kidney appears mildly hypertrophied in comparison to the right. No definite renal stones this postcontrast examination. No discrete renal lesions. There is a minimal amount of likely age and body habitus related perinephric stranding. No urinary obstruction. Normal appearance the bilateral adrenal glands. Normal appearance of the urinary bladder given underdistention with a focal catheter. Stomach/Bowel: Partial retraction of Terry appendiceal abscess drainage catheter with radiopaque site marker now projecting external to the right abdominal musculature (image 67, series 2). The dominant component of the pigtail does remain within a residual 3.8 x 1.9 cm periappendiceal fluid collection (coronal image 54, series 5). Interval retraction of left trans gluteal approach percutaneous drainage catheter with radiopaque site marker now within in the central aspect of the left pelvic sidewall musculature (image 75, series 2). The tip of the pigtail drain does remain appropriately positioned within the post a vesicular space. There has been complete resolution of previously noted pelvic collection. A small amount of fluid is seen within the pelvic cul-de-sac without peripheral wall enhancement. Large colonic stool burden without evidence of enteric obstruction. The colon is again noted to be interposed about the liver edge normal appearance of the terminal ileum. No pneumoperitoneum, pneumatosis or portal venous gas. No new definable/ drainable fluid collections within the abdomen or pelvis. Vascular/Lymphatic: Large  amount of irregular mixed calcified and noncalcified atherosclerotic plaque throughout the abdominal aorta. Suspected severe (at least 75%) luminal narrowing involving the right common iliac artery (axial image 58, series 2, coronal image 77, series 5), incompletely evaluated on this non CTA examination. The major branch vessels of the abdominal aorta appear patent on this non CTA examination. Scattered retroperitoneal, pelvic and inguinal lymph nodes are numerous though individually not enlarged by size criteria in presumed reactive due to patient body habitus. Reproductive: Normal appearance of the pelvic organs. No discrete adnexal lesion. Other: Large amount of body wall edema. Musculoskeletal: No acute or aggressive osseous abnormalities. Moderate DDD of L3-L4 with disc space height loss, endplate irregularity and sclerosis. IMPRESSION: 1. Slight retraction of peri-appendiceal drainage catheter however tip remains within residual approximately 3.8 cm residual air and fluid collection. 2. Interval resolution of pelvic fluid collection following left-sided trans gluteal percutaneous drainage catheter placement. Note, the left-sided percutaneous drainage catheter has also been slightly retracted though tip remains well positioned within the midline of the lower pelvis. 3. No new definable/drainable abdominal or pelvic fluid collections. 4. Large colonic stool burden without evidence of enteric obstruction. 5. Similar appearance of known right lower lobe mass as previously characterized on dedicated chest CT performed 11/14/2016. 6. Increased bilateral pleural effusions with worsening left basilar atelectasis, though note, underlying infection is not excluded. 7. Cardiomegaly with  increased size of bilateral pleural effusions and worsening diffuse body wall edema, constellation of findings worrisome for CHF. 8. Nodularity hepatic contour suggestive of early cirrhotic change. Correlation LFTs is recommended. 9.  Aortic Atherosclerosis (ICD10-170.0) Suspected hemodynamically significant stenosis involving the right common iliac artery, incompletely evaluated on this non CTA examination. Correlation for right lower extremity PAD symptoms is recommended. Electronically Signed   By: Sandi Mariscal M.D.   On: 12/21/2016 12:44   Ir Sinus/fist Tube Chk-non Gi  Result Date: 12/21/2016 INDICATION: 71 year old female with a history of ruptured appendicitis and percutaneous drainage. CT drain placed 12/04/2016 EXAM: FLUOROSCOPIC DRAIN INJECTION MEDICATIONS: The patient is currently admitted to the hospital and receiving intravenous antibiotics. The antibiotics were administered within an appropriate time frame prior to the initiation of the procedure. ANESTHESIA/SEDATION: None COMPLICATIONS: None PROCEDURE: Informed written consent was obtained from the patient after a thorough discussion of the procedural risks, benefits and alternatives. All questions were addressed. Sterile technique was utilized including caps, mask, sterile gowns, sterile gloves, sterile drape, hand hygiene and skin antiseptic. A timeout was performed prior to the initiation of the procedure. Patient positioned supine position on the fluoroscopy table. Scout images of the right lower abdomen performed. Contrast was injected through the indwelling drain with images stored. Drain was then flushed and attached to gravity drainage. Patient tolerated procedure well and remained hemodynamically stable throughout. No complications were encountered and no significant blood loss. FINDINGS: Initial image demonstrates drain within the right lower quadrant. Injection of the drain demonstrates immediate filling of the tubular structure which is connected to the cecum. Small amount of contrast decompressed through the skin site along the tract of the drain. No abscess cavity identified. IMPRESSION: Status post drain injection of right lower quadrant in the region of prior  ruptured appendicitis, confirming fistulous connection of the drainage catheter with the appendix. It appears as though the ruptured appendix has closed around the drain, with the current drain acting as a cecostomy tube. These results were called by telephone at the time of interpretation on 12/21/2016 at 2:34 pm to Dr. Earnstine Regal , who verbally acknowledged these results. Signed, Dulcy Fanny. Earleen Newport, DO [facility] Electronically Signed   By: Corrie Mckusick D.O.   On: 12/21/2016 14:35   Ir US Guide Bx Asp/drain  Result Date: 12/21/2016 INDICATION: 71 year old female with axillary lymphadenopathy. EXAM: IR ULTRASOUND GUIDANCE MEDICATIONS: None. ANESTHESIA/SEDATION: None FLUOROSCOPY TIME:  None COMPLICATIONS: None PROCEDURE: Informed written consent was obtained from the patient after a thorough discussion of the procedural risks, benefits and alternatives. All questions were addressed. Maximal Sterile Barrier Technique was utilized including caps, mask, sterile gowns, sterile gloves, sterile drape, hand hygiene and skin antiseptic. A timeout was performed prior to the initiation of the procedure. Patient positioned supine position on the fluoroscopic table. Ultrasound images of the right axillary region were performed with images stored and sent to PACs. The patient was then prepped and draped in the usual sterile fashion. The skin and subcutaneous tissues were generously infiltrated 1% lidocaine for local anesthesia. Multiple core biopsy were achieved using 18 gauge device. Images were stored. Tissue samples placed in the saline. Patient tolerated the procedure well and remained hemodynamically stable throughout. No complications were encountered and no significant blood loss. IMPRESSION: Status post ultrasound-guided biopsy of right axillary node. Tissue specimen sent to pathology for complete histopathologic analysis. Signed, Dulcy Fanny. Earleen Newport, DO  [facility] Electronically Signed   By:  Corrie Mckusick D.O.   On: 12/21/2016 15:14      Azavion Bouillon M 12/22/2016  Patient ID: Blanche East, female   DOB: [birth year], 71 y.o.   MRN: [id number]

## 2016-12-22 NOTE — Progress Notes (Signed)
  Echocardiogram 2D Echocardiogram has been performed.  Novalynn Branaman L Androw 12/22/2016, 11:32 AM

## 2016-12-22 NOTE — Progress Notes (Signed)
**  Preliminary report by tech**  Bilateral lower extremity venous duplex completed. There is no evidence of deep or superficial vein thrombosis involving the right and left lower extremities. All visualized vessels appear patent and compressible. There is no evidence of Baker's cysts bilaterally.  12/22/16 3:46 PM Carlos Levering RVT

## 2016-12-22 NOTE — Progress Notes (Signed)
Alisha Fisher is doing a little bit better. She now has a drainage catheter in the right side.  She did have a biopsy of a right axillary lymph node. The pathology is pending.  She has been seen by radiation oncology. As always, they have done a fantastic job. She will start palliative radiation to the mediastinum to try to help with obstruction of the right lung.  She has a rash. This looks like a drug rash. I'm not sure which medicine she is on that would have caused this.  She is having some discomfort over on the left leg. There is no swelling in the left leg. However, given the fact that she has malignancy, is basically immobile, and has this infection, I think that she probably is at higher risk for thromboembolic disease. As such, I think we have to check her legs for blood clots.  Her labs looked okay. Her white cell count is 11.1. Hemoglobin 9.9. Platelet 197,000. Her potassium is 4.1. Her creatinine is 0.82.  Her vital signs all are pretty stable. Her blood pressures 144/53.  Her lungs still sound somewhat congested. She does rhonchi bilaterally. She has pretty decent air movement. Cardiac exam is regular rate and rhythm. Abdomen is obese but soft. There is no distention. She has bowel sounds. Her extremities shows no obvious swelling in the left thigh. There is some tenderness to palpation over the left thigh. Skin exam shows this diffuse macular rash. Neurological exam is non-focal.  She has started radiation for the lung. 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 The axillary node biopsy can help [place] out with this.  She is not a candidate for any systemic therapy because of her abdominal infection.  She might be transferred out of the ICU today.  She is very thankful for the wonderful care that she is received from everybody in the ICU.  Lattie Haw, MD  Psalm 54:4

## 2016-12-23 DIAGNOSIS — C3490 Malignant neoplasm of unspecified part of unspecified bronchus or lung: Secondary | ICD-10-CM

## 2016-12-23 LAB — CREATININE, SERUM
Creatinine, Ser: 0.86 mg/dL (ref 0.44–1.00)
GFR calc non Af Amer: >60 mL/min (ref >60)

## 2016-12-23 MED ORDER — FAMOTIDINE 20 MG PO TABS
40.0000 mg | ORAL_TABLET | Freq: Two times a day (BID) | ORAL | Status: DC
  Administered 2016-12-23 – 2016-12-26 (×7): 40 mg via ORAL
  Filled 2016-12-23 (×7): qty 2

## 2016-12-23 NOTE — Progress Notes (Signed)
General Surgery [facility]  Assessment & Plan:  Perforated appendicitis             s/p CT guided pelvic drain placement 12/04/16              s/p CT scan and IR drain placed 12/14/16, left trans gluteal approach - removed yesterday             Tolerating soft diet  Squamous cell carcinoma right lung             Radiation therapy / medical oncology involved  Respiratory failure             TRH addressing  Appreciate TRH involvement and assistance.  Please consider transfer of patient to [facility] service as they are managing the majority of patient's current issues.  Surgery will continue to folllow and manage acute appendicitis, RLQ drainage catheter, and plan eventual surgical intervention if necessary.  Dr. Barry Dienes to follow patient for [place] this week.         Earnstine Regal, MD, [facility]       Office: [phone]    Subjective: Patient in bed, comfortable.  Denies abdominal pain.  Getting ready to eat soft breakfast.  Objective: Vital signs in last 24 hours: Temp:  [97.9 F (36.6 C)-99.1 F (37.3 C)] 98.4 F (36.9 C) (03/25 0402) Pulse Rate:  [60-65] 60 (03/24 2319) Resp:  [13-22] 14 (03/25 0402) BP: (129-166)/(55-89) 156/58 (03/25 0402) SpO2:  [92 %-98 %] 97 % (03/25 0815) Weight:  [119.1 kg (262 lb 9.1 oz)] 119.1 kg (262 lb 9.1 oz) (03/25 0404) Last BM Date:  (pt unsure)  Intake/Output from previous day: 03/24 0701 - 03/25 0700 In: 350 [IV Piggyback:100] Out: 3750 [Urine:3750] Intake/Output this shift: No intake/output data recorded.  Physical Exam: HEENT - sclerae clear, mucous membranes moist Abdomen - soft, obese; non-tender; RLQ drain with small thin feculent output Neuro - alert & oriented, no focal deficits  Lab Results:   Recent Labs  12/21/16 0400 12/22/16 0500  WBC 12.1* 11.2*  HGB 9.7* 9.9*  HCT 30.1* 31.2*  PLT 207 197   BMET  Recent Labs  12/21/16 0400 12/22/16 0500 12/23/16 0447   NA 136 136  --   K 4.2 4.1  --   CL 86* 84*  --   CO2 45* 47*  --   GLUCOSE 87 78  --   BUN 27* 34*  --   CREATININE 0.80 0.82 0.86  CALCIUM 8.4* 8.4*  --    PT/INR No results for input(s): LABPROT, INR in the last 72 hours. Comprehensive Metabolic Panel:    Component Value Date/Time   NA 136 12/22/2016 0500   NA 136 12/21/2016 0400   K 4.1 12/22/2016 0500   K 4.2 12/21/2016 0400   CL 84 (L) 12/22/2016 0500   CL 86 (L) 12/21/2016 0400   CO2 47 (H) 12/22/2016 0500   CO2 45 (H) 12/21/2016 0400   BUN 34 (H) 12/22/2016 0500   BUN 27 (H) 12/21/2016 0400   CREATININE 0.86 12/23/2016 0447   CREATININE 0.82 12/22/2016 0500   GLUCOSE 78 12/22/2016 0500   GLUCOSE 87 12/21/2016 0400   CALCIUM 8.4 (L) 12/22/2016 0500   CALCIUM 8.4 (L) 12/21/2016 0400   AST 24 12/22/2016 0500   AST 13 (L) 12/19/2016 1006   ALT 23 12/22/2016 0500   ALT 11 (L) 12/19/2016 1006   ALKPHOS  59 12/22/2016 0500   ALKPHOS 63 12/19/2016 1006   BILITOT 0.6 12/22/2016 0500   BILITOT 0.5 12/19/2016 1006   PROT 5.5 (L) 12/22/2016 0500   PROT 5.1 (L) 12/19/2016 1006   ALBUMIN 2.4 (L) 12/22/2016 0500   ALBUMIN 2.1 (L) 12/19/2016 1006    Studies/Results: Ct Abdomen Pelvis W Contrast  Result Date: 12/21/2016 CLINICAL DATA:  History of ruptured appendicitis, post percutaneous periappendiceal drainage catheter placement on 12/04/2016 and left trans gluteal approach percutaneous drainage catheter placement on 12/14/2016. EXAM: CT ABDOMEN AND PELVIS WITH CONTRAST TECHNIQUE: Multidetector CT imaging of the abdomen and pelvis was performed using the standard protocol following bolus administration of intravenous contrast. CONTRAST:  100 ISOVUE-300 IOPAMIDOL (ISOVUE-300) INJECTION 61% COMPARISON:  CT-guided periappendiceal abscess drainage catheter placement- 12/04/2016; left fibula approach percutaneous drainage catheter placement - 12/14/2016; CT abdomen pelvis -12/13/2016; 12/03/2016 ; chest CT- 11/14/2016 FINDINGS:  Lower chest: Limited visualization of the lower thorax demonstrates chronic atelectasis / collapse of the right lower lobe. Interval increase in size of small left-sided effusion with complete atelectasis of the imaged portion of the left lower lobe. Interval increase in small right-sided pleural effusion. Cardiomegaly. Coronary artery calcifications. No pericardial effusion. Hepatobiliary: Mild nodularity hepatic contour. No discrete hepatic lesions. Normal appearance of the gallbladder given degree distention. No radiopaque gallstones. No intra extrahepatic bili duct dilatation. No ascites. Pancreas: Normal appearance of the pancreas Spleen: Normal appearance of the spleen. Note is made of a small splenule. Adrenals/Urinary Tract: There is symmetric enhancement and excretion of the bilateral kidneys. The left kidney appears mildly hypertrophied in comparison to the right. No definite renal stones this postcontrast examination. No discrete renal lesions. There is a minimal amount of likely age and body habitus related perinephric stranding. No urinary obstruction. Normal appearance the bilateral adrenal glands. Normal appearance of the urinary bladder given underdistention with a focal catheter. Stomach/Bowel: Partial retraction of Terry appendiceal abscess drainage catheter with radiopaque site marker now projecting external to the right abdominal musculature (image 67, series 2). The dominant component of the pigtail does remain within a residual 3.8 x 1.9 cm periappendiceal fluid collection (coronal image 54, series 5). Interval retraction of left trans gluteal approach percutaneous drainage catheter with radiopaque site marker now within in the central aspect of the left pelvic sidewall musculature (image 75, series 2). The tip of the pigtail drain does remain appropriately positioned within the post a vesicular space. There has been complete resolution of previously noted pelvic collection. A small amount of  fluid is seen within the pelvic cul-de-sac without peripheral wall enhancement. Large colonic stool burden without evidence of enteric obstruction. The colon is again noted to be interposed about the liver edge normal appearance of the terminal ileum. No pneumoperitoneum, pneumatosis or portal venous gas. No new definable/ drainable fluid collections within the abdomen or pelvis. Vascular/Lymphatic: Large amount of irregular mixed calcified and noncalcified atherosclerotic plaque throughout the abdominal aorta. Suspected severe (at least 75%) luminal narrowing involving the right common iliac artery (axial image 58, series 2, coronal image 77, series 5), incompletely evaluated on this non CTA examination. The major branch vessels of the abdominal aorta appear patent on this non CTA examination. Scattered retroperitoneal, pelvic and inguinal lymph nodes are numerous though individually not enlarged by size criteria in presumed reactive due to patient body habitus. Reproductive: Normal appearance of the pelvic organs. No discrete adnexal lesion. Other: Large amount of body wall edema. Musculoskeletal: No acute or aggressive osseous abnormalities. Moderate DDD of L3-L4 with  disc space height loss, endplate irregularity and sclerosis. IMPRESSION: 1. Slight retraction of peri-appendiceal drainage catheter however tip remains within residual approximately 3.8 cm residual air and fluid collection. 2. Interval resolution of pelvic fluid collection following left-sided trans gluteal percutaneous drainage catheter placement. Note, the left-sided percutaneous drainage catheter has also been slightly retracted though tip remains well positioned within the midline of the lower pelvis. 3. No new definable/drainable abdominal or pelvic fluid collections. 4. Large colonic stool burden without evidence of enteric obstruction. 5. Similar appearance of known right lower lobe mass as previously characterized on dedicated chest CT  performed 11/14/2016. 6. Increased bilateral pleural effusions with worsening left basilar atelectasis, though note, underlying infection is not excluded. 7. Cardiomegaly with increased size of bilateral pleural effusions and worsening diffuse body wall edema, constellation of findings worrisome for CHF. 8. Nodularity hepatic contour suggestive of early cirrhotic change. Correlation LFTs is recommended. 9. Aortic Atherosclerosis (ICD10-170.0) Suspected hemodynamically significant stenosis involving the right common iliac artery, incompletely evaluated on this non CTA examination. Correlation for right lower extremity PAD symptoms is recommended. Electronically Signed   By: Sandi Mariscal M.D.   On: 12/21/2016 12:44   Ir Sinus/fist Tube Chk-non Gi  Result Date: 12/21/2016 INDICATION: 71 year old female with a history of ruptured appendicitis and percutaneous drainage. CT drain placed 12/04/2016 EXAM: FLUOROSCOPIC DRAIN INJECTION MEDICATIONS: The patient is currently admitted to the hospital and receiving intravenous antibiotics. The antibiotics were administered within an appropriate time frame prior to the initiation of the procedure. ANESTHESIA/SEDATION: None COMPLICATIONS: None PROCEDURE: Informed written consent was obtained from the patient after a thorough discussion of the procedural risks, benefits and alternatives. All questions were addressed. Sterile technique was utilized including caps, mask, sterile gowns, sterile gloves, sterile drape, hand hygiene and skin antiseptic. A timeout was performed prior to the initiation of the procedure. Patient positioned supine position on the fluoroscopy table. Scout images of the right lower abdomen performed. Contrast was injected through the indwelling drain with images stored. Drain was then flushed and attached to gravity drainage. Patient tolerated procedure well and remained hemodynamically stable throughout. No complications were encountered and no significant  blood loss. FINDINGS: Initial image demonstrates drain within the right lower quadrant. Injection of the drain demonstrates immediate filling of the tubular structure which is connected to the cecum. Small amount of contrast decompressed through the skin site along the tract of the drain. No abscess cavity identified. IMPRESSION: Status post drain injection of right lower quadrant in the region of prior ruptured appendicitis, confirming fistulous connection of the drainage catheter with the appendix. It appears as though the ruptured appendix has closed around the drain, with the current drain acting as a cecostomy tube. These results were called by telephone at the time of interpretation on 12/21/2016 at 2:34 pm to Dr. Earnstine Regal , who verbally acknowledged these results. Signed, Dulcy Fanny. Earleen Newport, DO [facility] Electronically Signed   By: Corrie Mckusick D.O.   On: 12/21/2016 14:35   Ir US Guide Bx Asp/drain  Result Date: 12/21/2016 INDICATION: 71 year old female with axillary lymphadenopathy. EXAM: IR ULTRASOUND GUIDANCE MEDICATIONS: None. ANESTHESIA/SEDATION: None FLUOROSCOPY TIME:  None COMPLICATIONS: None PROCEDURE: Informed written consent was obtained from the patient after a thorough discussion of the procedural risks, benefits and alternatives. All questions were addressed. Maximal Sterile Barrier Technique was utilized including caps, mask, sterile gowns, sterile gloves, sterile drape, hand hygiene and skin antiseptic. A timeout was performed prior to the initiation of  the procedure. Patient positioned supine position on the fluoroscopic table. Ultrasound images of the right axillary region were performed with images stored and sent to PACs. The patient was then prepped and draped in the usual sterile fashion. The skin and subcutaneous tissues were generously infiltrated 1% lidocaine for local anesthesia. Multiple core biopsy were achieved  using 18 gauge device. Images were stored. Tissue samples placed in the saline. Patient tolerated the procedure well and remained hemodynamically stable throughout. No complications were encountered and no significant blood loss. IMPRESSION: Status post ultrasound-guided biopsy of right axillary node. Tissue specimen sent to pathology for complete histopathologic analysis. Signed, Dulcy Fanny. Earleen Newport, DO [facility] Electronically Signed   By: Corrie Mckusick D.O.   On: 12/21/2016 15:14      [place] M 12/23/2016  Patient ID: Alisha Fisher, female   DOB: November 30, 1945, 71 y.o.   MRN: [id number]

## 2016-12-23 NOTE — Progress Notes (Addendum)
CONSULT NOTE    Alisha Fisher   QJJ:[id number]  DOB: 1946-06-30  DOA: 12/03/2016 PCP: Pcp Not In System   Brief Narrative:  This is a 71 year old female with history of hypertension, COPD on chronic home oxygen 2 L nasal cannula, obesity, tobacco abuse, who was admitted on the surgical service on 12/03/2016 with abdominal pain, found to have a ruptured appendix and peritonitis and taken to the operating room.  Initial investigation showed a right lung mass with subsequent endobronchial obstruction with in the right lower lobe, as well as lymphadenopathy.  Pulmonology was consulted, and patient underwent a bronchoscopy on 3/19 with endobronchial FNA, with pathology showing new diagnosis of squamous cell carcinoma.  Oncology was consulted, as well as radiation oncology and there are plans to irradiate this mass since lung collapse puts her at high risk for developing recurrent postobstructive pneumonia.  During her initial surgery, she underwent drain placement into right side of her abdomen and she had culture from the intra-abdominal abscess, which showed enterococcus faecalis which was sensitive to ampicillin, and she completed a course of antibiotics as below.  She had hypoxic and hypercarbic respiratory failure requiring BiPAP use and her hospital course was complicated by significant fluid overload for which she is on IV diuretics.   Interventional radiology was also consulted to evaluate patient for biopsy of her lymph nodes to evaluate for metastatic disease. She underwent an right axillary LN biopsty on 3/23.    Subjective: Rash is less pruritic. Pain in right thigh not as severe.  Assessment & Plan:   Principal Problem:   Acute appendicitis with perforation and peritoneal abscess Active Problems:   Obesity   Smoker   COPD (chronic obstructive pulmonary disease) (HCC)   Intraperitoneal abscess s/p perc drainage 12/14/2016   Hypokalemia   Hypomagnesemia   Endobronchial mass   Acute on  chronic respiratory failure with hypoxia and hypercapnia (HCC)   Essential hypertension   Anemia   Rash and nonspecific skin eruption   Acute pain of left thigh  Acute on chronic hypercarbic and hypoxic respiratory failure -Multifactorial due to COPD, acute on chronic diastolic CHF/ fluid resusitation, - Pulse ox upper 90s on 4 L nasal cannula - RN and respiratory should be weaning O2 - CXR 3/21 suggestive of pulm edema vs pneumonia - added IS, OOB as much as possible   (A)COPD /suspect obstructive sleep apnea -Pulmonology was consulted and have evaluated patient.   -Continue nightly BiPAP -Currently she is on Brovana, Pulmicort, DuoNeb's as well as a prednisone (Solumedrol 3/21 and 3/22)  (B) Fluid overload- possible acute on chronic dCHF - weight 102 kg on 3/5 and 126 kg on 3/21 >> 119 today - Lasix 40 mg IV Q12, - also on Losarta/ HCTZ -  2  D ECHO show grade 2 d CHF-   Rash - generalized on arms, breasts, abdomen- macular in some areas and confluent in others- severely pruritic - she did not have it yesterday -  received a small amount of dye yesterday for drain- other new meds were Tramadol and Phenergan  which I have stopped - already on prednisone 60 mg - add Benadryl and Pepcid - rash not resolved yet but not expanding and less pruritic- may take a few days - of note, had a rash earlier on in the admission attributed to medications  Left ant thigh pain - was occurring at home - was taking 5-6 Ibuprofen to control pain - ? Lateral cutaneous nerve compression-ie Meralgia paresthetica- started Neurontin -  Dr Marin Olp ordered Venous duplex- this is negative  Acute appendicitis with perforation/peritoneal abscess -Initial cultures grew Enterococcus faecalis, patient has completed more than 2 weeks of antibiotics  -Management per primary service   Newly diagnosed non-small cell lung cancer, squamous cell carcinoma of the right upper lobe -Pulmonology consulted-  status post  bronchoscopy on 3/19 with positive FNA -Radiation oncology and oncology are following -We will complete the staging with an MRI of the brain -She will undergo radiation therapy to the mass, she will go for simulation on 12/22/78  Hypertension -Currently on Norvasc, Lasix, Cozaar, HCTZ and metoprolol.  Continue.  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  -avoid narcotics given respiratory status - OOB  Morbid obesity Body mass index is 41.12 kg/m.    DVT prophylaxis: Lovenox  Procedures:  3/19 bronchoscopy w/ endobronchial FNA of bronchus intermedius mass, brushing of RMSB &EBUS w/ FNA  Antimicrobials:  Anti-infectives    Start     Dose/Rate Route Frequency Ordered Stop   12/17/16 1200  vancomycin (VANCOCIN) IVPB 1000 mg/200 mL premix  Status:  Discontinued     1,000 mg 200 mL/hr over 60 Minutes Intravenous Every 12 hours 12/17/16 0634 12/20/16 1044   12/16/16 1000  fluconazole (DIFLUCAN) tablet 200 mg  Status:  Discontinued     200 mg Oral Daily 12/16/16 0737 12/20/16 1046   12/14/16 0600  vancomycin (VANCOCIN) 1,250 mg in sodium chloride 0.9 % 250 mL IVPB  Status:  Discontinued     1,250 mg 166.7 mL/hr over 90 Minutes Intravenous Every 12 hours 12/13/16 1916 12/17/16 0547   12/10/16 1400  vancomycin (VANCOCIN) IVPB 1000 mg/200 mL premix  Status:  Discontinued     1,000 mg 100 mL/hr over 120 Minutes Intravenous Every 12 hours 12/10/16 1059 12/13/16 1916   12/08/16 1800  ertapenem (INVANZ) 1 g in sodium  chloride 0.9 % 50 mL IVPB  Status:  Discontinued     1 g 100 mL/hr over 30 Minutes Intravenous Every 24 hours 12/08/16 1512 12/20/16 1044   12/08/16 1600  vancomycin (VANCOCIN) IVPB 750 mg/150 ml premix  Status:  Discontinued     750 mg 100 mL/hr over 90 Minutes Intravenous Every 12 hours 12/08/16 1513 12/10/16 1059   12/07/16 2300  vancomycin (VANCOCIN) IVPB 750 mg/150 ml premix  Status:  Discontinued     750 mg 150 mL/hr over 60 Minutes Intravenous Every 12 hours 12/07/16 1103 12/08/16 1513   12/07/16 2200  ciprofloxacin (CIPRO) IVPB 400 mg  Status:  Discontinued     400 mg 200 mL/hr over 60 Minutes Intravenous Every 12 hours 12/07/16 1122 12/08/16 1512   12/07/16 1115  vancomycin (VANCOCIN) 2,000 mg in sodium chloride 0.9 % 500 mL IVPB     2,000 mg 250 mL/hr over 120 Minutes Intravenous  Once 12/07/16 1103 12/07/16 1440   12/06/16 2200  ciprofloxacin (CIPRO) IVPB 400 mg  Status:  Discontinued     400 mg 200 mL/hr over 60 Minutes Intravenous Every 12 hours 12/06/16 1409 12/07/16 1039   12/06/16 1400  piperacillin-tazobactam (ZOSYN) IVPB 3.375  g  Status:  Discontinued     3.375 g 12.5 mL/hr over 240 Minutes Intravenous Every 8 hours 12/06/16 0643 12/06/16 0825   12/06/16 1000  ciprofloxacin (CIPRO) IVPB 200 mg  Status:  Discontinued     200 mg 100 mL/hr over 60 Minutes Intravenous Every 12 hours 12/06/16 0825 12/06/16 1409   12/06/16 1000  metroNIDAZOLE (FLAGYL) IVPB 500 mg  Status:  Discontinued     500 mg 100 mL/hr over 60 Minutes Intravenous Every 8 hours 12/06/16 0825 12/08/16 1512   12/04/16 0630  piperacillin-tazobactam (ZOSYN) 3.375 g in dextrose 5 % 50 mL IVPB  Status:  Discontinued     3.375 g 12.5 mL/hr over 240 Minutes Intravenous Every 8 hours 12/04/16 0623 12/06/16 0643   12/03/16 2130  piperacillin-tazobactam (ZOSYN) IVPB 3.375 g  Status:  Discontinued     3.375 g 12.5 mL/hr over 240 Minutes Intravenous Every 8 hours 12/03/16 2125 12/04/16 0623   12/03/16 1915   piperacillin-tazobactam (ZOSYN) IVPB 3.375 g     3.375 g 100 mL/hr over 30 Minutes Intravenous  Once 12/03/16 1907 12/03/16 2005       Objective: Vitals:   12/23/16 0000 12/23/16 0402 12/23/16 0404 12/23/16 0600  BP: (!) 145/89 (!) 156/58    Pulse:      Resp: 18 14    Temp: 98.6 F (37 C) 98.4 F (36.9 C)    TempSrc: Core (Comment) Core (Comment)    SpO2: 96% 93%  97%  Weight:   119.1 kg (262 lb 9.1 oz)   Height:        Intake/Output Summary (Last 24 hours) at 12/23/16 0815 Last data filed at 12/23/16 0700  Gross per 24 hour  Intake              350 ml  Output             3750 ml  Net            -3400 ml   Filed Weights   12/20/16 0500 12/21/16 0413 12/23/16 0404  Weight: 123.2 kg (271 lb 9.7 oz) 123.4 kg (272 lb 0.8 oz) 119.1 kg (262 lb 9.1 oz)    Examination: General exam: Appears comfortable  HEENT: PERRLA, oral mucosa moist, no sclera icterus or thrush Respiratory system: Clear to auscultation. Respiratory effort normal. Cardiovascular system: S1 & S2 heard, RRR.  No murmurs  Gastrointestinal system: Abdomen soft, non-tender, nondistended. Normal bowel sound. Drain in RLQ with yellowish discharge Central nervous system: Alert and oriented. No focal neurological deficits. Extremities: No cyanosis, clubbing or edema Skin: RASH: generalized on arms, breasts, abdomen- macular in some areas and confluent in others Psychiatry:  Mood & affect appropriate.     Data Reviewed: I have personally reviewed following labs and imaging studies  CBC:  Recent Labs Lab 12/17/16 0450 12/19/16 1006 12/20/16 0500 12/21/16 0400 12/22/16 0500  WBC 13.0* 10.1 11.9* 12.1* 11.2*  NEUTROABS 9.6*  --   --   --   --   HGB 11.3* 10.1* 9.8* 9.7* 9.9*  HCT 34.7* 31.6* 30.4* 30.1* 31.2*  MCV 94.6 96.9 95.9 95.9 96.6  PLT 336 229 213 207 440   Basic Metabolic Panel:  Recent Labs Lab 12/17/16 0450 12/18/16 0458  12/19/16 1006 12/20/16 0500 12/21/16 0400 12/22/16 0500  12/23/16 0447  NA 140 139  --  140 135 136 136  --   K 4.3 4.3  --  3.8 4.5 4.2 4.1  --   CL  101 98*  --  100* 88* 86* 84*  --   CO2 34* 36*  --  35* 42* 45* 47*  --   GLUCOSE 95 120*  --  109* 112* 87 78  --   BUN 9 11  --  14 21* 27* 34*  --   CREATININE 0.61 0.70  < > 0.63 0.92 0.80 0.82 0.86  CALCIUM 8.1* 8.4*  --  7.4* 8.3* 8.4* 8.4*  --   MG 1.9  --   --   --   --   --   --   --   < > = values in this interval not displayed. GFR: Estimated Creatinine Clearance: 82.5 mL/min (by C-G formula based on SCr of 0.86 mg/dL). Liver Function Tests:  Recent Labs Lab 12/19/16 1006 12/22/16 0500  AST 13* 24  ALT 11* 23  ALKPHOS 63 59  BILITOT 0.5 0.6  PROT 5.1* 5.5*  ALBUMIN 2.1* 2.4*   No results for input(s): LIPASE, AMYLASE in the last 168 hours. No results for input(s): AMMONIA in the last 168 hours. Coagulation Profile: No results for input(s): INR, PROTIME in the last 168 hours. Cardiac Enzymes: No results for input(s): CKTOTAL, CKMB, CKMBINDEX, TROPONINI in the last 168 hours. BNP (last 3 results) No results for input(s): PROBNP in the last 8760 hours. HbA1C: No results for input(s): HGBA1C in the last 72 hours. CBG:  Recent Labs Lab 12/17/16 0447 12/21/16 0751 12/21/16 0800 12/21/16 1531 12/21/16 1921  GLUCAP 77 111* 82 103* 152*   Lipid Profile: No results for input(s): CHOL, HDL, LDLCALC, TRIG, CHOLHDL, LDLDIRECT in the last 72 hours. Thyroid Function Tests: No results for input(s): TSH, T4TOTAL, FREET4, T3FREE, THYROIDAB in the last 72 hours. Anemia Panel: No results for input(s): VITAMINB12, FOLATE, FERRITIN, TIBC, IRON, RETICCTPCT in the last 72 hours. Urine analysis:    Component Value Date/Time   COLORURINE YELLOW 12/04/2016 0425   APPEARANCEUR HAZY (A) 12/04/2016 0425   LABSPEC >1.046 (H) 12/04/2016 0425   PHURINE 5.0 12/04/2016 0425   GLUCOSEU NEGATIVE 12/04/2016 0425   HGBUR NEGATIVE 12/04/2016 0425   BILIRUBINUR NEGATIVE 12/04/2016 0425    KETONESUR NEGATIVE 12/04/2016 0425   PROTEINUR NEGATIVE 12/04/2016 0425   NITRITE NEGATIVE 12/04/2016 0425   LEUKOCYTESUR NEGATIVE 12/04/2016 0425   Sepsis Labs: '@LABRCNTIP'$ (procalcitonin:4,lacticidven:4) ) Recent Results (from the past 240 hour(s))  MRSA PCR Screening     Status: None   Collection Time: 12/19/16 10:06 AM  Result Value Ref Range Status   MRSA by PCR NEGATIVE NEGATIVE Final    Comment:        The GeneXpert MRSA Assay (FDA approved for NASAL specimens only), is one component of a comprehensive MRSA colonization surveillance program. It is not intended to diagnose MRSA infection nor to guide or monitor treatment for MRSA infections.          Radiology Studies: Ct Abdomen Pelvis W Contrast  Result Date: 12/21/2016 CLINICAL DATA:  History of ruptured appendicitis, post percutaneous periappendiceal drainage catheter placement on 12/04/2016 and left trans gluteal approach percutaneous drainage catheter placement on 12/14/2016. EXAM: CT ABDOMEN AND PELVIS WITH CONTRAST TECHNIQUE: Multidetector CT imaging of the abdomen and pelvis was performed using the standard protocol following bolus administration of intravenous contrast. CONTRAST:  100 ISOVUE-300 IOPAMIDOL (ISOVUE-300) INJECTION 61% COMPARISON:  CT-guided periappendiceal abscess drainage catheter placement- 12/04/2016; left fibula approach percutaneous drainage catheter placement - 12/14/2016; CT abdomen pelvis -12/13/2016; 12/03/2016 ; chest CT- 11/14/2016 FINDINGS: Lower chest: Limited visualization of the  lower thorax demonstrates chronic atelectasis / collapse of the right lower lobe. Interval increase in size of small left-sided effusion with complete atelectasis of the imaged portion of the left lower lobe. Interval increase in small right-sided pleural effusion. Cardiomegaly. Coronary artery calcifications. No pericardial effusion. Hepatobiliary: Mild nodularity hepatic contour. No discrete hepatic lesions. Normal  appearance of the gallbladder given degree distention. No radiopaque gallstones. No intra extrahepatic bili duct dilatation. No ascites. Pancreas: Normal appearance of the pancreas Spleen: Normal appearance of the spleen. Note is made of a small splenule. Adrenals/Urinary Tract: There is symmetric enhancement and excretion of the bilateral kidneys. The left kidney appears mildly hypertrophied in comparison to the right. No definite renal stones this postcontrast examination. No discrete renal lesions. There is a minimal amount of likely age and body habitus related perinephric stranding. No urinary obstruction. Normal appearance the bilateral adrenal glands. Normal appearance of the urinary bladder given underdistention with a focal catheter. Stomach/Bowel: Partial retraction of Terry appendiceal abscess drainage catheter with radiopaque site marker now projecting external to the right abdominal musculature (image 67, series 2). The dominant component of the pigtail does remain within a residual 3.8 x 1.9 cm periappendiceal fluid collection (coronal image 54, series 5). Interval retraction of left trans gluteal approach percutaneous drainage catheter with radiopaque site marker now within in the central aspect of the left pelvic sidewall musculature (image 75, series 2). The tip of the pigtail drain does remain appropriately positioned within the post a vesicular space. There has been complete resolution of previously noted pelvic collection. A small amount of fluid is seen within the pelvic cul-de-sac without peripheral wall enhancement. Large colonic stool burden without evidence of enteric obstruction. The colon is again noted to be interposed about the liver edge normal appearance of the terminal ileum. No pneumoperitoneum, pneumatosis or portal venous gas. No new definable/ drainable fluid collections within the abdomen or pelvis. Vascular/Lymphatic: Large amount of irregular mixed calcified and noncalcified  atherosclerotic plaque throughout the abdominal aorta. Suspected severe (at least 75%) luminal narrowing involving the right common iliac artery (axial image 58, series 2, coronal image 77, series 5), incompletely evaluated on this non CTA examination. The major branch vessels of the abdominal aorta appear patent on this non CTA examination. Scattered retroperitoneal, pelvic and inguinal lymph nodes are numerous though individually not enlarged by size criteria in presumed reactive due to patient body habitus. Reproductive: Normal appearance of the pelvic organs. No discrete adnexal lesion. Other: Large amount of body wall edema. Musculoskeletal: No acute or aggressive osseous abnormalities. Moderate DDD of L3-L4 with disc space height loss, endplate irregularity and sclerosis. IMPRESSION: 1. Slight retraction of peri-appendiceal drainage catheter however tip remains within residual approximately 3.8 cm residual air and fluid collection. 2. Interval resolution of pelvic fluid collection following left-sided trans gluteal percutaneous drainage catheter placement. Note, the left-sided percutaneous drainage catheter has also been slightly retracted though tip remains well positioned within the midline of the lower pelvis. 3. No new definable/drainable abdominal or pelvic fluid collections. 4. Large colonic stool burden without evidence of enteric obstruction. 5. Similar appearance of known right lower lobe mass as previously characterized on dedicated chest CT performed 11/14/2016. 6. Increased bilateral pleural effusions with worsening left basilar atelectasis, though note, underlying infection is not excluded. 7. Cardiomegaly with increased size of bilateral pleural effusions and worsening diffuse body wall edema, constellation of findings worrisome for CHF. 8. Nodularity hepatic contour suggestive of early cirrhotic change. Correlation LFTs is recommended. 9. Aortic Atherosclerosis (  ICD10-170.0) Suspected  hemodynamically significant stenosis involving the right common iliac artery, incompletely evaluated on this non CTA examination. Correlation for right lower extremity PAD symptoms is recommended. Electronically Signed   By: Sandi Mariscal M.D.   On: 12/21/2016 12:44   Ir Sinus/fist Tube Chk-non Gi  Result Date: 12/21/2016 INDICATION: 71 year old female with a history of ruptured appendicitis and percutaneous drainage. CT drain placed 12/04/2016 EXAM: FLUOROSCOPIC DRAIN INJECTION MEDICATIONS: The patient is currently admitted to the hospital and receiving intravenous antibiotics. The antibiotics were administered within an appropriate time frame prior to the initiation of the procedure. ANESTHESIA/SEDATION: None COMPLICATIONS: None PROCEDURE: Informed written consent was obtained from the patient after a thorough discussion of the procedural risks, benefits and alternatives. All questions were addressed. Sterile technique was utilized including caps, mask, sterile gowns, sterile gloves, sterile drape, hand hygiene and skin antiseptic. A timeout was performed prior to the initiation of the procedure. Patient positioned supine position on the fluoroscopy table. Scout images of the right lower abdomen performed. Contrast was injected through the indwelling drain with images stored. Drain was then flushed and attached to gravity drainage. Patient tolerated procedure well and remained hemodynamically stable throughout. No complications were encountered and no significant blood loss. FINDINGS: Initial image demonstrates drain within the right lower quadrant. Injection of the drain demonstrates immediate filling of the tubular structure which is connected to the cecum. Small amount of contrast decompressed through the skin site along the tract of the drain. No abscess cavity identified. IMPRESSION: Status post drain injection of right lower quadrant in the region of prior ruptured appendicitis, confirming fistulous  connection of the drainage catheter with the appendix. It appears as though the ruptured appendix has closed around the drain, with the current drain acting as a cecostomy tube. These results were called by telephone at the time of interpretation on 12/21/2016 at 2:34 pm to Dr. Earnstine Regal , who verbally acknowledged these results. Signed, Dulcy Fanny. Earleen Newport, DO [facility] Radiology Electronically Signed   By: Corrie Mckusick D.O.   On: 12/21/2016 14:35   Ir US Guide Bx Asp/drain  Result Date: 12/21/2016 INDICATION: 71 year old female with axillary lymphadenopathy. EXAM: IR ULTRASOUND GUIDANCE MEDICATIONS: None. ANESTHESIA/SEDATION: None FLUOROSCOPY TIME:  None COMPLICATIONS: None PROCEDURE: Informed written consent was obtained from the patient after a thorough discussion of the procedural risks, benefits and alternatives. All questions were addressed. Maximal Sterile Barrier Technique was utilized including caps, mask, sterile gowns, sterile gloves, sterile drape, hand hygiene and skin antiseptic. A timeout was performed prior to the initiation of the procedure. Patient positioned supine position on the fluoroscopic table. Ultrasound images of the right axillary region were performed with images stored and sent to PACs. The patient was then prepped and draped in the usual sterile fashion. The skin and subcutaneous tissues were generously infiltrated 1% lidocaine for local anesthesia. Multiple core biopsy were achieved using 18 gauge device. Images were stored. Tissue samples placed in the saline. Patient tolerated the procedure well and remained hemodynamically stable throughout. No complications were encountered and no significant blood loss. IMPRESSION: Status post ultrasound-guided biopsy of right axillary node. Tissue specimen sent to pathology for complete histopathologic analysis. Signed, Dulcy Fanny. Earleen Newport, DO [facility] Radiology Electronically Signed   By: Corrie Mckusick D.O.   On: 12/21/2016 15:14      Scheduled Meds: . sodium chloride   Intravenous Once  . amLODipine  10 mg Oral Daily  . arformoterol  15 mcg Nebulization Q12H  . budesonide (PULMICORT) nebulizer solution  0.5 mg Nebulization Q12H  . chlorhexidine  15 mL Mouth Rinse BID  . cholecalciferol  1,000 Units Oral Daily  . citalopram  10 mg Oral Daily  . enoxaparin (LOVENOX) injection  40 mg Subcutaneous Q24H  . famotidine  40 mg Oral BID  . furosemide  40 mg Intravenous Q12H  . gabapentin  100 mg Oral TID  . losartan  100 mg Oral Daily   And  . hydrochlorothiazide  25 mg Oral Daily  . ipratropium-albuterol  3 mL Nebulization TID  . lip balm  1 application Topical BID  . mouth rinse  15 mL Mouth Rinse q12n4p  . metoprolol  150 mg Oral Daily  . pantoprazole  40 mg Oral Q1200  . polyethylene glycol  17 g Oral Daily  . predniSONE  60 mg Oral Q breakfast  . protein supplement shake  11 oz Oral Q24H  . saccharomyces boulardii  250 mg Oral BID  . sodium chloride flush  3 mL Intravenous Q12H   Continuous Infusions:   LOS: 20 days    Time spent in minutes: 42    [place], MD [facility] Pager: [email or web address] Password [facility] 12/23/2016, 8:15 AM

## 2016-12-24 ENCOUNTER — Ambulatory Visit
Admit: 2016-12-24 | Discharge: 2016-12-24 | Disposition: A | Discharge status: Home / Self Care | Payer: Medicare Other | Attending: Radiation Oncology | Admitting: Radiation Oncology

## 2016-12-24 ENCOUNTER — Inpatient Hospital Stay (HOSPITAL_COMMUNITY): Payer: Medicare Other

## 2016-12-24 DIAGNOSIS — Z9889 Other specified postprocedural states: Secondary | ICD-10-CM

## 2016-12-24 DIAGNOSIS — E876 Hypokalemia: Secondary | ICD-10-CM

## 2016-12-24 LAB — CBC
HEMATOCRIT: 29.9 % — AB (ref 36.0–46.0)
HEMOGLOBIN: 9.9 g/dL — AB (ref 12.0–15.0)
MCH: 30.8 pg (ref 26.0–34.0)
MCHC: 33.1 g/dL (ref 30.0–36.0)
MCV: 93.1 fL (ref 78.0–100.0)
Platelets: 203 10*3/uL (ref 150–400)
RBC: 3.21 MIL/uL — ABNORMAL LOW (ref 3.87–5.11)
RDW: 14.7 % (ref 11.5–15.5)
WBC: 7.5 10*3/uL (ref 4.0–10.5)

## 2016-12-24 LAB — DIFFERENTIAL
Basophils Absolute: 0 10*3/uL (ref 0.0–0.1)
Basophils Relative: 0 %
EOS ABS: 0.2 10*3/uL (ref 0.0–0.7)
Eosinophils Relative: 2 %
LYMPHS ABS: 1.2 10*3/uL (ref 0.7–4.0)
Lymphocytes Relative: 16 %
MONOS PCT: 9 %
Monocytes Absolute: 0.7 10*3/uL (ref 0.1–1.0)
Neutro Abs: 5.4 10*3/uL (ref 1.7–7.7)
Neutrophils Relative %: 73 %

## 2016-12-24 LAB — BASIC METABOLIC PANEL
ANION GAP: 8 (ref 5–15)
BUN: 36 mg/dL — ABNORMAL HIGH (ref 6–20)
CO2: 47 mmol/L — AB (ref 22–32)
Calcium: 8.5 mg/dL — ABNORMAL LOW (ref 8.9–10.3)
Chloride: 80 mmol/L — ABNORMAL LOW (ref 101–111)
Creatinine, Ser: 0.9 mg/dL (ref 0.44–1.00)
GFR calc Af Amer: >60 mL/min (ref >60)
GFR calc non Af Amer: >60 mL/min (ref >60)
GLUCOSE: 80 mg/dL (ref 65–99)
Potassium: 3.4 mmol/L — ABNORMAL LOW (ref 3.5–5.1)
Sodium: 135 mmol/L (ref 135–145)

## 2016-12-24 LAB — MAGNESIUM: Magnesium: 1.6 mg/dL — ABNORMAL LOW (ref 1.7–2.4)

## 2016-12-24 MED ORDER — LORAZEPAM 2 MG/ML IJ SOLN
1.0000 mg | INTRAMUSCULAR | Status: DC | PRN
  Administered 2016-12-24: 1 mg via INTRAVENOUS
  Filled 2016-12-24: qty 1

## 2016-12-24 MED ORDER — LORAZEPAM BOLUS VIA INFUSION: 1.0000 mg | INTRAVENOUS | Status: DC | PRN

## 2016-12-24 MED ORDER — PREDNISONE 20 MG PO TABS
40.0000 mg | ORAL_TABLET | Freq: Every day | ORAL | Status: DC
  Administered 2016-12-25: 40 mg via ORAL
  Filled 2016-12-24: qty 2

## 2016-12-24 MED ORDER — GADOBENATE DIMEGLUMINE 529 MG/ML IV SOLN
20.0000 mL | Freq: Once | INTRAVENOUS | Status: AC | PRN
  Administered 2016-12-24: 20 mL via INTRAVENOUS

## 2016-12-24 MED ORDER — POTASSIUM CHLORIDE CRYS ER 20 MEQ PO TBCR
40.0000 meq | EXTENDED_RELEASE_TABLET | ORAL | Status: AC
  Administered 2016-12-24 (×2): 40 meq via ORAL
  Filled 2016-12-24 (×2): qty 2

## 2016-12-24 NOTE — Progress Notes (Signed)
qPhysical Therapy Treatment Patient Details Name: Alisha Fisher MRN: [id number] DOB: 1946-02-20 Today's Date: 12/24/2016    History of Present Illness Alisha Fisher is a 71 y.o. female ,former smoker, with history of hypertension, COPD, who presented to ED 12/03/16 with 3 day history of right lower quadrant abdominal pain. Subsequent imaging revealed. Perforated appendicitis. 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    PT Comments    Pt OOB in recliner on 2 lts nasal at 93%.  Pt very weak from extended LOS.  + 2 assist to amb a limited distance in hallway.  Avr sats on 2 lts during amb was 88%.  50% VC's on purse lip breathing and second assist following with recliner to decrease pt's aniety/fear of falling/fear of B LE weakness.   Pt very deconditioned from extended LOS and will need [facility] at [facility].     Follow Up Recommendations  SNF;Supervision/Assistance - 24 hour     Equipment Recommendations  Rolling walker with 5" wheels    Recommendations for Other Services       Precautions / Restrictions Precautions Precautions: Fall Precaution Comments: R LQ drain, home O2, incont B/B Restrictions Weight Bearing Restrictions: No    Mobility  Bed Mobility               General bed mobility comments: OOB in recliner  Transfers Overall transfer level: Needs assistance Equipment used: Rolling walker (2 wheeled) Transfers: Sit to/from Stand Sit to Stand: +2 physical assistance;+2 safety/equipment;Mod assist         General transfer comment: 50% VC's on proper hand placement to push self up from recliner arm rests.  50% VC's to reach back prior to sit.  Pt required increased time and effort to rise.    Ambulation/Gait Ambulation/Gait assistance: Min assist;+2 physical assistance;+2 safety/equipment Ambulation Distance (Feet): 26 Feet (13 feet x 2 one sitting rest break) Assistive device: Rolling walker (2 wheeled) Gait  Pattern/deviations: Step-to pattern;Step-through pattern Gait velocity: decreased   General Gait Details: max encouragement to amb in hallway with recliner following closely behind to decrease her anxiety.     Stairs            Wheelchair Mobility    Modified Rankin (Stroke Patients Only)       Balance                                            Cognition Arousal/Alertness: Awake/alert Behavior During Therapy: Anxious Overall Cognitive Status: Within Functional Limits for tasks assessed                                 General Comments: some expressive aphagia/word finding issues.   Break ups in mid sentence.         Exercises      General Comments        Pertinent Vitals/Pain Pain Assessment: No/denies pain    Home Living                      Prior Function            PT Goals (current goals can now be found in the care plan section) Progress towards PT goals: Progressing toward goals    Frequency  Min 3X/week      PT Plan Current plan remains appropriate    Co-evaluation             End of Session Equipment Utilized During Treatment: Oxygen Activity Tolerance: Patient limited by fatigue;Other (comment) (anxiety/fear of B LE weakness/fall) Patient left: in chair;with call bell/phone within reach;with chair alarm set Nurse Communication: Mobility status PT Visit Diagnosis: Pain;Unsteadiness on feet (R26.81)     Time: 0034-9179 PT Time Calculation (min) (ACUTE ONLY): 28 min  Charges:  $Gait Training: 8-22 mins $Therapeutic Exercise: 8-22 mins                    G Codes:         Rica Koyanagi  PTA [facility]  Acute  Rehab Pager      [phone]

## 2016-12-24 NOTE — Progress Notes (Signed)
7 Days Post-Op  Subjective: She looks pretty good this AM.  Sitting up in the chair, and trying to wash up and talk with her sister on the phone. She looks pretty comfortable on the O2.  Still has some edema lower legs, right greater than left, buttocks and dependent areas.  Lasix has been discontinued.  She reports she starting Radiation Rx today.  Path on bx is still pending.  She has bed offers for SNF.   Objective: Vital signs in last 24 hours: Temp:  [97.9 F (36.6 C)-99.1 F (37.3 C)] 98.1 F (36.7 C) (03/26 0505) Pulse Rate:  [65-72] 65 (03/26 0505) Resp:  [13-20] 18 (03/26 0505) BP: (138-169)/(60-101) 169/66 (03/26 0505) SpO2:  [91 %-96 %] 96 % (03/26 0847) Weight:  [107.9 kg (237 lb 14 oz)] 107.9 kg (237 lb 14 oz) (03/26 0505) Last BM Date: 12/23/16 560 PO yesterday Weight:   down to 107.9 Kg   102 on admit, up to 126 Kg on 12/18/16 Urine 5175, drains 20 Afebrile, VSS K+ 3.4 WBC is 7.5 h/H is stable    Intake/Output from previous day: 03/25 0701 - 03/26 0700 In: 565 [P.O.:560] Out: 5195 [Urine:5175; Drains:20] Intake/Output this shift: Total I/O In: 360 [P.O.:360] Out: 1700 [Urine:1700]  General appearance: alert, cooperative and no distress Resp: clear to auscultation bilaterally and uppper airways, decreased in the bases.  Not wheezing right now. GI: soft, non-tender; bowel sounds normal; no masses,  no organomegaly and she is sitting on the drain and I cannot get to it.    Edema:  Improving,  No complaints of lower leg pain now.    Lab Results:   Recent Labs  12/22/16 0500 12/24/16 0354  WBC 11.2* 7.5  HGB 9.9* 9.9*  HCT 31.2* 29.9*  PLT 197 203    BMET  Recent Labs  12/22/16 0500 12/23/16 0447 12/24/16 0354  NA 136  --  135  K 4.1  --  3.4*  CL 84*  --  80*  CO2 47*  --  47*  GLUCOSE 78  --  80  BUN 34*  --  36*  CREATININE 0.82 0.86 0.90  CALCIUM 8.4*  --  8.5*   PT/INR No results for input(s): LABPROT, INR in the last 72  hours.   Recent Labs Lab 12/19/16 1006 12/22/16 0500  AST 13* 24  ALT 11* 23  ALKPHOS 63 59  BILITOT 0.5 0.6  PROT 5.1* 5.5*  ALBUMIN 2.1* 2.4*     Lipase     Component Value Date/Time   LIPASE 15 12/03/2016 1613     Studies/Results: No results found.  Medications: . sodium chloride   Intravenous Once  . amLODipine  10 mg Oral Daily  . arformoterol  15 mcg Nebulization Q12H  . budesonide (PULMICORT) nebulizer solution  0.5 mg Nebulization Q12H  . chlorhexidine  15 mL Mouth Rinse BID  . cholecalciferol  1,000 Units Oral Daily  . citalopram  10 mg Oral Daily  . enoxaparin (LOVENOX) injection  40 mg Subcutaneous Q24H  . famotidine  40 mg Oral BID  . gabapentin  100 mg Oral TID  . losartan  100 mg Oral Daily   And  . hydrochlorothiazide  25 mg Oral Daily  . ipratropium-albuterol  3 mL Nebulization TID  . lip balm  1 application Topical BID  . mouth rinse  15 mL Mouth Rinse q12n4p  . metoprolol  150 mg Oral Daily  . pantoprazole  40 mg Oral Q1200  .  polyethylene glycol  17 g Oral Daily  . potassium chloride  40 mEq Oral Q4H  . [START ON 12/25/2016] predniSONE  40 mg Oral Q breakfast  . protein supplement shake  11 oz Oral Q24H  . saccharomyces boulardii  250 mg Oral BID  . sodium chloride flush  3 mL Intravenous Q12H    Assessment/Plan Perforated appendicitis s/p CT guided pelvic drain placement 12/04/16/ CT scan 12/21/16 drain in cecum =>> cecostomy tube s/p CT scan and IR drain placed 12/14/16, left trans gluteal approach  CT chest 12/03/16 =>>RLL mass =>>Bronchus Intermedius Endobronchial Obstruction S/p Bronchoscopy with Airway Inspection,Endobronchial Fine Needle Aspiration Bronchus Intermedius Mass Endobronchial Brushings Right Mainstem Bronchus Endobronchial Ultrasound with Needle Aspiration 12/17/16, DR. Nestor BRONCHIAL BRUSHING, RIGHT MAINSTEM(SPECIMEN 4 OF 4 COLLECTED 12/17/16): SQUAMOUS CELL CARCINOMA. Radiation Rx: being started IR axillary node bx:   Pending  Over sedation on anxiolytics with respiratory distress - Bipap 12/18/16 COPD - on O2 at home - currently sats in 90's on 4 liters  BiPap at night Probable OSA Recent pneumonia - earlier this year Former smoker - quit earlier this year  Hospital rash - Concerns with CIPRO/Flagyl and Zosyn allergies  Anasarca/ very positive fluid balance - fluid overload - lasix 40 mg q12h  Day 7 of Lasix - weights noted above  -  Discontinued after AM dose this AM Malnutrition - Prealbumin 14.7 HTN - Body mass index is 42.5 Lower extremity dopplers - negative on 12/22/16 2D Echol 12/22/16:Systolic function was   vigorous. The estimated ejection fraction was in the range of 65%   to 70%. Wall motion was normal; there were no regional wall   motion abnormalities. Features are consistent with a pseudonormal   left ventricular filling pattern, with concomitant abnormal   relaxation and increased filling pressure (grade 2 diastolic   dysfunction). Mild TR, trivial pericardial effusion and large left pleural effusion  FEN:No IV fluids/soft diet =>> NPO for procedures ID:day 16antibiotics Zosyn 12/03/16-12/05/16; Cipro/flagyl 3/8-3/10/18; Both stopped for rash/possible allergy  - rash is improving Invanz 3/10 =>>day 12Vancomycin 3/09=>>day14Diflucan 3/18 =>>day 5 She did not get Invanz or diflucan 1 day during this course.  DVT: Lovenox   Plan:  Awaiting results of biopsy, Radiation Rx to start.  Will discuss with Dr. Barry Dienes, after we get opinions from Pulmonary, Medicine and oncology about next steps.  She has bed offers to SNF.   LOS: 21 days    Minie Roadcap 12/24/2016 [phone]

## 2016-12-24 NOTE — Progress Notes (Signed)
CONSULT NOTE    Alisha Fisher   ZOX:[id number]  DOB: Oct 03, 1945  DOA: 12/03/2016 PCP: Pcp Not In System   Brief Narrative:  This is a 71 year old female with history of hypertension, COPD on chronic home oxygen 2 L nasal cannula, obesity, tobacco abuse, HTN who was admitted on Feb 14 at an outside hospital for pneumonia and was found to have a R lung mass and evaluated by pulmonary. The plan was for the patient to undergo bronchoscopy on 3/7.   She was admitted to [facility] on the surgical service on 12/03/2016 with RLQ abdominal pain, found to have a ruptured appendix and underwent CT guided drainage and perc drain placement by IR on 12/05/15.  CT repeated on 3/15 and left pelvic drain placed on 3/16.  Culture from the intra-abdominal abscess  showed enterococcus faecalis which was sensitive to ampicillin, and she completed a course of antibiotics as below.    Pulmonology was consulted for R lung mass, and patient underwent a bronchoscopy on 3/19 with endobronchial FNA, with pathology showing new diagnosis of squamous cell carcinoma.  Medical oncology and radiation oncology consulted as well.  She had hypoxic and hypercarbic respiratory failure requiring BiPAP use and her hospital course was complicated by significant fluid overload for which she is on IV diuretics.   Interventional radiology was also consulted to evaluate patient for biopsy of her lymph nodes to evaluate for metastatic disease. She underwent an right axillary LN biopsty on 3/23.    Subjective: Rash resolved. Applying ice packs when pain in right ant thigh becomes severe. This is helping her.  Has not ambulated. Agreeable to go to Rehab. Son lives with her but works.   Assessment & Plan:   Principal Problem:   Acute appendicitis with perforation and peritoneal abscess Active Problems:   Obesity   Smoker   COPD (chronic obstructive pulmonary disease) (HCC)   Intraperitoneal abscess s/p perc drainage 12/14/2016  Hypokalemia   Hypomagnesemia   Endobronchial mass   Acute on chronic respiratory failure with hypoxia and hypercapnia (HCC)   Essential hypertension   Anemia   Rash and nonspecific skin eruption   Acute pain of left thigh  Acute on chronic hypercarbic and hypoxic respiratory failure -Multifactorial due to COPD, acute on chronic diastolic CHF/ fluid resusitation, - Pulse ox upper 90s on 4 L nasal cannula  - CXR 3/21 suggestive of pulm edema vs pneumonia - added IS, OOB as much as possible - weaned down to 2 L this AM which is her baseline   (A)COPD /suspect obstructive sleep apnea 04/12/12: FEV1 was 1.1 (47% predicted) and TLC 75%, DLCO 63%-  felt that this was due to a mix of COPD and obesity.  -Pulmonology was consulted  -Currently she is on Brovana, Pulmicort, DuoNeb's as well as a prednisone (Solumedrol 3/21 and 3/22) - taper Prednisone to 40 mg   (B) Fluid overload- possible acute on chronic dCHF - weight 102 kg on 3/5 and 126 kg on 3/21 >> 119 >> 107  -  2  D ECHO show grade 2 d CHF - will stop Lasix 40 mg IV Q12 -  - also on Losartan/ HCTZ which can be continued - will need daily weights at SNF  (C) Newly diagnosed non-small cell lung cancer, 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 -Pulmonology consulted-  status post bronchoscopy on 3/19 with positive FNA -Radiation oncology and oncology are following -We will complete the staging with an MRI of the brain -She will undergo  radiation therapy to the mass, she will go for simulation on 12/22/78  Rash - generalized on arms, breasts, abdomen- macular in some areas and confluent in others- severely pruritic - she did not have it yesterday -  received a small amount of dye the day before for drain- other new meds were Tramadol and Phenergan  which I have stopped - already on prednisone 60 mg - add Benadryl and Pepcid  - of note, had a rash earlier on in the admission attributed to medications - 3/26 AM- rash resolved-  stop Benadryl, taper Prednisone to 40 mg, cont Pepcid for GI protection  Left ant thigh pain - was occurring at home - was taking 5-6 Ibuprofen to control pain - ? Lateral cutaneous nerve compression-ie Meralgia paresthetica- started Neurontin titrate up as needed - Dr Marin Olp ordered Venous duplex- this is negative - cont Ice packs  Acute appendicitis with perforation/peritoneal abscess -Initial cultures grew Enterococcus faecalis, patient has completed more than 2 weeks of antibiotics  -Management per primary service   Hypertension -Currently on Norvasc, Cozaar, HCTZ and metoprolol.  Continue.  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  -avoid narcotics given respiratory status - OOB  Morbid obesity Body mass index is 37.26 kg/m.    DVT prophylaxis: Lovenox  Procedures:    12/04/16 CT IMAGE GUIDED DRAINAGE BY PERCUTANEOUS CATHETER [SAY3016 (Custom)]      12/14/16  CT PELVIC DRAIN VIA LEFT TRANSGLUTEAL APPROACH  12/17/16 Bronchoscopy w/ endobronchial FNA of bronchus intermedius mass, brushing of RMSB &EBUS w/ FNA  Antimicrobials:  Anti-infectives    Start     Dose/Rate Route Frequency Ordered Stop   12/17/16 1200  vancomycin (VANCOCIN) IVPB 1000 mg/200 mL premix  Status:  Discontinued     1,000 mg 200 mL/hr over 60 Minutes Intravenous Every 12 hours 12/17/16 0634 12/20/16 1044   12/16/16 1000  fluconazole (DIFLUCAN) tablet 200 mg  Status:  Discontinued     200 mg Oral Daily 12/16/16 0737 12/20/16 1046   12/14/16 0600  vancomycin  (VANCOCIN) 1,250 mg in sodium chloride 0.9 % 250 mL IVPB  Status:  Discontinued     1,250 mg 166.7 mL/hr over 90 Minutes Intravenous Every 12 hours 12/13/16 1916 12/17/16 0547   12/10/16 1400  vancomycin (VANCOCIN) IVPB 1000 mg/200 mL premix  Status:  Discontinued     1,000 mg 100 mL/hr over 120 Minutes Intravenous Every 12 hours 12/10/16 1059 12/13/16 1916   12/08/16 1800  ertapenem (INVANZ) 1 g in sodium chloride 0.9 % 50 mL IVPB  Status:  Discontinued     1 g 100 mL/hr over 30 Minutes Intravenous Every 24 hours 12/08/16 1512 12/20/16 1044   12/08/16 1600  vancomycin (VANCOCIN) IVPB 750 mg/150 ml premix  Status:  Discontinued     750 mg 100 mL/hr over 90 Minutes Intravenous Every 12 hours 12/08/16 1513 12/10/16 1059   12/07/16 2300  vancomycin (VANCOCIN) IVPB 750 mg/150 ml premix  Status:  Discontinued     750 mg 150 mL/hr over 60 Minutes Intravenous Every 12 hours 12/07/16 1103 12/08/16 1513  12/07/16 2200  ciprofloxacin (CIPRO) IVPB 400 mg  Status:  Discontinued     400 mg 200 mL/hr over 60 Minutes Intravenous Every 12 hours 12/07/16 1122 12/08/16 1512   12/07/16 1115  vancomycin (VANCOCIN) 2,000 mg in sodium chloride 0.9 % 500 mL IVPB     2,000 mg 250 mL/hr over 120 Minutes Intravenous  Once 12/07/16 1103 12/07/16 1440   12/06/16 2200  ciprofloxacin (CIPRO) IVPB 400 mg  Status:  Discontinued     400 mg 200 mL/hr over 60 Minutes Intravenous Every 12 hours 12/06/16 1409 12/07/16 1039   12/06/16 1400  piperacillin-tazobactam (ZOSYN) IVPB 3.375 g  Status:  Discontinued     3.375 g 12.5 mL/hr over 240 Minutes Intravenous Every 8 hours 12/06/16 0643 12/06/16 0825   12/06/16 1000  ciprofloxacin (CIPRO) IVPB 200 mg  Status:  Discontinued     200 mg 100 mL/hr over 60 Minutes Intravenous Every 12 hours 12/06/16 0825 12/06/16 1409   12/06/16 1000  metroNIDAZOLE (FLAGYL) IVPB 500 mg  Status:  Discontinued     500 mg 100 mL/hr over 60 Minutes Intravenous Every 8 hours 12/06/16 0825 12/08/16  1512   12/04/16 0630  piperacillin-tazobactam (ZOSYN) 3.375 g in dextrose 5 % 50 mL IVPB  Status:  Discontinued     3.375 g 12.5 mL/hr over 240 Minutes Intravenous Every 8 hours 12/04/16 0623 12/06/16 0643   12/03/16 2130  piperacillin-tazobactam (ZOSYN) IVPB 3.375 g  Status:  Discontinued     3.375 g 12.5 mL/hr over 240 Minutes Intravenous Every 8 hours 12/03/16 2125 12/04/16 0623   12/03/16 1915  piperacillin-tazobactam (ZOSYN) IVPB 3.375 g     3.375 g 100 mL/hr over 30 Minutes Intravenous  Once 12/03/16 1907 12/03/16 2005       Objective: Vitals:   12/24/16 0505 12/24/16 0845 12/24/16 0846 12/24/16 0847  BP: (!) 169/66     Pulse: 65     Resp: 18     Temp: 98.1 F (36.7 C)     TempSrc: Oral     SpO2: 96% 96% 96% 96%  Weight: 107.9 kg (237 lb 14 oz)     Height:        Intake/Output Summary (Last 24 hours) at 12/24/16 1052 Last data filed at 12/24/16 1015  Gross per 24 hour  Intake              925 ml  Output             5345 ml  Net            -4420 ml   Filed Weights   12/21/16 0413 12/23/16 0404 12/24/16 0505  Weight: 123.4 kg (272 lb 0.8 oz) 119.1 kg (262 lb 9.1 oz) 107.9 kg (237 lb 14 oz)    Examination: General exam: Appears comfortable  HEENT: PERRLA, oral mucosa moist, no sclera icterus or thrush Respiratory system: Clear to auscultation. Respiratory effort normal. Cardiovascular system: S1 & S2 heard, RRR.  No murmurs  Gastrointestinal system: Abdomen soft, non-tender, nondistended. Normal bowel sound. Drain in RLQ with yellowish discharge Central nervous system: Alert and oriented. No focal neurological deficits. Extremities: No cyanosis, clubbing or edema Skin: RASH: generalized on arms, breasts, abdomen- macular in some areas and confluent in others>> resolved Psychiatry:  Mood & affect appropriate.     Data Reviewed: I have personally reviewed following labs and imaging studies  CBC:  Recent Labs Lab 12/19/16 1006 12/20/16 0500 12/21/16 0400  12/22/16 0500 12/24/16 0354  WBC 10.1 11.9* 12.1* 11.2* 7.5  NEUTROABS  --   --   --   --  5.4  HGB 10.1* 9.8* 9.7* 9.9* 9.9*  HCT 31.6* 30.4* 30.1* 31.2* 29.9*  MCV 96.9 95.9 95.9 96.6 93.1  PLT 229 213 207 197 700   Basic Metabolic Panel:  Recent Labs Lab 12/19/16 1006 12/20/16 0500 12/21/16 0400 12/22/16 0500 12/23/16 0447 12/24/16 0354  NA 140 135 136 136  --  135  K 3.8 4.5 4.2 4.1  --  3.4*  CL 100* 88* 86* 84*  --  80*  CO2 35* 42* 45* 47*  --  47*  GLUCOSE 109* 112* 87 78  --  80  BUN 14 21* 27* 34*  --  36*  CREATININE 0.63 0.92 0.80 0.82 0.86 0.90  CALCIUM 7.4* 8.3* 8.4* 8.4*  --  8.5*  MG  --   --   --   --   --  1.6*   GFR: Estimated Creatinine Clearance: 74.6 mL/min (by C-G formula based on SCr of 0.9 mg/dL). Liver Function Tests:  Recent Labs Lab 12/19/16 1006 12/22/16 0500  AST 13* 24  ALT 11* 23  ALKPHOS 63 59  BILITOT 0.5 0.6  PROT 5.1* 5.5*  ALBUMIN 2.1* 2.4*   No results for input(s): LIPASE, AMYLASE in the last 168 hours. No results for input(s): AMMONIA in the last 168 hours. Coagulation Profile: No results for input(s): INR, PROTIME in the last 168 hours. Cardiac Enzymes: No results for input(s): CKTOTAL, CKMB, CKMBINDEX, TROPONINI in the last 168 hours. BNP (last 3 results) No results for input(s): PROBNP in the last 8760 hours. HbA1C: No results for input(s): HGBA1C in the last 72 hours. CBG:  Recent Labs Lab 12/21/16 0751 12/21/16 0800 12/21/16 1531 12/21/16 1921  GLUCAP 111* 82 103* 152*   Lipid Profile: No results for input(s): CHOL, HDL, LDLCALC, TRIG, CHOLHDL, LDLDIRECT in the last 72 hours. Thyroid Function Tests: No results for input(s): TSH, T4TOTAL, FREET4, T3FREE, THYROIDAB in the last 72 hours. Anemia Panel: No results for input(s): VITAMINB12, FOLATE, FERRITIN, TIBC, IRON, RETICCTPCT in the last 72 hours. Urine analysis:    Component Value Date/Time   COLORURINE YELLOW 12/04/2016 0425   APPEARANCEUR HAZY  (A) 12/04/2016 0425   LABSPEC >1.046 (H) 12/04/2016 0425   PHURINE 5.0 12/04/2016 0425   GLUCOSEU NEGATIVE 12/04/2016 0425   HGBUR NEGATIVE 12/04/2016 0425   BILIRUBINUR NEGATIVE 12/04/2016 0425   KETONESUR NEGATIVE 12/04/2016 0425   PROTEINUR NEGATIVE 12/04/2016 0425   NITRITE NEGATIVE 12/04/2016 0425   LEUKOCYTESUR NEGATIVE 12/04/2016 0425   Sepsis Labs: '@LABRCNTIP'$ (procalcitonin:4,lacticidven:4) ) Recent Results (from the past 240 hour(s))  MRSA PCR Screening     Status: None   Collection Time: 12/19/16 10:06 AM  Result Value Ref Range Status   MRSA by PCR NEGATIVE NEGATIVE Final    Comment:        The GeneXpert MRSA Assay (FDA approved for NASAL specimens only), is one component of a comprehensive MRSA colonization surveillance program. It is not intended to diagnose MRSA infection nor to guide or monitor treatment for MRSA infections.          Radiology Studies: No results found.    Scheduled Meds: . sodium chloride   Intravenous Once  . amLODipine  10 mg Oral Daily  . arformoterol  15 mcg Nebulization Q12H  . budesonide (PULMICORT) nebulizer solution  0.5 mg Nebulization Q12H  . chlorhexidine  15 mL Mouth Rinse BID  . cholecalciferol  1,000 Units Oral Daily  . citalopram  10 mg Oral Daily  . enoxaparin (LOVENOX) injection  40 mg Subcutaneous Q24H  . famotidine  40 mg Oral BID  . gabapentin  100 mg Oral TID  . losartan  100 mg Oral Daily   And  . hydrochlorothiazide  25 mg Oral Daily  . ipratropium-albuterol  3 mL Nebulization TID  . lip balm  1 application Topical BID  . mouth rinse  15 mL Mouth Rinse q12n4p  . metoprolol  150 mg Oral Daily  . pantoprazole  40 mg Oral Q1200  . polyethylene glycol  17 g Oral Daily  . potassium chloride  40 mEq Oral Q4H  . [START ON 12/25/2016] predniSONE  40 mg Oral Q breakfast  . protein supplement shake  11 oz Oral Q24H  . saccharomyces boulardii  250 mg Oral BID  . sodium chloride flush  3 mL Intravenous Q12H    Continuous Infusions:   LOS: 21 days    Time spent in minutes: 62    [place], MD [facility] Pager: [email or web address] Password [facility] 12/24/2016, 10:52 AM

## 2016-12-24 NOTE — Progress Notes (Signed)
Referring Physician(s): Clent Demark  Supervising Physician: Sandi Mariscal  Patient Status:  [facility] - In-pt  Chief Complaint:  Pelvic abscess  Subjective:  Pt sitting in chair; felt some lightheadedness with transfer; denies worsening abd pain,N/V or worsening resp issues  Allergies: Ciprofloxacin; Flagyl [metronidazole]; and Zosyn [piperacillin sod-tazobactam so]  Medications: Prior to Admission medications   Medication Sig Start Date End Date Taking? Authorizing Provider  ADVAIR DISKUS 250-50 MCG/DOSE AEPB Inhale 1 puff into the lungs Twice daily. 03/10/12  Yes Historical Provider, MD  albuterol (PROVENTIL HFA;VENTOLIN HFA) 108 (90 Base) MCG/ACT inhaler Inhale 2 puffs into the lungs every 6 (six) hours as needed for wheezing or shortness of breath.   Yes Historical Provider, MD  ALPRAZolam Duanne Moron) 0.5 MG tablet Take 0.5 mg by mouth 3 (three) times daily as needed for anxiety.  11/21/16  Yes Historical Provider, MD  amLODipine (NORVASC) 10 MG tablet Take 10 mg by mouth daily. 09/11/16  Yes Historical Provider, MD  Cholecalciferol (VITAMIN D PO) Take 4,000-5,000 Units by mouth daily.   Yes Historical Provider, MD  citalopram (CELEXA) 10 MG tablet Take 10 mg by mouth daily. 11/21/16  Yes Historical Provider, MD  losartan-hydrochlorothiazide (HYZAAR) 100-25 MG per tablet Take 1 tablet by mouth daily. 05/04/14  Yes Belva Crome, MD  metoprolol (LOPRESSOR) 100 MG tablet Take 1.5 tablets (150 mg total) by mouth daily. 07/14/13  Yes Belva Crome, MD  amLODipine (NORVASC) 5 MG tablet Take 1 tablet (5 mg total) by mouth daily. Patient not taking: Reported on 12/03/2016 07/14/13   Belva Crome, MD     Vital Signs: BP (!) 169/66 (BP Location: Left Arm)   Pulse 65   Temp 98.1 F (36.7 C) (Oral)   Resp 18   Ht '5\' 7"'$  (1.702 m)   Wt 237 lb 14 oz (107.9 kg)   SpO2 96%   BMI 37.26 kg/m   Physical Exam RLQ drain intact, insertion site ok, output 20 cc feculent material; drain irrigated with  return few cc's feculent material/air  Imaging: Ct Abdomen Pelvis W Contrast  Result Date: 12/21/2016 CLINICAL DATA:  History of ruptured appendicitis, post percutaneous periappendiceal drainage catheter placement on 12/04/2016 and left trans gluteal approach percutaneous drainage catheter placement on 12/14/2016. EXAM: CT ABDOMEN AND PELVIS WITH CONTRAST TECHNIQUE: Multidetector CT imaging of the abdomen and pelvis was performed using the standard protocol following bolus administration of intravenous contrast. CONTRAST:  100 ISOVUE-300 IOPAMIDOL (ISOVUE-300) INJECTION 61% COMPARISON:  CT-guided periappendiceal abscess drainage catheter placement- 12/04/2016; left fibula approach percutaneous drainage catheter placement - 12/14/2016; CT abdomen pelvis -12/13/2016; 12/03/2016 ; chest CT- 11/14/2016 FINDINGS: Lower chest: Limited visualization of the lower thorax demonstrates chronic atelectasis / collapse of the right lower lobe. Interval increase in size of small left-sided effusion with complete atelectasis of the imaged portion of the left lower lobe. Interval increase in small right-sided pleural effusion. Cardiomegaly. Coronary artery calcifications. No pericardial effusion. Hepatobiliary: Mild nodularity hepatic contour. No discrete hepatic lesions. Normal appearance of the gallbladder given degree distention. No radiopaque gallstones. No intra extrahepatic bili duct dilatation. No ascites. Pancreas: Normal appearance of the pancreas Spleen: Normal appearance of the spleen. Note is made of a small splenule. Adrenals/Urinary Tract: There is symmetric enhancement and excretion of the bilateral kidneys. The left kidney appears mildly hypertrophied in comparison to the right. No definite renal stones this postcontrast examination. No discrete renal lesions. There is a minimal amount of likely age and body habitus related perinephric stranding. No  urinary obstruction. Normal appearance the bilateral adrenal  glands. Normal appearance of the urinary bladder given underdistention with a focal catheter. Stomach/Bowel: Partial retraction of Terry appendiceal abscess drainage catheter with radiopaque site marker now projecting external to the right abdominal musculature (image 67, series 2). The dominant component of the pigtail does remain within a residual 3.8 x 1.9 cm periappendiceal fluid collection (coronal image 54, series 5). Interval retraction of left trans gluteal approach percutaneous drainage catheter with radiopaque site marker now within in the central aspect of the left pelvic sidewall musculature (image 75, series 2). The tip of the pigtail drain does remain appropriately positioned within the post a vesicular space. There has been complete resolution of previously noted pelvic collection. A small amount of fluid is seen within the pelvic cul-de-sac without peripheral wall enhancement. Large colonic stool burden without evidence of enteric obstruction. The colon is again noted to be interposed about the liver edge normal appearance of the terminal ileum. No pneumoperitoneum, pneumatosis or portal venous gas. No new definable/ drainable fluid collections within the abdomen or pelvis. Vascular/Lymphatic: Large amount of irregular mixed calcified and noncalcified atherosclerotic plaque throughout the abdominal aorta. Suspected severe (at least 75%) luminal narrowing involving the right common iliac artery (axial image 58, series 2, coronal image 77, series 5), incompletely evaluated on this non CTA examination. The major branch vessels of the abdominal aorta appear patent on this non CTA examination. Scattered retroperitoneal, pelvic and inguinal lymph nodes are numerous though individually not enlarged by size criteria in presumed reactive due to patient body habitus. Reproductive: Normal appearance of the pelvic organs. No discrete adnexal lesion. Other: Large amount of body wall edema. Musculoskeletal: No  acute or aggressive osseous abnormalities. Moderate DDD of L3-L4 with disc space height loss, endplate irregularity and sclerosis. IMPRESSION: 1. Slight retraction of peri-appendiceal drainage catheter however tip remains within residual approximately 3.8 cm residual air and fluid collection. 2. Interval resolution of pelvic fluid collection following left-sided trans gluteal percutaneous drainage catheter placement. Note, the left-sided percutaneous drainage catheter has also been slightly retracted though tip remains well positioned within the midline of the lower pelvis. 3. No new definable/drainable abdominal or pelvic fluid collections. 4. Large colonic stool burden without evidence of enteric obstruction. 5. Similar appearance of known right lower lobe mass as previously characterized on dedicated chest CT performed 11/14/2016. 6. Increased bilateral pleural effusions with worsening left basilar atelectasis, though note, underlying infection is not excluded. 7. Cardiomegaly with increased size of bilateral pleural effusions and worsening diffuse body wall edema, constellation of findings worrisome for CHF. 8. Nodularity hepatic contour suggestive of early cirrhotic change. Correlation LFTs is recommended. 9. Aortic Atherosclerosis (ICD10-170.0) Suspected hemodynamically significant stenosis involving the right common iliac artery, incompletely evaluated on this non CTA examination. Correlation for right lower extremity PAD symptoms is recommended. Electronically Signed   By: Sandi Mariscal M.D.   On: 12/21/2016 12:44   Ir Sinus/fist Tube Chk-non Gi  Result Date: 12/21/2016 INDICATION: 71 year old female with a history of ruptured appendicitis and percutaneous drainage. CT drain placed 12/04/2016 EXAM: FLUOROSCOPIC DRAIN INJECTION MEDICATIONS: The patient is currently admitted to the hospital and receiving intravenous antibiotics. The antibiotics were administered within an appropriate time frame prior to the  initiation of the procedure. ANESTHESIA/SEDATION: None COMPLICATIONS: None PROCEDURE: Informed written consent was obtained from the patient after a thorough discussion of the procedural risks, benefits and alternatives. All questions were addressed. Sterile technique was utilized including caps, mask, sterile gowns, sterile gloves, sterile  drape, hand hygiene and skin antiseptic. A timeout was performed prior to the initiation of the procedure. Patient positioned supine position on the fluoroscopy table. Scout images of the right lower abdomen performed. Contrast was injected through the indwelling drain with images stored. Drain was then flushed and attached to gravity drainage. Patient tolerated procedure well and remained hemodynamically stable throughout. No complications were encountered and no significant blood loss. FINDINGS: Initial image demonstrates drain within the right lower quadrant. Injection of the drain demonstrates immediate filling of the tubular structure which is connected to the cecum. Small amount of contrast decompressed through the skin site along the tract of the drain. No abscess cavity identified. IMPRESSION: Status post drain injection of right lower quadrant in the region of prior ruptured appendicitis, confirming fistulous connection of the drainage catheter with the appendix. It appears as though the ruptured appendix has closed around the drain, with the current drain acting as a cecostomy tube. These results were called by telephone at the time of interpretation on 12/21/2016 at 2:34 pm to Dr. Earnstine Regal , who verbally acknowledged these results. Signed, Dulcy Fanny. Earleen Newport, DO [facility] Radiology Electronically Signed   By: Corrie Mckusick D.O.   On: 12/21/2016 14:35   Ir US Guide Bx Asp/drain  Result Date: 12/21/2016 INDICATION: 71 year old female with axillary lymphadenopathy. EXAM: IR ULTRASOUND GUIDANCE MEDICATIONS: None.  ANESTHESIA/SEDATION: None FLUOROSCOPY TIME:  None COMPLICATIONS: None PROCEDURE: Informed written consent was obtained from the patient after a thorough discussion of the procedural risks, benefits and alternatives. All questions were addressed. Maximal Sterile Barrier Technique was utilized including caps, mask, sterile gowns, sterile gloves, sterile drape, hand hygiene and skin antiseptic. A timeout was performed prior to the initiation of the procedure. Patient positioned supine position on the fluoroscopic table. Ultrasound images of the right axillary region were performed with images stored and sent to PACs. The patient was then prepped and draped in the usual sterile fashion. The skin and subcutaneous tissues were generously infiltrated 1% lidocaine for local anesthesia. Multiple core biopsy were achieved using 18 gauge device. Images were stored. Tissue samples placed in the saline. Patient tolerated the procedure well and remained hemodynamically stable throughout. No complications were encountered and no significant blood loss. IMPRESSION: Status post ultrasound-guided biopsy of right axillary node. Tissue specimen sent to pathology for complete histopathologic analysis. Signed, Dulcy Fanny. Earleen Newport, DO [facility] Radiology Electronically Signed   By: Corrie Mckusick D.O.   On: 12/21/2016 15:14    Labs:  CBC:  Recent Labs  12/20/16 0500 12/21/16 0400 12/22/16 0500 12/24/16 0354  WBC 11.9* 12.1* 11.2* 7.5  HGB 9.8* 9.7* 9.9* 9.9*  HCT 30.4* 30.1* 31.2* 29.9*  PLT 213 207 197 203    COAGS:  Recent Labs  12/04/16 1005  INR 1.15    BMP:  Recent Labs  12/20/16 0500 12/21/16 0400 12/22/16 0500 12/23/16 0447 12/24/16 0354  NA 135 136 136  --  135  K 4.5 4.2 4.1  --  3.4*  CL 88* 86* 84*  --  80*  CO2 42* 45* 47*  --  47*  GLUCOSE 112* 87 78  --  80  BUN 21* 27* 34*  --  36*  CALCIUM 8.3* 8.4* 8.4*  --  8.5*  CREATININE 0.92 0.80  0.82 0.86 0.90  GFRNONAA >60 >60 >60 >60 >60  GFRAA >60 >60 >60 >60 >60    LIVER FUNCTION TESTS:  Recent Labs  12/03/16 1613 12/15/16 0452 12/19/16 1006 12/22/16 0500  BILITOT 0.7 0.8 0.5 0.6  AST 19 10* 13* 24  ALT 13* 9* 11* 23  ALKPHOS 98 57 63 59  PROT 6.7 4.8* 5.1* 5.5*  ALBUMIN 2.8* 1.9* 2.1* 2.4*    Assessment and Plan: Perforated appendicitis with multiple intra-abdominal abscesses, s/p RLQ drain placed 12/04/16, left transgluteal pelvic drain placed 12/14/16; 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   Electronically Signed: D. Rowe Robert 12/24/2016, 10:07 AM   I spent a total of 15 minutes at the the patient's bedside AND on the patient's hospital floor or unit, greater than 50% of which was counseling/coordinating care for pelvic abscess drain    Patient ID: Blanche East, female   DOB: 07/28/1946, 71 y.o.   MRN: [id number]

## 2016-12-24 NOTE — Care Management Important Message (Signed)
Important Message  Patient Details  Name: Alisha Fisher MRN: [id number] Date of Birth: [birth year]   Medicare Important Message Given:  Yes    Kerin Salen 12/24/2016, 11:50 AMImportant Message  Patient Details  Name: Alisha Fisher MRN: [id number] Date of Birth: 17-Sep-1946   Medicare Important Message Given:  Yes    Kerin Salen 12/24/2016, 11:50 AM

## 2016-12-25 ENCOUNTER — Ambulatory Visit
Admit: 2016-12-25 | Discharge: 2016-12-25 | Disposition: A | Discharge status: Home / Self Care | Payer: Medicare Other | Attending: Radiation Oncology | Admitting: Radiation Oncology

## 2016-12-25 DIAGNOSIS — J9601 Acute respiratory failure with hypoxia: Secondary | ICD-10-CM | POA: Diagnosis not present

## 2016-12-25 DIAGNOSIS — G4730 Sleep apnea, unspecified: Secondary | ICD-10-CM

## 2016-12-25 DIAGNOSIS — C3431 Malignant neoplasm of lower lobe, right bronchus or lung: Secondary | ICD-10-CM | POA: Diagnosis present

## 2016-12-25 DIAGNOSIS — D649 Anemia, unspecified: Secondary | ICD-10-CM

## 2016-12-25 LAB — IRON AND TIBC
IRON: 105 ug/dL (ref 28–170)
Saturation Ratios: 40 % — ABNORMAL HIGH (ref 10.4–31.8)
TIBC: 262 ug/dL (ref 250–450)
UIBC: 157 ug/dL

## 2016-12-25 LAB — CREATININE, SERUM
CREATININE: 0.9 mg/dL (ref 0.44–1.00)
GFR calc Af Amer: >60 mL/min (ref >60)

## 2016-12-25 LAB — FERRITIN: Ferritin: 426 ng/mL — ABNORMAL HIGH (ref 11–307)

## 2016-12-25 MED ORDER — FAMOTIDINE 40 MG PO TABS: 40.0000 mg | ORAL_TABLET | Freq: Two times a day (BID) | ORAL | Status: DC

## 2016-12-25 MED ORDER — FUROSEMIDE 40 MG PO TABS
40.0000 mg | ORAL_TABLET | Freq: Two times a day (BID) | ORAL | 0 refills | Status: DC
Start: 2016-12-25 — End: 2017-03-20

## 2016-12-25 MED ORDER — LOSARTAN POTASSIUM 100 MG PO TABS: 100.0000 mg | ORAL_TABLET | Freq: Every day | ORAL | Status: DC

## 2016-12-25 MED ORDER — PREDNISONE 20 MG PO TABS
20.0000 mg | ORAL_TABLET | Freq: Every day | ORAL | Status: DC
  Administered 2016-12-26: 20 mg via ORAL
  Filled 2016-12-25: qty 1

## 2016-12-25 MED ORDER — HYDRALAZINE HCL 25 MG PO TABS
25.0000 mg | ORAL_TABLET | Freq: Three times a day (TID) | ORAL | Status: DC
  Administered 2016-12-25 – 2016-12-26 (×4): 25 mg via ORAL
  Filled 2016-12-25 (×4): qty 1

## 2016-12-25 MED ORDER — GABAPENTIN 100 MG PO CAPS: 100.0000 mg | ORAL_CAPSULE | Freq: Three times a day (TID) | ORAL | Status: DC

## 2016-12-25 MED ORDER — POTASSIUM CHLORIDE CRYS ER 20 MEQ PO TBCR: 40.0000 meq | EXTENDED_RELEASE_TABLET | Freq: Two times a day (BID) | ORAL | Status: DC

## 2016-12-25 MED ORDER — POLYETHYLENE GLYCOL 3350 17 G PO PACK: 17.0000 g | PACK | Freq: Every day | ORAL | 0 refills | Status: DC

## 2016-12-25 MED ORDER — ACETAMINOPHEN 325 MG PO TABS: 650.0000 mg | ORAL_TABLET | Freq: Four times a day (QID) | ORAL | Status: DC | PRN

## 2016-12-25 MED ORDER — METOPROLOL TARTRATE 75 MG PO TABS: 150.0000 mg | ORAL_TABLET | Freq: Two times a day (BID) | ORAL | Status: DC

## 2016-12-25 MED ORDER — HYDRALAZINE HCL 25 MG PO TABS: 25.0000 mg | ORAL_TABLET | Freq: Three times a day (TID) | ORAL | Status: DC

## 2016-12-25 MED ORDER — POTASSIUM CHLORIDE CRYS ER 20 MEQ PO TBCR
40.0000 meq | EXTENDED_RELEASE_TABLET | Freq: Two times a day (BID) | ORAL | Status: AC
  Administered 2016-12-25 (×2): 40 meq via ORAL
  Filled 2016-12-25 (×2): qty 2

## 2016-12-25 MED ORDER — PREDNISONE 20 MG PO TABS: 20.0000 mg | ORAL_TABLET | Freq: Every day | ORAL | Status: DC

## 2016-12-25 MED ORDER — ARFORMOTEROL TARTRATE 15 MCG/2ML IN NEBU: 15.0000 ug | INHALATION_SOLUTION | Freq: Two times a day (BID) | RESPIRATORY_TRACT | Status: DC

## 2016-12-25 MED ORDER — METOPROLOL TARTRATE 50 MG PO TABS
150.0000 mg | ORAL_TABLET | Freq: Two times a day (BID) | ORAL | Status: DC
  Administered 2016-12-25 – 2016-12-26 (×2): 150 mg via ORAL
  Filled 2016-12-25 (×2): qty 3

## 2016-12-25 MED ORDER — FUROSEMIDE 40 MG PO TABS
40.0000 mg | ORAL_TABLET | Freq: Two times a day (BID) | ORAL | Status: DC
  Administered 2016-12-25: 40 mg via ORAL
  Filled 2016-12-25: qty 1

## 2016-12-25 MED ORDER — BUDESONIDE 0.5 MG/2ML IN SUSP: 0.5000 mg | Freq: Two times a day (BID) | RESPIRATORY_TRACT | 12 refills | Status: DC

## 2016-12-25 MED ORDER — ONDANSETRON 4 MG PO TBDP: 4.0000 mg | ORAL_TABLET | Freq: Four times a day (QID) | ORAL | 0 refills | Status: DC | PRN

## 2016-12-25 NOTE — Progress Notes (Signed)
qPhysical Therapy Treatment Patient Details Name: Alisha Fisher MRN: [id number] DOB: 1945/11/03 Today's Date: 12/25/2016    History of Present Illness Alisha Fisher is a 71 y.o. female ,former smoker, with history of hypertension, COPD, who presented to ED 12/03/16 with 3 day history of right lower quadrant abdominal pain. Subsequent imaging revealed. Perforated appendicitis. 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    PT Comments    Pt OOB in recliner on 2 lts with sats at 95%.  Assisted with amb to and from bathroom per pt request.  Remained on 2 lts sats decreased to 83%.  Pt required extended rest break and VC's on purse lip breathing.  Noted 3/4 DOE and very limited activity tolerance.   Follow Up Recommendations  SNF;Supervision/Assistance - 24 hour     Equipment Recommendations  None recommended by PT (has a rollator)    Recommendations for Other Services       Precautions / Restrictions Precautions Precautions: Fall Precaution Comments: R LQ drain, home O2, incont B/B Restrictions Weight Bearing Restrictions: No    Mobility  Bed Mobility               General bed mobility comments: OOB in recliner  Transfers Overall transfer level: Needs assistance Equipment used: Rolling walker (2 wheeled);None Transfers: Sit to/from Stand Sit to Stand: Min assist Stand pivot transfers: Min assist       General transfer comment: 50% VC's on proper hand placement to push up vs pull up on walker.  25% VC's safety with turns and negociating tight spaces(bathroom)   Ambulation/Gait Ambulation/Gait assistance: Min guard Ambulation Distance (Feet): 24 Feet (12 feet x 2 to and from bathroom only) Assistive device: Rolling walker (2 wheeled) Gait Pattern/deviations: Step-to pattern;Step-through pattern Gait velocity: decreased   General Gait Details: limited activity tolerance and noted 3/4 DOE.  O2 sats decreased from 94%  at rest on 2 lts 83%   Stairs            Wheelchair Mobility    Modified Rankin (Stroke Patients Only)       Balance                                            Cognition Arousal/Alertness: Awake/alert Behavior During Therapy: Anxious Overall Cognitive Status: Within Functional Limits for tasks assessed                                 General Comments: some expressive aphagia/word finding issues.   Break ups in mid sentence.         Exercises      General Comments        Pertinent Vitals/Pain Pain Assessment: No/denies pain    Home Living                      Prior Function            PT Goals (current goals can now be found in the care plan section) Progress towards PT goals: Progressing toward goals    Frequency    Min 3X/week      PT Plan Current plan remains appropriate    Co-evaluation             End of  Session Equipment Utilized During Treatment: Oxygen Activity Tolerance: Patient limited by fatigue;Other (comment) (Dyspnea) Patient left: in chair;with call bell/phone within reach;with chair alarm set Nurse Communication: Mobility status PT Visit Diagnosis: Pain;Unsteadiness on feet (R26.81)     Time: 5625-6389 PT Time Calculation (min) (ACUTE ONLY): 25 min  Charges:  $Gait Training: 8-22 mins $Therapeutic Activity: 8-22 mins                    G Codes:       Rica Koyanagi  PTA [facility]  Acute  Rehab Pager      [phone]

## 2016-12-25 NOTE — Progress Notes (Signed)
[place] [facility] Therapy Treatment Record Phone [phone]   [id number] Therapy was administered to [facility] on: 12/25/2016  9:48 AM and was treatment # 3 out of a planned course of 10 treatments.  Radiation Treatment  1). Beam photons with 6-10 energy  2). Brachytherapy None  3). Stereotactic Radiosurgery None  4). Other Radiation None     Bennett Scrape, RT (T)

## 2016-12-25 NOTE — Progress Notes (Signed)
Nutrition Follow-up  DOCUMENTATION CODES:   Obesity unspecified  INTERVENTION:  - Will d/c Premier Protein.  - Continue to encourage PO intakes of meals and beverages. - RD will continue to monitor for additional needs.  NUTRITION DIAGNOSIS:   Increased nutrient needs related to catabolic illness, cancer and cancer related treatments as evidenced by estimated needs. -updated  GOAL:   Patient will meet greater than or equal to 90% of their needs -met on average.   MONITOR:   PO intake, Weight trends, Labs  ASSESSMENT:   71 y.o. female presented to the ED on 12/03/16 with c/o RLQ pain.  Abd CT showed perforated appendicitis with contained perforation/abscess -- s/p IR drain on 12/04/16.  Zosyn was started for broad coverage.  Patient reported rash and itching on 12/05/16 and this was suspected to be related to zosyn.  Zosyn d/ced on 12/06/16 and changed to cipro and flagyl.  On 12/07/16, pt's rash appeared to be getting worse after cipro started.  Abscess culture from 3/6/18now back with enterococcus faecalis.  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  3/27 Per review, pt has been eating 100% of meals the past 2 days and today. Yesterday she consumed ~2565 kcal and 65 grams of protein. For breakfast this AM she consumed ~860 kcal and 40 grams of protein. She has refused Premier Protein the past 3 days. Surgery PA note from today states that pt is okay for d/c from Surgery standpoint. Pt has been undergoing radiation and Dr. Antonieta Pert note from this AM states that pt may need systemic chemo in the future. Weight now trending back down and is currently +5.9 kg from admission weight.  Medications reviewed; 1000 units vitamin D/day, 40 mg oral Pepcid BID, 40 mg oral Lasix BID, 17 g Miralax/day, 40 mEq oral KCl x2 doses yesterday and today, 20 mg Deltasone/day, 250 mg Florastor BID. Labs reviewed; K: 3.4 mmol/L, Cl: 80 mmol/L, BUN: 36 mg/dL, Ca: 8.5 mg/dL, Mg: 1.6  mg/dL.    3/22 - Pt received TPN via PICC x1 day and then was on oral diet only.  - On 3/19 she ate 90% of dinner on Soft diet.  - Per chart review, weight +21.2 kg since admission which is likely fluid related.  - Estimated nutrition needs updated this AM with IBW used d/t weight increase.  - Needs based on notes indicating dx during this admission of squamous cell carcinoma of the lung with Dr. Marin Olp now following.  - Notes also indicate pt with peritonitis.  - Pt reports that for breakfast she had ~50% of omelet, a few bites of hash browns, 50% of banana, ginger ale, V8 juice, and 100% of strawberry yogurt.  - Pt reports some mild intermittent nausea and abdominal pain the past few days but denies any vomiting.  - She states that her appetite is somewhat variable and sometimes she feels hungry before receiving meal trays and not hungry once they arrive.  - She indicates that she has been refusing Miralax as she does not want to have to go to the bathroom and be unable to make it there in time; this may be impacting PO intakes.  - Per Radiology PA note yesterday, recommendation for CT abd/pelvis 3/23 with drain injections to r/o fistula to bowel. Note states RLQ drain placed 3/6 and L transgluteal pelvic drain placed 3/16.   3/16 - Continues to complain of abdominal pain and bloating.  - Continues to have poor oral intake and has not  had adequate oral intake for at least 10 days.  - RD spoke to [facility] about initiating TPN for this patient as pt can not tolerate oral diet at this time.  - Pt with PICC line in place already.  - Pt does not like any supplements.  - No new weight on this pt since 3/5.  - RD asked RN and nurse tech to obtain weight on this pt today.  - Plan is for pt to have drain placed in IR likely tomorrow.  - Monitor for refeeding; pt at high risk.     Diet Order:  DIET SOFT Room service appropriate? Yes; Fluid consistency: Thin  Skin:  Wound (see comment)  (abdominal drains)  Last BM:  3/26  Height:   Ht Readings from Last 1 Encounters:  12/18/16 5' 7" (1.702 m)    Weight:   Wt Readings from Last 1 Encounters:  12/25/16 238 lb 1.6 oz (108 kg)    Ideal Body Weight:  61.3 kg  BMI:  Body mass index is 37.29 kg/m.  Estimated Nutritional Needs:   Kcal:  2025-2210 (33-36 kcal/kg IBW)  Protein:  120-135 (2-2.2 grams/kg IBW)  Fluid:  >1.8L  EDUCATION NEEDS:   No education needs identified at this time    Alisha Matin, MS, RD, LDN, CNSC Inpatient Clinical Dietitian Pager # [phone] After hours/weekend pager # [phone]

## 2016-12-25 NOTE — Progress Notes (Signed)
Patient ID: Alisha Fisher, female   DOB: Jan 15, 1946, 71 y.o.   MRN: [id number]  [facility] Surgery Progress Note  8 Days Post-Op  Subjective: Sitting up in chair. States that the edema is a little worse in her lower legs today, she was started back on oral lasix. She has started radiation. Lymph node biopsy pending.  Objective: Vital signs in last 24 hours: Temp:  [98 F (36.7 C)-98.7 F (37.1 C)] 98.7 F (37.1 C) (03/27 0459) Pulse Rate:  [70-76] 76 (03/27 0459) Resp:  [18] 18 (03/27 0459) BP: (147-170)/(60-65) 165/65 (03/27 0459) SpO2:  [95 %-98 %] 95 % (03/27 0800) Weight:  [238 lb 1.6 oz (108 kg)] 238 lb 1.6 oz (108 kg) (03/27 0459) Last BM Date: 12/24/16  Intake/Output from previous day: 03/26 0701 - 03/27 0700 In: 1095 [P.O.:1080; I.V.:10] Out: 1720 [Urine:1700; Drains:20] Intake/Output this shift: Total I/O In: 360 [P.O.:360] Out: -   PE: Gen:  Alert, NAD, pleasant Card:  RRR, no M/G/R heard Pulm:  CTAB, no W/R/R, effort normal, on 2L O2 Abd: Soft, NT/ND, +BS, no HSM, RLQ drain with feculent drainage Ext:  2+ pitting edema BLE, calves nontender  Lab Results:   Recent Labs  12/24/16 0354  WBC 7.5  HGB 9.9*  HCT 29.9*  PLT 203   BMET  Recent Labs  12/24/16 0354 12/25/16 0420  NA 135  --   K 3.4*  --   CL 80*  --   CO2 47*  --   GLUCOSE 80  --   BUN 36*  --   CREATININE 0.90 0.90  CALCIUM 8.5*  --    PT/INR No results for input(s): LABPROT, INR in the last 72 hours. CMP     Component Value Date/Time   NA 135 12/24/2016 0354   K 3.4 (L) 12/24/2016 0354   CL 80 (L) 12/24/2016 0354   CO2 47 (H) 12/24/2016 0354   GLUCOSE 80 12/24/2016 0354   BUN 36 (H) 12/24/2016 0354   CREATININE 0.90 12/25/2016 0420   CALCIUM 8.5 (L) 12/24/2016 0354   PROT 5.5 (L) 12/22/2016 0500   ALBUMIN 2.4 (L) 12/22/2016 0500   AST 24 12/22/2016 0500   ALT 23 12/22/2016 0500   ALKPHOS 59 12/22/2016 0500   BILITOT 0.6 12/22/2016 0500   GFRNONAA >60 12/25/2016  0420   GFRAA >60 12/25/2016 0420   Lipase     Component Value Date/Time   LIPASE 15 12/03/2016 1613       Studies/Results: Mr Brain W Wo Contrast  Result Date: 12/25/2016 CLINICAL DATA:  71 y/o F; lung cancer, evaluate for intracranial metastatic disease. EXAM: MRI HEAD WITHOUT AND WITH CONTRAST TECHNIQUE: Multiplanar, multiecho pulse sequences of the brain and surrounding structures were obtained without and with intravenous contrast. CONTRAST:  21m MULTIHANCE GADOBENATE DIMEGLUMINE 529 MG/ML IV SOLN COMPARISON:  None. FINDINGS: Moderate to severe motion artifact of all sequences due to patient confusion. Brain: No acute infarction, hemorrhage, hydrocephalus, extra-axial collection or mass lesion. No abnormal enhancement identified. Foci of T2 FLAIR hyperintense signal abnormality in subcortical and periventricular white matter are nonspecific but compatible with mild chronic microvascular ischemic changes. Mild brain parenchymal volume loss. Vascular: Normal flow voids. Skull and upper cervical spine: Normal marrow signal. Sinuses/Orbits: Small left maxillary sinus mucous retention cyst. Left mastoid air cell effusion. Bilateral intra-ocular lens replacement. Other: None. IMPRESSION: 1. Moderate to severe motion artifact. If clinically indicated, short interval follow-up when patient is better able to tolerate examination is recommended. 2. No  definite evidence of intracranial metastatic disease. 3. Mild chronic microvascular ischemic changes and mild parenchymal volume loss of the brain. Electronically Signed   By: Kristine Garbe M.D.   On: 12/25/2016 00:37    Anti-infectives: Anti-infectives    Start     Dose/Rate Route Frequency Ordered Stop   12/17/16 1200  vancomycin (VANCOCIN) IVPB 1000 mg/200 mL premix  Status:  Discontinued     1,000 mg 200 mL/hr over 60 Minutes Intravenous Every 12 hours 12/17/16 0634 12/20/16 1044   12/16/16 1000  fluconazole (DIFLUCAN) tablet 200 mg   Status:  Discontinued     200 mg Oral Daily 12/16/16 0737 12/20/16 1046   12/14/16 0600  vancomycin (VANCOCIN) 1,250 mg in sodium chloride 0.9 % 250 mL IVPB  Status:  Discontinued     1,250 mg 166.7 mL/hr over 90 Minutes Intravenous Every 12 hours 12/13/16 1916 12/17/16 0547   12/10/16 1400  vancomycin (VANCOCIN) IVPB 1000 mg/200 mL premix  Status:  Discontinued     1,000 mg 100 mL/hr over 120 Minutes Intravenous Every 12 hours 12/10/16 1059 12/13/16 1916   12/08/16 1800  ertapenem (INVANZ) 1 g in sodium chloride 0.9 % 50 mL IVPB  Status:  Discontinued     1 g 100 mL/hr over 30 Minutes Intravenous Every 24 hours 12/08/16 1512 12/20/16 1044   12/08/16 1600  vancomycin (VANCOCIN) IVPB 750 mg/150 ml premix  Status:  Discontinued     750 mg 100 mL/hr over 90 Minutes Intravenous Every 12 hours 12/08/16 1513 12/10/16 1059   12/07/16 2300  vancomycin (VANCOCIN) IVPB 750 mg/150 ml premix  Status:  Discontinued     750 mg 150 mL/hr over 60 Minutes Intravenous Every 12 hours 12/07/16 1103 12/08/16 1513   12/07/16 2200  ciprofloxacin (CIPRO) IVPB 400 mg  Status:  Discontinued     400 mg 200 mL/hr over 60 Minutes Intravenous Every 12 hours 12/07/16 1122 12/08/16 1512   12/07/16 1115  vancomycin (VANCOCIN) 2,000 mg in sodium chloride 0.9 % 500 mL IVPB     2,000 mg 250 mL/hr over 120 Minutes Intravenous  Once 12/07/16 1103 12/07/16 1440   12/06/16 2200  ciprofloxacin (CIPRO) IVPB 400 mg  Status:  Discontinued     400 mg 200 mL/hr over 60 Minutes Intravenous Every 12 hours 12/06/16 1409 12/07/16 1039   12/06/16 1400  piperacillin-tazobactam (ZOSYN) IVPB 3.375 g  Status:  Discontinued     3.375 g 12.5 mL/hr over 240 Minutes Intravenous Every 8 hours 12/06/16 0643 12/06/16 0825   12/06/16 1000  ciprofloxacin (CIPRO) IVPB 200 mg  Status:  Discontinued     200 mg 100 mL/hr over 60 Minutes Intravenous Every 12 hours 12/06/16 0825 12/06/16 1409   12/06/16 1000  metroNIDAZOLE (FLAGYL) IVPB 500 mg  Status:   Discontinued     500 mg 100 mL/hr over 60 Minutes Intravenous Every 8 hours 12/06/16 0825 12/08/16 1512   12/04/16 0630  piperacillin-tazobactam (ZOSYN) 3.375 g in dextrose 5 % 50 mL IVPB  Status:  Discontinued     3.375 g 12.5 mL/hr over 240 Minutes Intravenous Every 8 hours 12/04/16 0623 12/06/16 0643   12/03/16 2130  piperacillin-tazobactam (ZOSYN) IVPB 3.375 g  Status:  Discontinued     3.375 g 12.5 mL/hr over 240 Minutes Intravenous Every 8 hours 12/03/16 2125 12/04/16 0623   12/03/16 1915  piperacillin-tazobactam (ZOSYN) IVPB 3.375 g     3.375 g 100 mL/hr over 30 Minutes Intravenous  Once 12/03/16 1907 12/03/16 2005  Assessment/Plan Perforated appendicitis - s/p CT guided pelvic drain placement 12/04/16/ CT scan 12/21/16 drain in cecum =>> cecostomy tube - s/p CT scan and IR drain placed 12/14/16, left trans gluteal approach >> removed 3/23  Anasarca/ very positive fluid balance  - fluid overload- lasix 40 mg q12h x7 days. 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 Nestor - BRONCHIAL BRUSHING, RIGHT MAINSTEM(SPECIMEN 4 OF 4 COLLECTED 12/17/16): SQUAMOUS CELL CARCINOMA. -Radiation Rx: started -IR axillary node bx 3/23:  Pending -Brain MRI for staging complete  Rash - Concerns with CIPRO/Flagyl and Zosyn allergies Malnutrition - Prealbumin 14.7 (3/21) HTN Tobacco abuse - quit 11/2016 Anemia of chronic disease ?Cirrhosis Morbid obesity Depression Chronic back  pain  CZY:SAYT diet ID:day 16antibiotics Zosyn 12/03/16-12/05/16; Cipro/flagyl 3/8-3/10/18; Both stopped for rash/possible allergy  - rash is improving Invanz 3/10 =>>day 12Vancomycin 3/09=>>day14Diflucan 3/18 =>>day 5 She did not get Invanz or diflucan 1 day during this course.  VTE: Lovenox   Plan:  Radiation Rx started. Awaiting results of lymph node biopsy.  To SNF once cleared by medicine and oncology. She has bed offers to SNF. From surgical standpoint she is ready for d/c. Will discuss with other teams to determine d/c status.   LOS: 22 days    Jerrye Beavers , [facility] Surgery 12/25/2016, 10:40 AM Pager: [phone] Consults: [phone] Mon-Fri 7:00 am-4:30 pm Sat-Sun 7:00 am-11:30 am

## 2016-12-25 NOTE — Progress Notes (Signed)
CONSULT NOTE    Alisha Fisher   WUJ:[id number]  DOB: 1946/09/14  DOA: 12/03/2016 PCP: Pcp Not In System   Brief Narrative:  This is a 71 year old female with history of hypertension, COPD on chronic home oxygen 2 L nasal cannula, obesity, tobacco abuse, HTN who was admitted on Feb 14 at an outside hospital for pneumonia and was found to have a R lung mass and evaluated by pulmonary. The plan was for the patient to undergo bronchoscopy on 3/7.   She was admitted to [facility] on the surgical service on 12/03/2016 with RLQ abdominal pain, found to have a ruptured appendix and underwent CT guided drainage and perc drain placement by IR on 12/05/15.  CT repeated on 3/15 and left pelvic drain placed on 3/16.  Culture from the intra-abdominal abscess  showed enterococcus faecalis which was sensitive to ampicillin, and she completed a course of antibiotics as below.    Pulmonology was consulted for R lung mass, and patient underwent a bronchoscopy on 3/19 with endobronchial FNA, with pathology showing new diagnosis of squamous cell carcinoma.  Medical oncology and radiation oncology consulted as well.  She had hypoxic and hypercarbic respiratory failure requiring BiPAP use and her hospital course was complicated by significant fluid overload for which she is on IV diuretics.   Interventional radiology was also consulted to evaluate patient for biopsy of her lymph nodes to evaluate for metastatic disease. She underwent an right axillary LN biopsty on 3/23.    Subjective: No complaints. Left thigh pain resolved- has not needed ice pack like yesterday. Sat up in a chair all day yesterday. Legs look swollen. No cough or dyspnea. No nausea of vomiting.   Assessment & Plan:   Principal Problem:   Acute appendicitis with perforation and peritoneal abscess Active Problems:   Obesity   Smoker   COPD (chronic obstructive pulmonary disease) (HCC)   Intraperitoneal abscess s/p perc drainage 12/14/2016  Hypokalemia   Hypomagnesemia   Endobronchial mass   Acute on chronic respiratory failure with hypoxia and hypercapnia (HCC)   Essential hypertension   Anemia   Rash and nonspecific skin eruption   Acute pain of left thigh  Acute on chronic hypercarbic and hypoxic respiratory failure -Multifactorial due to COPD, acute on chronic diastolic CHF/ fluid resusitation, - Pulse ox upper 90s on 4 L nasal cannula  - CXR 3/21 suggestive of pulm edema vs pneumonia - added IS, OOB as much as possible - weaned down to 2 L which is her baseline- pulse ox 94%   (A)COPD /suspect obstructive sleep apnea 04/12/12: FEV1 was 1.1 (47% predicted) and TLC 75%, DLCO 63%-  felt that this was due to a mix of COPD and obesity.  -Pulmonology was consulted  -Currently she is on Brovana, Pulmicort, DuoNeb's as well as a prednisone (Solumedrol 3/21 and 3/22) - taper Prednisone to 20 mg today, 10 tomorrow and 5 the next day- continue above nebs at SNF  (B) Fluid overload- possible acute on chronic dCHF - weight 102 kg on 3/5 and 126 kg on 3/21 >> 119 >> 107  -  2  D ECHO show grade 2 d CHF -  stopped Lasix 40 mg IV Q12 -  - also on Losartan/ HCTZ  - d/c HCTZ and continue Lasix 40 mg BID - will need daily weights at SNF and adjustment of Lasix based on weight, BP, renal function  (C) Newly diagnosed non-small cell lung cancer, 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 -Pulmonology consulted-  status post bronchoscopy on 3/19 with positive FNA -Radiation oncology and oncology are following - MRI of the brain to complete the staging was a poor study due to motion artifact - no mets were seen - started radiation therapy to the lung mass yesterday - will f/u with Dr Marin Olp LN biopsy need to be followed  Rash - generalized on arms, breasts, abdomen- macular in some areas and confluent in others- severely pruritic - she did not have it yesterday -  received a small amount of dye the day before for drain- other  new meds were Tramadol and Phenergan  which I have stopped - already on prednisone 60 mg - add Benadryl and Pepcid  - of note, had a rash earlier on in the admission attributed to medications - 3/26 AM- rash resolved- stop Benadryl, tapering Prednisone - cont Pepcid for GI protection  Left ant thigh pain - was occurring at home - was taking 5-6 Ibuprofen to control pain - ? Lateral cutaneous nerve compression-ie Meralgia paresthetica- started Neurontin - titrate up as needed - Dr Marin Olp ordered Venous duplex- this is negative - cont Ice packs  Acute appendicitis with perforation/peritoneal abscess -Initial cultures grew Enterococcus faecalis, patient has completed more than 2 weeks of antibiotics  -Management per primary service   Hypertension -Currently on Norvasc, Cozaar and metoprolol.  Hold HCTZ while on Lasix.  Likely obstructive sleep apnea -Continue BiPAP nightly - oupt f/u with pulm for sleep study  Tobacco abuse, in remission -quit in Feb of this year  Anemia -due to chronic illness, stable, no bleeding  Question of cirrhosis on CT scan -suspect NASH due to obesity -appears compensated, INR was normal at 1.15 on 3/6, normal platelets and LFTs were normal -monitor  Depression -continue Celexa  Chronic back pain -PT, further investigations as an outpatient.  -avoid narcotics given respiratory status - OOB  Morbid obesity Body mass index is 37.29 kg/m.   Pedal edema - use TEDS at SNF    DVT prophylaxis: Lovenox  Procedures:    12/04/16 CT IMAGE GUIDED DRAINAGE BY PERCUTANEOUS CATHETER [[id number] (Custom)]      12/14/16  CT PELVIC DRAIN VIA LEFT TRANSGLUTEAL APPROACH  12/17/16 Bronchoscopy w/ endobronchial FNA of bronchus intermedius mass, brushing of RMSB &EBUS w/ FNA  Antimicrobials:  Anti-infectives    Start     Dose/Rate Route Frequency Ordered Stop   12/17/16 1200  vancomycin (VANCOCIN) IVPB 1000 mg/200 mL premix  Status:   Discontinued     1,000 mg 200 mL/hr over 60 Minutes Intravenous Every 12 hours 12/17/16 0634 12/20/16 1044   12/16/16 1000  fluconazole (DIFLUCAN) tablet 200 mg  Status:  Discontinued     200 mg Oral Daily 12/16/16 0737 12/20/16 1046   12/14/16 0600  vancomycin (VANCOCIN) 1,250 mg in sodium chloride 0.9 % 250 mL IVPB  Status:  Discontinued     1,250 mg 166.7 mL/hr over 90 Minutes Intravenous Every 12 hours 12/13/16 1916 12/17/16 0547   12/10/16 1400  vancomycin (VANCOCIN) IVPB 1000 mg/200 mL premix  Status:  Discontinued     1,000 mg 100 mL/hr over 120 Minutes Intravenous Every 12 hours 12/10/16 1059 12/13/16 1916   12/08/16 1800  ertapenem (INVANZ) 1 g in sodium chloride 0.9 % 50 mL IVPB  Status:  Discontinued     1 g 100 mL/hr over 30 Minutes Intravenous Every 24 hours 12/08/16 1512 12/20/16 1044   12/08/16 1600  vancomycin (VANCOCIN) IVPB 750 mg/150 ml premix  Status:  Discontinued  750 mg 100 mL/hr over 90 Minutes Intravenous Every 12 hours 12/08/16 1513 12/10/16 1059   12/07/16 2300  vancomycin (VANCOCIN) IVPB 750 mg/150 ml premix  Status:  Discontinued     750 mg 150 mL/hr over 60 Minutes Intravenous Every 12 hours 12/07/16 1103 12/08/16 1513   12/07/16 2200  ciprofloxacin (CIPRO) IVPB 400 mg  Status:  Discontinued     400 mg 200 mL/hr over 60 Minutes Intravenous Every 12 hours 12/07/16 1122 12/08/16 1512   12/07/16 1115  vancomycin (VANCOCIN) 2,000 mg in sodium chloride 0.9 % 500 mL IVPB     2,000 mg 250 mL/hr over 120 Minutes Intravenous  Once 12/07/16 1103 12/07/16 1440   12/06/16 2200  ciprofloxacin (CIPRO) IVPB 400 mg  Status:  Discontinued     400 mg 200 mL/hr over 60 Minutes Intravenous Every 12 hours 12/06/16 1409 12/07/16 1039   12/06/16 1400  piperacillin-tazobactam (ZOSYN) IVPB 3.375 g  Status:  Discontinued     3.375 g 12.5 mL/hr over 240 Minutes Intravenous Every 8 hours 12/06/16 0643 12/06/16 0825   12/06/16 1000  ciprofloxacin (CIPRO) IVPB 200 mg  Status:   Discontinued     200 mg 100 mL/hr over 60 Minutes Intravenous Every 12 hours 12/06/16 0825 12/06/16 1409   12/06/16 1000  metroNIDAZOLE (FLAGYL) IVPB 500 mg  Status:  Discontinued     500 mg 100 mL/hr over 60 Minutes Intravenous Every 8 hours 12/06/16 0825 12/08/16 1512   12/04/16 0630  piperacillin-tazobactam (ZOSYN) 3.375 g in dextrose 5 % 50 mL IVPB  Status:  Discontinued     3.375 g 12.5 mL/hr over 240 Minutes Intravenous Every 8 hours 12/04/16 0623 12/06/16 0643   12/03/16 2130  piperacillin-tazobactam (ZOSYN) IVPB 3.375 g  Status:  Discontinued     3.375 g 12.5 mL/hr over 240 Minutes Intravenous Every 8 hours 12/03/16 2125 12/04/16 0623   12/03/16 1915  piperacillin-tazobactam (ZOSYN) IVPB 3.375 g     3.375 g 100 mL/hr over 30 Minutes Intravenous  Once 12/03/16 1907 12/03/16 2005       Objective: Vitals:   12/25/16 0758 12/25/16 0759 12/25/16 0800 12/25/16 1234  BP:    132/82  Pulse:    75  Resp:      Temp:    98 F (36.7 C)  TempSrc:    Oral  SpO2: 95% 95% 95% 94%  Weight:      Height:        Intake/Output Summary (Last 24 hours) at 12/25/16 1407 Last data filed at 12/25/16 1240  Gross per 24 hour  Intake             1335 ml  Output               20 ml  Net             1315 ml   Filed Weights   12/23/16 0404 12/24/16 0505 12/25/16 0459  Weight: 119.1 kg (262 lb 9.1 oz) 107.9 kg (237 lb 14 oz) 108 kg (238 lb 1.6 oz)    Examination: General exam: Appears comfortable  HEENT: PERRLA, oral mucosa moist, no sclera icterus or thrush Respiratory system: Clear to auscultation. Respiratory effort normal. Cardiovascular system: S1 & S2 heard, RRR.  No murmurs  Gastrointestinal system: Abdomen soft, non-tender, nondistended. Normal bowel sound. Drain in RLQ with yellowish discharge Central nervous system: Alert and oriented. No focal neurological deficits. Extremities: No cyanosis, clubbing or edema Skin: RASH: generalized on arms, breasts, abdomen-  macular in some  areas and confluent in others>> resolved Psychiatry:  Mood & affect appropriate.     Data Reviewed: I have personally reviewed following labs and imaging studies  CBC:  Recent Labs Lab 12/19/16 1006 12/20/16 0500 12/21/16 0400 12/22/16 0500 12/24/16 0354  WBC 10.1 11.9* 12.1* 11.2* 7.5  NEUTROABS  --   --   --   --  5.4  HGB 10.1* 9.8* 9.7* 9.9* 9.9*  HCT 31.6* 30.4* 30.1* 31.2* 29.9*  MCV 96.9 95.9 95.9 96.6 93.1  PLT 229 213 207 197 342   Basic Metabolic Panel:  Recent Labs Lab 12/19/16 1006 12/20/16 0500 12/21/16 0400 12/22/16 0500 12/23/16 0447 12/24/16 0354 12/25/16 0420  NA 140 135 136 136  --  135  --   K 3.8 4.5 4.2 4.1  --  3.4*  --   CL 100* 88* 86* 84*  --  80*  --   CO2 35* 42* 45* 47*  --  47*  --   GLUCOSE 109* 112* 87 78  --  80  --   BUN 14 21* 27* 34*  --  36*  --   CREATININE 0.63 0.92 0.80 0.82 0.86 0.90 0.90  CALCIUM 7.4* 8.3* 8.4* 8.4*  --  8.5*  --   MG  --   --   --   --   --  1.6*  --    GFR: Estimated Creatinine Clearance: 74.7 mL/min (by C-G formula based on SCr of 0.9 mg/dL). Liver Function Tests:  Recent Labs Lab 12/19/16 1006 12/22/16 0500  AST 13* 24  ALT 11* 23  ALKPHOS 63 59  BILITOT 0.5 0.6  PROT 5.1* 5.5*  ALBUMIN 2.1* 2.4*   No results for input(s): LIPASE, AMYLASE in the last 168 hours. No results for input(s): AMMONIA in the last 168 hours. Coagulation Profile: No results for input(s): INR, PROTIME in the last 168 hours. Cardiac Enzymes: No results for input(s): CKTOTAL, CKMB, CKMBINDEX, TROPONINI in the last 168 hours. BNP (last 3 results) No results for input(s): PROBNP in the last 8760 hours. HbA1C: No results for input(s): HGBA1C in the last 72 hours. CBG:  Recent Labs Lab 12/21/16 0751 12/21/16 0800 12/21/16 1531 12/21/16 1921  GLUCAP 111* 82 103* 152*   Lipid Profile: No results for input(s): CHOL, HDL, LDLCALC, TRIG, CHOLHDL, LDLDIRECT in the last 72 hours. Thyroid Function Tests: No results  for input(s): TSH, T4TOTAL, FREET4, T3FREE, THYROIDAB in the last 72 hours. Anemia Panel:  Recent Labs  12/25/16 0804  FERRITIN 426*  TIBC 262  IRON 105   Urine analysis:    Component Value Date/Time   COLORURINE YELLOW 12/04/2016 0425   APPEARANCEUR HAZY (A) 12/04/2016 0425   LABSPEC >1.046 (H) 12/04/2016 0425   PHURINE 5.0 12/04/2016 0425   GLUCOSEU NEGATIVE 12/04/2016 0425   HGBUR NEGATIVE 12/04/2016 0425   BILIRUBINUR NEGATIVE 12/04/2016 0425   KETONESUR NEGATIVE 12/04/2016 0425   PROTEINUR NEGATIVE 12/04/2016 0425   NITRITE NEGATIVE 12/04/2016 0425   LEUKOCYTESUR NEGATIVE 12/04/2016 0425   Sepsis Labs: '@LABRCNTIP'$ (procalcitonin:4,lacticidven:4) ) Recent Results (from the past 240 hour(s))  MRSA PCR Screening     Status: None   Collection Time: 12/19/16 10:06 AM  Result Value Ref Range Status   MRSA by PCR NEGATIVE NEGATIVE Final    Comment:        The GeneXpert MRSA Assay (FDA approved for NASAL specimens only), is one component of a comprehensive MRSA colonization surveillance program. It is not intended to  diagnose MRSA infection nor to guide or monitor treatment for MRSA infections.          Radiology Studies: Mr Jeri Cos Wo Contrast  Result Date: 12/25/2016 CLINICAL DATA:  71 y/o F; lung cancer, evaluate for intracranial metastatic disease. EXAM: MRI HEAD WITHOUT AND WITH CONTRAST TECHNIQUE: Multiplanar, multiecho pulse sequences of the brain and surrounding structures were obtained without and with intravenous contrast. CONTRAST:  25m MULTIHANCE GADOBENATE DIMEGLUMINE 529 MG/ML IV SOLN COMPARISON:  None. FINDINGS: Moderate to severe motion artifact of all sequences due to patient confusion. Brain: No acute infarction, hemorrhage, hydrocephalus, extra-axial collection or mass lesion. No abnormal enhancement identified. Foci of T2 FLAIR hyperintense signal abnormality in subcortical and periventricular white matter are nonspecific but compatible with mild  chronic microvascular ischemic changes. Mild brain parenchymal volume loss. Vascular: Normal flow voids. Skull and upper cervical spine: Normal marrow signal. Sinuses/Orbits: Small left maxillary sinus mucous retention cyst. Left mastoid air cell effusion. Bilateral intra-ocular lens replacement. Other: None. IMPRESSION: 1. Moderate to severe motion artifact. If clinically indicated, short interval follow-up when patient is better able to tolerate examination is recommended. 2. No definite evidence of intracranial metastatic disease. 3. Mild chronic microvascular ischemic changes and mild parenchymal volume loss of the brain. Electronically Signed   By: LKristine GarbeM.D.   On: 12/25/2016 00:37      Scheduled Meds: . sodium chloride   Intravenous Once  . arformoterol  15 mcg Nebulization Q12H  . budesonide (PULMICORT) nebulizer solution  0.5 mg Nebulization Q12H  . chlorhexidine  15 mL Mouth Rinse BID  . cholecalciferol  1,000 Units Oral Daily  . citalopram  10 mg Oral Daily  . enoxaparin (LOVENOX) injection  40 mg Subcutaneous Q24H  . famotidine  40 mg Oral BID  . furosemide  40 mg Oral BID  . gabapentin  100 mg Oral TID  . hydrALAZINE  25 mg Oral Q8H  . ipratropium-albuterol  3 mL Nebulization TID  . lip balm  1 application Topical BID  . losartan  100 mg Oral Daily  . mouth rinse  15 mL Mouth Rinse q12n4p  . metoprolol  150 mg Oral BID  . pantoprazole  40 mg Oral Q1200  . polyethylene glycol  17 g Oral Daily  . potassium chloride  40 mEq Oral BID  . [START ON 12/26/2016] predniSONE  20 mg Oral Q breakfast  . saccharomyces boulardii  250 mg Oral BID  . sodium chloride flush  3 mL Intravenous Q12H   Continuous Infusions:   LOS: 22 days    Time spent in minutes: 436   RHenrietta MD [facility] Pager: [email or web address] Password [email or web address]/27/2018, 2:07 PM

## 2016-12-25 NOTE — Progress Notes (Signed)
Alisha Fisher is doing fairly well. She has started radiation therapy.  She is using CPAP this morning. She has sleep apnea.  I'm still awaiting the path report from the right axillary node biopsy.  There is no evidence of thromboembolic disease in her legs. Over the weekend, she has some pain in the left thigh. We got a Doppler which was negative.  Her labs [place] a still show some anemia. I will check some iron studies on her. Her renal function is okay so that should not be an issue.  At some point, she probably is going to require some systemic chemotherapy. This will really  be dependent on the results of the axillary biopsy and also an outpatient PET scan.  On her physical exam, her vital signs all look pretty stable. Her blood pressure is 165/65. Temperature is 98.7. Overall, there is no change in her physical exam.  She will continue her radiation therapy. This is palliative.  We will await the path report from the right axillary node biopsy.  It is apparent that she is getting fantastic care from everybody up [place], [place]  Psalm 56:4

## 2016-12-26 ENCOUNTER — Ambulatory Visit
Admit: 2016-12-26 | Discharge: 2016-12-26 | Disposition: A | Discharge status: Home / Self Care | Payer: Medicare Other | Attending: Radiation Oncology | Admitting: Radiation Oncology

## 2016-12-26 LAB — BASIC METABOLIC PANEL
Anion gap: 7 (ref 5–15)
BUN: 31 mg/dL — ABNORMAL HIGH (ref 6–20)
CHLORIDE: 86 mmol/L — AB (ref 101–111)
CO2: 40 mmol/L — ABNORMAL HIGH (ref 22–32)
Calcium: 8.5 mg/dL — ABNORMAL LOW (ref 8.9–10.3)
Creatinine, Ser: 0.89 mg/dL (ref 0.44–1.00)
GFR calc Af Amer: >60 mL/min (ref >60)
GFR calc non Af Amer: >60 mL/min (ref >60)
GLUCOSE: 82 mg/dL (ref 65–99)
POTASSIUM: 4 mmol/L (ref 3.5–5.1)
SODIUM: 133 mmol/L — AB (ref 135–145)

## 2016-12-26 MED ORDER — FUROSEMIDE 10 MG/ML IJ SOLN
40.0000 mg | Freq: Two times a day (BID) | INTRAMUSCULAR | Status: DC
  Administered 2016-12-26: 40 mg via INTRAVENOUS
  Filled 2016-12-26: qty 4

## 2016-12-26 NOTE — Discharge Instructions (Signed)
Percutaneous Abscess Drain, Care After Clean site with soap and water daily, Irrigate drain with 5 ml of sterile normal saline every 8 hours   This sheet gives you information about how to care for yourself after your procedure. Your health care provider may also give you more specific instructions. If you have problems or questions, contact your health care provider. What can I expect after the procedure? After your procedure, it is common to have:  A small amount of bruising and discomfort in the area where the drainage tube (catheter) was placed.  Sleepiness and fatigue. This should go away after the medicines you were given have worn off. Follow these instructions at home: Incision care   Follow instructions from your health care provider about how to take care of your incision. Make sure you:  Wash your hands with soap and water before you change your bandage (dressing). If soap and water are not available, use hand sanitizer.  Change your dressing as told by your health care provider.  Leave stitches (sutures), skin glue, or adhesive strips in place. These skin closures may need to stay in place for 2 weeks or longer. If adhesive strip edges start to loosen and curl up, you may trim the loose edges. Do not remove adhesive strips completely unless your health care provider tells you to do that.  Check your incision area every day for signs of infection. Check for:  More redness, swelling, or pain.  More fluid or blood.  Warmth.  Pus or a bad smell.  Fluid leaking from around your catheter (instead of fluid draining through your catheter). Catheter care   Follow instructions from your health care provider about emptying and cleaning your catheter and collection bag. You may need to clean the catheter every day so it does not clog.  If directed, write down the following information every time you empty your bag:  The date and time.  The amount of drainage. General  instructions   Rest at home for 1-2 days after your procedure. Return to your normal activities as told by your health care provider.  Do not take baths, swim, or use a hot tub for 24 hours after your procedure, or until your health care provider says that this is okay.  Take over-the-counter and prescription medicines only as told by your health care provider.  Keep all follow-up visits as told by your health care provider. This is important. Contact a health care provider if:  You have less than 10 mL of drainage a day for 2-3 days in a row, or as directed by your health care provider.  You have more redness, swelling, or pain around your incision area.  You have more fluid or blood coming from your incision area.  Your incision area feels warm to the touch.  You have pus or a bad smell coming from your incision area.  You have fluid leaking from around your catheter (instead of through your catheter).  You have a fever or chills.  You have pain that does not get better with medicine. Get help right away if:  Your catheter comes out.  You suddenly stop having drainage from your catheter.  You suddenly have blood in the fluid that is draining from your catheter.  You become dizzy or you faint.  You develop a rash.  You have nausea or vomiting.  You have difficulty breathing or you feel short of breath.  You develop chest pain.  You have problems with your  speech or vision.  You have trouble balancing or moving your arms or legs. Summary  It is common to have a small amount of bruising and discomfort in the area where the drainage tube (catheter) was placed.  You may be directed to record the amount of drainage from the bag every time you empty it.  Follow instructions from your health care provider about emptying and cleaning your catheter and collection bag. This information is not intended to replace advice given to you by your health care provider. Make sure  you discuss any questions you have with your health care provider. Document Released: 02/01/2014 Document Revised: 08/09/2016 Document Reviewed: 08/09/2016 Elsevier Interactive Patient Education  2017 [place] Oxygen Use, Adult You are currently on 2 l/[place] When a medical condition keeps you from getting enough oxygen, your health care provider may instruct you to take extra oxygen at home. Your health care provider will let you know:  When to take oxygen.  For how long to take oxygen.  How quickly oxygen should be delivered (flow rate), in liters per minute (LPM or L/M). Home oxygen can be given through:  A mask.  A nasal cannula. This is a device or tube that goes in the nostrils.  A transtracheal catheter. This is a small, flexible tube placed in the trachea.  A tracheostomy. This is a surgically made opening in the trachea. These devices are connected with tubing to an oxygen source, such as:  A tank. Tanks hold oxygen in gas form. They must be replaced when the oxygen is used up.  A liquid oxygen device. This holds oxygen in liquid form. It must be replaced when the oxygen is used up.  An oxygen concentrator machine. This filters oxygen in the room. It uses electricity, so you must have a backup cylinder of oxygen in case the power goes out. Supplies needed: To use oxygen, you will need:  A mask, nasal cannula, transtracheal catheter, or tracheostomy.  An oxygen tank, a liquid oxygen device, or an oxygen concentrator.  The tape that your health care provider recommends (optional). If you use a transtracheal catheter and your prescribed flow rate is 1 LPM or greater, you will also need a humidifier. Risks and complications  Fire. This can happen if the oxygen is exposed to a heat source, flame, or spark.  Injury to skin. This can happen if liquid oxygen touches your skin.  Organ damage. This can happen if you get too little oxygen. How to use oxygen Your  health care provider will show you how to use your oxygen device. Follow her or his instructions. They may look something like this: 1. Wash your hands. 2. If you use an oxygen concentrator, make sure it is plugged in. 3. Place one end of the tube into the port on the tank, device, or machine. 4. Place the mask over your nose and mouth. Or, place the nasal cannula and secure it with tape if instructed. If you use a tracheostomy or transtracheal catheter, connect it to the oxygen source as directed. 5. Make sure the liter-flow setting on the machine is at the level prescribed by your health care provider. 6. Turn on the machine or adjust the knob on the tank or device to the correct liter-flow setting. 7. When you are done, turn off and unplug the machine, or turn the knob to OFF. How to clean and care for the oxygen supplies Nasal cannula   Clean it with a  warm, wet cloth daily or as needed.  Wash it with a liquid soap once a week.  Rinse it thoroughly once or twice a week.  Replace it every 2-4 weeks.  If you have an infection, such as a cold or pneumonia, change the cannula when you get better. Mask   Replace it every 2-4 weeks.  If you have an infection, such as a cold or pneumonia, change the mask when you get better. Humidifier bottle   Wash the bottle between each refill:  Wash it with soap and warm water.  Rinse it thoroughly.  Disinfect it and its top.  Air-dry it.  Make sure it is dry before you refill it. Oxygen concentrator   Clean the air filter at least twice a week according to directions from your home medical equipment and service company.  Wipe down the cabinet every day. To do this:  Unplug the unit.  Wipe down the cabinet with a damp cloth.  Dry the cabinet. Other equipment   Change any extra tubing every 1-3 months.  Follow instructions from your health care provider about taking care of any other equipment. Safety tips Fire safety tips     Keep your oxygen and oxygen supplies at least 5 ft away from sources of heat, flames, and sparks at all times.  Do not allow smoking near your oxygen. Put up "no smoking" signs in your home.  Do not use materials that can burn (are flammable) while you use oxygen.  When you go to a restaurant with portable oxygen, ask to be seated in the nonsmoking section.  Keep a Data processing manager close by. Let your fire department know that you have oxygen in your home.  Test your home smoke detectors regularly. General safety tips   If you use an oxygen cylinder, make sure it is in a stand or secured to an object that will not move (fixed object).  If you use liquid oxygen, make sure its container is kept upright.  If you use an oxygen concentrator:  Tell Loss adjuster, chartered company. Make sure you are given priority service in the event that your power goes out.  Avoid using extension cords, if possible. Follow these instructions at home:  Use oxygen only as told by your health care provider.  Do not use alcohol or other drugs that make you relax (sedating drugs) unless instructed. They can slow down your breathing rate and make it hard to get in enough oxygen.  Know how and when to order a refill of oxygen.  Always keep a spare tank of oxygen. Plan ahead for holidays when you may not be able to get a prescription filled.  Use water-based lubricants on your lips or nostrils. Do not use oil-based products like petroleum jelly.  To prevent skin irritation on your cheeks or behind your ears, tuck some gauze under the tubing. Contact a health care provider if:  You get headaches often.  You have shortness of breath.  You have a lasting cough.  You have anxiety.  You are sleepy all the time.  You develop an illness that affects your breathing.  You cannot exercise at your regular level.  You are restless.  You have difficult or irregular breathing, and it is getting worse.  You  have a fever.  You have persistent redness under your nose. Get help right away if:  You are confused.  You have blue lips or fingernails.  You are struggling to breathe. This information is not  intended to replace advice given to you by your health care provider. Make sure you discuss any questions you have with your health care provider. Document Released: 12/08/2003 Document Revised: 05/16/2016 Document Reviewed: 04/10/2016 Elsevier Interactive Patient Education  2017 [employer] Inc[employer]    CPAP and BiPAP Information CURRENTLY YOU ARE ON 16/6 WITH 4 liter of O2 blended into mix while sleeping. CPAP and BiPAP are methods of helping a person breathe with the use of air pressure. CPAP stands for "continuous positive airway pressure." BiPAP stands for "bi-level positive airway pressure." In both methods, air is blown through your nose or mouth and into your air passages to help you breathe well. CPAP and BiPAP use different amounts of pressure to blow air. With CPAP, the amount of pressure stays the same while you breathe in and out. With BiPAP, the amount of pressure is increased when you breathe in (inhale) so that you can take larger breaths. Your health care provider will recommend whether CPAP or BiPAP would be more helpful for you. Why are CPAP and BiPAP treatments used? CPAP or BiPAP can be helpful if you have:  Sleep apnea.  Chronic obstructive pulmonary disease (COPD).  Heart failure.  Medical conditions that weaken the muscles of the chest including muscular dystrophy, or neurological diseases such as amyotrophic lateral sclerosis (ALS).  Other problems that cause breathing to be weak, abnormal, or difficult. CPAP is most commonly used for obstructive sleep apnea (OSA) to keep the airways from collapsing when the muscles relax during sleep. How is CPAP or BiPAP administered? Both CPAP and BiPAP are provided by a small machine with a flexible plastic tube that attaches to a  plastic mask. You wear the mask. Air is blown through the mask into your nose or mouth. The amount of pressure that is used to blow the air can be adjusted on the machine. Your health care provider will determine the pressure setting that should be used based on your individual needs. When should CPAP or BiPAP be used? In most cases, the mask only needs to be worn during sleep. Generally, the mask needs to be worn throughout the night and during any daytime naps. People with certain medical conditions may also need to wear the mask at other times when they are awake. Follow instructions from your health care provider about when to use the machine. What are some tips for using the mask?  Because the mask needs to be snug, some people feel trapped or closed-in (claustrophobic) when first using the mask. If you feel this way, you may need to get used to the mask. One way to do this is by holding the mask loosely over your nose or mouth and then gradually applying the mask more snugly. You can also gradually increase the amount of time that you use the mask.  Masks are available in various types and sizes. Some fit over your mouth and nose while others fit over just your nose. If your mask does not fit well, talk with your health care provider about getting a different one.  If you are using a mask that fits over your nose and you tend to breathe through your mouth, a chin strap may be applied to help keep your mouth closed.  The CPAP and BiPAP machines have alarms that may sound if the mask comes off or develops a leak.  If you have trouble with the mask, it is very important that you talk with your health care provider about finding  a way to make the mask easier to tolerate. Do not stop using the mask. Stopping the use of the mask could have a negative impact on your health. What are some tips for using the machine?  Place your CPAP or BiPAP machine on a secure table or stand near an electrical  outlet.  Know where the on/off switch is located on the machine.  Follow instructions from your health care provider about how to set the pressure on your machine and when you should use it.  Do not eat or drink while the CPAP or BiPAP machine is on. Food or fluids could get pushed into your lungs by the pressure of the CPAP or BiPAP.  Do not smoke. Tobacco smoke residue can damage the machine.  For home use, CPAP and BiPAP machines can be rented or purchased through home health care companies. Many different brands of machines are available. Renting a machine before purchasing may help you find out which particular machine works well for you.  Keep the CPAP or BiPAP machine and attachments clean. Ask your health care provider for specific instructions. Get help right away if:  You have redness or open areas around your nose or mouth where the mask fits.  You have trouble using the CPAP or BiPAP machine.  You cannot tolerate wearing the CPAP or BiPAP mask.  You have pain, discomfort, and bloating in your abdomen. Summary  CPAP and BiPAP are methods of helping a person breathe with the use of air pressure.  Both CPAP and BiPAP are provided by a small machine with a flexible plastic tube that attaches to a plastic mask.  If you have trouble with the mask, it is very important that you talk with your health care provider about finding a way to make the mask easier to tolerate. This information is not intended to replace advice given to you by your health care provider. Make sure you discuss any questions you have with your health care provider. Document Released: 06/15/2004 Document Revised: 08/06/2016 Document Reviewed: 08/06/2016 Elsevier Interactive Patient Education  2017 [employer].

## 2016-12-26 NOTE — Clinical Social Work Placement (Signed)
Patient received and accepted bed offer at [place].Patient's to be transported via [place], patient's family notified. Patient's RN can call report to [phone], patient going to room 401B.   CLINICAL SOCIAL WORK PLACEMENT  NOTE  Date:  12/26/2016  Patient Details  Name: Alisha Fisher MRN: [id number] Date of Birth: 1946/09/08  Clinical Social Work is seeking post-discharge placement for this patient at the [place] level of care (*CSW will initial, date and re-position this form in  chart as items are completed):  Yes   Patient/family provided with [facility] Work Department's list of facilities offering this level of care within the geographic area requested by the patient (or if unable, by the patient's family).  Yes   Patient/family informed of their freedom to choose among providers that offer the needed level of care, that participate in Medicare, Medicaid or managed care program needed by the patient, have an available bed and are willing to accept the patient.  Yes   Patient/family informed of [facility]'s ownership interest in [facility] and [facility], as well as of the fact that they are under no obligation to receive care at these facilities.  PASRR submitted to EDS on 12/16/16     PASRR number received on 12/16/16     Existing PASRR number confirmed on       FL2 transmitted to all facilities in geographic area requested by pt/family on 12/16/16     FL2 transmitted to all facilities within larger geographic area on       Patient informed that his/her managed care company has contracts with or will negotiate with certain facilities, including the following:        Yes   Patient/family informed of bed offers received.  Patient chooses bed at [place], [place]     Physician recommends and patient chooses bed at      Patient to be transferred to [place], [place] on 12/26/16.  Patient to be transferred  to facility by [facility]     Patient family notified on 12/26/16 of transfer.  Name of family member notified:  [name]      PHYSICIAN       Additional Comment:    _______________________________________________ Burnis Medin, LCSW 12/26/2016, 2:38 PM

## 2016-12-26 NOTE — Progress Notes (Signed)
[facility] Therapy Treatment Record Phone [phone]   EBRAXENMM Therapy was administered to [facility] on: 12/26/2016  9:48 AM and was treatment # 4 out of a planned course of 10 treatments.  Radiation Treatment  1). Beam photons with 6-10 energy  2). Brachytherapy None  3). Stereotactic Radiosurgery None  4). Other Radiation None     Bennett Scrape, RT (T)

## 2016-12-26 NOTE — Progress Notes (Signed)
9 Days Post-Op  Subjective: Alisha Fisher looks fine. Nursing does not know if the drain is being flushed, but what is coming out is still feculent.  The butterfly drain holder needs to be replaced, I will call IR and ask them to see.  I would like for there to get Alisha Fisher radiation Rx and let Medicine see Alisha Fisher one more time and be sure they are happy with Medications.  Alisha Fisher does not know who Alisha Fisher PCP is so I cannot currently send them information.   Objective: Vital signs in last 24 hours: Temp:  [97.7 F (36.5 C)-98 F (36.7 C)] 97.8 F (36.6 C) (03/28 0523) Pulse Rate:  [65-75] 65 (03/28 0523) Resp:  [18] 18 (03/28 0523) BP: (132-152)/(54-82) 152/69 (03/28 0523) SpO2:  [93 %-98 %] 95 % (03/28 0523) Weight:  [110.5 kg (243 lb 9.7 oz)] 110.5 kg (243 lb 9.7 oz) (03/28 0523) Last BM Date: 12/24/16 2040 PO Urine 601 recorded Drain 0 Afebrile, VSS Labs OK MRI 3/26: 1. Moderate to severe motion artifact. If clinically indicated, short interval follow-up when Alisha Fisher is better able to tolerate examination is recommended. 2. No definite evidence of intracranial metastatic disease. 3. Mild chronic microvascular ischemic changes and mild parenchymal volume loss of the brain. Intake/Output from previous day: 03/27 0701 - 03/28 0700 In: 2040 [P.O.:2040] Out: 601 [Urine:601] Intake/Output this shift: No intake/output data recorded.  General appearance: alert, cooperative and no distress Resp: clear to auscultation bilaterally and anterior GI: soft, non-tender; bowel sounds normal; no masses,  no organomegaly and drain is in place, holder needs to be redone.  still draining stool.   Extremities: dry skin all over, much less edema, rash is stable.    Lab Results:   Recent Labs  12/24/16 0354  WBC 7.5  HGB 9.9*  HCT 29.9*  PLT 203    BMET  Recent Labs  12/24/16 0354 12/25/16 0420 12/26/16 0445  NA 135  --  133*  K 3.4*  --  4.0  CL 80*  --  86*  CO2 47*  --  40*  GLUCOSE 80  --  82   BUN 36*  --  31*  CREATININE 0.90 0.90 0.89  CALCIUM 8.5*  --  8.5*   PT/INR No results for input(s): LABPROT, INR in the last 72 hours.   Recent Labs Lab 12/19/16 1006 12/22/16 0500  AST 13* 24  ALT 11* 23  ALKPHOS 63 59  BILITOT 0.5 0.6  PROT 5.1* 5.5*  ALBUMIN 2.1* 2.4*     Lipase     Component Value Date/Time   LIPASE 15 12/03/2016 1613     Studies/Results: Mr Jeri Cos FA Contrast  Result Date: 12/25/2016 CLINICAL DATA:  71 y/o F; lung cancer, evaluate for intracranial metastatic disease. EXAM: MRI HEAD WITHOUT AND WITH CONTRAST TECHNIQUE: Multiplanar, multiecho pulse sequences of the brain and surrounding structures were obtained without and with intravenous contrast. CONTRAST:  42m MULTIHANCE GADOBENATE DIMEGLUMINE 529 MG/ML IV SOLN COMPARISON:  None. FINDINGS: Moderate to severe motion artifact of all sequences due to Alisha Fisher confusion. Brain: No acute infarction, hemorrhage, hydrocephalus, extra-axial collection or mass lesion. No abnormal enhancement identified. Foci of T2 FLAIR hyperintense signal abnormality in subcortical and periventricular white matter are nonspecific but compatible with mild chronic microvascular ischemic changes. Mild brain parenchymal volume loss. Vascular: Normal flow voids. Skull and upper cervical spine: Normal marrow signal. Sinuses/Orbits: Small left maxillary sinus mucous retention cyst. Left mastoid air cell effusion. Bilateral intra-ocular lens replacement. Other: None.  IMPRESSION: 1. Moderate to severe motion artifact. If clinically indicated, short interval follow-up when Alisha Fisher is better able to tolerate examination is recommended. 2. No definite evidence of intracranial metastatic disease. 3. Mild chronic microvascular ischemic changes and mild parenchymal volume loss of the brain. Electronically Signed   By: Kristine Garbe M.D.   On: 12/25/2016 00:37    Medications: . sodium chloride   Intravenous Once  . arformoterol  15  mcg Nebulization Q12H  . budesonide (PULMICORT) nebulizer solution  0.5 mg Nebulization Q12H  . chlorhexidine  15 mL Mouth Rinse BID  . cholecalciferol  1,000 Units Oral Daily  . citalopram  10 mg Oral Daily  . enoxaparin (LOVENOX) injection  40 mg Subcutaneous Q24H  . famotidine  40 mg Oral BID  . furosemide  40 mg Oral BID  . gabapentin  100 mg Oral TID  . hydrALAZINE  25 mg Oral Q8H  . ipratropium-albuterol  3 mL Nebulization TID  . lip balm  1 application Topical BID  . losartan  100 mg Oral Daily  . mouth rinse  15 mL Mouth Rinse q12n4p  . metoprolol  150 mg Oral BID  . pantoprazole  40 mg Oral Q1200  . polyethylene glycol  17 g Oral Daily  . predniSONE  20 mg Oral Q breakfast  . saccharomyces boulardii  250 mg Oral BID  . sodium chloride flush  3 mL Intravenous Q12H    Assessment/Plan Perforated appendicitis - s/p CT guided pelvic drain placement 12/04/16/ CT scan 12/21/16 drain in cecum =>> cecostomy tube - s/p CT scan and IR drain placed 12/14/16, left trans gluteal approach >> removed 3/23  Anasarca/ very positive fluid balance  - fluid overload- lasix 40 mg q12h x7 days. 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 Nestor - BRONCHIAL BRUSHING, RIGHT MAINSTEM(SPECIMEN 4 OF 4 COLLECTED 12/17/16): SQUAMOUS CELL CARCINOMA. -Radiation Rx: started -IR axillary node bx 3/23: Pending -Brain MRI for staging complete  Rash - Concerns with CIPRO/Flagyl and Zosyn  allergies Malnutrition - Prealbumin 14.7 (3/21) HTN Tobacco abuse - quit 11/2016 Anemia of chronic disease ?Cirrhosis Morbid obesity Depression Chronic back pain  SVX:BLTJ diet ID:day 16antibiotics Zosyn 12/03/16-12/05/16; Cipro/flagyl 3/8-3/10/18; Both stopped for rash/possible allergy - rash is improving Invanz 3/10 =>>day 12Vancomycin 3/09=>>day14Diflucan 3/18 =>>day 5 Alisha Fisher did not get Invanz or diflucan 1 day during this course.  VTE: Lovenox  Plan:  Transfer to SNF today.  Alisha Fisher needs IR to see Alisha Fisher and address drain, Radiation Rx, and Medicine to review medicines for discharge one more time to ber sure we are not missing anything.      LOS: 23 days    Alisha Fisher 12/26/2016 [phone]

## 2016-12-27 ENCOUNTER — Ambulatory Visit
Admission: RE | Admit: 2016-12-27 | Discharge: 2016-12-27 | Disposition: A | Discharge status: Home / Self Care | Payer: Medicare Other | Source: Ambulatory Visit | Attending: Radiation Oncology | Admitting: Radiation Oncology

## 2016-12-28 ENCOUNTER — Ambulatory Visit
Admission: RE | Admit: 2016-12-28 | Discharge: 2016-12-28 | Disposition: A | Discharge status: Home / Self Care | Payer: Medicare Other | Source: Ambulatory Visit | Attending: Radiation Oncology | Admitting: Radiation Oncology

## 2016-12-28 DIAGNOSIS — C3491 Malignant neoplasm of unspecified part of right bronchus or lung: Secondary | ICD-10-CM

## 2016-12-28 MED ORDER — BIAFINE EX EMUL
Freq: Every day | CUTANEOUS | Status: DC
  Administered 2016-12-28: 13:00:00 via TOPICAL

## 2016-12-30 ENCOUNTER — Other Ambulatory Visit: Payer: Self-pay

## 2016-12-30 ENCOUNTER — Emergency Department (HOSPITAL_COMMUNITY): Payer: Medicare Other

## 2016-12-30 ENCOUNTER — Inpatient Hospital Stay (HOSPITAL_COMMUNITY)
Admission: EM | Admit: 2016-12-30 | Discharge: 2017-01-01 | DRG: 291 | Disposition: A | Discharge status: Skilled Nursing Facility | Payer: Medicare Other | Attending: Internal Medicine | Admitting: Internal Medicine

## 2016-12-30 ENCOUNTER — Encounter (HOSPITAL_COMMUNITY): Payer: Self-pay | Admitting: Internal Medicine

## 2016-12-30 DIAGNOSIS — I110 Hypertensive heart disease with heart failure: Principal | ICD-10-CM | POA: Diagnosis present

## 2016-12-30 DIAGNOSIS — Z87891 Personal history of nicotine dependence: Secondary | ICD-10-CM

## 2016-12-30 DIAGNOSIS — R0689 Other abnormalities of breathing: Secondary | ICD-10-CM

## 2016-12-30 DIAGNOSIS — I509 Heart failure, unspecified: Secondary | ICD-10-CM

## 2016-12-30 DIAGNOSIS — C3431 Malignant neoplasm of lower lobe, right bronchus or lung: Secondary | ICD-10-CM | POA: Diagnosis present

## 2016-12-30 DIAGNOSIS — K352 Acute appendicitis with generalized peritonitis: Secondary | ICD-10-CM | POA: Diagnosis present

## 2016-12-30 DIAGNOSIS — K3533 Acute appendicitis with perforation and localized peritonitis, with abscess: Secondary | ICD-10-CM

## 2016-12-30 DIAGNOSIS — R0602 Shortness of breath: Secondary | ICD-10-CM | POA: Diagnosis not present

## 2016-12-30 DIAGNOSIS — I5032 Chronic diastolic (congestive) heart failure: Secondary | ICD-10-CM

## 2016-12-30 DIAGNOSIS — I5031 Acute diastolic (congestive) heart failure: Secondary | ICD-10-CM

## 2016-12-30 DIAGNOSIS — C3491 Malignant neoplasm of unspecified part of right bronchus or lung: Secondary | ICD-10-CM | POA: Diagnosis present

## 2016-12-30 DIAGNOSIS — J9621 Acute and chronic respiratory failure with hypoxia: Secondary | ICD-10-CM | POA: Diagnosis present

## 2016-12-30 DIAGNOSIS — Z923 Personal history of irradiation: Secondary | ICD-10-CM

## 2016-12-30 DIAGNOSIS — J441 Chronic obstructive pulmonary disease with (acute) exacerbation: Secondary | ICD-10-CM

## 2016-12-30 DIAGNOSIS — Z7951 Long term (current) use of inhaled steroids: Secondary | ICD-10-CM

## 2016-12-30 DIAGNOSIS — Z9981 Dependence on supplemental oxygen: Secondary | ICD-10-CM

## 2016-12-30 DIAGNOSIS — J449 Chronic obstructive pulmonary disease, unspecified: Secondary | ICD-10-CM | POA: Diagnosis present

## 2016-12-30 DIAGNOSIS — I5033 Acute on chronic diastolic (congestive) heart failure: Secondary | ICD-10-CM | POA: Diagnosis present

## 2016-12-30 LAB — CBC WITH DIFFERENTIAL/PLATELET
Basophils Absolute: 0 10*3/uL (ref 0.0–0.1)
Basophils Relative: 0 %
EOS ABS: 0.3 10*3/uL (ref 0.0–0.7)
Eosinophils Relative: 3 %
HEMATOCRIT: 30.2 % — AB (ref 36.0–46.0)
HEMOGLOBIN: 9.7 g/dL — AB (ref 12.0–15.0)
LYMPHS ABS: 0.5 10*3/uL — AB (ref 0.7–4.0)
Lymphocytes Relative: 5 %
MCH: 31.5 pg (ref 26.0–34.0)
MCHC: 32.1 g/dL (ref 30.0–36.0)
MCV: 98.1 fL (ref 78.0–100.0)
MONO ABS: 0.9 10*3/uL (ref 0.1–1.0)
MONOS PCT: 9 %
NEUTROS ABS: 8.8 10*3/uL — AB (ref 1.7–7.7)
NEUTROS PCT: 83 %
Platelets: 257 10*3/uL (ref 150–400)
RBC: 3.08 MIL/uL — ABNORMAL LOW (ref 3.87–5.11)
RDW: 15.6 % — AB (ref 11.5–15.5)
WBC: 10.5 10*3/uL (ref 4.0–10.5)

## 2016-12-30 LAB — COMPREHENSIVE METABOLIC PANEL
ALK PHOS: 55 U/L (ref 38–126)
ALT: 14 U/L (ref 14–54)
AST: 14 U/L — ABNORMAL LOW (ref 15–41)
Albumin: 3.1 g/dL — ABNORMAL LOW (ref 3.5–5.0)
Anion gap: 6 (ref 5–15)
BUN: 26 mg/dL — ABNORMAL HIGH (ref 6–20)
CALCIUM: 8.5 mg/dL — AB (ref 8.9–10.3)
CO2: 37 mmol/L — AB (ref 22–32)
CREATININE: 0.76 mg/dL (ref 0.44–1.00)
Chloride: 95 mmol/L — ABNORMAL LOW (ref 101–111)
GFR calc Af Amer: >60 mL/min (ref >60)
GFR calc non Af Amer: >60 mL/min (ref >60)
GLUCOSE: 96 mg/dL (ref 65–99)
Potassium: 4.6 mmol/L (ref 3.5–5.1)
Sodium: 138 mmol/L (ref 135–145)
Total Bilirubin: 0.6 mg/dL (ref 0.3–1.2)
Total Protein: 5.9 g/dL — ABNORMAL LOW (ref 6.5–8.1)

## 2016-12-30 LAB — BRAIN NATRIURETIC PEPTIDE: B NATRIURETIC PEPTIDE 5: 626.5 pg/mL — AB (ref 0.0–100.0)

## 2016-12-30 LAB — MAGNESIUM: Magnesium: 1.8 mg/dL (ref 1.7–2.4)

## 2016-12-30 LAB — TSH: TSH: 2.356 u[IU]/mL (ref 0.350–4.500)

## 2016-12-30 LAB — I-STAT TROPONIN, ED: Troponin i, poc: 0.01 ng/mL (ref 0.00–0.08)

## 2016-12-30 LAB — TROPONIN I: Troponin I: <0.03 ng/mL (ref <0.03)

## 2016-12-30 MED ORDER — FAMOTIDINE 20 MG PO TABS
40.0000 mg | ORAL_TABLET | Freq: Two times a day (BID) | ORAL | Status: DC
  Administered 2016-12-30 – 2017-01-01 (×4): 40 mg via ORAL
  Filled 2016-12-30 (×4): qty 2

## 2016-12-30 MED ORDER — PREDNISONE 20 MG PO TABS: 20.0000 mg | ORAL_TABLET | Freq: Every day | ORAL | Status: DC

## 2016-12-30 MED ORDER — ARFORMOTEROL TARTRATE 15 MCG/2ML IN NEBU
15.0000 ug | INHALATION_SOLUTION | Freq: Two times a day (BID) | RESPIRATORY_TRACT | Status: DC
  Administered 2016-12-30 – 2017-01-01 (×4): 15 ug via RESPIRATORY_TRACT
  Filled 2016-12-30 (×4): qty 2

## 2016-12-30 MED ORDER — FUROSEMIDE 10 MG/ML IJ SOLN
40.0000 mg | Freq: Two times a day (BID) | INTRAMUSCULAR | Status: DC
  Administered 2016-12-31: 40 mg via INTRAVENOUS
  Filled 2016-12-30 (×2): qty 4

## 2016-12-30 MED ORDER — BUDESONIDE 0.5 MG/2ML IN SUSP
0.5000 mg | Freq: Two times a day (BID) | RESPIRATORY_TRACT | Status: DC
  Administered 2016-12-30 – 2017-01-01 (×4): 0.5 mg via RESPIRATORY_TRACT
  Filled 2016-12-30 (×4): qty 2

## 2016-12-30 MED ORDER — SODIUM CHLORIDE 0.9 % IV SOLN: 250.0000 mL | INTRAVENOUS | Status: DC | PRN

## 2016-12-30 MED ORDER — ALBUTEROL SULFATE (2.5 MG/3ML) 0.083% IN NEBU: 2.5000 mg | INHALATION_SOLUTION | Freq: Four times a day (QID) | RESPIRATORY_TRACT | Status: DC | PRN

## 2016-12-30 MED ORDER — METOPROLOL TARTRATE 50 MG PO TABS
150.0000 mg | ORAL_TABLET | Freq: Two times a day (BID) | ORAL | Status: DC
Start: 2016-12-30 — End: 2017-01-01
  Administered 2016-12-30 – 2017-01-01 (×4): 150 mg via ORAL
  Filled 2016-12-30 (×7): qty 3

## 2016-12-30 MED ORDER — ACETAMINOPHEN 325 MG PO TABS: 650.0000 mg | ORAL_TABLET | Freq: Four times a day (QID) | ORAL | Status: DC | PRN

## 2016-12-30 MED ORDER — ONDANSETRON 4 MG PO TBDP: 4.0000 mg | ORAL_TABLET | Freq: Four times a day (QID) | ORAL | Status: DC | PRN

## 2016-12-30 MED ORDER — ORAL CARE MOUTH RINSE
15.0000 mL | Freq: Two times a day (BID) | OROMUCOSAL | Status: DC
  Administered 2016-12-31 – 2017-01-01 (×2): 15 mL via OROMUCOSAL

## 2016-12-30 MED ORDER — ALBUTEROL SULFATE (2.5 MG/3ML) 0.083% IN NEBU
5.0000 mg | INHALATION_SOLUTION | Freq: Once | RESPIRATORY_TRACT | Status: AC
  Administered 2016-12-30: 5 mg via RESPIRATORY_TRACT
  Filled 2016-12-30: qty 6

## 2016-12-30 MED ORDER — METHYLPREDNISOLONE SODIUM SUCC 125 MG IJ SOLR
125.0000 mg | Freq: Once | INTRAMUSCULAR | Status: AC
  Administered 2016-12-30: 125 mg via INTRAVENOUS
  Filled 2016-12-30: qty 2

## 2016-12-30 MED ORDER — FUROSEMIDE 10 MG/ML IJ SOLN
40.0000 mg | Freq: Once | INTRAMUSCULAR | Status: AC
  Administered 2016-12-30: 40 mg via INTRAVENOUS
  Filled 2016-12-30: qty 4

## 2016-12-30 MED ORDER — POTASSIUM CHLORIDE CRYS ER 20 MEQ PO TBCR
40.0000 meq | EXTENDED_RELEASE_TABLET | Freq: Two times a day (BID) | ORAL | Status: DC
  Administered 2016-12-30 – 2016-12-31 (×2): 40 meq via ORAL
  Filled 2016-12-30 (×2): qty 2

## 2016-12-30 MED ORDER — SODIUM CHLORIDE 0.9% FLUSH: 3.0000 mL | INTRAVENOUS | Status: DC | PRN

## 2016-12-30 MED ORDER — NICOTINE 21 MG/24HR TD PT24
21.0000 mg | MEDICATED_PATCH | Freq: Every day | TRANSDERMAL | Status: DC
  Administered 2016-12-31 – 2017-01-01 (×2): 21 mg via TRANSDERMAL
  Filled 2016-12-30 (×2): qty 1

## 2016-12-30 MED ORDER — PREDNISONE 5 MG PO TABS
5.0000 mg | ORAL_TABLET | Freq: Every day | ORAL | Status: AC
  Administered 2017-01-01: 5 mg via ORAL
  Filled 2016-12-30: qty 1

## 2016-12-30 MED ORDER — POLYETHYLENE GLYCOL 3350 17 G PO PACK
17.0000 g | PACK | Freq: Every day | ORAL | Status: DC
  Administered 2016-12-31: 17 g via ORAL
  Filled 2016-12-30 (×2): qty 1

## 2016-12-30 MED ORDER — ALBUTEROL SULFATE HFA 108 (90 BASE) MCG/ACT IN AERS: 2.0000 | INHALATION_SPRAY | Freq: Four times a day (QID) | RESPIRATORY_TRACT | Status: DC | PRN

## 2016-12-30 MED ORDER — ENOXAPARIN SODIUM 30 MG/0.3ML ~~LOC~~ SOLN
30.0000 mg | SUBCUTANEOUS | Status: DC
  Administered 2016-12-30: 30 mg via SUBCUTANEOUS
  Filled 2016-12-30: qty 0.3

## 2016-12-30 MED ORDER — SODIUM CHLORIDE 0.9% FLUSH
3.0000 mL | Freq: Two times a day (BID) | INTRAVENOUS | Status: DC
  Administered 2016-12-30 – 2017-01-01 (×4): 3 mL via INTRAVENOUS

## 2016-12-30 MED ORDER — HYDROXYZINE HCL 25 MG PO TABS
25.0000 mg | ORAL_TABLET | Freq: Three times a day (TID) | ORAL | Status: DC | PRN
  Administered 2016-12-31: 25 mg via ORAL
  Filled 2016-12-30: qty 1

## 2016-12-30 MED ORDER — ONDANSETRON HCL 4 MG/2ML IJ SOLN: 4.0000 mg | Freq: Four times a day (QID) | INTRAMUSCULAR | Status: DC | PRN

## 2016-12-30 MED ORDER — VITAMIN D3 25 MCG (1000 UNIT) PO TABS
5000.0000 [IU] | ORAL_TABLET | Freq: Every day | ORAL | Status: DC
  Administered 2016-12-31 – 2017-01-01 (×2): 5000 [IU] via ORAL
  Filled 2016-12-30 (×2): qty 5

## 2016-12-30 MED ORDER — IOPAMIDOL (ISOVUE-370) INJECTION 76%
INTRAVENOUS | Status: AC
  Administered 2016-12-30: 100 mL via INTRAVENOUS
  Filled 2016-12-30: qty 100

## 2016-12-30 MED ORDER — PREDNISONE 5 MG PO TABS
10.0000 mg | ORAL_TABLET | Freq: Every day | ORAL | Status: AC
  Administered 2016-12-31: 10 mg via ORAL
  Filled 2016-12-30: qty 2

## 2016-12-30 MED ORDER — IPRATROPIUM BROMIDE 0.02 % IN SOLN
0.5000 mg | Freq: Once | RESPIRATORY_TRACT | Status: AC
  Administered 2016-12-30: 0.5 mg via RESPIRATORY_TRACT
  Filled 2016-12-30: qty 2.5

## 2016-12-30 MED ORDER — LOSARTAN POTASSIUM 50 MG PO TABS
50.0000 mg | ORAL_TABLET | Freq: Every day | ORAL | Status: DC
  Administered 2016-12-30 – 2017-01-01 (×3): 50 mg via ORAL
  Filled 2016-12-30 (×3): qty 1

## 2016-12-30 MED ORDER — GABAPENTIN 100 MG PO CAPS
100.0000 mg | ORAL_CAPSULE | Freq: Three times a day (TID) | ORAL | Status: DC
  Administered 2016-12-30 – 2017-01-01 (×5): 100 mg via ORAL
  Filled 2016-12-30 (×5): qty 1

## 2016-12-30 NOTE — ED Provider Notes (Signed)
[facility] DEPT Provider Note   CSN: [id number] Arrival date & time: 12/30/16  1124     History   Chief Complaint Chief Complaint  Patient presents with  . Shortness of Breath    HPI Alisha Fisher is a 71 y.o. female hx of HTN, COPD on 2 L [place], CHF, Right lung small cell lung cancer here with Shortness of breath. Patient was recently admitted at a complicated hospital course. Patient was admitted for ruptured appendicitis with abscess. Patient had a drain placed and is still present today. She went into respiratory distress and was thought to be from fluid overload and required diuresis and bipap. Patient was in the nursing home for the last 3 days. Patient woke up this morning has worsening shortness of breath. The nursing home gave her 2 puffs of albuterol and the symptoms improved. Patient denies any fevers or chills and the cough is stable. Patient was sent by EMS and was not hypoxic on 2 L nasal cannula. Patient's vitals were stable en route per EMS. Patient states that she has been very immobile since the recent hospitalization. Patient is currently still on radiation daily for her lung cancer. She has no history of blood clots in the past but the nursing home was concerned for possible PE.    The history is provided by the patient.    Past Medical History:  Diagnosis Date  . COPD, severe ([place])   . HTN (hypertension)   . Pneumonia   . Pulmonary emphysema ([place])   . SOB (shortness of breath)     Patient Active Problem List   Diagnosis Date Noted  . Squamous cell lung cancer, right ([place]) 12/25/2016  . Acute respiratory failure with hypoxia ([place]) 12/25/2016  . Rash and nonspecific skin eruption   . Acute pain of left thigh   . Primary cancer of right middle lobe of lung ([place]) 12/20/2016  . Acute on chronic respiratory failure with hypoxia and hypercapnia ([place]) 12/18/2016  . Essential hypertension 12/18/2016  . Anemia 12/18/2016  . Endobronchial mass   . Hypokalemia  12/16/2016  . Hypomagnesemia 12/16/2016  . Intraperitoneal abscess s/p perc drainage 12/14/2016 12/15/2016  . Acute appendicitis with perforation and peritoneal abscess 12/03/2016  . Obesity 05/25/2012  . Smoker 05/25/2012  . COPD (chronic obstructive pulmonary disease) ([place]) 05/25/2012  . Dyspnea 04/11/2012    Past Surgical History:  Procedure Laterality Date  . CATARACT EXTRACTION    . ENDOBRONCHIAL ULTRASOUND Bilateral 12/17/2016   Procedure: ENDOBRONCHIAL ULTRASOUND;  Surgeon: Javier Glazier, MD;  Location: [facility] ENDOSCOPY;  Service: Cardiopulmonary;  Laterality: Bilateral;  . IR GENERIC HISTORICAL  12/21/2016   IR SINUS/FIST TUBE CHK-NON GI 12/21/2016 [facility]INTER[facility]  . IR GENERIC HISTORICAL  12/21/2016   IR US GUIDE BX ASP/DRAIN 12/21/2016 Corrie Mckusick, DO [facility]INTER[facility]    OB History    No data available       Home Medications    Prior to Admission medications   Medication Sig Start Date End Date Taking? Authorizing Provider  acetaminophen (TYLENOL) 325 MG tablet Take 2 tablets (650 mg total) by mouth every 6 (six) hours as needed for mild pain (breakthrough pain). 12/25/16  Yes Earnstine Regal, PA-C  albuterol (PROVENTIL HFA;VENTOLIN HFA) 108 (90 Base) MCG/ACT inhaler Inhale 2 puffs into the lungs every 6 (six) hours as needed for wheezing or shortness of breath.   Yes Historical Provider, MD  arformoterol (BROVANA) 15 MCG/2ML NEBU Take 2 mLs (15 mcg total) by nebulization every 12 (twelve)  hours. 12/25/16  Yes Debbe Odea, MD  budesonide (PULMICORT) 0.5 MG/2ML nebulizer solution Take 2 mLs (0.5 mg total) by nebulization every 12 (twelve) hours. 12/25/16  Yes Debbe Odea, MD  Cholecalciferol (VITAMIN D PO) Take 5,000 Units by mouth daily.    Yes Historical Provider, MD  escitalopram (LEXAPRO) 10 MG tablet Take 10 mg by mouth daily.   Yes Historical Provider, MD  famotidine (PEPCID) 40 MG tablet Take 1 tablet (40 mg total) by mouth 2 (two) times daily. 12/25/16  Yes Debbe Odea, MD   furosemide (LASIX) 40 MG tablet Take 1 tablet (40 mg total) by mouth 2 (two) times daily. To be adjusted at SNF based on daily weights and renal function 12/25/16  Yes Debbe Odea, MD  gabapentin (NEURONTIN) 100 MG capsule Take 1 capsule (100 mg total) by mouth 3 (three) times daily. 12/25/16  Yes Debbe Odea, MD  hydrOXYzine (ATARAX/VISTARIL) 25 MG tablet Take 25 mg by mouth 3 (three) times daily as needed for anxiety.   Yes Historical Provider, MD  losartan (COZAAR) 100 MG tablet Take 1 tablet (100 mg total) by mouth daily. 12/26/16  Yes Debbe Odea, MD  metoprolol 75 MG TABS Take 150 mg by mouth 2 (two) times daily. 12/25/16  Yes Debbe Odea, MD  nicotine (NICODERM CQ - DOSED IN MG/24 HOURS) 21 mg/24hr patch Place 21 mg onto the skin daily.   Yes Historical Provider, MD  ondansetron (ZOFRAN-ODT) 4 MG disintegrating tablet Take 1 tablet (4 mg total) by mouth every 6 (six) hours as needed for nausea. 12/25/16  Yes Earnstine Regal, PA-C  polyethylene glycol (MIRALAX / GLYCOLAX) packet Take 17 g by mouth daily. Hold for diarrhea 12/26/16  Yes Debbe Odea, MD  potassium chloride SA (K-DUR,KLOR-CON) 20 MEQ tablet Take 2 tablets (40 mEq total) by mouth 2 (two) times daily. 12/25/16  Yes Debbe Odea, MD  predniSONE (DELTASONE) 20 MG tablet Take 1 tablet (20 mg total) by mouth daily with breakfast. Taper to 10 mg tomorrow and 5 mg the next day Patient not taking: Reported on 12/30/2016 12/26/16   Debbe Odea, MD    Family History Family History  Problem Relation Age of Onset  . Allergies    . COPD Cousin   . Breast cancer Mother   . Diabetes Father   . Diabetes Son     Social History Social History  Substance Use Topics  . Smoking status: Former Smoker    Packs/day: 0.50    Years: 50.00    Types: Cigarettes    Quit date: 11/01/2016  . Smokeless tobacco: Never Used  . Alcohol use No     Allergies   Ciprofloxacin; Flagyl [metronidazole]; and Zosyn [piperacillin sod-tazobactam  so]   Review of Systems Review of Systems  Respiratory: Positive for shortness of breath.   All other systems reviewed and are negative.    Physical Exam Updated Vital Signs BP (!) 143/59 (BP Location: Left Arm)   Pulse 76   Temp 98.2 F (36.8 C) (Oral)   Resp 11   SpO2 95%   Physical Exam  Constitutional: She is oriented to person, place, and time.  Chronically ill appearing, slightly tachypneic, obese   HENT:  Head: Normocephalic.  Mouth/Throat: Oropharynx is clear and moist.  Eyes: EOM are normal. Pupils are equal, round, and reactive to light.  Neck: Normal range of motion. Neck supple.  Cardiovascular: Normal rate, regular rhythm and normal heart sounds.   Pulmonary/Chest:  tachypneic, + wheezing bilaterally, diminished bilateral bases  Abdominal: Soft. Bowel sounds are normal. She exhibits no distension. There is no tenderness.  RLQ drain in place with serosanguinous drainage. Abdomen nontender   Musculoskeletal:  1 +edema bilaterally, no obvious calf tenderness   Neurological: She is alert and oriented to person, place, and time.  Skin: Skin is warm.  Psychiatric: She has a normal mood and affect.  Nursing note and vitals reviewed.    ED Treatments / Results  Labs (all labs ordered are listed, but only abnormal results are displayed) Labs Reviewed  CBC WITH DIFFERENTIAL/PLATELET - Abnormal; Notable for the following:       Result Value   RBC 3.08 (*)    Hemoglobin 9.7 (*)    HCT 30.2 (*)    RDW 15.6 (*)    Neutro Abs 8.8 (*)    Lymphs Abs 0.5 (*)    All other components within normal limits  COMPREHENSIVE METABOLIC PANEL - Abnormal; Notable for the following:    Chloride 95 (*)    CO2 37 (*)    BUN 26 (*)    Calcium 8.5 (*)    Total Protein 5.9 (*)    Albumin 3.1 (*)    AST 14 (*)    All other components within normal limits  BRAIN NATRIURETIC PEPTIDE - Abnormal; Notable for the following:    B Natriuretic Peptide 626.5 (*)    All other  components within normal limits  I-STAT TROPOININ, ED    EKG  EKG Interpretation  Date/Time:  Sunday December 30 2016 11:39:18 EDT Ventricular Rate:  75 PR Interval:    QRS Duration: 94 QT Interval:  377 QTC Calculation: 421 R Axis:   62 Text Interpretation:  Sinus rhythm Probable left atrial enlargement No significant change since last tracing Confirmed by YAO  MD, DAVID ([id number]) on 12/30/2016 12:35:28 PM       Radiology Ct Angio Chest Pe W And/or Wo Contrast  Result Date: 12/30/2016 CLINICAL DATA:  SOB/respiratory distress. Pt received 2 puff pf albuterol at nursing home and had a relief. BP 144/45 P 76, O2 95% on 3 liter pt is on 2 liters at home. Pt have hx of CA. EXAM: CT ANGIOGRAPHY CHEST WITH CONTRAST TECHNIQUE: Multidetector CT imaging of the chest was performed using the standard protocol during bolus administration of intravenous contrast. Multiplanar CT image reconstructions and MIPs were obtained to evaluate the vascular anatomy. CONTRAST:  100 mL of Isovue 370 intravenous contrast COMPARISON:  Current chest radiograph.  Chest CT, 12/08/2016. FINDINGS: Cardiovascular: No evidence of pulmonary embolism. The heart is mildly enlarged. There are mild coronary artery calcifications. The thoracic aorta is normal in caliber. No dissection. Mild atherosclerotic plaque noted along the thoracic aorta and arch branch vessels. Mediastinum/Nodes: There is mediastinal adenopathy. A right peritracheal node in the superior mediastinum measures 15 mm in short axis. A right subcarinal node measures 2 cm short axis. No neck base or axillary masses or adenopathy. Abnormal soft tissue surrounds the right hilar structures. No left hilar mass or discrete enlarged lymph nodes. Lungs/Pleura: The bronchus intermedius is occluded. This may be from endobronchial secretions or focal mass. There is complete atelectasis of the right lower lobe and right middle lobe. 20 x 13 mm nodule in the left upper lobe, centered on  image 27, series 12. No other discrete lung nodules. There is bronchial wall thickening to the left lower lobe with dependent atelectasis. Minimal dependent atelectasis in the left upper lobe. Mild scarring or atelectasis is noted at the anterior  base of the left upper lobe lingula. There is bilateral interstitial thickening and there changes of centrilobular and upper lobe paraseptal emphysema. Small bilateral pleural effusions are noted. There is no pneumothorax. Upper Abdomen: No acute findings. There is relative enlargement of the lateral segment of the left liver lobe with some surface nodularity. This suggests cirrhosis. Musculoskeletal: No fracture or acute finding. No osteoblastic or osteolytic lesions. Review of the MIP images confirms the above findings. IMPRESSION: 1. No evidence of a pulmonary embolism. 2. Mild mediastinal adenopathy.  This may be reactive or metastatic. 3. Occluded bronchus intermedius with atelectasis of the right middle and lower lobes. This may be due to endobronchial secretions or an endobronchial mass. 4. 20 x 13 mm nodule in the left upper lobe. 5. Dependent atelectasis in the left lower lobe and minimally in the left upper lobe. 6. Mild interstitial thickening, small effusions and cardiomegaly. Mild congestive heart failure suspected. 7. Emphysema. Electronically Signed   By: Lajean Manes M.D.   On: 12/30/2016 15:17   Dg Chest Portable 1 View  Result Date: 12/30/2016 CLINICAL DATA:  Shortness of breath since this morning.  Ex-smoker. EXAM: PORTABLE CHEST 1 VIEW COMPARISON:  12/19/2016. FINDINGS: Stable enlarged cardiac silhouette and prominent pulmonary vasculature and interstitial markings. Small to moderate-sized right pleural effusion with mild right basilar atelectasis with little change. Aortic arch calcifications. Unremarkable bones. IMPRESSION: Stable cardiomegaly and changes of congestive heart failure. Aortic atherosclerosis. Electronically Signed   By: Claudie Revering  M.D.   On: 12/30/2016 12:13    Procedures Procedures (including critical care time)  Angiocath insertion Performed by: Wandra Arthurs  Consent: Verbal consent obtained. Risks and benefits: risks, benefits and alternatives were discussed Time out: Immediately prior to procedure a "time out" was called to verify the correct patient, procedure, equipment, support staff and site/side marked as required.  Preparation: Patient was prepped and draped in the usual sterile fashion.  Vein Location: R antecube  Ultrasound Guided  Gauge: 20 long   Normal blood return and flush without difficulty Patient tolerance: Patient tolerated the procedure well with no immediate complications.     Medications Ordered in ED Medications  furosemide (LASIX) injection 40 mg (not administered)  methylPREDNISolone sodium succinate (SOLU-MEDROL) 125 mg/2 mL injection 125 mg (125 mg Intravenous Given 12/30/16 1220)  albuterol (PROVENTIL) (2.5 MG/3ML) 0.083% nebulizer solution 5 mg (5 mg Nebulization Given 12/30/16 1219)  ipratropium (ATROVENT) nebulizer solution 0.5 mg (0.5 mg Nebulization Given 12/30/16 1220)  iopamidol (ISOVUE-370) 76 % injection (100 mLs Intravenous Contrast Given 12/30/16 1447)     Initial Impression / Assessment and Plan / ED Course  I have reviewed the triage vital signs and the nursing notes.  Pertinent labs & imaging results that were available during my care of the patient were reviewed by me and considered in my medical decision making (see chart for details).     Alisha Fisher is a 71 y.o. female here with SOB. Likely multifactorial- consider worsening COPD vs CHF vs PE. Will get labs, BNP, CXR. Will do CT angio to r/o PE. Will give steroids, albuterol.   3:50 PM WBC nl. Chemistry showed CO2 37, baseline. BNP 600. CXR showed pulmonary edema. CT angio showed COPD, known mass with no PE. Given steroids, lasix. Given recent complicated admission for ruptured appy with respiratory failure  requiring bipap, will admit for observation for COPD, CHF exacerbation.   Final Clinical Impressions(s) / ED Diagnoses   Final diagnoses:  None    New  Prescriptions New Prescriptions   No medications on file     Drenda Freeze, MD 12/30/16 1551

## 2016-12-30 NOTE — ED Triage Notes (Signed)
Pt from [facility] for rehab. Pt was send here today for SOB/respiratory distress. Pt received 2 puff pf albuterol at nursing home and had a relief. BP 144/45 P 76, O2 95% on 3 liter pt is on 2 liters at home. Pt have hx of CA.

## 2016-12-30 NOTE — H&P (Signed)
History and Physical    Alisha Fisher WGN:[id number] DOB: January 28, 1946 DOA: 12/30/2016  Referring MD/NP/PA: EDP PCP: Pcp Not In System  Outpatient Specialists: Patient coming from: [facility] facility  Chief Complaint: shortness of breath  HPI: Alisha Fisher is a 71 y.o. female with medical history significant for but not limited to COPD on home oxygen, right lung small cell cancer, and chronic diastolic CHF.   She recently underwent complicated hospitalization from 12/03/2016 to 12/18/2016 for ruptured appendix with abscess status post percutaneous drainage by IR, complicated by acute on chronic respiratory failure due to COPD with fluid overload.She is currently undergoing radiation treatment for her lung cancer.  She presented to [facility] ED today with one-day history of worsening shortness of breath without fever or chills. She continues to cough productive of thick yellowish sputum. No history of significant O2 saturation decrement on oxygen 2 L per minute by nasal. She was treated with albuterol MDI at the nursing home with improvement in her symptoms, but due to concerns for possible PE with continued immobilization at the nursing home she was transferred to the ED for further evaluation.      ED Course: at the ED patient was hemodynamically stable with heart rate of 76/m, temp 98.2 FO2 saturation of 95% on oxygen by nasal cannula 2 L/m. X-ray chest was positive for increasing vascular congestion without pneumonic infiltrate, B-type natriuretic peptide was elevated a> 600. CT angiogram was negative for PE. She is admitted for CHF with respiratory insufficiency Review of Systems: As per HPI otherwise 10 point review of systems negative.    Past Medical History:  Diagnosis Date  . COPD, severe ([place])   . HTN (hypertension)   . Pneumonia   . Pulmonary emphysema ([place])   . SOB (shortness of breath)     Past Surgical History:  Procedure Laterality Date  . CATARACT EXTRACTION     . ENDOBRONCHIAL ULTRASOUND Bilateral 12/17/2016   Procedure: ENDOBRONCHIAL ULTRASOUND;  Surgeon: Javier Glazier, MD;  Location: [facility] ENDOSCOPY;  Service: Cardiopulmonary;  Laterality: Bilateral;  . IR GENERIC HISTORICAL  12/21/2016   IR SINUS/FIST TUBE CHK-NON GI 12/21/2016 [facility]INTER[facility]  . IR GENERIC HISTORICAL  12/21/2016   IR US GUIDE BX ASP/DRAIN 12/21/2016 Corrie Mckusick, DO [facility]INTER[facility]     reports that she quit smoking about 8 weeks ago. Her smoking use included Cigarettes. She has a 25.00 pack-year smoking history. She has never used smokeless tobacco. She reports that she does not drink alcohol or use drugs.  Allergies  Allergen Reactions  . Ciprofloxacin Rash    Rash possibly caused by Cipro?  . Flagyl [Metronidazole] Rash    Rash possibly caused by Flagyl?  Marland Kitchen Zosyn [Piperacillin Sod-Tazobactam So] Itching and Rash    Has patient had a PCN reaction causing immediate rash, facial/tongue/throat swelling, SOB or lightheadedness with hypotension: No Has patient had a PCN reaction causing severe rash involving mucus membranes or skin necrosis: No Has patient had a PCN reaction that required hospitalization: No Has patient had a PCN reaction occurring within the last 10 years: Yes If all of the above answers are "NO", then may proceed with Cephalosporin use.     Family History  Problem Relation Age of Onset  . Allergies    . COPD Cousin   . Breast cancer Mother   . Diabetes Father   . Diabetes Son      Prior to Admission medications   Medication Sig Start Date End Date Taking? Authorizing Provider  acetaminophen (TYLENOL) 325 MG tablet Take 2 tablets (650 mg total) by mouth every 6 (six) hours as needed for mild pain (breakthrough pain). 12/25/16  Yes Earnstine Regal, PA-C  albuterol (PROVENTIL HFA;VENTOLIN HFA) 108 (90 Base) MCG/ACT inhaler Inhale 2 puffs into the lungs every 6 (six) hours as needed for wheezing or shortness of breath.   Yes Historical Provider, MD    arformoterol (BROVANA) 15 MCG/2ML NEBU Take 2 mLs (15 mcg total) by nebulization every 12 (twelve) hours. 12/25/16  Yes Debbe Odea, MD  budesonide (PULMICORT) 0.5 MG/2ML nebulizer solution Take 2 mLs (0.5 mg total) by nebulization every 12 (twelve) hours. 12/25/16  Yes Debbe Odea, MD  Cholecalciferol (VITAMIN D PO) Take 5,000 Units by mouth daily.    Yes Historical Provider, MD  escitalopram (LEXAPRO) 10 MG tablet Take 10 mg by mouth daily.   Yes Historical Provider, MD  famotidine (PEPCID) 40 MG tablet Take 1 tablet (40 mg total) by mouth 2 (two) times daily. 12/25/16  Yes Debbe Odea, MD  furosemide (LASIX) 40 MG tablet Take 1 tablet (40 mg total) by mouth 2 (two) times daily. To be adjusted at SNF based on daily weights and renal function 12/25/16  Yes Debbe Odea, MD  gabapentin (NEURONTIN) 100 MG capsule Take 1 capsule (100 mg total) by mouth 3 (three) times daily. 12/25/16  Yes Debbe Odea, MD  hydrOXYzine (ATARAX/VISTARIL) 25 MG tablet Take 25 mg by mouth 3 (three) times daily as needed for anxiety.   Yes Historical Provider, MD  losartan (COZAAR) 100 MG tablet Take 1 tablet (100 mg total) by mouth daily. 12/26/16  Yes Debbe Odea, MD  metoprolol 75 MG TABS Take 150 mg by mouth 2 (two) times daily. 12/25/16  Yes Debbe Odea, MD  nicotine (NICODERM CQ - DOSED IN MG/24 HOURS) 21 mg/24hr patch Place 21 mg onto the skin daily.   Yes Historical Provider, MD  ondansetron (ZOFRAN-ODT) 4 MG disintegrating tablet Take 1 tablet (4 mg total) by mouth every 6 (six) hours as needed for nausea. 12/25/16  Yes Earnstine Regal, PA-C  polyethylene glycol (MIRALAX / GLYCOLAX) packet Take 17 g by mouth daily. Hold for diarrhea 12/26/16  Yes Debbe Odea, MD  potassium chloride SA (K-DUR,KLOR-CON) 20 MEQ tablet Take 2 tablets (40 mEq total) by mouth 2 (two) times daily. 12/25/16  Yes Debbe Odea, MD  predniSONE (DELTASONE) 20 MG tablet Take 1 tablet (20 mg total) by mouth daily with breakfast. Taper to 10 mg  tomorrow and 5 mg the next day Patient not taking: Reported on 12/30/2016 12/26/16   Debbe Odea, MD    Physical Exam: Vitals:   12/30/16 1134  BP: (!) 143/59  Pulse: 76  Resp: 11  Temp: 98.2 F (36.8 C)  TempSrc: Oral  SpO2: 95%      Constitutional: NAD, calm, comfortable Vitals:   12/30/16 1134  BP: (!) 143/59  Pulse: 76  Resp: 11  Temp: 98.2 F (36.8 C)  TempSrc: Oral  SpO2: 95%   Eyes: PERRL, lids and conjunctivae normal ENMT: Mucous membranes are moist. Posterior pharynx clear of any exudate or lesions.Normal dentition.  Neck: normal, supple, no masses, no thyromegaly Respiratory: diminished breath sounds with occasional bilateral wheezing, no crackles. Normal respiratory effort. No accessory muscle use.  Cardiovascular: Regular rate and rhythm, no murmurs / rubs / gallops. No extremity edema. 2+ pedal pulses. No carotid bruits.  Abdomen: right lower quadrant drain in situ,no tenderness, no masses palpated. No hepatosplenomegaly. Bowel sounds positive.  Musculoskeletal: no  clubbing / cyanosis. No joint deformity upper and lower extremities. Good ROM, no contractures. Normal muscle tone.  Skin: no rashes, lesions, ulcers. No induration. 1+ bipedal pitting edema Neurologic: alert oriented 3. DTR normal. Strength 5/5 in all 4.  Psychiatric: Normal judgment and insight. Alert and oriented x 3. Normal mood.     Labs on Admission: I have personally reviewed following labs and imaging studies  CBC:  Recent Labs Lab 12/24/16 0354 12/30/16 1209  WBC 7.5 10.5  NEUTROABS 5.4 8.8*  HGB 9.9* 9.7*  HCT 29.9* 30.2*  MCV 93.1 98.1  PLT 203 664   Basic Metabolic Panel:  Recent Labs Lab 12/24/16 0354 12/25/16 0420 12/26/16 0445 12/30/16 1209  NA 135  --  133* 138  K 3.4*  --  4.0 4.6  CL 80*  --  86* 95*  CO2 47*  --  40* 37*  GLUCOSE 80  --  82 96  BUN 36*  --  31* 26*  CREATININE 0.90 0.90 0.89 0.76  CALCIUM 8.5*  --  8.5* 8.5*  MG 1.6*  --   --   --     GFR: Estimated Creatinine Clearance: 85.1 mL/min (by C-G formula based on SCr of 0.76 mg/dL). Liver Function Tests:  Recent Labs Lab 12/30/16 1209  AST 14*  ALT 14  ALKPHOS 55  BILITOT 0.6  PROT 5.9*  ALBUMIN 3.1*   No results for input(s): LIPASE, AMYLASE in the last 168 hours. No results for input(s): AMMONIA in the last 168 hours. Coagulation Profile: No results for input(s): INR, PROTIME in the last 168 hours. Cardiac Enzymes: No results for input(s): CKTOTAL, CKMB, CKMBINDEX, TROPONINI in the last 168 hours. BNP (last 3 results) No results for input(s): PROBNP in the last 8760 hours. HbA1C: No results for input(s): HGBA1C in the last 72 hours. CBG: No results for input(s): GLUCAP in the last 168 hours. Lipid Profile: No results for input(s): CHOL, HDL, LDLCALC, TRIG, CHOLHDL, LDLDIRECT in the last 72 hours. Thyroid Function Tests: No results for input(s): TSH, T4TOTAL, FREET4, T3FREE, THYROIDAB in the last 72 hours. Anemia Panel: No results for input(s): VITAMINB12, FOLATE, FERRITIN, TIBC, IRON, RETICCTPCT in the last 72 hours. Urine analysis:    Component Value Date/Time   COLORURINE YELLOW 12/04/2016 0425   APPEARANCEUR HAZY (A) 12/04/2016 0425   LABSPEC >1.046 (H) 12/04/2016 0425   PHURINE 5.0 12/04/2016 0425   GLUCOSEU NEGATIVE 12/04/2016 0425   HGBUR NEGATIVE 12/04/2016 0425   BILIRUBINUR NEGATIVE 12/04/2016 0425   KETONESUR NEGATIVE 12/04/2016 0425   PROTEINUR NEGATIVE 12/04/2016 0425   NITRITE NEGATIVE 12/04/2016 0425   LEUKOCYTESUR NEGATIVE 12/04/2016 0425   Sepsis Labs: '@LABRCNTIP'$ (procalcitonin:4,lacticidven:4) )No results found for this or any previous visit (from the past 240 hour(s)).   Radiological Exams on Admission: Ct Angio Chest Pe W And/or Wo Contrast  Result Date: 12/30/2016 CLINICAL DATA:  SOB/respiratory distress. Pt received 2 puff pf albuterol at nursing home and had a relief. BP 144/45 P 76, O2 95% on 3 liter pt is on 2 liters at  home. Pt have hx of CA. EXAM: CT ANGIOGRAPHY CHEST WITH CONTRAST TECHNIQUE: Multidetector CT imaging of the chest was performed using the standard protocol during bolus administration of intravenous contrast. Multiplanar CT image reconstructions and MIPs were obtained to evaluate the vascular anatomy. CONTRAST:  100 mL of Isovue 370 intravenous contrast COMPARISON:  Current chest radiograph.  Chest CT, 12/08/2016. FINDINGS: Cardiovascular: No evidence of pulmonary embolism. The heart is mildly enlarged. There are  mild coronary artery calcifications. The thoracic aorta is normal in caliber. No dissection. Mild atherosclerotic plaque noted along the thoracic aorta and arch branch vessels. Mediastinum/Nodes: There is mediastinal adenopathy. A right peritracheal node in the superior mediastinum measures 15 mm in short axis. A right subcarinal node measures 2 cm short axis. No neck base or axillary masses or adenopathy. Abnormal soft tissue surrounds the right hilar structures. No left hilar mass or discrete enlarged lymph nodes. Lungs/Pleura: The bronchus intermedius is occluded. This may be from endobronchial secretions or focal mass. There is complete atelectasis of the right lower lobe and right middle lobe. 20 x 13 mm nodule in the left upper lobe, centered on image 27, series 12. No other discrete lung nodules. There is bronchial wall thickening to the left lower lobe with dependent atelectasis. Minimal dependent atelectasis in the left upper lobe. Mild scarring or atelectasis is noted at the anterior base of the left upper lobe lingula. There is bilateral interstitial thickening and there changes of centrilobular and upper lobe paraseptal emphysema. Small bilateral pleural effusions are noted. There is no pneumothorax. Upper Abdomen: No acute findings. There is relative enlargement of the lateral segment of the left liver lobe with some surface nodularity. This suggests cirrhosis. Musculoskeletal: No fracture or  acute finding. No osteoblastic or osteolytic lesions. Review of the MIP images confirms the above findings. IMPRESSION: 1. No evidence of a pulmonary embolism. 2. Mild mediastinal adenopathy.  This may be reactive or metastatic. 3. Occluded bronchus intermedius with atelectasis of the right middle and lower lobes. This may be due to endobronchial secretions or an endobronchial mass. 4. 20 x 13 mm nodule in the left upper lobe. 5. Dependent atelectasis in the left lower lobe and minimally in the left upper lobe. 6. Mild interstitial thickening, small effusions and cardiomegaly. Mild congestive heart failure suspected. 7. Emphysema. Electronically Signed   By: Lajean Manes M.D.   On: 12/30/2016 15:17   Dg Chest Portable 1 View  Result Date: 12/30/2016 CLINICAL DATA:  Shortness of breath since this morning.  Ex-smoker. EXAM: PORTABLE CHEST 1 VIEW COMPARISON:  12/19/2016. FINDINGS: Stable enlarged cardiac silhouette and prominent pulmonary vasculature and interstitial markings. Small to moderate-sized right pleural effusion with mild right basilar atelectasis with little change. Aortic arch calcifications. Unremarkable bones. IMPRESSION: Stable cardiomegaly and changes of congestive heart failure. Aortic atherosclerosis. Electronically Signed   By: Claudie Revering M.D.   On: 12/30/2016 12:13    EKG: Independently reviewed.   Assessment/Plan Principal Problem:   Respiratory insufficiency Active Problems:   COPD (chronic obstructive pulmonary disease) (HCC)   Squamous cell lung cancer, right (HCC)   CHF (congestive heart failure) (HCC)  #1 respiratory insufficiency: Due to CHF/fluid overload with pre-existing COPD/ possible sleep apnea Oxygen supplementation Treatment for CHF and COPD BiPAP as needed  #2 CHF: 2-D echocardiogram 12/22/2016 -EF 66-59%, grade 2 diastolic dysfunction. Gentle diuresis with monitoring of electrolytes and renal function Cycle cardiac enzymes  #3 COPD: Continue pulmonary  toilet measures Supportive care No antibiotic for now  #4 lung cancer: Continue radiation treatment Supportive care  DVT prophylaxis:  (Lovenox) Code Status: (Full) Family Communication: sister [name], at bedside Disposition Plan: [facility] Consults called:  Admission status: ( obs / tele)   OSEI-BONSU,Laurana Magistro MD [facility] Pager [phone]  If 7PM-7AM, please contact night-coverage [email or web address] Password [facility]  12/30/2016, 4:24 PM

## 2016-12-30 NOTE — ED Notes (Signed)
Bed: CJ67 Expected date: 12/30/16 Expected time: 11:21 AM Means of arrival:  Comments: [facility] resp difficult

## 2016-12-31 ENCOUNTER — Ambulatory Visit
Admit: 2016-12-31 | Discharge: 2016-12-31 | Disposition: A | Discharge status: Home / Self Care | Payer: Medicare Other | Attending: Radiation Oncology | Admitting: Radiation Oncology

## 2016-12-31 ENCOUNTER — Other Ambulatory Visit: Payer: Self-pay

## 2016-12-31 ENCOUNTER — Telehealth: Payer: Self-pay | Admitting: *Deleted

## 2016-12-31 ENCOUNTER — Observation Stay (HOSPITAL_COMMUNITY): Payer: Medicare Other

## 2016-12-31 DIAGNOSIS — C3491 Malignant neoplasm of unspecified part of right bronchus or lung: Secondary | ICD-10-CM

## 2016-12-31 DIAGNOSIS — I5033 Acute on chronic diastolic (congestive) heart failure: Secondary | ICD-10-CM | POA: Diagnosis present

## 2016-12-31 DIAGNOSIS — Z87891 Personal history of nicotine dependence: Secondary | ICD-10-CM | POA: Diagnosis not present

## 2016-12-31 DIAGNOSIS — Z7951 Long term (current) use of inhaled steroids: Secondary | ICD-10-CM | POA: Diagnosis not present

## 2016-12-31 DIAGNOSIS — I110 Hypertensive heart disease with heart failure: Secondary | ICD-10-CM | POA: Diagnosis present

## 2016-12-31 DIAGNOSIS — Z9981 Dependence on supplemental oxygen: Secondary | ICD-10-CM | POA: Diagnosis not present

## 2016-12-31 DIAGNOSIS — K352 Acute appendicitis with generalized peritonitis: Secondary | ICD-10-CM | POA: Diagnosis present

## 2016-12-31 DIAGNOSIS — R0689 Other abnormalities of breathing: Secondary | ICD-10-CM | POA: Diagnosis not present

## 2016-12-31 DIAGNOSIS — J441 Chronic obstructive pulmonary disease with (acute) exacerbation: Secondary | ICD-10-CM

## 2016-12-31 DIAGNOSIS — J449 Chronic obstructive pulmonary disease, unspecified: Secondary | ICD-10-CM | POA: Diagnosis present

## 2016-12-31 DIAGNOSIS — J9621 Acute and chronic respiratory failure with hypoxia: Secondary | ICD-10-CM | POA: Diagnosis present

## 2016-12-31 DIAGNOSIS — I10 Essential (primary) hypertension: Secondary | ICD-10-CM

## 2016-12-31 DIAGNOSIS — Z923 Personal history of irradiation: Secondary | ICD-10-CM | POA: Diagnosis not present

## 2016-12-31 DIAGNOSIS — R0602 Shortness of breath: Secondary | ICD-10-CM | POA: Diagnosis present

## 2016-12-31 LAB — COMPREHENSIVE METABOLIC PANEL
ALBUMIN: 3 g/dL — AB (ref 3.5–5.0)
ALT: 15 U/L (ref 14–54)
AST: 18 U/L (ref 15–41)
Alkaline Phosphatase: 52 U/L (ref 38–126)
Anion gap: 10 (ref 5–15)
BILIRUBIN TOTAL: 0.7 mg/dL (ref 0.3–1.2)
BUN: 31 mg/dL — ABNORMAL HIGH (ref 6–20)
CO2: 36 mmol/L — ABNORMAL HIGH (ref 22–32)
Calcium: 8.8 mg/dL — ABNORMAL LOW (ref 8.9–10.3)
Chloride: 92 mmol/L — ABNORMAL LOW (ref 101–111)
Creatinine, Ser: 1 mg/dL (ref 0.44–1.00)
GFR calc Af Amer: >60 mL/min (ref >60)
GFR calc non Af Amer: 56 mL/min — ABNORMAL LOW (ref >60)
GLUCOSE: 136 mg/dL — AB (ref 65–99)
POTASSIUM: 4.6 mmol/L (ref 3.5–5.1)
Sodium: 138 mmol/L (ref 135–145)
TOTAL PROTEIN: 6.1 g/dL — AB (ref 6.5–8.1)

## 2016-12-31 LAB — BRAIN NATRIURETIC PEPTIDE: B Natriuretic Peptide: 1330.1 pg/mL — ABNORMAL HIGH (ref 0.0–100.0)

## 2016-12-31 LAB — TROPONIN I
Troponin I: <0.03 ng/mL (ref <0.03)
Troponin I: <0.03 ng/mL (ref <0.03)

## 2016-12-31 MED ORDER — FUROSEMIDE 10 MG/ML IJ SOLN
40.0000 mg | Freq: Every day | INTRAMUSCULAR | Status: DC
  Administered 2017-01-01: 40 mg via INTRAVENOUS
  Filled 2016-12-31: qty 4

## 2016-12-31 MED ORDER — HYDRALAZINE HCL 20 MG/ML IJ SOLN: 10.0000 mg | INTRAMUSCULAR | Status: DC | PRN

## 2016-12-31 MED ORDER — DIPHENHYDRAMINE HCL 50 MG PO CAPS
50.0000 mg | ORAL_CAPSULE | Freq: Once | ORAL | Status: AC
  Administered 2016-12-31: 50 mg via ORAL
  Filled 2016-12-31: qty 1

## 2016-12-31 MED ORDER — ENOXAPARIN SODIUM 60 MG/0.6ML ~~LOC~~ SOLN
50.0000 mg | SUBCUTANEOUS | Status: DC
  Administered 2016-12-31: 50 mg via SUBCUTANEOUS
  Filled 2016-12-31: qty 0.6

## 2016-12-31 MED ORDER — DIPHENHYDRAMINE HCL 25 MG PO CAPS
25.0000 mg | ORAL_CAPSULE | ORAL | Status: DC | PRN
  Administered 2016-12-31 – 2017-01-01 (×4): 25 mg via ORAL
  Filled 2016-12-31 (×5): qty 1

## 2016-12-31 NOTE — Telephone Encounter (Signed)
Called the floor spoke with RN Denton Ar, patient is good to have radiation therapy today no IVF's, is on Oxygen, thanked RN, will let [facility] #2 know 10:36 AM

## 2016-12-31 NOTE — Care Management Obs Status (Signed)
[place] NOTIFICATION   Patient Details  Name: Alisha Fisher MRN: [id number] Date of Birth: [birth year]   Medicare Observation Status Notification Given:  Yes    MahabirJuliann Pulse, RN 12/31/2016, 1:32 PM

## 2016-12-31 NOTE — Care Management Note (Signed)
Case Management Note  Patient Details  Name: Alisha Fisher MRN: [id number] Date of Birth: 14-Dec-1945  Subjective/Objective:  71 y/o f admitted w/Respiratory Insuffuciency. From SNF[facility].CSW following for return.                  Action/Plan:dc SNF.   Expected Discharge Date:  01/02/17               Expected Discharge Plan:  Skilled Nursing Facility  In-House Referral:  Clinical Social Work  Discharge planning Services  CM Consult  Post Acute Care Choice:    Choice offered to:     DME Arranged:    DME Agency:     HH Arranged:    [facility] Agency:     Status of Service:  In process, will continue to follow  If discussed at Long Length of Stay Meetings, dates discussed:    Additional Comments:  Dessa Phi, RN 12/31/2016, 1:32 PM

## 2016-12-31 NOTE — Evaluation (Signed)
Occupational Therapy Evaluation Patient Details Name: Alisha Fisher MRN: [id number] DOB: July 02, 1946 Today's Date: 12/31/2016    History of Present Illness Pt presents with increased SOB; PMHx includes HTN, former smoker, COPD, lung cancer, chronic diastolic CHF   Clinical Impression   Pt is a 71 y/o F who presents for the above. Pt currently on 3L continuous O2; increased O2 to 4L continuous during session (Lowest stat at 88% 3L O2 after supine to sitting EOB transfer, and stats 93% 3L O2 at end of session). Pt will benefit from continued OT services to increase safety and independence during ADLs and functional mobility. Goals have been set for [facility] to [facility].     Follow Up Recommendations  SNF    Equipment Recommendations  None recommended by OT (Needed equipment available at SNF)    Recommendations for Other Services       Precautions / Restrictions Precautions Precautions: Fall Precaution Comments: R LQ drain, 3L continuous O2 (increased to 4L continuous during functional mobility and ADL tasks) Restrictions Weight Bearing Restrictions: No      Mobility Bed Mobility Overal bed mobility: Needs Assistance Bed Mobility: Supine to Sit     Supine to sit: Min guard     General bed mobility comments: Pt left OOB in recliner with chair alarm activated   Transfers Overall transfer level: Needs assistance   Transfers: Sit to/from Stand;Stand Pivot Transfers Sit to Stand: Min guard Stand pivot transfers: Min Assist       General transfer comment: Verbal cues for hand placement during sit to stand transfers; used hand held assist for stand pivot transfer    Balance Overall balance assessment: Needs assistance Sitting-balance support: Feet supported;Bilateral upper extremity supported Sitting balance-Leahy Scale: Good                                     ADL either performed or assessed with clinical judgement   ADL Overall ADL's : Needs  assistance/impaired Eating/Feeding: Independent   Grooming: Oral care;Standing;Minimal assistance Grooming Details (indicate cue type and reason): Stood from chair at sink to brush teeth, using BUE for added support during task completion Upper Body Bathing: Sitting;Min Guard   Lower Body Bathing: Moderate assistance;Sit to/from stand   Upper Body Dressing : Min guard;Sitting   Lower Body Dressing: Maximum assistance;Sit to/from stand   Toilet Transfer: Stand-pivot;BSC;Min Assist   Toileting- Water quality scientist and Hygiene: Minimal assistance;Sit to/from stand       Functional mobility during ADLs: Min Assist;Cueing for safety General ADL Comments: For stand pivot transfers and sit to stand transfers, verbal cues for hand placement; educated Pt in pursed lip breathing techniques during ADL task completion.                   Pertinent Vitals/Pain Pain Assessment: No/denies pain        Extremity/Trunk Assessment Upper Extremity Assessment Upper Extremity Assessment: Overall WFL for tasks assessed           Communication Communication Communication: No difficulties   Cognition Arousal/Alertness: Awake/alert Behavior During Therapy: WFL for tasks assessed/performed Overall Cognitive Status: Within Functional Limits for tasks assessed                                                Home Living  Family/patient expects to be discharged to:: Skilled nursing facility   Available Help at Discharge: [place]                                    Prior Functioning/Environment Level of Independence: Needs assistance                 OT Problem List: Decreased strength;Decreased activity tolerance;Cardiopulmonary status limiting activity      OT Treatment/Interventions: Self-care/ADL training;Therapeutic exercise;Energy conservation;Therapeutic activities;Patient/family education    OT Goals(Current goals can be  found in the care plan section) Acute Rehab OT Goals Patient Stated Goal: To be more independent. OT Goal Formulation: With patient Time For Goal Achievement: 01/07/17 Potential to Achieve Goals: Good ADL Goals Pt Will Perform Grooming: with supervision;sitting Pt Will Perform Lower Body Dressing: with mod assist; Min guard for sit to/from stand Pt Will Perform Toileting - Clothing Manipulation and hygiene: with min guard assist;sit to/from stand Additional ADL Goal #1: Pt will tolerate at least 5 min ADL self care activity while demonstrating at least 1 energy conservation technique.   OT Frequency: Min 2X/week              End of Session Equipment Utilized During Treatment: Oxygen (3L continuous, increased to 4L during functional mobility and ADL tasks, returned to 3L continuous at end of session) Nurse Communication: Mobility status (nurse tech)  Activity Tolerance: Patient tolerated treatment well Patient left: in chair;with chair alarm set;with call bell/phone within reach  OT Visit Diagnosis: Muscle weakness (generalized) (M62.81)                Time: 4008-6761 OT Time Calculation (min): 30 min Charges:  OT General Charges $OT Visit: 1 Procedure OT Evaluation $OT Eval Moderate Complexity: 1 Procedure OT Treatments $Self Care/Home Management : 8-22 mins G-Codes: OT G-codes **NOT FOR INPATIENT CLASS** Functional Assessment Tool Used: AM-PAC 6 Clicks Daily Activity;Clinical judgement Functional Limitation: Self care Self Care Current Status (P5093): At least 40 percent but less than 60 percent impaired, limited or restricted Self Care Goal Status (O6712): At least 20 percent but less than 40 percent impaired, limited or restricted   Alisha Fisher, OT Pager [phone] 12/31/2016  Alisha Fisher 12/31/2016, 9:50 AM

## 2016-12-31 NOTE — Progress Notes (Signed)
PROGRESS NOTE    Alisha Fisher  WVP:[id number] DOB: 1946/07/01 DOA: 12/30/2016 PCP: Pcp Not In System    Brief Narrative:  71 yo female presented with the chief complain of worsening dyspnea. Patient with copd and chronic hypoxic respiratory failure. Worsening symptoms for the last 24 hours. On the initial evaluation found hypervolemic and started on diuresis. CT chest negative for PE.    Assessment & Plan:   Principal Problem:   Respiratory insufficiency Active Problems:   COPD (chronic obstructive pulmonary disease) (HCC)   Squamous cell lung cancer, right (HCC)   CHF (congestive heart failure) ([place])   1. Acute on chronic hypoxic respiratory failure, due to decompensated diastolic heart failure. Chest film personally reviewed noted bilateral interstitial infiltrates, will continue diuresis with furosemide, will follow on urine output and clinical response.   2. COPD.  Will continue bronchodilator therapy with duonebs, will continue oxymetry monitoring and supplemental 02 per [place] to target 02 saturation above 92%. Continue brovana. Prednisone taper.   3.  Lung cancer. Patient on radiation therapy.  4. HTN. Will continue losartan, metoprolol and as needed hydralazine.    DVT prophylaxis: enoxaparin  Code Status: full  Family Communication: No family at the bedside  Disposition Plan: SNF    Consultants:      Procedures:  Antimicrobials:   Subjective: Patient with persistent dyspnea, mild improvement with diuresis, no chest pain. Positive cough. Patient complains of pain at the abdominal drain site.   Objective: Vitals:   12/30/16 2105 12/30/16 2138 12/31/16 0435 12/31/16 0436  BP: (!) 185/79   (!) 164/67  Pulse: 69   69  Resp: 14   18  Temp: 97.6 F (36.4 C)   99.6 F (37.6 C)  TempSrc: Oral   Oral  SpO2: 96% 95%  97%  Weight:   108.1 kg (238 lb 5.1 oz)   Height:        Intake/Output Summary (Last 24 hours) at 12/31/16 0938 Last data filed at 12/31/16  0830  Gross per 24 hour  Intake              240 ml  Output             1000 ml  Net             -760 ml   Filed Weights   12/30/16 1859 12/31/16 0435  Weight: 110.9 kg (244 lb 7.8 oz) 108.1 kg (238 lb 5.1 oz)    Examination:  General exam: deconditioned E ENT: positive pallor, no icterus, oral mucosa moist.  Respiratory system: decreased breath sounds with positive scattered rales and rhonchi bilaterally. Respiratory effort normal. Cardiovascular system: S1 & S2 heard, RRR. No JVD, murmurs, rubs, gallops or clicks. No pedal edema. Gastrointestinal system: Abdomen is nondistended, soft and nontender. No organomegaly or masses felt. Normal bowel sounds heard. Central nervous system: Alert and oriented. No focal neurological deficits. Extremities: Symmetric 5 x 5 power. Skin: No rashes, lesions or ulcers     Data Reviewed: I have personally reviewed following labs and imaging studies  CBC:  Recent Labs Lab 12/30/16 1209  WBC 10.5  NEUTROABS 8.8*  HGB 9.7*  HCT 30.2*  MCV 98.1  PLT 546   Basic Metabolic Panel:  Recent Labs Lab 12/25/16 0420 12/26/16 0445 12/30/16 1209 12/30/16 1913 12/31/16 0103  NA  --  133* 138  --  138  K  --  4.0 4.6  --  4.6  CL  --  86* 95*  --  92*  CO2  --  40* 37*  --  36*  GLUCOSE  --  82 96  --  136*  BUN  --  31* 26*  --  31*  CREATININE 0.90 0.89 0.76  --  1.00  CALCIUM  --  8.5* 8.5*  --  8.8*  MG  --   --   --  1.8  --    GFR: Estimated Creatinine Clearance: 67.2 mL/min (by C-G formula based on SCr of 1 mg/dL). Liver Function Tests:  Recent Labs Lab 12/30/16 1209 12/31/16 0103  AST 14* 18  ALT 14 15  ALKPHOS 55 52  BILITOT 0.6 0.7  PROT 5.9* 6.1*  ALBUMIN 3.1* 3.0*   No results for input(s): LIPASE, AMYLASE in the last 168 hours. No results for input(s): AMMONIA in the last 168 hours. Coagulation Profile: No results for input(s): INR, PROTIME in the last 168 hours. Cardiac Enzymes:  Recent Labs Lab  12/30/16 1913 12/31/16 0103 12/31/16 0712  TROPONINI <0.03 <0.03 <0.03   BNP (last 3 results) No results for input(s): PROBNP in the last 8760 hours. HbA1C: No results for input(s): HGBA1C in the last 72 hours. CBG: No results for input(s): GLUCAP in the last 168 hours. Lipid Profile: No results for input(s): CHOL, HDL, LDLCALC, TRIG, CHOLHDL, LDLDIRECT in the last 72 hours. Thyroid Function Tests:  Recent Labs  12/30/16 1913  TSH 2.356   Anemia Panel: No results for input(s): VITAMINB12, FOLATE, FERRITIN, TIBC, IRON, RETICCTPCT in the last 72 hours. Sepsis Labs: No results for input(s): PROCALCITON, LATICACIDVEN in the last 168 hours.  No results found for this or any previous visit (from the past 240 hour(s)).       Radiology Studies: Ct Angio Chest Pe W And/or Wo Contrast  Result Date: 12/30/2016 CLINICAL DATA:  SOB/respiratory distress. Pt received 2 puff pf albuterol at nursing home and had a relief. BP 144/45 P 76, O2 95% on 3 liter pt is on 2 liters at home. Pt have hx of CA. EXAM: CT ANGIOGRAPHY CHEST WITH CONTRAST TECHNIQUE: Multidetector CT imaging of the chest was performed using the standard protocol during bolus administration of intravenous contrast. Multiplanar CT image reconstructions and MIPs were obtained to evaluate the vascular anatomy. CONTRAST:  100 mL of Isovue 370 intravenous contrast COMPARISON:  Current chest radiograph.  Chest CT, 12/08/2016. FINDINGS: Cardiovascular: No evidence of pulmonary embolism. The heart is mildly enlarged. There are mild coronary artery calcifications. The thoracic aorta is normal in caliber. No dissection. Mild atherosclerotic plaque noted along the thoracic aorta and arch branch vessels. Mediastinum/Nodes: There is mediastinal adenopathy. A right peritracheal node in the superior mediastinum measures 15 mm in short axis. A right subcarinal node measures 2 cm short axis. No neck base or axillary masses or adenopathy. Abnormal  soft tissue surrounds the right hilar structures. No left hilar mass or discrete enlarged lymph nodes. Lungs/Pleura: The bronchus intermedius is occluded. This may be from endobronchial secretions or focal mass. There is complete atelectasis of the right lower lobe and right middle lobe. 20 x 13 mm nodule in the left upper lobe, centered on image 27, series 12. No other discrete lung nodules. There is bronchial wall thickening to the left lower lobe with dependent atelectasis. Minimal dependent atelectasis in the left upper lobe. Mild scarring or atelectasis is noted at the anterior base of the left upper lobe lingula. There is bilateral interstitial thickening and there changes of centrilobular and upper lobe paraseptal emphysema. Small bilateral  pleural effusions are noted. There is no pneumothorax. Upper Abdomen: No acute findings. There is relative enlargement of the lateral segment of the left liver lobe with some surface nodularity. This suggests cirrhosis. Musculoskeletal: No fracture or acute finding. No osteoblastic or osteolytic lesions. Review of the MIP images confirms the above findings. IMPRESSION: 1. No evidence of a pulmonary embolism. 2. Mild mediastinal adenopathy.  This may be reactive or metastatic. 3. Occluded bronchus intermedius with atelectasis of the right middle and lower lobes. This may be due to endobronchial secretions or an endobronchial mass. 4. 20 x 13 mm nodule in the left upper lobe. 5. Dependent atelectasis in the left lower lobe and minimally in the left upper lobe. 6. Mild interstitial thickening, small effusions and cardiomegaly. Mild congestive heart failure suspected. 7. Emphysema. Electronically Signed   By: Lajean Manes M.D.   On: 12/30/2016 15:17   Dg Chest Port 1 View  Result Date: 12/31/2016 CLINICAL DATA:  CHF. EXAM: PORTABLE CHEST 1 VIEW COMPARISON:  CT 12/30/2016.  Chest x-ray 12/30/2016. FINDINGS: Cardiomegaly with pulmonary vascular prominence and bilateral  interstitial prominence. Interim partial clearing of bibasilar atelectasis. Bilateral pleural effusions again noted. No pneumothorax. IMPRESSION: 1. Cardiomegaly with pulmonary vascular prominence and bilateral interstitial prominence. Findings consistent with mild CHF. Bilateral pleural effusions again noted. Similar findings noted on prior exams. 2. Interim partial clearing of bibasilar atelectasis. Electronically Signed   By: Marcello Moores  Register   On: 12/31/2016 06:55   Dg Chest Portable 1 View  Result Date: 12/30/2016 CLINICAL DATA:  Shortness of breath since this morning.  Ex-smoker. EXAM: PORTABLE CHEST 1 VIEW COMPARISON:  12/19/2016. FINDINGS: Stable enlarged cardiac silhouette and prominent pulmonary vasculature and interstitial markings. Small to moderate-sized right pleural effusion with mild right basilar atelectasis with little change. Aortic arch calcifications. Unremarkable bones. IMPRESSION: Stable cardiomegaly and changes of congestive heart failure. Aortic atherosclerosis. Electronically Signed   By: Claudie Revering M.D.   On: 12/30/2016 12:13        Scheduled Meds: . arformoterol  15 mcg Nebulization Q12H  . budesonide  0.5 mg Nebulization Q12H  . cholecalciferol  5,000 Units Oral Daily  . enoxaparin (LOVENOX) injection  50 mg Subcutaneous Q24H  . famotidine  40 mg Oral BID  . furosemide  40 mg Intravenous Q12H  . gabapentin  100 mg Oral TID  . losartan  50 mg Oral Daily  . mouth rinse  15 mL Mouth Rinse BID  . metoprolol  150 mg Oral BID  . nicotine  21 mg Transdermal Daily  . polyethylene glycol  17 g Oral Daily  . potassium chloride SA  40 mEq Oral BID  . predniSONE  10 mg Oral Q breakfast  . [START ON 01/01/2017] predniSONE  5 mg Oral Q breakfast  . sodium chloride flush  3 mL Intravenous Q12H   Continuous Infusions:   LOS: 0 days       Mauricio Gerome Apley, MD [facility] Pager [phone]  If 7PM-7AM, please contact  night-coverage [email or web address] Password [facility] 12/31/2016, 9:38 AM

## 2016-12-31 NOTE — Progress Notes (Signed)
CSW contacted [facility] staff confirmed patient's ability to return. CSW will continue to follow and assist with discharge planning.   Abundio Miu, [place] Social Worker [facility] Cell#: [phone]

## 2016-12-31 NOTE — Evaluation (Signed)
Physical Therapy Evaluation Patient Details Name: Alisha Fisher MRN: [id number] DOB: [birth year] Today's Date: 12/31/2016   History of Present Illness  Pt admitted with respiratory insufficiency.  Pt with recent complicated hospitalization from 12/03/2016 to 12/18/2016 for ruptured appendix with abscess status post percutaneous drainage by IR, complicated by acute on chronic respiratory failure due to COPD with fluid overload.  She is currently undergoing radiation treatment for her lung cancer. PMHx includes HTN, former smoker, COPD, lung cancer, chronic diastolic CHF  Clinical Impression  Pt admitted with above diagnosis. Pt currently with functional limitations due to the deficits listed below (see PT Problem List).  Pt will benefit from skilled PT to increase their independence and safety with mobility to allow discharge to the venue listed below.   Pt with recent admission and was discharged to SNF.  She plans to return to SNF for rehab.  Will continue to assist with mobility in acute setting.     Follow Up Recommendations SNF;Supervision/Assistance - 24 hour    Equipment Recommendations  None recommended by PT    Recommendations for Other Services       Precautions / Restrictions Precautions Precautions: Fall Precaution Comments: R LQ drain, 3L continuous O2  Restrictions Weight Bearing Restrictions: No      Mobility  Bed Mobility Overal bed mobility: Needs Assistance Bed Mobility: Sit to Supine     Supine to sit: Min guard Sit to supine: Min guard   General bed mobility comments: verbal cues for technique  Transfers Overall transfer level: Needs assistance Equipment used: Rolling walker (2 wheeled) Transfers: Sit to/from Stand Sit to Stand: Min guard Stand pivot transfers: Min guard       General transfer comment: min/guard for safety, pt reports slight dizziness today which did not become worse with mobility  Ambulation/Gait Ambulation/Gait assistance: Min  guard Ambulation Distance (Feet): 40 Feet Assistive device: Rolling walker (2 wheeled) Gait Pattern/deviations: Step-through pattern;Decreased stride length     General Gait Details: fatigues quickly, one standing rest break required, SpO2 decreased to 87% on 3L O2 [place] however improved with rest break and breathing  Stairs            Wheelchair Mobility    Modified Rankin (Stroke Patients Only)       Balance Overall balance assessment: Needs assistance Sitting-balance support: Feet supported;Bilateral upper extremity supported Sitting balance-Leahy Scale: Good                                       Pertinent Vitals/Pain Pain Assessment: No/denies pain Pain Intervention(s): Repositioned    Home Living Family/patient expects to be discharged to:: Skilled nursing facility Living Arrangements: Children Available Help at Discharge: Available PRN/intermittently;Family Type of Home: House Home Access: Stairs to enter   Technical brewer of Steps: 4 Home Layout: One level Home Equipment: None Additional Comments: per recent admission, discharged to SNF and readmitted    Prior Function Level of Independence: Independent               Hand Dominance        Extremity/Trunk Assessment   Upper Extremity Assessment Upper Extremity Assessment: Overall WFL for tasks assessed    Lower Extremity Assessment Lower Extremity Assessment: Generalized weakness    Cervical / Trunk Assessment Cervical / Trunk Assessment: Normal  Communication   Communication: No difficulties  Cognition Arousal/Alertness: Awake/alert Behavior During Therapy: WFL for tasks assessed/performed Overall Cognitive  Status: Within Functional Limits for tasks assessed                                        General Comments      Exercises     Assessment/Plan    PT Assessment Patient needs continued PT services  PT Problem List Decreased  strength;Decreased activity tolerance;Decreased knowledge of use of DME;Decreased mobility;Cardiopulmonary status limiting activity       PT Treatment Interventions DME instruction;Gait training;Therapeutic exercise;Therapeutic activities;Functional mobility training;Patient/family education    PT Goals (Current goals can be found in the Care Plan section)  Acute Rehab PT Goals Patient Stated Goal: To be more independent. PT Goal Formulation: With patient Time For Goal Achievement: 01/14/17 Potential to Achieve Goals: Good    Frequency Min 3X/week   Barriers to discharge        Co-evaluation               End of Session Equipment Utilized During Treatment: Oxygen Activity Tolerance: Patient limited by fatigue Patient left: in bed;with call bell/phone within reach;with bed alarm set   PT Visit Diagnosis: Difficulty in walking, not elsewhere classified (R26.2)    Time: 6153-7943 PT Time Calculation (min) (ACUTE ONLY): 14 min   Charges:   PT Evaluation $PT Eval Low Complexity: 1 Procedure     PT G Codes:   PT G-Codes **NOT FOR INPATIENT CLASS** Functional Assessment Tool Used: AM-PAC 6 Clicks Basic Mobility;Clinical judgement Functional Limitation: Mobility: Walking and moving around Mobility: Walking and Moving Around Current Status (E7614): At least 40 percent but less than 60 percent impaired, limited or restricted Mobility: Walking and Moving Around Goal Status [phone]): At least 20 percent but less than 40 percent impaired, limited or restricted    [name], PT, DPT 12/31/2016 Pager: [phone]   York Ram E 12/31/2016, 12:05 PM

## 2017-01-01 ENCOUNTER — Ambulatory Visit
Admit: 2017-01-01 | Discharge: 2017-01-01 | Disposition: A | Discharge status: Home / Self Care | Payer: Medicare Other | Attending: Radiation Oncology | Admitting: Radiation Oncology

## 2017-01-01 ENCOUNTER — Other Ambulatory Visit: Payer: Self-pay | Admitting: General Surgery

## 2017-01-01 DIAGNOSIS — K3533 Acute appendicitis with perforation and localized peritonitis, with abscess: Secondary | ICD-10-CM

## 2017-01-01 DIAGNOSIS — I5033 Acute on chronic diastolic (congestive) heart failure: Secondary | ICD-10-CM

## 2017-01-01 LAB — BASIC METABOLIC PANEL
Anion gap: 8 (ref 5–15)
BUN: 31 mg/dL — ABNORMAL HIGH (ref 6–20)
CHLORIDE: 92 mmol/L — AB (ref 101–111)
CO2: 37 mmol/L — ABNORMAL HIGH (ref 22–32)
Calcium: 8.6 mg/dL — ABNORMAL LOW (ref 8.9–10.3)
Creatinine, Ser: 0.9 mg/dL (ref 0.44–1.00)
GFR calc Af Amer: >60 mL/min (ref >60)
GFR calc non Af Amer: >60 mL/min (ref >60)
Glucose, Bld: 73 mg/dL (ref 65–99)
POTASSIUM: 4.5 mmol/L (ref 3.5–5.1)
SODIUM: 137 mmol/L (ref 135–145)

## 2017-01-01 MED ORDER — POTASSIUM CHLORIDE CRYS ER 20 MEQ PO TBCR: 20.0000 meq | EXTENDED_RELEASE_TABLET | Freq: Every day | ORAL | Status: DC

## 2017-01-01 NOTE — Progress Notes (Signed)
Taking over care of patient agree previous RN assessment. Awaiting transport back to [facility]. Will continue to monitor.

## 2017-01-01 NOTE — Progress Notes (Signed)
Patient assessed recently on 12/16/2016 returning to [facility]. PTAR contacted patient's family aware. Patient's RN can call report to [phone], patient going to room 103A. Patient's RN aware, packet complete.   Alisha Fisher, [place] Social Worker [facility] Cell#: [phone]

## 2017-01-01 NOTE — Consult Note (Signed)
Chief Complaint: Patient was seen in consultation today for intra-abdominal abscesses  Referring Physician(s):  Dr. Sander Radon  Supervising Physician: Daryll Brod  Patient Status: Alisha Fisher  History of Present Illness: Alisha Fisher is a 71 y.o. female with past medical history of COPD, right lung cancer, and CHF who was recently admitted from 3/5-3/20/18 with perforated appendix.  She underwent percutaneous drainage x2.  The LLQ fluid collection resolved and drain was able to removed prior to discharge.  The RLQ drain, however formed fistula with appendiceal stump and remained at discharge.   Patient returned to [facility] 4/1 with respiratory distress which has significantly improved.  She is ready for discharge today, however will need outpatient imaging and follow-up of her RLQ drain.   Past Medical History:  Diagnosis Date  . COPD, severe ([place])   . HTN (hypertension)   . Pneumonia   . Pulmonary emphysema ([place])   . SOB (shortness of breath)     Past Surgical History:  Procedure Laterality Date  . CATARACT EXTRACTION    . ENDOBRONCHIAL ULTRASOUND Bilateral 12/17/2016   Procedure: ENDOBRONCHIAL ULTRASOUND;  Surgeon: Javier Glazier, MD;  Location: [facility];  Service: Cardiopulmonary;  Laterality: Bilateral;  . IR GENERIC HISTORICAL  12/21/2016   IR SINUS/FIST TUBE CHK-NON GI 12/21/2016 [facility]  . IR GENERIC HISTORICAL  12/21/2016   IR US GUIDE BX ASP/DRAIN 12/21/2016 Corrie Mckusick, DO [facility]    Allergies: Ciprofloxacin; Flagyl [metronidazole]; and Zosyn [piperacillin sod-tazobactam so]  Medications: Prior to Admission medications   Medication Sig Start Date End Date Taking? Authorizing Provider  acetaminophen (TYLENOL) 325 MG tablet Take 2 tablets (650 mg total) by mouth every 6 (six) hours as needed for mild pain (breakthrough pain). 12/25/16  Yes Earnstine Regal, PA-C  albuterol (PROVENTIL HFA;VENTOLIN HFA) 108 (90 Base) MCG/ACT inhaler Inhale 2 puffs  into the lungs every 6 (six) hours as needed for wheezing or shortness of breath.   Yes Historical Provider, MD  arformoterol (BROVANA) 15 MCG/2ML NEBU Take 2 mLs (15 mcg total) by nebulization every 12 (twelve) hours. 12/25/16  Yes Debbe Odea, MD  budesonide (PULMICORT) 0.5 MG/2ML nebulizer solution Take 2 mLs (0.5 mg total) by nebulization every 12 (twelve) hours. 12/25/16  Yes Debbe Odea, MD  Cholecalciferol (VITAMIN D PO) Take 5,000 Units by mouth daily.    Yes Historical Provider, MD  escitalopram (LEXAPRO) 10 MG tablet Take 10 mg by mouth daily.   Yes Historical Provider, MD  famotidine (PEPCID) 40 MG tablet Take 1 tablet (40 mg total) by mouth 2 (two) times daily. 12/25/16  Yes Debbe Odea, MD  furosemide (LASIX) 40 MG tablet Take 1 tablet (40 mg total) by mouth 2 (two) times daily. To be adjusted at SNF based on daily weights and renal function 12/25/16  Yes Debbe Odea, MD  gabapentin (NEURONTIN) 100 MG capsule Take 1 capsule (100 mg total) by mouth 3 (three) times daily. 12/25/16  Yes Debbe Odea, MD  hydrOXYzine (ATARAX/VISTARIL) 25 MG tablet Take 25 mg by mouth 3 (three) times daily as needed for anxiety.   Yes Historical Provider, MD  losartan (COZAAR) 100 MG tablet Take 1 tablet (100 mg total) by mouth daily. 12/26/16  Yes Debbe Odea, MD  metoprolol 75 MG TABS Take 150 mg by mouth 2 (two) times daily. 12/25/16  Yes Debbe Odea, MD  nicotine (NICODERM CQ - DOSED IN MG/24 HOURS) 21 mg/24hr patch Place 21 mg onto the skin daily.   Yes Historical Provider,  MD  ondansetron (ZOFRAN-ODT) 4 MG disintegrating tablet Take 1 tablet (4 mg total) by mouth every 6 (six) hours as needed for nausea. 12/25/16  Yes Earnstine Regal, PA-C  polyethylene glycol (MIRALAX / GLYCOLAX) packet Take 17 g by mouth daily. Hold for diarrhea 12/26/16  Yes Debbe Odea, MD  potassium chloride SA (K-DUR,KLOR-CON) 20 MEQ tablet Take 1 tablet (20 mEq total) by mouth daily. 01/01/17   Mauricio Gerome Apley, MD  predniSONE  (DELTASONE) 20 MG tablet Take 1 tablet (20 mg total) by mouth daily with breakfast. Taper to 10 mg tomorrow and 5 mg the next day Patient not taking: Reported on 12/30/2016 12/26/16   Debbe Odea, MD     Family History  Problem Relation Age of Onset  . Allergies    . COPD Cousin   . Breast cancer Mother   . Diabetes Father   . Diabetes Son     Social History   Social History  . Marital status: Widowed    Spouse name: N/A  . Number of children: N/A  . Years of education: N/A   Occupational History  . retired    Social History Main Topics  . Smoking status: Former Smoker    Packs/day: 0.50    Years: 50.00    Types: Cigarettes    Quit date: 11/01/2016  . Smokeless tobacco: Never Used  . Alcohol use No  . Drug use: No  . Sexual activity: Not Asked   Other Topics Concern  . None   Social History Narrative   [facility] (12/10/16):   Patient's widower son and grandchildren moved in with her. Her husband passed years ago. Previously worked in [employer].    Review of Systems: A 12 point ROS discussed and pertinent positives are indicated in the HPI above.  All other systems are negative.  Review of Systems  Vital Signs: BP (!) 157/55 (BP Location: Left Arm)   Pulse 67   Temp 98 F (36.7 C) (Oral)   Resp 18   Ht '5\' 7"'$  (1.702 m)   Wt 240 lb 4.8 oz (109 kg)   SpO2 98%   BMI 37.64 kg/m   Physical Exam  NAD, alert Abd:  RLQ remains in place.  Insertion site is c/d/I.  Dressing dry.  Minimal output in bag is clear, yellow  Imaging: Ct Angio Chest Pe W And/or Wo Contrast  Result Date: 12/30/2016 CLINICAL DATA:  SOB/respiratory distress. Pt received 2 puff pf albuterol at nursing home and had a relief. BP 144/45 P 76, O2 95% on 3 liter pt is on 2 liters at home. Pt have hx of CA. EXAM: CT ANGIOGRAPHY CHEST WITH CONTRAST TECHNIQUE: Multidetector CT imaging of the chest was performed using the standard protocol during bolus administration of intravenous contrast.  Multiplanar CT image reconstructions and MIPs were obtained to evaluate the vascular anatomy. CONTRAST:  100 mL of Isovue 370 intravenous contrast COMPARISON:  Current chest radiograph.  Chest CT, 12/08/2016. FINDINGS: Cardiovascular: No evidence of pulmonary embolism. The heart is mildly enlarged. There are mild coronary artery calcifications. The thoracic aorta is normal in caliber. No dissection. Mild atherosclerotic plaque noted along the thoracic aorta and arch branch vessels. Mediastinum/Nodes: There is mediastinal adenopathy. A right peritracheal node in the superior mediastinum measures 15 mm in short axis. A right subcarinal node measures 2 cm short axis. No neck base or axillary masses or adenopathy. Abnormal soft tissue surrounds the right hilar structures. No left hilar mass or discrete enlarged lymph nodes.  Lungs/Pleura: The bronchus intermedius is occluded. This may be from endobronchial secretions or focal mass. There is complete atelectasis of the right lower lobe and right middle lobe. 20 x 13 mm nodule in the left upper lobe, centered on image 27, series 12. No other discrete lung nodules. There is bronchial wall thickening to the left lower lobe with dependent atelectasis. Minimal dependent atelectasis in the left upper lobe. Mild scarring or atelectasis is noted at the anterior base of the left upper lobe lingula. There is bilateral interstitial thickening and there changes of centrilobular and upper lobe paraseptal emphysema. Small bilateral pleural effusions are noted. There is no pneumothorax. Upper Abdomen: No acute findings. There is relative enlargement of the lateral segment of the left liver lobe with some surface nodularity. This suggests cirrhosis. Musculoskeletal: No fracture or acute finding. No osteoblastic or osteolytic lesions. Review of the MIP images confirms the above findings. IMPRESSION: 1. No evidence of a pulmonary embolism. 2. Mild mediastinal adenopathy.  This may be  reactive or metastatic. 3. Occluded bronchus intermedius with atelectasis of the right middle and lower lobes. This may be due to endobronchial secretions or an endobronchial mass. 4. 20 x 13 mm nodule in the left upper lobe. 5. Dependent atelectasis in the left lower lobe and minimally in the left upper lobe. 6. Mild interstitial thickening, small effusions and cardiomegaly. Mild congestive heart failure suspected. 7. Emphysema. Electronically Signed   By: Lajean Manes M.D.   On: 12/30/2016 15:17   Mr Jeri Cos DU Contrast  Result Date: 12/25/2016 CLINICAL DATA:  71 y/o F; lung cancer, evaluate for intracranial metastatic disease. EXAM: MRI HEAD WITHOUT AND WITH CONTRAST TECHNIQUE: Multiplanar, multiecho pulse sequences of the brain and surrounding structures were obtained without and with intravenous contrast. CONTRAST:  76m MULTIHANCE GADOBENATE DIMEGLUMINE 529 MG/ML IV SOLN COMPARISON:  None. FINDINGS: Moderate to severe motion artifact of all sequences due to patient confusion. Brain: No acute infarction, hemorrhage, hydrocephalus, extra-axial collection or mass lesion. No abnormal enhancement identified. Foci of T2 FLAIR hyperintense signal abnormality in subcortical and periventricular white matter are nonspecific but compatible with mild chronic microvascular ischemic changes. Mild brain parenchymal volume loss. Vascular: Normal flow voids. Skull and upper cervical spine: Normal marrow signal. Sinuses/Orbits: Small left maxillary sinus mucous retention cyst. Left mastoid air cell effusion. Bilateral intra-ocular lens replacement. Other: None. IMPRESSION: 1. Moderate to severe motion artifact. If clinically indicated, short interval follow-up when patient is better able to tolerate examination is recommended. 2. No definite evidence of intracranial metastatic disease. 3. Mild chronic microvascular ischemic changes and mild parenchymal volume loss of the brain. Electronically Signed   By: LKristine GarbeM.D.   On: 12/25/2016 00:37   [place]Chest  Result Date: 12/11/2016 CLINICAL DATA:  Patient with history of right lung endobronchial mass, perforated appendicitis with recent abscess drainage, small pleural effusions. Request made for right thoracentesis . EXAM: CHEST ULTRASOUND COMPARISON:  CT chest done 12/08/2016 FINDINGS: Limited ultrasound right posterior chest region reveals a very small loculated pleural effusion. IMPRESSION: Limited ultrasound right posterior chest today reveals a very small loculated pleural effusion. Options of proceeding with diagnostic right thoracentesis versus obtaining follow-up assessment of fluid volume in a few days for thoracentesis were discussed with patient. She wishes to postpone thoracentesis today and re-evaluate fluid volume in a few days. Procedure not performed. Read by: KRowe Robert PA-C Electronically Signed   By: HJacqulynn CadetM.D.   On: 12/11/2016 10:36   Ct Abdomen Pelvis  W Contrast  Result Date: 12/21/2016 CLINICAL DATA:  History of ruptured appendicitis, post percutaneous periappendiceal drainage catheter placement on 12/04/2016 and left trans gluteal approach percutaneous drainage catheter placement on 12/14/2016. EXAM: CT ABDOMEN AND PELVIS WITH CONTRAST TECHNIQUE: Multidetector CT imaging of the abdomen and pelvis was performed using the standard protocol following bolus administration of intravenous contrast. CONTRAST:  100 ISOVUE-300 IOPAMIDOL (ISOVUE-300) INJECTION 61% COMPARISON:  CT-guided periappendiceal abscess drainage catheter placement- 12/04/2016; left fibula approach percutaneous drainage catheter placement - 12/14/2016; CT abdomen pelvis -12/13/2016; 12/03/2016 ; chest CT- 11/14/2016 FINDINGS: Lower chest: Limited visualization of the lower thorax demonstrates chronic atelectasis / collapse of the right lower lobe. Interval increase in size of small left-sided effusion with complete atelectasis of the imaged portion of the  left lower lobe. Interval increase in small right-sided pleural effusion. Cardiomegaly. Coronary artery calcifications. No pericardial effusion. Hepatobiliary: Mild nodularity hepatic contour. No discrete hepatic lesions. Normal appearance of the gallbladder given degree distention. No radiopaque gallstones. No intra extrahepatic bili duct dilatation. No ascites. Pancreas: Normal appearance of the pancreas Spleen: Normal appearance of the spleen. Note is made of a small splenule. Adrenals/Urinary Tract: There is symmetric enhancement and excretion of the bilateral kidneys. The left kidney appears mildly hypertrophied in comparison to the right. No definite renal stones this postcontrast examination. No discrete renal lesions. There is a minimal amount of likely age and body habitus related perinephric stranding. No urinary obstruction. Normal appearance the bilateral adrenal glands. Normal appearance of the urinary bladder given underdistention with a focal catheter. Stomach/Bowel: Partial retraction of Terry appendiceal abscess drainage catheter with radiopaque site marker now projecting external to the right abdominal musculature (image 67, series 2). The dominant component of the pigtail does remain within a residual 3.8 x 1.9 cm periappendiceal fluid collection (coronal image 54, series 5). Interval retraction of left trans gluteal approach percutaneous drainage catheter with radiopaque site marker now within in the central aspect of the left pelvic sidewall musculature (image 75, series 2). The tip of the pigtail drain does remain appropriately positioned within the post a vesicular space. There has been complete resolution of previously noted pelvic collection. A small amount of fluid is seen within the pelvic cul-de-sac without peripheral wall enhancement. Large colonic stool burden without evidence of enteric obstruction. The colon is again noted to be interposed about the liver edge normal appearance of the  terminal ileum. No pneumoperitoneum, pneumatosis or portal venous gas. No new definable/ drainable fluid collections within the abdomen or pelvis. Vascular/Lymphatic: Large amount of irregular mixed calcified and noncalcified atherosclerotic plaque throughout the abdominal aorta. Suspected severe (at least 75%) luminal narrowing involving the right common iliac artery (axial image 58, series 2, coronal image 77, series 5), incompletely evaluated on this non CTA examination. The major branch vessels of the abdominal aorta appear patent on this non CTA examination. Scattered retroperitoneal, pelvic and inguinal lymph nodes are numerous though individually not enlarged by size criteria in presumed reactive due to patient body habitus. Reproductive: Normal appearance of the pelvic organs. No discrete adnexal lesion. Other: Large amount of body wall edema. Musculoskeletal: No acute or aggressive osseous abnormalities. Moderate DDD of L3-L4 with disc space height loss, endplate irregularity and sclerosis. IMPRESSION: 1. Slight retraction of peri-appendiceal drainage catheter however tip remains within residual approximately 3.8 cm residual air and fluid collection. 2. Interval resolution of pelvic fluid collection following left-sided trans gluteal percutaneous drainage catheter placement. Note, the left-sided percutaneous drainage catheter has also been slightly retracted though  tip remains well positioned within the midline of the lower pelvis. 3. No new definable/drainable abdominal or pelvic fluid collections. 4. Large colonic stool burden without evidence of enteric obstruction. 5. Similar appearance of known right lower lobe mass as previously characterized on dedicated chest CT performed 11/14/2016. 6. Increased bilateral pleural effusions with worsening left basilar atelectasis, though note, underlying infection is not excluded. 7. Cardiomegaly with increased size of bilateral pleural effusions and worsening  diffuse body wall edema, constellation of findings worrisome for CHF. 8. Nodularity hepatic contour suggestive of early cirrhotic change. Correlation LFTs is recommended. 9. Aortic Atherosclerosis (ICD10-170.0) Suspected hemodynamically significant stenosis involving the right common iliac artery, incompletely evaluated on this non CTA examination. Correlation for right lower extremity PAD symptoms is recommended. Electronically Signed   By: Sandi Mariscal M.D.   On: 12/21/2016 12:44   Ct Abdomen Pelvis W Contrast  Result Date: 12/13/2016 CLINICAL DATA:  Status post drain placement for ruptured appendix. Right lower quadrant pain. Right lung endobronchial mass. EXAM: CT ABDOMEN AND PELVIS WITH CONTRAST TECHNIQUE: Multidetector CT imaging of the abdomen and pelvis was performed using the standard protocol following bolus administration of intravenous contrast. CONTRAST:  136m ISOVUE-300 IOPAMIDOL (ISOVUE-300) INJECTION 61%, 142mISOVUE-300 IOPAMIDOL (ISOVUE-300) INJECTION 61% COMPARISON:  12/03/2016. FINDINGS: Lower chest: Right worse than left base airspace disease with heterogeneous right lung attenuation, likely related to the known central obstruction. Small bilateral pleural effusions are similar to 12/08/2016. Moderate cardiomegaly. Hepatobiliary: Cirrhosis, without focal liver lesion. Normal gallbladder, without biliary ductal dilatation. Pancreas: Normal, without mass or ductal dilatation. Spleen: Normal in size, without focal abnormality. Adrenals/Urinary Tract: Normal adrenal glands. Normal kidneys, without hydronephrosis. Normal urinary bladder. Stomach/Bowel: Normal stomach, without wall thickening. Normal colon and terminal ileum. The appendix is no longer confidently identified. Normal small bowel caliber. Vascular/Lymphatic: Advanced aortic and branch vessel atherosclerosis. Patent portal and splenic veins. No evidence of portal venous hypertension. No abdominopelvic adenopathy. Reproductive: Normal  uterus.  No adnexal mass. Other: Similar small volume perihepatic ascites. Loculated ascites with peritoneal thickening in the anterior left abdomen. Example 14.5 x 1.8 cm on image 47/series 2. This is similar to decreased from 17.1 x 2.7 cm on the prior. Percutaneous drain within the right lower quadrant. No surrounding fluid collection identified. Cul-de-sac fluid with peritoneal thickening. Collection measures 6.8 x 3.6 cm on image 75/series 2. This is similar in size on the prior exam. The peritoneal thickening is new or increased. contiguous trace left pelvic fluid on image 76/series 2. Anasarca. 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 Interval percutaneous drain of the right lower quadrant fluid collection, which has resolved. 2. Loculated ascites within the left side of the abdomen and pelvic cul-de-sac. These are similar to decreased in size since 12/03/2016. Suspicious for infected ascites. 3. Similar appearance of the lung bases, with pleural fluid and bibasilar consolidation. Please see chest CT of 12/08/2016 for description of probable right sided neoplasm. Electronically Signed   By: KyAbigail Miyamoto.D.   On: 12/13/2016 12:16   Ct Abdomen Pelvis W Contrast  Result Date: 12/03/2016 CLINICAL DATA:  RIGHT lower quadrant pain for 3 days. History of COPD, hypertension. EXAM: CT ABDOMEN AND PELVIS WITH CONTRAST TECHNIQUE: Multidetector CT imaging of the abdomen and pelvis was performed using the standard protocol following bolus administration of intravenous contrast. CONTRAST:  10045mSOVUE-300 IOPAMIDOL (ISOVUE-300) INJECTION 61% COMPARISON:  CT chest November 14, 2016 FINDINGS: LOWER CHEST: Heterogeneously hypo dense stable mass RIGHT lower lobe. LEFT lung base atelectasis  with small LEFT pleural effusion. Resolution of pericardial effusion. Heart size is mildly enlarged. Mild coronary artery calcifications. HEPATOBILIARY: Nodular liver consistent with cirrhosis, otherwise  unremarkable. Normal gallbladder. PANCREAS: Normal. SPLEEN: Normal. ADRENALS/URINARY TRACT: Kidneys are orthotopic, demonstrating symmetric enhancement. Asymmetrically smaller RIGHT kidney, unchanged. No nephrolithiasis, hydronephrosis or solid renal masses. The unopacified ureters are normal in course and caliber. Delayed imaging through the kidneys demonstrates symmetric prompt contrast excretion within the proximal urinary collecting system. Urinary bladder is partially distended and unremarkable. Normal adrenal glands. STOMACH/BOWEL: The stomach, small and large bowel are normal in course and caliber without inflammatory changes. 2.9 x 5.8 x 5 cm (transverse by AP by CC) rim enhancing the fluid collection and gas RIGHT lower quadrant contiguous with the appendix which is enlarged at 13 mm. VASCULAR/LYMPHATIC: Aortoiliac vessels are normal in course and caliber, severe calcific atherosclerosis. No lymphadenopathy by CT size criteria. Mild Misty mesentery. REPRODUCTIVE: Normal. OTHER: Moderate low-density intraperitoneal free fluid. Mild smoothly enhancing peritoneum. MUSCULOSKELETAL: Nonacute. Small fat and fluid containing umbilical hernia. Grade 1 L4-5 anterolisthesis. Moderate to severe degenerative change of lower lumbar spine. IMPRESSION: Perforated appendicitis with 2.9 x 5.8 x 5 cm RIGHT lower quadrant contained perforation/abscess. Moderate ascites with suspected peritonitis. Cirrhosis. Stable RIGHT lower lobe mass previously characterized as primary lung neoplasm on CT chest November 14, 2016. Slightly increasing small LEFT pleural effusion. Acute findings discussed with and reconfirmed by Dr.ANTHONY ALLEN on 12/03/2016 at 7:07 pm. Electronically Signed   By: Elon Alas M.D.   On: 12/03/2016 19:08   Ir Sinus/fist Tube Chk-non Gi  Result Date: 12/21/2016 INDICATION: 71 year old female with a history of ruptured appendicitis and percutaneous drainage. CT drain placed 12/04/2016 EXAM: FLUOROSCOPIC  DRAIN INJECTION MEDICATIONS: The patient is currently admitted to the hospital and receiving intravenous antibiotics. The antibiotics were administered within an appropriate time frame prior to the initiation of the procedure. ANESTHESIA/SEDATION: None COMPLICATIONS: None PROCEDURE: Informed written consent was obtained from the patient after a thorough discussion of the procedural risks, benefits and alternatives. All questions were addressed. Sterile technique was utilized including caps, mask, sterile gowns, sterile gloves, sterile drape, hand hygiene and skin antiseptic. A timeout was performed prior to the initiation of the procedure. Patient positioned supine position on the fluoroscopy table. Scout images of the right lower abdomen performed. Contrast was injected through the indwelling drain with images stored. Drain was then flushed and attached to gravity drainage. Patient tolerated procedure well and remained hemodynamically stable throughout. No complications were encountered and no significant blood loss. FINDINGS: Initial image demonstrates drain within the right lower quadrant. Injection of the drain demonstrates immediate filling of the tubular structure which is connected to the cecum. Small amount of contrast decompressed through the skin site along the tract of the drain. No abscess cavity identified. IMPRESSION: Status post drain injection of right lower quadrant in the region of prior ruptured appendicitis, confirming fistulous connection of the drainage catheter with the appendix. It appears as though the ruptured appendix has closed around the drain, with the current drain acting as a cecostomy tube. These results were called by telephone at the time of interpretation on 12/21/2016 at 2:34 pm to Dr. Earnstine Regal , who verbally acknowledged these results. Signed, Dulcy Fanny. Earleen Newport, DO [facility] Electronically Signed   By: Corrie Mckusick D.O.   On: 12/21/2016 14:35   Ir US Guide Bx Asp/drain  Result Date: 12/21/2016 INDICATION: 71 year old female with axillary lymphadenopathy. EXAM: IR ULTRASOUND GUIDANCE  MEDICATIONS: None. ANESTHESIA/SEDATION: None FLUOROSCOPY TIME:  None COMPLICATIONS: None PROCEDURE: Informed written consent was obtained from the patient after a thorough discussion of the procedural risks, benefits and alternatives. All questions were addressed. Maximal Sterile Barrier Technique was utilized including caps, mask, sterile gowns, sterile gloves, sterile drape, hand hygiene and skin antiseptic. A timeout was performed prior to the initiation of the procedure. Patient positioned supine position on the fluoroscopic table. Ultrasound images of the right axillary region were performed with images stored and sent to PACs. The patient was then prepped and draped in the usual sterile fashion. The skin and subcutaneous tissues were generously infiltrated 1% lidocaine for local anesthesia. Multiple core biopsy were achieved using 18 gauge device. Images were stored. Tissue samples placed in the saline. Patient tolerated the procedure well and remained hemodynamically stable throughout. No complications were encountered and no significant blood loss. IMPRESSION: Status post ultrasound-guided biopsy of right axillary node. Tissue specimen sent to pathology for complete histopathologic analysis. Signed, Dulcy Fanny. Earleen Newport, DO [facility] Electronically Signed   By: Corrie Mckusick D.O.   On: 12/21/2016 15:14   Dg Chest Port 1 View  Result Date: 12/31/2016 CLINICAL DATA:  CHF. EXAM: PORTABLE CHEST 1 VIEW COMPARISON:  CT 12/30/2016.  Chest x-ray 12/30/2016. FINDINGS: Cardiomegaly with pulmonary vascular prominence and bilateral interstitial prominence. Interim partial clearing of bibasilar atelectasis. Bilateral pleural effusions again noted. No pneumothorax. IMPRESSION: 1.  Cardiomegaly with pulmonary vascular prominence and bilateral interstitial prominence. Findings consistent with mild CHF. Bilateral pleural effusions again noted. Similar findings noted on prior exams. 2. Interim partial clearing of bibasilar atelectasis. Electronically Signed   By: Marcello Moores  Register   On: 12/31/2016 06:55   Dg Chest Portable 1 View  Result Date: 12/30/2016 CLINICAL DATA:  Shortness of breath since this morning.  Ex-smoker. EXAM: PORTABLE CHEST 1 VIEW COMPARISON:  12/19/2016. FINDINGS: Stable enlarged cardiac silhouette and prominent pulmonary vasculature and interstitial markings. Small to moderate-sized right pleural effusion with mild right basilar atelectasis with little change. Aortic arch calcifications. Unremarkable bones. IMPRESSION: Stable cardiomegaly and changes of congestive heart failure. Aortic atherosclerosis. Electronically Signed   By: Claudie Revering M.D.   On: 12/30/2016 12:13   Dg Chest Port 1 View  Result Date: 12/19/2016 CLINICAL DATA:  Respiratory distress EXAM: PORTABLE CHEST 1 VIEW COMPARISON:  December 18, 2016 FINDINGS: Central catheter tip is in the superior vena cava. No pneumothorax. There is airspace consolidation in each lower lobe with small pleural effusions bilaterally. There is cardiomegaly with mild pulmonary venous hypertension. No adenopathy. There is atherosclerotic calcification in the aorta. No bone lesions. IMPRESSION: Central catheter tip in superior vena cava. No pneumothorax. Findings indicative of a degree of congestive heart failure. Superimposed bibasilar pneumonia cannot be excluded radiographically. Pneumonia and alveolar edema may present concurrently. There is aortic atherosclerosis. Electronically Signed   By: Lowella Grip III M.D.   On: 12/19/2016 08:56   Dg Chest Port 1 View  Result Date: 12/18/2016 CLINICAL DATA:  Low oxygen saturation. EXAM: PORTABLE CHEST 1 VIEW COMPARISON:  12/17/2016.  CT 12/08/2016. FINDINGS: Right PICC line  noted with tip projected over the right atrium. Cardiomegaly with pulmonary vascular prominence, bilateral interstitial prominence, bilateral pleural effusions. Findings consistent CHF. Interim slight progression from prior exam. Right infrahilar mass again noted. IMPRESSION: 1. Right PICC line in stable position. 2. Congestive heart failure bilateral pulmonary interstitial edema bilateral pleural effusions. Interstitial edema is progressed from prior exam. 3. Right infrahilar mass again  noted. Electronically Signed   By: [place]   On: 12/18/2016 14:00   Dg Chest Port 1 View  Result Date: 12/17/2016 CLINICAL DATA:  Status post bronchoscopy. EXAM: PORTABLE CHEST 1 VIEW COMPARISON:  CT chest 12/08/2016 FINDINGS: A right-sided PICC line is stable. The heart is enlarged. Atherosclerotic changes are present at the aortic arch. Mild pulmonary vascular congestion is present. Right greater than left pleural effusions are noted. Bibasilar airspace disease is associated. IMPRESSION: 1. Cardiomegaly and mild pulmonary vascular congestion. 2. Persistent bilateral pleural effusions, right greater than left. 3. Partial collapse of the right lower lobe in part related to central soft tissue mass. 4. No radiographic evidence for complication. Electronically Signed   By: San Morelle M.D.   On: 12/17/2016 10:08   Dg Chest Port 1 View  Result Date: 12/05/2016 CLINICAL DATA:  Weakness and shortness of breath EXAM: PORTABLE CHEST 1 VIEW COMPARISON:  11/19/2016 FINDINGS: Moderate cardiomegaly with central vascular congestion. There are small bilateral pleural effusions and hazy bibasilar atelectasis or infiltrate. No pneumothorax. Atherosclerosis. IMPRESSION: 1. Cardiomegaly with central vascular congestion and small bilateral effusions 2. Hazy bibasilar atelectasis or infiltrates. Electronically Signed   By: Donavan Foil M.D.   On: 12/05/2016 15:29   Ct Image Guided Drainage By Percutaneous Catheter  Result  Date: 12/14/2016 INDICATION: Postop pelvic fluid collection, status post perforated appendicitis, worsening white count EXAM: CT-GUIDED PELVIC FLUID COLLECTION DRAINAGE VIA A LEFT TRANS GLUTEAL APPROACH. MEDICATIONS: The patient is currently admitted to the hospital and receiving intravenous antibiotics. The antibiotics were administered within an appropriate time frame prior to the initiation of the procedure. ANESTHESIA/SEDATION: Fentanyl 200 mcg IV; Versed 3.0 mg IV Moderate Sedation Time:  21 minutes The patient was continuously monitored during the procedure by the interventional radiology nurse under my direct supervision. COMPLICATIONS: None. PROCEDURE: Informed written consent was obtained from the patient after a thorough discussion of the procedural risks, benefits and alternatives. All questions were addressed. Maximal Sterile Barrier Technique was utilized including caps, mask, sterile gowns, sterile gloves, sterile drape, hand hygiene and skin antiseptic. A timeout was performed prior to the initiation of the procedure. Previous imaging reviewed. Patient positioned nearly prone. Noncontrast localization CT performed. The left trans gluteal approach to the cul-de-sac pelvic fluid collection was localized. Overlying skin marked. Under sterile conditions and local anesthesia, a 17 gauge 16.8 cm access needle was advanced from a left trans gluteal posterior oblique approach into the fluid collection. Needle position confirmed with CT. Guidewire inserted. Guidewire position confirmed with CT. Tract dilatation performed to insert a 10 [place] drain. Drain catheter positioned within the fluid collection. Position confirmed with CT. Catheter secured with a Prolene suture and connected to external suction bulb. Sterile dressing applied. No immediate complication. Patient tolerated the procedure well. IMPRESSION: Successful CT-guided pelvic fluid collection drain insertion via left trans gluteal approach.  Electronically Signed   By: Jerilynn Mages.  Shick M.D.   On: 12/14/2016 16:36   Ct Image Guided Drainage By Percutaneous Catheter  Result Date: 12/05/2016 INDICATION: Periappendiceal abscess EXAM: CT GUIDED DRAINAGE OF RIGHT LOWER QUADRANT ABSCESS MEDICATIONS: The patient is currently admitted to the hospital and receiving intravenous antibiotics. The antibiotics were administered within an appropriate time frame prior to the initiation of the procedure. ANESTHESIA/SEDATION: Two mg IV Versed 75 mcg IV Fentanyl Moderate Sedation Time:  17 The patient was continuously monitored during the procedure by the interventional radiology nurse under my direct supervision. COMPLICATIONS: None immediate. TECHNIQUE: Informed written consent was obtained from  the patient after a thorough discussion of the procedural risks, benefits and alternatives. All questions were addressed. Maximal Sterile Barrier Technique was utilized including caps, mask, sterile gowns, sterile gloves, sterile drape, hand hygiene and skin antiseptic. A timeout was performed prior to the initiation of the procedure. PROCEDURE: The right lower quadrant was prepped with ChloraPrep in a sterile fashion, and a sterile drape was applied covering the operative field. A sterile gown and sterile gloves were used for the procedure. Local anesthesia was provided with 1% Lidocaine. Under CT guidance, an 18 gauge needle was advanced into the right lower quadrant abscess and removed over an Amplatz wire. Twelve [place] dilator followed by a 12 [place] drain were inserted. It was looped and string fixed then sewn to the skin. FINDINGS: Images document right lower quadrant 12 French drain placement into an abscess. IMPRESSION: Successful CT-guided right lower quadrant abscess 12 French drain. Electronically Signed   By: Marybelle Killings M.D.   On: 12/05/2016 09:10   Ct Chest Nodule Follow Up Low Dose W/o  Result Date: 12/08/2016 CLINICAL DATA:  71 year old female admitted with  perforated appendicitis status post percutaneous drain placement for right lower quadrant abscess 4 days prior, presenting for follow-up of recently diagnosed right lower lobe lung mass. EXAM: CT CHEST WITHOUT CONTRAST TECHNIQUE: Multidetector CT imaging of the chest was performed following the standard protocol without IV contrast. COMPARISON:  11/14/2016 chest CT. 12/05/2016 chest radiograph. FINDINGS: Cardiovascular: Mild cardiomegaly. No residual significant pericardial effusion/ thickening. Left anterior descending, left circumflex and right coronary atherosclerosis. Atherosclerotic thoracic aorta. Stable ectasia of the ascending thoracic aorta with maximum diameter 4.2 cm. Stable dilated main pulmonary artery (4.1 cm diameter). Mediastinum/Nodes: No discrete thyroid nodules. Unremarkable esophagus. New bilateral axillary lymphadenopathy measuring up to the 1.2 cm on the right (series 2/ image 34) and 1.2 cm on the left (series 2/image 38). Mild right paratracheal adenopathy measuring up to the 1.2 cm (series 2/ image 59), stable. Mildly enlarged 1.2 cm subcarinal node (series 2/ image 71), stable. No additional pathologically enlarged mediastinal nodes. Stable mild right hilar lymphadenopathy, poorly delineated on this noncontrast scan. No gross left hilar adenopathy on this noncontrast scan. Lungs/Pleura: Small dependent bilateral pleural effusions, increased bilaterally since 11/14/2016. Associated mild smooth pleural thickening on the right without discrete pleural nodularity. No pneumothorax. Stable soft tissue density occlusion of the right lower lobe bronchus with complete right lower lobe atelectasis, suspicious for obstructing central right lower lobe lung mass, poorly delineated on this noncontrast chest CT, not appreciably changed since 11/14/2016. The right middle lobe bronchus also appears occluded with soft tissue density with worsened significant right middle lobe atelectasis. Mild centrilobular  and paraseptal emphysema with mild diffuse bronchial wall thickening. Irregular 2.1 x 1.4 cm solid medial left upper lobe pulmonary nodule (series 5/ image 52), stable since 11/14/2016. Mild interlobular septal thickening throughout both lungs. Mild-to-moderate compressive atelectasis in the dependent left lower lobe. Upper abdomen: Relative hypertrophy of the lateral segment left liver lobe with diffuse liver surface irregularity, compatible with cirrhosis. Small volume ascites in the visualized upper abdomen. Musculoskeletal: No aggressive appearing focal osseous lesions. Mild thoracic spondylosis. IMPRESSION: 1. Persistent soft tissue density occlusion of the right lower and right middle lobe bronchi with complete right lower lobe atelectasis and worsening right middle lobe atelectasis. Primary bronchogenic carcinoma of the central right lower lung is the diagnosis of exclusion. 2. Stable right hilar, subcarinal and right paratracheal lymphadenopathy. New symmetric mild axillary lymphadenopathy. Nodal metastases not excluded. 3. Irregular  2.1 cm solid medial left upper lobe pulmonary nodule, stable since 11/14/2016, suspicious for contralateral pulmonary metastasis versus synchronous primary bronchogenic carcinoma . 4. Spectrum of findings suggestive of mild congestive heart failure including mild cardiomegaly, small dependent bilateral pleural effusions and diffuse mild interlobular septal thickening characteristic of mild pulmonary edema. 5. Mild emphysema with mild diffuse bronchial wall thickening, suggesting COPD . 6. Cirrhosis.  Small volume ascites in the upper abdomen. 7. Aortic atherosclerosis.  Three-vessel coronary atherosclerosis. 8. 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 Recommend annual imaging followup by CTA or MRA. This recommendation follows 2010 ACCF/AHA/AATS/ACR/ASA/SCA/SCAI/SIR/STS/SVM Guidelines for the Diagnosis and Management of Patients with Thoracic  Aortic Disease. Circulation. 2010; 121: [id number]. 9. Stable prominently dilated main pulmonary artery, suggesting pulmonary arterial hypertension. Electronically Signed   By: Ilona Sorrel M.D.   On: 12/08/2016 11:30    Labs:  CBC:  Recent Labs  12/21/16 0400 12/22/16 0500 12/24/16 0354 12/30/16 1209  WBC 12.1* 11.2* 7.5 10.5  HGB 9.7* 9.9* 9.9* 9.7*  HCT 30.1* 31.2* 29.9* 30.2*  PLT 207 197 203 257    COAGS:  Recent Labs  12/04/16 1005  INR 1.15    BMP:  Recent Labs  12/26/16 0445 12/30/16 1209 12/31/16 0103 01/01/17 0548  NA 133* 138 138 137  K 4.0 4.6 4.6 4.5  CL 86* 95* 92* 92*  CO2 40* 37* 36* 37*  GLUCOSE 82 96 136* 73  BUN 31* 26* 31* 31*  CALCIUM 8.5* 8.5* 8.8* 8.6*  CREATININE 0.89 0.76 1.00 0.90  GFRNONAA >60 >60 56* >60  GFRAA >60 >60 >60 >60    LIVER FUNCTION TESTS:  Recent Labs  12/19/16 1006 12/22/16 0500 12/30/16 1209 12/31/16 0103  BILITOT 0.5 0.6 0.6 0.7  AST 13* 24 14* 18  ALT 11* '23 14 15  '$ ALKPHOS 63 59 55 52  PROT 5.1* 5.5* 5.9* 6.1*  ALBUMIN 2.1* 2.4* 3.1* 3.0*    TUMOR MARKERS: No results for input(s): AFPTM, CEA, CA199, CHROMGRNA in the last 8760 hours.  Assessment and Plan: Perforated appendicitis with multiple intra-abdominal abscesses s/p RLQ drain placed 12/04/16. Transgluteal drain was removed 3/23. Patient was discharged 12/26/16, however returned to hospital with shortness of breath which has improved.  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 She will need outpatient follow-up. PA assessed patient who reports mild soreness related to drain, but no pain.  She states is not being flushed but has minimal output.  Patient is planning to d/c back to nursing facility today.  Discussed with Dr. Annamaria Boots. Flush once per day and return to IR outpatient clinic in 2 weeks for CT and drain injection.  Patient informed she will hear from schedulers with appointment date and time.   Thank you for this interesting  consult.  I greatly enjoyed meeting [facility] and look forward to participating in their care.  A copy of this report was sent to the requesting provider on this date.  Electronically Signed: Docia Barrier 01/01/2017, 2:09 PM   I spent a total of    15 Minutes in face to face in clinical consultation, greater than 50% of which was counseling/coordinating care for intra-abdominal abscess.

## 2017-01-01 NOTE — Progress Notes (Signed)
[facility] Therapy Treatment Record Phone [phone]   Radiation Therapy was administered to [facility] on: 01/01/2017  1:43 PM and was treatment # 8 out of a planned course of 10 treatments.  Radiation Treatment  1). Beam photons with 6-10 energy  2). Brachytherapy None  3). Stereotactic Radiosurgery None  4). Other Radiation None     [employer], RT (T)

## 2017-01-01 NOTE — Discharge Summary (Addendum)
Physician Discharge Summary  Alisha Fisher TKP:[id number] DOB: 12-05-1945 DOA: 12/30/2016  PCP: Pcp Not In System  Admit date: 12/30/2016 Discharge date: 01/01/2017  Admitted From:  Home  Disposition:   Home   Recommendations for Outpatient Follow-up:  1. Follow up with PCP in 1-week   Home Health: Na  Equipment/Devices: Home 02  Discharge Condition: Stable  CODE STATUS: Full  Diet recommendation: Heart Healthy   Brief/Interim Summary: This is a 71 year old female who presented to the hospital with a chief complaint of shortness of breath. 24 hours prior to hospitalization she developed worsening dyspnea without fevers or chills, associated with a productive cough. On the physical examination she was awake and alert, heart rate 76, temperature 98.2, oxygen saturation 95% of submental oxygen 2 L per nasal cannula, blood pressure 143/59. Her mucous members were moist, her neck was supple, lungs had diminished breath sounds bilaterally with bilateral wheezing, no rales, no rhonchi, heart was present and rhythmic, lower extremity with no edema, abdomen was soft nontender. Sodium 138, potassium 4.6, chloride 95, bicarbonate 37, glucose 96, BUN 26, creatinine 0.76, white count 10.5, 19.7, hematocrit 30.2, platelets 257, her chest x-ray showed bilateral pulmonary infiltrates, interstitial. EKG was normal sinus rhythm. CT chest was negative for pulmonary embolism, atelectasis at the right middle lobe and lower lobes, 2013 mm nodule left upper lobe, positive  interstitial thickening and small bilateral pleural effusions.   The patient was admitted to the hospital with approximately respiratory failure acute on chronic, related to pulmonary edema, due to decompensated acute on chronic diastolic heart failure.  1. Acute hypoxic respiratory failure due to cardiogenic pulmonary edema due to diastolic heart failure decompensation (acute on chronic). Patient was admitted to medical floor with remote telemetry  monitor, she received aggressive diuresis with IV furosemide, she had significant improvement of her symptoms, negative fluid balance was achieved, over last 24 hours urine output 2600 mL.   2. COPD. She was continued on bronchodilator therapy, she had a short course of IV steroids. Her dyspnea has returned to her baseline.  3. Hypertension. Patient was continued on losartan and metoprolol with good toleration. Systolic blood pressure 517-001.   4. Lung cancer. Squamous cell carcinoma, continue radiation therapy.  5. Perforated appendix. Patient has a percutaneous drain placed. Follow-up with IR  as an outpatient.  Discharge Diagnoses:  Principal Problem:   Respiratory insufficiency Active Problems:   COPD (chronic obstructive pulmonary disease) (HCC)   Squamous cell lung cancer, right (HCC)   CHF (congestive heart failure) ([place])    Discharge Instructions   Allergies as of 01/01/2017      Reactions   Ciprofloxacin Rash   Rash possibly caused by Cipro?   Flagyl [metronidazole] Rash   Rash possibly caused by Flagyl?   Zosyn [piperacillin Sod-tazobactam So] Itching, Rash   Has patient had a PCN reaction causing immediate rash, facial/tongue/throat swelling, SOB or lightheadedness with hypotension: No Has patient had a PCN reaction causing severe rash involving mucus membranes or skin necrosis: No Has patient had a PCN reaction that required hospitalization: No Has patient had a PCN reaction occurring within the last 10 years: Yes If all of the above answers are "NO", then may proceed with Cephalosporin use.      Medication List    STOP taking these medications   predniSONE 20 MG tablet Commonly known as:  DELTASONE     TAKE these medications   acetaminophen 325 MG tablet Commonly known as:  TYLENOL Take 2 tablets (650 mg  total) by mouth every 6 (six) hours as needed for mild pain (breakthrough pain).   albuterol 108 (90 Base) MCG/ACT inhaler Commonly known as:  PROVENTIL  HFA;VENTOLIN HFA Inhale 2 puffs into the lungs every 6 (six) hours as needed for wheezing or shortness of breath.   arformoterol 15 MCG/2ML Nebu Commonly known as:  BROVANA Take 2 mLs (15 mcg total) by nebulization every 12 (twelve) hours.   budesonide 0.5 MG/2ML nebulizer solution Commonly known as:  PULMICORT Take 2 mLs (0.5 mg total) by nebulization every 12 (twelve) hours.   escitalopram 10 MG tablet Commonly known as:  LEXAPRO Take 10 mg by mouth daily.   famotidine 40 MG tablet Commonly known as:  PEPCID Take 1 tablet (40 mg total) by mouth 2 (two) times daily.   furosemide 40 MG tablet Commonly known as:  LASIX Take 1 tablet (40 mg total) by mouth 2 (two) times daily. To be adjusted at SNF based on daily weights and renal function   gabapentin 100 MG capsule Commonly known as:  NEURONTIN Take 1 capsule (100 mg total) by mouth 3 (three) times daily.   hydrOXYzine 25 MG tablet Commonly known as:  ATARAX/VISTARIL Take 25 mg by mouth 3 (three) times daily as needed for anxiety.   losartan 100 MG tablet Commonly known as:  COZAAR Take 1 tablet (100 mg total) by mouth daily.   Metoprolol Tartrate 75 MG Tabs Take 150 mg by mouth 2 (two) times daily.   nicotine 21 mg/24hr patch Commonly known as:  NICODERM CQ - dosed in mg/24 hours Place 21 mg onto the skin daily.   ondansetron 4 MG disintegrating tablet Commonly known as:  ZOFRAN-ODT Take 1 tablet (4 mg total) by mouth every 6 (six) hours as needed for nausea.   polyethylene glycol packet Commonly known as:  MIRALAX / GLYCOLAX Take 17 g by mouth daily. Hold for diarrhea   potassium chloride SA 20 MEQ tablet Commonly known as:  K-DUR,KLOR-CON Take 1 tablet (20 mEq total) by mouth daily. What changed:  how much to take  when to take this   VITAMIN D PO Take 5,000 Units by mouth daily.       Allergies  Allergen Reactions  . Ciprofloxacin Rash    Rash possibly caused by Cipro?  . Flagyl [Metronidazole]  Rash    Rash possibly caused by Flagyl?  Marland Kitchen Zosyn [Piperacillin Sod-Tazobactam So] Itching and Rash    Has patient had a PCN reaction causing immediate rash, facial/tongue/throat swelling, SOB or lightheadedness with hypotension: No Has patient had a PCN reaction causing severe rash involving mucus membranes or skin necrosis: No Has patient had a PCN reaction that required hospitalization: No Has patient had a PCN reaction occurring within the last 10 years: Yes If all of the above answers are "NO", then may proceed with Cephalosporin use.     Consultations:     Procedures/Studies: Ct Angio Chest Pe W And/or Wo Contrast  Result Date: 12/30/2016 CLINICAL DATA:  SOB/respiratory distress. Pt received 2 puff pf albuterol at nursing home and had a relief. BP 144/45 P 76, O2 95% on 3 liter pt is on 2 liters at home. Pt have hx of CA. EXAM: CT ANGIOGRAPHY CHEST WITH CONTRAST TECHNIQUE: Multidetector CT imaging of the chest was performed using the standard protocol during bolus administration of intravenous contrast. Multiplanar CT image reconstructions and MIPs were obtained to evaluate the vascular anatomy. CONTRAST:  100 mL of Isovue 370 intravenous contrast COMPARISON:  Current  chest radiograph.  Chest CT, 12/08/2016. FINDINGS: Cardiovascular: No evidence of pulmonary embolism. The heart is mildly enlarged. There are mild coronary artery calcifications. The thoracic aorta is normal in caliber. No dissection. Mild atherosclerotic plaque noted along the thoracic aorta and arch branch vessels. Mediastinum/Nodes: There is mediastinal adenopathy. A right peritracheal node in the superior mediastinum measures 15 mm in short axis. A right subcarinal node measures 2 cm short axis. No neck base or axillary masses or adenopathy. Abnormal soft tissue surrounds the right hilar structures. No left hilar mass or discrete enlarged lymph nodes. Lungs/Pleura: The bronchus intermedius is occluded. This may be from  endobronchial secretions or focal mass. There is complete atelectasis of the right lower lobe and right middle lobe. 20 x 13 mm nodule in the left upper lobe, centered on image 27, series 12. No other discrete lung nodules. There is bronchial wall thickening to the left lower lobe with dependent atelectasis. Minimal dependent atelectasis in the left upper lobe. Mild scarring or atelectasis is noted at the anterior base of the left upper lobe lingula. There is bilateral interstitial thickening and there changes of centrilobular and upper lobe paraseptal emphysema. Small bilateral pleural effusions are noted. There is no pneumothorax. Upper Abdomen: No acute findings. There is relative enlargement of the lateral segment of the left liver lobe with some surface nodularity. This suggests cirrhosis. Musculoskeletal: No fracture or acute finding. No osteoblastic or osteolytic lesions. Review of the MIP images confirms the above findings. IMPRESSION: 1. No evidence of a pulmonary embolism. 2. Mild mediastinal adenopathy.  This may be reactive or metastatic. 3. Occluded bronchus intermedius with atelectasis of the right middle and lower lobes. This may be due to endobronchial secretions or an endobronchial mass. 4. 20 x 13 mm nodule in the left upper lobe. 5. Dependent atelectasis in the left lower lobe and minimally in the left upper lobe. 6. Mild interstitial thickening, small effusions and cardiomegaly. Mild congestive heart failure suspected. 7. Emphysema. Electronically Signed   By: Lajean Manes M.D.   On: 12/30/2016 15:17   Mr Jeri Cos VE Contrast  Result Date: 12/25/2016 CLINICAL DATA:  71 y/o F; lung cancer, evaluate for intracranial metastatic disease. EXAM: MRI HEAD WITHOUT AND WITH CONTRAST TECHNIQUE: Multiplanar, multiecho pulse sequences of the brain and surrounding structures were obtained without and with intravenous contrast. CONTRAST:  83m MULTIHANCE GADOBENATE DIMEGLUMINE 529 MG/ML IV SOLN COMPARISON:   None. FINDINGS: Moderate to severe motion artifact of all sequences due to patient confusion. Brain: No acute infarction, hemorrhage, hydrocephalus, extra-axial collection or mass lesion. No abnormal enhancement identified. Foci of T2 FLAIR hyperintense signal abnormality in subcortical and periventricular white matter are nonspecific but compatible with mild chronic microvascular ischemic changes. Mild brain parenchymal volume loss. Vascular: Normal flow voids. Skull and upper cervical spine: Normal marrow signal. Sinuses/Orbits: Small left maxillary sinus mucous retention cyst. Left mastoid air cell effusion. Bilateral intra-ocular lens replacement. Other: None. IMPRESSION: 1. Moderate to severe motion artifact. If clinically indicated, short interval follow-up when patient is better able to tolerate examination is recommended. 2. No definite evidence of intracranial metastatic disease. 3. Mild chronic microvascular ischemic changes and mild parenchymal volume loss of the brain. Electronically Signed   By: LKristine GarbeM.D.   On: 12/25/2016 00:37   [place]Chest  Result Date: 12/11/2016 CLINICAL DATA:  Patient with history of right lung endobronchial mass, perforated appendicitis with recent abscess drainage, small pleural effusions. Request made for right thoracentesis . EXAM: CHEST ULTRASOUND COMPARISON:  CT chest done 12/08/2016 FINDINGS: Limited ultrasound right posterior chest region reveals a very small loculated pleural effusion. IMPRESSION: Limited ultrasound right posterior chest today reveals a very small loculated pleural effusion. Options of proceeding with diagnostic right thoracentesis versus obtaining follow-up assessment of fluid volume in a few days for thoracentesis were discussed with patient. She wishes to postpone thoracentesis today and re-evaluate fluid volume in a few days. Procedure not performed. Read by: Rowe Robert, PA-C Electronically Signed   By: Jacqulynn Cadet M.D.    On: 12/11/2016 10:36   Ct Abdomen Pelvis W Contrast  Result Date: 12/21/2016 CLINICAL DATA:  History of ruptured appendicitis, post percutaneous periappendiceal drainage catheter placement on 12/04/2016 and left trans gluteal approach percutaneous drainage catheter placement on 12/14/2016. EXAM: CT ABDOMEN AND PELVIS WITH CONTRAST TECHNIQUE: Multidetector CT imaging of the abdomen and pelvis was performed using the standard protocol following bolus administration of intravenous contrast. CONTRAST:  100 ISOVUE-300 IOPAMIDOL (ISOVUE-300) INJECTION 61% COMPARISON:  CT-guided periappendiceal abscess drainage catheter placement- 12/04/2016; left fibula approach percutaneous drainage catheter placement - 12/14/2016; CT abdomen pelvis -12/13/2016; 12/03/2016 ; chest CT- 11/14/2016 FINDINGS: Lower chest: Limited visualization of the lower thorax demonstrates chronic atelectasis / collapse of the right lower lobe. Interval increase in size of small left-sided effusion with complete atelectasis of the imaged portion of the left lower lobe. Interval increase in small right-sided pleural effusion. Cardiomegaly. Coronary artery calcifications. No pericardial effusion. Hepatobiliary: Mild nodularity hepatic contour. No discrete hepatic lesions. Normal appearance of the gallbladder given degree distention. No radiopaque gallstones. No intra extrahepatic bili duct dilatation. No ascites. Pancreas: Normal appearance of the pancreas Spleen: Normal appearance of the spleen. Note is made of a small splenule. Adrenals/Urinary Tract: There is symmetric enhancement and excretion of the bilateral kidneys. The left kidney appears mildly hypertrophied in comparison to the right. No definite renal stones this postcontrast examination. No discrete renal lesions. There is a minimal amount of likely age and body habitus related perinephric stranding. No urinary obstruction. Normal appearance the bilateral adrenal glands. Normal appearance of  the urinary bladder given underdistention with a focal catheter. Stomach/Bowel: Partial retraction of Terry appendiceal abscess drainage catheter with radiopaque site marker now projecting external to the right abdominal musculature (image 67, series 2). The dominant component of the pigtail does remain within a residual 3.8 x 1.9 cm periappendiceal fluid collection (coronal image 54, series 5). Interval retraction of left trans gluteal approach percutaneous drainage catheter with radiopaque site marker now within in the central aspect of the left pelvic sidewall musculature (image 75, series 2). The tip of the pigtail drain does remain appropriately positioned within the post a vesicular space. There has been complete resolution of previously noted pelvic collection. A small amount of fluid is seen within the pelvic cul-de-sac without peripheral wall enhancement. Large colonic stool burden without evidence of enteric obstruction. The colon is again noted to be interposed about the liver edge normal appearance of the terminal ileum. No pneumoperitoneum, pneumatosis or portal venous gas. No new definable/ drainable fluid collections within the abdomen or pelvis. Vascular/Lymphatic: Large amount of irregular mixed calcified and noncalcified atherosclerotic plaque throughout the abdominal aorta. Suspected severe (at least 75%) luminal narrowing involving the right common iliac artery (axial image 58, series 2, coronal image 77, series 5), incompletely evaluated on this non CTA examination. The major branch vessels of the abdominal aorta appear patent on this non CTA examination. Scattered retroperitoneal, pelvic and inguinal lymph nodes are numerous though individually not enlarged  by size criteria in presumed reactive due to patient body habitus. Reproductive: Normal appearance of the pelvic organs. No discrete adnexal lesion. Other: Large amount of body wall edema. Musculoskeletal: No acute or aggressive osseous  abnormalities. Moderate DDD of L3-L4 with disc space height loss, endplate irregularity and sclerosis. IMPRESSION: 1. Slight retraction of peri-appendiceal drainage catheter however tip remains within residual approximately 3.8 cm residual air and fluid collection. 2. Interval resolution of pelvic fluid collection following left-sided trans gluteal percutaneous drainage catheter placement. Note, the left-sided percutaneous drainage catheter has also been slightly retracted though tip remains well positioned within the midline of the lower pelvis. 3. No new definable/drainable abdominal or pelvic fluid collections. 4. Large colonic stool burden without evidence of enteric obstruction. 5. Similar appearance of known right lower lobe mass as previously characterized on dedicated chest CT performed 11/14/2016. 6. Increased bilateral pleural effusions with worsening left basilar atelectasis, though note, underlying infection is not excluded. 7. Cardiomegaly with increased size of bilateral pleural effusions and worsening diffuse body wall edema, constellation of findings worrisome for CHF. 8. Nodularity hepatic contour suggestive of early cirrhotic change. Correlation LFTs is recommended. 9. Aortic Atherosclerosis (ICD10-170.0) Suspected hemodynamically significant stenosis involving the right common iliac artery, incompletely evaluated on this non CTA examination. Correlation for right lower extremity PAD symptoms is recommended. Electronically Signed   By: Sandi Mariscal M.D.   On: 12/21/2016 12:44   Ct Abdomen Pelvis W Contrast  Result Date: 12/13/2016 CLINICAL DATA:  Status post drain placement for ruptured appendix. Right lower quadrant pain. Right lung endobronchial mass. EXAM: CT ABDOMEN AND PELVIS WITH CONTRAST TECHNIQUE: Multidetector CT imaging of the abdomen and pelvis was performed using the standard protocol following bolus administration of intravenous contrast. CONTRAST:  121m ISOVUE-300 IOPAMIDOL  (ISOVUE-300) INJECTION 61%, 184mISOVUE-300 IOPAMIDOL (ISOVUE-300) INJECTION 61% COMPARISON:  12/03/2016. FINDINGS: Lower chest: Right worse than left base airspace disease with heterogeneous right lung attenuation, likely related to the known central obstruction. Small bilateral pleural effusions are similar to 12/08/2016. Moderate cardiomegaly. Hepatobiliary: Cirrhosis, without focal liver lesion. Normal gallbladder, without biliary ductal dilatation. Pancreas: Normal, without mass or ductal dilatation. Spleen: Normal in size, without focal abnormality. Adrenals/Urinary Tract: Normal adrenal glands. Normal kidneys, without hydronephrosis. Normal urinary bladder. Stomach/Bowel: Normal stomach, without wall thickening. Normal colon and terminal ileum. The appendix is no longer confidently identified. Normal small bowel caliber. Vascular/Lymphatic: Advanced aortic and branch vessel atherosclerosis. Patent portal and splenic veins. No evidence of portal venous hypertension. No abdominopelvic adenopathy. Reproductive: Normal uterus.  No adnexal mass. Other: Similar small volume perihepatic ascites. Loculated ascites with peritoneal thickening in the anterior left abdomen. Example 14.5 x 1.8 cm on image 47/series 2. This is similar to decreased from 17.1 x 2.7 cm on the prior. Percutaneous drain within the right lower quadrant. No surrounding fluid collection identified. Cul-de-sac fluid with peritoneal thickening. Collection measures 6.8 x 3.6 cm on image 75/series 2. This is similar in size on the prior exam. The peritoneal thickening is new or increased. contiguous trace left pelvic fluid on image 76/series 2. Anasarca. 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 Interval percutaneous drain of the right lower quadrant fluid collection, which has resolved. 2. Loculated ascites within the left side of the abdomen and pelvic cul-de-sac. These are similar to decreased in size since 12/03/2016.  Suspicious for infected ascites. 3. Similar appearance of the lung bases, with pleural fluid and bibasilar consolidation. Please see chest CT of 12/08/2016 for description of probable right sided  neoplasm. Electronically Signed   By: Abigail Miyamoto M.D.   On: 12/13/2016 12:16   Ct Abdomen Pelvis W Contrast  Result Date: 12/03/2016 CLINICAL DATA:  RIGHT lower quadrant pain for 3 days. History of COPD, hypertension. EXAM: CT ABDOMEN AND PELVIS WITH CONTRAST TECHNIQUE: Multidetector CT imaging of the abdomen and pelvis was performed using the standard protocol following bolus administration of intravenous contrast. CONTRAST:  177m ISOVUE-300 IOPAMIDOL (ISOVUE-300) INJECTION 61% COMPARISON:  CT chest November 14, 2016 FINDINGS: LOWER CHEST: Heterogeneously hypo dense stable mass RIGHT lower lobe. LEFT lung base atelectasis with small LEFT pleural effusion. Resolution of pericardial effusion. Heart size is mildly enlarged. Mild coronary artery calcifications. HEPATOBILIARY: Nodular liver consistent with cirrhosis, otherwise unremarkable. Normal gallbladder. PANCREAS: Normal. SPLEEN: Normal. ADRENALS/URINARY TRACT: Kidneys are orthotopic, demonstrating symmetric enhancement. Asymmetrically smaller RIGHT kidney, unchanged. No nephrolithiasis, hydronephrosis or solid renal masses. The unopacified ureters are normal in course and caliber. Delayed imaging through the kidneys demonstrates symmetric prompt contrast excretion within the proximal urinary collecting system. Urinary bladder is partially distended and unremarkable. Normal adrenal glands. STOMACH/BOWEL: The stomach, small and large bowel are normal in course and caliber without inflammatory changes. 2.9 x 5.8 x 5 cm (transverse by AP by CC) rim enhancing the fluid collection and gas RIGHT lower quadrant contiguous with the appendix which is enlarged at 13 mm. VASCULAR/LYMPHATIC: Aortoiliac vessels are normal in course and caliber, severe calcific atherosclerosis.  No lymphadenopathy by CT size criteria. Mild Misty mesentery. REPRODUCTIVE: Normal. OTHER: Moderate low-density intraperitoneal free fluid. Mild smoothly enhancing peritoneum. MUSCULOSKELETAL: Nonacute. Small fat and fluid containing umbilical hernia. Grade 1 L4-5 anterolisthesis. Moderate to severe degenerative change of lower lumbar spine. IMPRESSION: Perforated appendicitis with 2.9 x 5.8 x 5 cm RIGHT lower quadrant contained perforation/abscess. Moderate ascites with suspected peritonitis. Cirrhosis. Stable RIGHT lower lobe mass previously characterized as primary lung neoplasm on CT chest November 14, 2016. Slightly increasing small LEFT pleural effusion. Acute findings discussed with and reconfirmed by Dr.ANTHONY ALLEN on 12/03/2016 at 7:07 pm. Electronically Signed   By: CElon AlasM.D.   On: 12/03/2016 19:08   Ir Sinus/fist Tube Chk-non Gi  Result Date: 12/21/2016 INDICATION: 71year old female with a history of ruptured appendicitis and percutaneous drainage. CT drain placed 12/04/2016 EXAM: FLUOROSCOPIC DRAIN INJECTION MEDICATIONS: The patient is currently admitted to the hospital and receiving intravenous antibiotics. The antibiotics were administered within an appropriate time frame prior to the initiation of the procedure. ANESTHESIA/SEDATION: None COMPLICATIONS: None PROCEDURE: Informed written consent was obtained from the patient after a thorough discussion of the procedural risks, benefits and alternatives. All questions were addressed. Sterile technique was utilized including caps, mask, sterile gowns, sterile gloves, sterile drape, hand hygiene and skin antiseptic. A timeout was performed prior to the initiation of the procedure. Patient positioned supine position on the fluoroscopy table. Scout images of the right lower abdomen performed. Contrast was injected through the indwelling drain with images stored. Drain was then flushed and attached to gravity drainage. Patient tolerated  procedure well and remained hemodynamically stable throughout. No complications were encountered and no significant blood loss. FINDINGS: Initial image demonstrates drain within the right lower quadrant. Injection of the drain demonstrates immediate filling of the tubular structure which is connected to the cecum. Small amount of contrast decompressed through the skin site along the tract of the drain. No abscess cavity identified. IMPRESSION: Status post drain injection of right lower quadrant in the region of prior ruptured appendicitis, confirming fistulous connection of the drainage catheter  with the appendix. It appears as though the ruptured appendix has closed around the drain, with the current drain acting as a cecostomy tube. These results were called by telephone at the time of interpretation on 12/21/2016 at 2:34 pm to Dr. Earnstine Regal , who verbally acknowledged these results. Signed, Dulcy Fanny. Earleen Newport, DO [facility] Radiology Electronically Signed   By: Corrie Mckusick D.O.   On: 12/21/2016 14:35   Ir US Guide Bx Asp/drain  Result Date: 12/21/2016 INDICATION: 71 year old female with axillary lymphadenopathy. EXAM: IR ULTRASOUND GUIDANCE MEDICATIONS: None. ANESTHESIA/SEDATION: None FLUOROSCOPY TIME:  None COMPLICATIONS: None PROCEDURE: Informed written consent was obtained from the patient after a thorough discussion of the procedural risks, benefits and alternatives. All questions were addressed. Maximal Sterile Barrier Technique was utilized including caps, mask, sterile gowns, sterile gloves, sterile drape, hand hygiene and skin antiseptic. A timeout was performed prior to the initiation of the procedure. Patient positioned supine position on the fluoroscopic table. Ultrasound images of the right axillary region were performed with images stored and sent to PACs. The patient was then prepped and draped in the usual sterile fashion. The skin and  subcutaneous tissues were generously infiltrated 1% lidocaine for local anesthesia. Multiple core biopsy were achieved using 18 gauge device. Images were stored. Tissue samples placed in the saline. Patient tolerated the procedure well and remained hemodynamically stable throughout. No complications were encountered and no significant blood loss. IMPRESSION: Status post ultrasound-guided biopsy of right axillary node. Tissue specimen sent to pathology for complete histopathologic analysis. Signed, Dulcy Fanny. Earleen Newport, DO [facility] Radiology Electronically Signed   By: Corrie Mckusick D.O.   On: 12/21/2016 15:14   Dg Chest Port 1 View  Result Date: 12/31/2016 CLINICAL DATA:  CHF. EXAM: PORTABLE CHEST 1 VIEW COMPARISON:  CT 12/30/2016.  Chest x-ray 12/30/2016. FINDINGS: Cardiomegaly with pulmonary vascular prominence and bilateral interstitial prominence. Interim partial clearing of bibasilar atelectasis. Bilateral pleural effusions again noted. No pneumothorax. IMPRESSION: 1. Cardiomegaly with pulmonary vascular prominence and bilateral interstitial prominence. Findings consistent with mild CHF. Bilateral pleural effusions again noted. Similar findings noted on prior exams. 2. Interim partial clearing of bibasilar atelectasis. Electronically Signed   By: Marcello Moores  Register   On: 12/31/2016 06:55   Dg Chest Portable 1 View  Result Date: 12/30/2016 CLINICAL DATA:  Shortness of breath since this morning.  Ex-smoker. EXAM: PORTABLE CHEST 1 VIEW COMPARISON:  12/19/2016. FINDINGS: Stable enlarged cardiac silhouette and prominent pulmonary vasculature and interstitial markings. Small to moderate-sized right pleural effusion with mild right basilar atelectasis with little change. Aortic arch calcifications. Unremarkable bones. IMPRESSION: Stable cardiomegaly and changes of congestive heart failure. Aortic atherosclerosis. Electronically Signed   By: Claudie Revering M.D.   On:  12/30/2016 12:13   Dg Chest Port 1 View  Result Date: 12/19/2016 CLINICAL DATA:  Respiratory distress EXAM: PORTABLE CHEST 1 VIEW COMPARISON:  December 18, 2016 FINDINGS: Central catheter tip is in the superior vena cava. No pneumothorax. There is airspace consolidation in each lower lobe with small pleural effusions bilaterally. There is cardiomegaly with mild pulmonary venous hypertension. No adenopathy. There is atherosclerotic calcification in the aorta. No bone lesions. IMPRESSION: Central catheter tip in superior vena cava. No pneumothorax. Findings indicative of a degree of congestive heart failure. Superimposed bibasilar pneumonia cannot be excluded radiographically. Pneumonia and alveolar edema may present concurrently. There is aortic atherosclerosis. Electronically Signed   By: Lowella Grip III M.D.   On: 12/19/2016  08:56   Dg Chest Port 1 View  Result Date: 12/18/2016 CLINICAL DATA:  Low oxygen saturation. EXAM: PORTABLE CHEST 1 VIEW COMPARISON:  12/17/2016.  CT 12/08/2016. FINDINGS: Right PICC line noted with tip projected over the right atrium. Cardiomegaly with pulmonary vascular prominence, bilateral interstitial prominence, bilateral pleural effusions. Findings consistent CHF. Interim slight progression from prior exam. Right infrahilar mass again noted. IMPRESSION: 1. Right PICC line in stable position. 2. Congestive heart failure bilateral pulmonary interstitial edema bilateral pleural effusions. Interstitial edema is progressed from prior exam. 3. Right infrahilar mass again noted. Electronically Signed   By: Marcello Moores  Register   On: 12/18/2016 14:00   Dg Chest Port 1 View  Result Date: 12/17/2016 CLINICAL DATA:  Status post bronchoscopy. EXAM: PORTABLE CHEST 1 VIEW COMPARISON:  CT chest 12/08/2016 FINDINGS: A right-sided PICC line is stable. The heart is enlarged. Atherosclerotic changes are present at the aortic arch. Mild pulmonary vascular congestion is present. Right greater than  left pleural effusions are noted. Bibasilar airspace disease is associated. IMPRESSION: 1. Cardiomegaly and mild pulmonary vascular congestion. 2. Persistent bilateral pleural effusions, right greater than left. 3. Partial collapse of the right lower lobe in part related to central soft tissue mass. 4. No radiographic evidence for complication. Electronically Signed   By: San Morelle M.D.   On: 12/17/2016 10:08   Dg Chest Port 1 View  Result Date: 12/05/2016 CLINICAL DATA:  Weakness and shortness of breath EXAM: PORTABLE CHEST 1 VIEW COMPARISON:  11/19/2016 FINDINGS: Moderate cardiomegaly with central vascular congestion. There are small bilateral pleural effusions and hazy bibasilar atelectasis or infiltrate. No pneumothorax. Atherosclerosis. IMPRESSION: 1. Cardiomegaly with central vascular congestion and small bilateral effusions 2. Hazy bibasilar atelectasis or infiltrates. Electronically Signed   By: Donavan Foil M.D.   On: 12/05/2016 15:29   Ct Image Guided Drainage By Percutaneous Catheter  Result Date: 12/14/2016 INDICATION: Postop pelvic fluid collection, status post perforated appendicitis, worsening white count EXAM: CT-GUIDED PELVIC FLUID COLLECTION DRAINAGE VIA A LEFT TRANS GLUTEAL APPROACH. MEDICATIONS: The patient is currently admitted to the hospital and receiving intravenous antibiotics. The antibiotics were administered within an appropriate time frame prior to the initiation of the procedure. ANESTHESIA/SEDATION: Fentanyl 200 mcg IV; Versed 3.0 mg IV Moderate Sedation Time:  21 minutes The patient was continuously monitored during the procedure by the interventional radiology nurse under my direct supervision. COMPLICATIONS: None. PROCEDURE: Informed written consent was obtained from the patient after a thorough discussion of the procedural risks, benefits and alternatives. All questions were addressed. Maximal Sterile Barrier Technique was utilized including caps, mask, sterile  gowns, sterile gloves, sterile drape, hand hygiene and skin antiseptic. A timeout was performed prior to the initiation of the procedure. Previous imaging reviewed. Patient positioned nearly prone. Noncontrast localization CT performed. The left trans gluteal approach to the cul-de-sac pelvic fluid collection was localized. Overlying skin marked. Under sterile conditions and local anesthesia, a 17 gauge 16.8 cm access needle was advanced from a left trans gluteal posterior oblique approach into the fluid collection. Needle position confirmed with CT. Guidewire inserted. Guidewire position confirmed with CT. Tract dilatation performed to insert a 10 [place] drain. Drain catheter positioned within the fluid collection. Position confirmed with CT. Catheter secured with a Prolene suture and connected to external suction bulb. Sterile dressing applied. No immediate complication. Patient tolerated the procedure well. IMPRESSION: Successful CT-guided pelvic fluid collection drain insertion via left trans gluteal approach. Electronically Signed   By: Jerilynn Mages.  Shick M.D.   On:  12/14/2016 16:36   Ct Image Guided Drainage By Percutaneous Catheter  Result Date: 12/05/2016 INDICATION: Periappendiceal abscess EXAM: CT GUIDED DRAINAGE OF RIGHT LOWER QUADRANT ABSCESS MEDICATIONS: The patient is currently admitted to the hospital and receiving intravenous antibiotics. The antibiotics were administered within an appropriate time frame prior to the initiation of the procedure. ANESTHESIA/SEDATION: Two mg IV Versed 75 mcg IV Fentanyl Moderate Sedation Time:  17 The patient was continuously monitored during the procedure by the interventional radiology nurse under my direct supervision. COMPLICATIONS: None immediate. TECHNIQUE: Informed written consent was obtained from the patient after a thorough discussion of the procedural risks, benefits and alternatives. All questions were addressed. Maximal Sterile Barrier Technique was utilized  including caps, mask, sterile gowns, sterile gloves, sterile drape, hand hygiene and skin antiseptic. A timeout was performed prior to the initiation of the procedure. PROCEDURE: The right lower quadrant was prepped with ChloraPrep in a sterile fashion, and a sterile drape was applied covering the operative field. A sterile gown and sterile gloves were used for the procedure. Local anesthesia was provided with 1% Lidocaine. Under CT guidance, an 18 gauge needle was advanced into the right lower quadrant abscess and removed over an Amplatz wire. Twelve [place] dilator followed by a 12 [place] drain were inserted. It was looped and string fixed then sewn to the skin. FINDINGS: Images document right lower quadrant 12 French drain placement into an abscess. IMPRESSION: Successful CT-guided right lower quadrant abscess 12 French drain. Electronically Signed   By: Marybelle Killings M.D.   On: 12/05/2016 09:10   Ct Chest Nodule Follow Up Low Dose W/o  Result Date: 12/08/2016 CLINICAL DATA:  71 year old female admitted with perforated appendicitis status post percutaneous drain placement for right lower quadrant abscess 4 days prior, presenting for follow-up of recently diagnosed right lower lobe lung mass. EXAM: CT CHEST WITHOUT CONTRAST TECHNIQUE: Multidetector CT imaging of the chest was performed following the standard protocol without IV contrast. COMPARISON:  11/14/2016 chest CT. 12/05/2016 chest radiograph. FINDINGS: Cardiovascular: Mild cardiomegaly. No residual significant pericardial effusion/ thickening. Left anterior descending, left circumflex and right coronary atherosclerosis. Atherosclerotic thoracic aorta. Stable ectasia of the ascending thoracic aorta with maximum diameter 4.2 cm. Stable dilated main pulmonary artery (4.1 cm diameter). Mediastinum/Nodes: No discrete thyroid nodules. Unremarkable esophagus. New bilateral axillary lymphadenopathy measuring up to the 1.2 cm on the right (series 2/ image 34) and  1.2 cm on the left (series 2/image 38). Mild right paratracheal adenopathy measuring up to the 1.2 cm (series 2/ image 59), stable. Mildly enlarged 1.2 cm subcarinal node (series 2/ image 71), stable. No additional pathologically enlarged mediastinal nodes. Stable mild right hilar lymphadenopathy, poorly delineated on this noncontrast scan. No gross left hilar adenopathy on this noncontrast scan. Lungs/Pleura: Small dependent bilateral pleural effusions, increased bilaterally since 11/14/2016. Associated mild smooth pleural thickening on the right without discrete pleural nodularity. No pneumothorax. Stable soft tissue density occlusion of the right lower lobe bronchus with complete right lower lobe atelectasis, suspicious for obstructing central right lower lobe lung mass, poorly delineated on this noncontrast chest CT, not appreciably changed since 11/14/2016. The right middle lobe bronchus also appears occluded with soft tissue density with worsened significant right middle lobe atelectasis. Mild centrilobular and paraseptal emphysema with mild diffuse bronchial wall thickening. Irregular 2.1 x 1.4 cm solid medial left upper lobe pulmonary nodule (series 5/ image 52), stable since 11/14/2016. Mild interlobular septal thickening throughout both lungs. Mild-to-moderate compressive atelectasis in the dependent left lower lobe. Upper abdomen:  Relative hypertrophy of the lateral segment left liver lobe with diffuse liver surface irregularity, compatible with cirrhosis. Small volume ascites in the visualized upper abdomen. Musculoskeletal: No aggressive appearing focal osseous lesions. Mild thoracic spondylosis. IMPRESSION: 1. Persistent soft tissue density occlusion of the right lower and right middle lobe bronchi with complete right lower lobe atelectasis and worsening right middle lobe atelectasis. Primary bronchogenic carcinoma of the central right lower lung is the diagnosis of exclusion. 2. Stable right hilar,  subcarinal and right paratracheal lymphadenopathy. New symmetric mild axillary lymphadenopathy. Nodal metastases not excluded. 3. Irregular 2.1 cm solid medial left upper lobe pulmonary nodule, stable since 11/14/2016, suspicious for contralateral pulmonary metastasis versus synchronous primary bronchogenic carcinoma . 4. Spectrum of findings suggestive of mild congestive heart failure including mild cardiomegaly, small dependent bilateral pleural effusions and diffuse mild interlobular septal thickening characteristic of mild pulmonary edema. 5. Mild emphysema with mild diffuse bronchial wall thickening, suggesting COPD . 6. Cirrhosis.  Small volume ascites in the upper abdomen. 7. Aortic atherosclerosis.  Three-vessel coronary atherosclerosis. 8. 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 Recommend annual imaging followup by CTA or MRA. This recommendation follows 2010 ACCF/AHA/AATS/ACR/ASA/SCA/SCAI/SIR/STS/SVM Guidelines for the Diagnosis and Management of Patients with Thoracic Aortic Disease. Circulation. 2010; 121: E527-P824. 9. Stable prominently dilated main pulmonary artery, suggesting pulmonary arterial hypertension. Electronically Signed   By: Ilona Sorrel M.D.   On: 12/08/2016 11:30       Subjective: Patient with improvement of her dyspnea, no wheezing or cough. Patient concerned for abdominal drain.   Discharge Exam: Vitals:   12/31/16 2200 01/01/17 0605  BP: (!) 175/64 (!) 145/65  Pulse: 66 62  Resp: 18 18  Temp: 98.1 F (36.7 C) 97.5 F (36.4 C)   Vitals:   12/31/16 2200 01/01/17 0605 01/01/17 0700 01/01/17 0948  BP: (!) 175/64 (!) 145/65    Pulse: 66 62    Resp: 18 18    Temp: 98.1 F (36.7 C) 97.5 F (36.4 C)    TempSrc: Oral Oral    SpO2: 98% 100%  98%  Weight:   109 kg (240 lb 4.8 oz)   Height:        General: Pt is alert, awake, not in acute distress Cardiovascular: RRR, S1/S2 +, no rubs, no gallops Respiratory: CTA bilaterally, no  wheezing, no rhonchi. Mild decreased ventilation and bibasilar rales.  Abdominal: Soft, NT, ND, bowel sounds + Extremities: no edema, no cyanosis    The results of significant diagnostics from this hospitalization (including imaging, microbiology, ancillary and laboratory) are listed below for reference.     Microbiology: No results found for this or any previous visit (from the past 240 hour(s)).   Labs: BNP (last 3 results)  Recent Labs  12/30/16 1209 12/31/16 0103  BNP 626.5* 2,353.6*   Basic Metabolic Panel:  Recent Labs Lab 12/26/16 0445 12/30/16 1209 12/30/16 1913 12/31/16 0103 01/01/17 0548  NA 133* 138  --  138 137  K 4.0 4.6  --  4.6 4.5  CL 86* 95*  --  92* 92*  CO2 40* 37*  --  36* 37*  GLUCOSE 82 96  --  136* 73  BUN 31* 26*  --  31* 31*  CREATININE 0.89 0.76  --  1.00 0.90  CALCIUM 8.5* 8.5*  --  8.8* 8.6*  MG  --   --  1.8  --   --    Liver Function Tests:  Recent Labs Lab 12/30/16 1209  12/31/16 0103  AST 14* 18  ALT 14 15  ALKPHOS 55 52  BILITOT 0.6 0.7  PROT 5.9* 6.1*  ALBUMIN 3.1* 3.0*   No results for input(s): LIPASE, AMYLASE in the last 168 hours. No results for input(s): AMMONIA in the last 168 hours. CBC:  Recent Labs Lab 12/30/16 1209  WBC 10.5  NEUTROABS 8.8*  HGB 9.7*  HCT 30.2*  MCV 98.1  PLT 257   Cardiac Enzymes:  Recent Labs Lab 12/30/16 1913 12/31/16 0103 12/31/16 0712  TROPONINI <0.03 <0.03 <0.03   BNP: Invalid input(s): POCBNP CBG: No results for input(s): GLUCAP in the last 168 hours. D-Dimer No results for input(s): DDIMER in the last 72 hours. Hgb A1c No results for input(s): HGBA1C in the last 72 hours. Lipid Profile No results for input(s): CHOL, HDL, LDLCALC, TRIG, CHOLHDL, LDLDIRECT in the last 72 hours. Thyroid function studies  Recent Labs  12/30/16 1913  TSH 2.356   Anemia work up No results for input(s): VITAMINB12, FOLATE, FERRITIN, TIBC, IRON, RETICCTPCT in the last 72  hours. Urinalysis    Component Value Date/Time   COLORURINE YELLOW 12/04/2016 0425   APPEARANCEUR HAZY (A) 12/04/2016 0425   LABSPEC >1.046 (H) 12/04/2016 0425   PHURINE 5.0 12/04/2016 0425   GLUCOSEU NEGATIVE 12/04/2016 0425   HGBUR NEGATIVE 12/04/2016 0425   BILIRUBINUR NEGATIVE 12/04/2016 0425   KETONESUR NEGATIVE 12/04/2016 0425   PROTEINUR NEGATIVE 12/04/2016 0425   NITRITE NEGATIVE 12/04/2016 0425   LEUKOCYTESUR NEGATIVE 12/04/2016 0425   Sepsis Labs Invalid input(s): PROCALCITONIN,  WBC,  LACTICIDVEN Microbiology No results found for this or any previous visit (from the past 240 hour(s)).   Time coordinating discharge: 45 minutes  SIGNED:   Tawni Millers, MD  [facility] 01/01/2017, 10:53 AM Pager   If 7PM-7AM, please contact night-coverage [email or web address] Password [email or web address]

## 2017-01-01 NOTE — NC FL2 (Signed)
[place] LEVEL OF CARE SCREENING TOOL     IDENTIFICATION  Patient Name: Alisha Fisher Birthdate: [birth year] Sex: female Admission Date (Current Location): 12/30/2016  [facility] and [place] Number:  Herbalist and Address:  [facility],  [address], [place]      Provider Number: [id number]  Attending Physician Name and Address:  Tawni Millers, *  Relative Name and Phone Number:       Current Level of Care: Hospital Recommended Level of Care: [place] Prior Approval Number:    Date Approved/Denied:   PASRR Number: [id number]  Discharge Plan: SNF    Current Diagnoses: Patient Active Problem List   Diagnosis Date Noted  . CHF (congestive heart failure) ([place]) 12/30/2016  . Respiratory insufficiency 12/30/2016  . Squamous cell lung cancer, right ([place]) 12/25/2016  . Acute respiratory failure with hypoxia ([place]) 12/25/2016  . Rash and nonspecific skin eruption   . Acute pain of left thigh   . Primary cancer of right middle lobe of lung ([place]) 12/20/2016  . Acute on chronic respiratory failure with hypoxia and hypercapnia ([place]) 12/18/2016  . Essential hypertension 12/18/2016  . Anemia 12/18/2016  . Endobronchial mass   . Hypokalemia 12/16/2016  . Hypomagnesemia 12/16/2016  . Intraperitoneal abscess s/p perc drainage 12/14/2016 12/15/2016  . Acute appendicitis with perforation and peritoneal abscess 12/03/2016  . Obesity 05/25/2012  . Smoker 05/25/2012  . COPD (chronic obstructive pulmonary disease) ([place]) 05/25/2012  . Dyspnea 04/11/2012    Orientation RESPIRATION BLADDER Height & Weight     Self, Time, Situation, Place  O2 (Nasal Cannula ) Continent Weight: 240 lb 4.8 oz (109 kg) Height:  '5\' 7"'$  (170.2 cm)  BEHAVIORAL SYMPTOMS/MOOD NEUROLOGICAL BOWEL NUTRITION STATUS      Continent Diet  AMBULATORY STATUS COMMUNICATION OF NEEDS Skin   Extensive Assist Verbally Other (Comment)                        Personal Care Assistance Level of Assistance  Bathing, Feeding, Dressing Bathing Assistance: Maximum assistance Feeding assistance: Independent Dressing Assistance: Maximum assistance     Functional Limitations Info             SPECIAL CARE FACTORS FREQUENCY  PT (By licensed PT), OT (By licensed OT)     PT Frequency: 5x/week OT Frequency: 5x/week            Contractures Contractures Info: Not present    Additional Factors Info  Code Status, Allergies Code Status Info: Full  Allergies Info: Ciprofloxacin; Flagyl Metronidazole; Zosyn Piperacillin Sod-tazobactam So;            Current Medications (01/01/2017):  This is the current hospital active medication list Current Facility-Administered Medications  Medication Dose Route Frequency Provider Last Rate Last Dose  . 0.9 %  sodium chloride infusion  250 mL Intravenous PRN Benito Mccreedy, MD      . acetaminophen (TYLENOL) tablet 650 mg  650 mg Oral Q6H PRN Benito Mccreedy, MD      . albuterol (PROVENTIL) (2.5 MG/3ML) 0.083% nebulizer solution 2.5 mg  2.5 mg Nebulization Q6H PRN Benito Mccreedy, MD      . arformoterol (BROVANA) nebulizer solution 15 mcg  15 mcg Nebulization Q12H Benito Mccreedy, MD   15 mcg at 01/01/17 0948  . budesonide (PULMICORT) nebulizer solution 0.5 mg  0.5 mg Nebulization Q12H Benito Mccreedy, MD   0.5 mg at 01/01/17 0948  . cholecalciferol (VITAMIN D)  tablet 5,000 Units  5,000 Units Oral Daily Benito Mccreedy, MD   5,000 Units at 01/01/17 0901  . diphenhydrAMINE (BENADRYL) capsule 25 mg  25 mg Oral Q4H PRN Rhetta Mura Schorr, NP   25 mg at 01/01/17 0900  . enoxaparin (LOVENOX) injection 50 mg  50 mg Subcutaneous Q24H Dara Hoyer, RPH   50 mg at 12/31/16 2145  . famotidine (PEPCID) tablet 40 mg  40 mg Oral BID Benito Mccreedy, MD   40 mg at 01/01/17 0900  . furosemide (LASIX) injection 40 mg  40 mg Intravenous Daily Tawni Millers, MD   40 mg at 01/01/17 0902  .  gabapentin (NEURONTIN) capsule 100 mg  100 mg Oral TID Benito Mccreedy, MD   100 mg at 01/01/17 0901  . hydrALAZINE (APRESOLINE) injection 10 mg  10 mg Intravenous Q4H PRN Tawni Millers, MD      . hydrOXYzine (ATARAX/VISTARIL) tablet 25 mg  25 mg Oral TID PRN Benito Mccreedy, MD   25 mg at 12/31/16 1409  . losartan (COZAAR) tablet 50 mg  50 mg Oral Daily Benito Mccreedy, MD   50 mg at 01/01/17 0902  . MEDLINE mouth rinse  15 mL Mouth Rinse BID Benito Mccreedy, MD   15 mL at 01/01/17 0902  . metoprolol (LOPRESSOR) tablet 150 mg  150 mg Oral BID Benito Mccreedy, MD   150 mg at 01/01/17 0901  . nicotine (NICODERM CQ - dosed in mg/24 hours) patch 21 mg  21 mg Transdermal Daily Benito Mccreedy, MD   21 mg at 01/01/17 0902  . ondansetron (ZOFRAN) injection 4 mg  4 mg Intravenous Q6H PRN Benito Mccreedy, MD      . ondansetron (ZOFRAN-ODT) disintegrating tablet 4 mg  4 mg Oral Q6H PRN Benito Mccreedy, MD      . polyethylene glycol (MIRALAX / GLYCOLAX) packet 17 g  17 g Oral Daily Benito Mccreedy, MD   17 g at 12/31/16 0944  . sodium chloride flush (NS) 0.9 % injection 3 mL  3 mL Intravenous Q12H Benito Mccreedy, MD   3 mL at 01/01/17 0903  . sodium chloride flush (NS) 0.9 % injection 3 mL  3 mL Intravenous PRN Benito Mccreedy, MD         Discharge Medications: Please see discharge summary for a list of discharge medications.  Relevant Imaging Results:  Relevant Lab Results:   Additional Information SS#: [id number]  Burnis Medin, LCSW

## 2017-01-02 ENCOUNTER — Ambulatory Visit
Admission: RE | Admit: 2017-01-02 | Discharge: 2017-01-02 | Disposition: A | Discharge status: Home / Self Care | Payer: Medicare Other | Source: Ambulatory Visit | Attending: Radiation Oncology | Admitting: Radiation Oncology

## 2017-01-02 DIAGNOSIS — Z510 Encounter for antineoplastic radiation therapy: Secondary | ICD-10-CM | POA: Diagnosis not present

## 2017-01-02 DIAGNOSIS — C342 Malignant neoplasm of middle lobe, bronchus or lung: Secondary | ICD-10-CM | POA: Diagnosis present

## 2017-01-03 ENCOUNTER — Ambulatory Visit
Admission: RE | Admit: 2017-01-03 | Discharge: 2017-01-03 | Disposition: A | Discharge status: Home / Self Care | Payer: Medicare Other | Source: Ambulatory Visit | Attending: Radiation Oncology | Admitting: Radiation Oncology

## 2017-01-03 DIAGNOSIS — Z510 Encounter for antineoplastic radiation therapy: Secondary | ICD-10-CM | POA: Diagnosis not present

## 2017-01-07 ENCOUNTER — Encounter: Payer: Self-pay | Admitting: Acute Care

## 2017-01-07 ENCOUNTER — Ambulatory Visit (INDEPENDENT_AMBULATORY_CARE_PROVIDER_SITE_OTHER): Payer: Medicare Other | Admitting: Acute Care

## 2017-01-07 ENCOUNTER — Ambulatory Visit (INDEPENDENT_AMBULATORY_CARE_PROVIDER_SITE_OTHER)
Admission: RE | Admit: 2017-01-07 | Discharge: 2017-01-07 | Disposition: A | Discharge status: Home / Self Care | Payer: Medicare Other | Source: Ambulatory Visit | Attending: Acute Care | Admitting: Acute Care

## 2017-01-07 VITALS — BP 156/62 | HR 78

## 2017-01-07 DIAGNOSIS — I5033 Acute on chronic diastolic (congestive) heart failure: Secondary | ICD-10-CM | POA: Diagnosis not present

## 2017-01-07 DIAGNOSIS — C3491 Malignant neoplasm of unspecified part of right bronchus or lung: Secondary | ICD-10-CM

## 2017-01-07 DIAGNOSIS — J9601 Acute respiratory failure with hypoxia: Secondary | ICD-10-CM | POA: Diagnosis not present

## 2017-01-07 DIAGNOSIS — J441 Chronic obstructive pulmonary disease with (acute) exacerbation: Secondary | ICD-10-CM

## 2017-01-07 MED ORDER — LORATADINE 10 MG PO TABS: 10.0000 mg | ORAL_TABLET | Freq: Every day | ORAL | 11 refills | Status: DC

## 2017-01-07 NOTE — Assessment & Plan Note (Addendum)
Continue oxygen at 2 L [place] Saturation goals are 88-92% Incentive spirometer Q 1 hour while awake Continue Brovana and Pulmicort nebs Q12 hours for now. Continue using albuterol as needed for breakthrough shortness of breath Will transition back to Advair inhaler if able in future CXR today F/U effusions Continue Lasix Place order for smaller oxygen tanks with DME Follow up in 1 month with Dr. Chase Caller or NP Please contact office for sooner follow up if symptoms do not improve or worsen or seek emergency care

## 2017-01-07 NOTE — Assessment & Plan Note (Signed)
Wear oxygen at 2 L [place] Oxygen saturation goals are 88-92%

## 2017-01-07 NOTE — Assessment & Plan Note (Signed)
Squamous cell cancer diagnosed this admission 11/2016 Stage 4 disease Unable to give systemic chemo due to abdominal infection Completed 10 radiation treatments Plan: Follow up with radiation oncology as scheduled

## 2017-01-07 NOTE — Progress Notes (Signed)
History of Present Illness Alisha Fisher is a 71 y.o. female former smoker ( quit 11/01/2016)  with COPD on home oxygen,, Diastolic heart failure, and squamous cell lung cancer, stage 4,  treated with radiation treatments x 10.She was seen by Dr. Vaughan Browner as an inpatient .    4/9/2018Hospital Follow Up: Pt. Presents for follow up after admission form 3/5 through 12/30/16 at [facility] health. She was admitted for acute on chronic respiratory failure  2/2 decompensated acute on chronic diastolic heart failure and pulmonary edema. Additionally she had right lung collapse secondary to post obstructive pneumonia, with associated bilateral pleural effusions.and perforated appendix. She was diagnosed with squamous cell carcinoma this admission. Treatment included bronchospopy,  antibiotic therapy, lasix for diuresis,BIPAP therapy,schedule BD treatments and IV steroids tapered to prednisone taper.Her respiratory status returned to baseline. Negative fluid balance was achieved. She is receiving radiation treatments for her lung cancer ( Unable to receive chemo/systemic treatment due to infection in her abdomen.).She was discharged to rehab ( [facility]) 12/30/2016 where she is continuing at present with daily therapy for re-conditioning.She is deconditioned after her hospitalization.She presents today on nasal oxygen at 2 L.Saturations are 94%. She states her breathing is at baseline.She is compliant with her Brovana and Pulmicort nebulizer treatments twice daily.She does use her albuterol rescue for breakthrough shortness of breath. She states she is compliant with her lasix.She has a saturation monitor and knows her goal oxygen saturations ar 88-92%. She denies and fever,chest pain, orthopnea or hemoptysis.    Tests 4.9.2018>> CXR IMPRESSION: Enlargement of cardiac silhouette with aortic atherosclerosis.  Bibasilar pleural effusions with RIGHT basilar atelectasis and minimal LEFT mid lung subsegmental  atelectasis.  Overall probably little interval change.  IMAGING/STUDIES: PFT 04/22/12:FVC 1.90 L (67%) FEV1 1.14 L (47%) FEV1/FVC 0.60 FEF 25-75 0.44 L (70%) negative bronchodilator response TLC 4.09 L (75%) RV 100% ERV 30% DLCO uncorrected 63%  CT CHEST W/O 12/08/16: Endobronchial obstruction with cut off in the right lower lobe and some evidence of obstruction of the right middle lobe bronchus. Subsequent collapse versus consolidation of right lower lobe and associated pleural effusion. Small left pleural effusion as well. Left-sided nodule which is spiculated. Pathologically enlarged subcarinal as well as one precarinal lymph node. Apical predominant emphysematous changes noted.  3/19 bronchoscopy w/ endobronchial FNA of bronchus intermedius mass, brushing of RMSB & EBUS w/ FNA  12/22/2016: Echo Left ventricle: The cavity size was normal. Systolic function was   vigorous. The estimated ejection fraction was in the range of 65%   to 70%. Wall motion was normal; there were no regional wall   motion abnormalities. Features are consistent with a pseudonormal   left ventricular filling pattern, with concomitant abnormal   relaxation and increased filling pressure (grade 2 diastolic   dysfunction). Doppler parameters are consistent with elevated   ventricular end-diastolic filling pressure. - Aortic valve: Trileaflet; mildly thickened, moderately calcified   leaflets. Transvalvular velocity was minimally increased. There   was mild stenosis. There was mild regurgitation. Mean gradient   (S): 8 mm Hg. Peak gradient (S): 17 mm Hg. Valve area (VTI): 1.86   cm^2. Valve area (Vmax): 1.97 cm^2. Valve area (Vmean): 1.95   cm^2. - Left atrium: The atrium was mildly dilated. - Right ventricle: The cavity size was normal. Wall thickness was   normal. Systolic function was normal. - Right atrium: The atrium was normal in size. - Tricuspid valve: There was mild regurgitation. - Pulmonary  arteries: Systolic pressure was within the normal  range. - Inferior vena cava: The vessel was normal in size. - Pericardium, extracardiac: A trivial pericardial effusion was   identified. Features were not consistent with tamponade   physiology. There was a large left pleural effusion.   MICROBIOLOGY: Abscess Culture 3/6: Enterococcus faecalis  ANTIBIOTICS: Zosyn 3/5 - 3/8 Cipro 3/8 (x1 dose) Flagyl 3/8 - 3/10 Vancomycin 3/9 >>> Invanz 3/10 >>>  ASSESSMENT / PLAN: Per pulmonary as inpatient New diagnosis of squamous cell ca Severe COPD on home O2. Obesity  Probable OSA.  Tobacco abuse  Decompensation earlier this week is likely due to combination of lung collapse, post obstructive PNA, sedating meds, pulmonary edema. She continues to improve.  - Encourage patient to use Bipap at night - Continue lasix for diuresis - Avoid sedating meds - Ok to stop antiobiotics if she does not need for intrabdominal infection. Pct is low and she has already received > 14 days of therapy. - Continue nebs, can change solumedrol to 60 mg prednisone and start taper by 10 mg every 2 days. - Onc on board. Appreciate reccs. Will need to consult to radiation oncology and IR if they can biopsy axillary LNs. Past medical hx Past Medical History:  Diagnosis Date  . COPD, severe ([place])   . HTN (hypertension)   . Pneumonia   . Pulmonary emphysema ([place])   . SOB (shortness of breath)      Past surgical hx, Family hx, Social hx all reviewed.  Current Outpatient Prescriptions on File Prior to Visit  Medication Sig  . acetaminophen (TYLENOL) 325 MG tablet Take 2 tablets (650 mg total) by mouth every 6 (six) hours as needed for mild pain (breakthrough pain).  Marland Kitchen albuterol (PROVENTIL HFA;VENTOLIN HFA) 108 (90 Base) MCG/ACT inhaler Inhale 2 puffs into the lungs every 6 (six) hours as needed for wheezing or shortness of breath.  Marland Kitchen arformoterol (BROVANA) 15 MCG/2ML NEBU Take 2 mLs (15 mcg total) by  nebulization every 12 (twelve) hours.  . budesonide (PULMICORT) 0.5 MG/2ML nebulizer solution Take 2 mLs (0.5 mg total) by nebulization every 12 (twelve) hours.  . Cholecalciferol (VITAMIN D PO) Take 5,000 Units by mouth daily.   Marland Kitchen escitalopram (LEXAPRO) 10 MG tablet Take 10 mg by mouth daily.  . famotidine (PEPCID) 40 MG tablet Take 1 tablet (40 mg total) by mouth 2 (two) times daily.  . furosemide (LASIX) 40 MG tablet Take 1 tablet (40 mg total) by mouth 2 (two) times daily. To be adjusted at SNF based on daily weights and renal function  . gabapentin (NEURONTIN) 100 MG capsule Take 1 capsule (100 mg total) by mouth 3 (three) times daily.  . hydrOXYzine (ATARAX/VISTARIL) 25 MG tablet Take 25 mg by mouth 3 (three) times daily as needed for anxiety.  Marland Kitchen losartan (COZAAR) 100 MG tablet Take 1 tablet (100 mg total) by mouth daily.  . metoprolol 75 MG TABS Take 150 mg by mouth 2 (two) times daily.  . nicotine (NICODERM CQ - DOSED IN MG/24 HOURS) 21 mg/24hr patch Place 21 mg onto the skin daily.  . ondansetron (ZOFRAN-ODT) 4 MG disintegrating tablet Take 1 tablet (4 mg total) by mouth every 6 (six) hours as needed for nausea.  . polyethylene glycol (MIRALAX / GLYCOLAX) packet Take 17 g by mouth daily. Hold for diarrhea  . potassium chloride SA (K-DUR,KLOR-CON) 20 MEQ tablet Take 1 tablet (20 mEq total) by mouth daily.   No current facility-administered medications on file prior to visit.      Allergies  Allergen Reactions  . Ciprofloxacin Rash    Rash possibly caused by Cipro?  . Flagyl [Metronidazole] Rash    Rash possibly caused by Flagyl?  Marland Kitchen Zosyn [Piperacillin Sod-Tazobactam So] Itching and Rash    Has patient had a PCN reaction causing immediate rash, facial/tongue/throat swelling, SOB or lightheadedness with hypotension: No Has patient had a PCN reaction causing severe rash involving mucus membranes or skin necrosis: No Has patient had a PCN reaction that required hospitalization: No Has  patient had a PCN reaction occurring within the last 10 years: Yes If all of the above answers are "NO", then may proceed with Cephalosporin use.     Review Of Systems:  Constitutional:   No  weight loss, night sweats,  Fevers, chills, fatigue, or  lassitude.  HEENT:   No headaches,  Difficulty swallowing,  Tooth/dental problems, or  Sore throat,                No sneezing, itching, ear ache, nasal congestion, + post nasal drip,   CV:  No chest pain,  Orthopnea, PND, + swelling in lower extremities, no nasarca, dizziness, palpitations, syncope.   GI  No heartburn, indigestion, abdominal pain, nausea, vomiting, diarrhea, change in bowel habits, loss of appetite, bloody stools.   Resp: + shortness of breath with exertion not at rest.  + excess mucus ( yellow from allergies), no productive cough,  occasional  non-productive cough,  No coughing up of blood.  No change in color of mucus.  No wheezing.  No chest wall deformity  Skin: no rash or lesions.  GU: no dysuria, change in color of urine, no urgency or frequency.  No flank pain, no hematuria   MS:  No joint pain or swelling.  No decreased range of motion.  No back pain.  Psych:  No change in mood or affect. No depression or anxiety.  No memory loss.   Vital Signs BP (!) 156/62 (BP Location: Right Arm, Cuff Size: Normal)   Pulse 78   SpO2 94%    Physical Exam:  General- No distress,  A&Ox3, pleasant deconditioned female in wheel chair ENT: No sinus tenderness, TM clear, pale nasal mucosa, no oral exudate,+ post nasal drip, no LAN Cardiac: S1, S2, regular rate and rhythm, no murmur Chest: No wheeze/ rales/ dullness; no accessory muscle use, no nasal flaring, no sternal retractions, diminished per bases bilaterally Abd.: Soft Non-tender, obese Ext: No clubbing cyanosis, 3+ edema Neuro:deconditioned, MAE x 4 Alert and oriented x 3. Skin: No rashes, warm and dry, tight over lower extremities. Psych: normal mood and behavior, at  times anxious   Assessment/Plan  COPD (chronic obstructive pulmonary disease) (HCC) Continue oxygen at 2 L [place] Saturation goals are 88-92% Incentive spirometer Q 1 hour while awake Continue Brovana and Pulmicort nebs Q12 hours for now. Continue using albuterol as needed for breakthrough shortness of breath Will transition back to Advair inhaler if able in future CXR today F/U effusions Continue Lasix Place order for smaller oxygen tanks with DME Follow up in 1 month with Dr. Chase Caller or NP Please contact office for sooner follow up if symptoms do not improve or worsen or seek emergency care   Acute respiratory failure with hypoxia ([place]) Wear oxygen at 2 L [place] Oxygen saturation goals are 88-92%  Squamous cell lung cancer, right ([place]) Squamous cell cancer diagnosed this admission 11/2016 Stage 4 disease Unable to give systemic chemo due to abdominal infection Completed 10 radiation treatments Plan: Follow up with  radiation oncology as scheduled  CHF (congestive heart failure) (HCC) EF 95-18% Grade 2 diastolic dysfunction Follow up with cardiology for management of heart failure 2-3 + pitting edema lower extremities. Plan: Referral to Dr. Pernell Dupre ( He has seen patient in the past) for management of heart failure Continue lasix as ordered by primary    Magdalen Spatz, NP 01/07/2017  11:53 PM

## 2017-01-07 NOTE — Patient Instructions (Addendum)
It is nice to meet you today. We will refer you to cardiology, Dr. Pernell Dupre ( She has seen him in the past.)  Continue your Pulmicort and Brovana neb treatments as you have been doing. Continue your oxygen at 2L  . CXR today We will prescribe claritin 10 mg once daily. Delsym cough syrup 1 teaspoon every 12 hours as needed for cough. Saturation goal are 88-92% Follow up in 1 month with Dr. Chase Caller or NP. Follow up with your new PCP . Follow up with radiation oncology on 4/19 as is scheduled. Please contact office for sooner follow up if symptoms do not improve or worsen or seek emergency care

## 2017-01-07 NOTE — Assessment & Plan Note (Addendum)
EF 29-51% Grade 2 diastolic dysfunction Follow up with cardiology for management of heart failure 2-3 + pitting edema lower extremities. Plan: Referral to Dr. Pernell Dupre ( He has seen patient in the past) for management of heart failure Continue lasix as ordered by primary

## 2017-01-08 ENCOUNTER — Other Ambulatory Visit: Payer: Self-pay

## 2017-01-08 DIAGNOSIS — J9621 Acute and chronic respiratory failure with hypoxia: Secondary | ICD-10-CM

## 2017-01-08 DIAGNOSIS — J9622 Acute and chronic respiratory failure with hypercapnia: Principal | ICD-10-CM

## 2017-01-15 ENCOUNTER — Other Ambulatory Visit: Payer: Self-pay | Admitting: Hematology & Oncology

## 2017-01-15 DIAGNOSIS — C342 Malignant neoplasm of middle lobe, bronchus or lung: Secondary | ICD-10-CM

## 2017-01-17 ENCOUNTER — Ambulatory Visit
Admission: RE | Admit: 2017-01-17 | Discharge: 2017-01-17 | Disposition: A | Discharge status: Home / Self Care | Payer: Medicare Other | Source: Ambulatory Visit | Attending: General Surgery | Admitting: General Surgery

## 2017-01-17 ENCOUNTER — Ambulatory Visit
Admission: RE | Admit: 2017-01-17 | Discharge: 2017-01-17 | Disposition: A | Discharge status: Home / Self Care | Payer: Medicare Other | Source: Ambulatory Visit | Attending: Student | Admitting: Student

## 2017-01-17 ENCOUNTER — Other Ambulatory Visit: Payer: Self-pay | Admitting: General Surgery

## 2017-01-17 DIAGNOSIS — K3533 Acute appendicitis with perforation and localized peritonitis, with abscess: Secondary | ICD-10-CM

## 2017-01-17 HISTORY — PX: IR RADIOLOGIST EVAL & MGMT: IMG5224

## 2017-01-17 MED ORDER — IOPAMIDOL (ISOVUE-300) INJECTION 61%: 100.0000 mL | Freq: Once | INTRAVENOUS | Status: DC | PRN

## 2017-01-17 NOTE — Progress Notes (Signed)
Patient ID: Alisha Fisher, female   DOB: 03-12-1946, 71 y.o.   MRN: [id number]         Chief Complaint: Percutaneous drainage catheter evaluation and management  Referring Physician(s): Hassell Done  History of Present Illness: Alisha Fisher is a 71 y.o. female with past medical history significant for COPD and hypertension who underwent percutaneous drainage catheter placement on 3/6S 2018 for ruptured appendicitis. Patient presents to the interventional radiology drain clinic for percutaneous transcatheter evaluation and management.  Patient has not been maintaining diligent records regarding the drainage catheter output, however reports there has been little to no output for the past several days. The patient is not currently flushing the percutaneous drainage catheter.  Patient complains of her catheter-related pain but otherwise without complaint. No fever or chills.  Past Medical History:  Diagnosis Date  . COPD, severe ([place])   . HTN (hypertension)   . Pneumonia   . Pulmonary emphysema ([place])   . SOB (shortness of breath)     Past Surgical History:  Procedure Laterality Date  . CATARACT EXTRACTION    . ENDOBRONCHIAL ULTRASOUND Bilateral 12/17/2016   Procedure: ENDOBRONCHIAL ULTRASOUND;  Surgeon: Javier Glazier, MD;  Location: [facility];  Service: Cardiopulmonary;  Laterality: Bilateral;  . IR GENERIC HISTORICAL  12/21/2016   IR SINUS/FIST TUBE CHK-NON GI 12/21/2016 [facility]  . IR GENERIC HISTORICAL  12/21/2016   IR US GUIDE BX ASP/DRAIN 12/21/2016 Corrie Mckusick, DO [facility]    Allergies: Iohexol; Ciprofloxacin; Flagyl [metronidazole]; and Zosyn [piperacillin sod-tazobactam so]  Medications: Prior to Admission medications   Medication Sig Start Date End Date Taking? Authorizing Provider  acetaminophen (TYLENOL) 325 MG tablet Take 2 tablets (650 mg total) by mouth every 6 (six) hours as needed for mild pain (breakthrough pain). 12/25/16   Earnstine Regal, PA-C    albuterol (PROVENTIL HFA;VENTOLIN HFA) 108 (90 Base) MCG/ACT inhaler Inhale 2 puffs into the lungs every 6 (six) hours as needed for wheezing or shortness of breath.    Historical Provider, MD  arformoterol (BROVANA) 15 MCG/2ML NEBU Take 2 mLs (15 mcg total) by nebulization every 12 (twelve) hours. 12/25/16   Debbe Odea, MD  budesonide (PULMICORT) 0.5 MG/2ML nebulizer solution Take 2 mLs (0.5 mg total) by nebulization every 12 (twelve) hours. 12/25/16   Debbe Odea, MD  Cholecalciferol (VITAMIN D PO) Take 5,000 Units by mouth daily.     Historical Provider, MD  escitalopram (LEXAPRO) 10 MG tablet Take 10 mg by mouth daily.    Historical Provider, MD  famotidine (PEPCID) 40 MG tablet Take 1 tablet (40 mg total) by mouth 2 (two) times daily. 12/25/16   Debbe Odea, MD  furosemide (LASIX) 40 MG tablet Take 1 tablet (40 mg total) by mouth 2 (two) times daily. To be adjusted at SNF based on daily weights and renal function 12/25/16   Debbe Odea, MD  gabapentin (NEURONTIN) 100 MG capsule Take 1 capsule (100 mg total) by mouth 3 (three) times daily. 12/25/16   Debbe Odea, MD  hydrOXYzine (ATARAX/VISTARIL) 25 MG tablet Take 25 mg by mouth 3 (three) times daily as needed for anxiety.    Historical Provider, MD  loratadine (CLARITIN) 10 MG tablet Take 1 tablet (10 mg total) by mouth daily. 01/07/17   Magdalen Spatz, NP  losartan (COZAAR) 100 MG tablet Take 1 tablet (100 mg total) by mouth daily. 12/26/16   Debbe Odea, MD  metoprolol 75 MG TABS Take 150 mg by mouth 2 (two) times daily. 12/25/16  Debbe Odea, MD  nicotine (NICODERM CQ - DOSED IN MG/24 HOURS) 21 mg/24hr patch Place 21 mg onto the skin daily.    Historical Provider, MD  ondansetron (ZOFRAN-ODT) 4 MG disintegrating tablet Take 1 tablet (4 mg total) by mouth every 6 (six) hours as needed for nausea. 12/25/16   Earnstine Regal, PA-C  polyethylene glycol [facility] / GLYCOLAX) packet Take 17 g by mouth daily. Hold for diarrhea 12/26/16   Debbe Odea,  MD  potassium chloride SA (K-DUR,KLOR-CON) 20 MEQ tablet Take 1 tablet (20 mEq total) by mouth daily. 01/01/17   Mauricio Gerome Apley, MD     Family History  Problem Relation Age of Onset  . Allergies    . COPD Cousin   . Breast cancer Mother   . Diabetes Father   . Diabetes Son     Social History   Social History  . Marital status: Widowed    Spouse name: N/A  . Number of children: N/A  . Years of education: N/A   Occupational History  . retired    Social History Main Topics  . Smoking status: Former Smoker    Packs/day: 0.50    Years: 50.00    Types: Cigarettes    Quit date: 11/01/2016  . Smokeless tobacco: Never Used  . Alcohol use No  . Drug use: No  . Sexual activity: Not on file   Other Topics Concern  . Not on file   Social History Narrative   [facility] (12/10/16):   Patient's widower son and grandchildren moved in with her. Her husband passed years ago. Previously worked in [employer].    ECOG Status: 2 - Symptomatic, <50% confined to bed  Review of Systems: A 12 point ROS discussed and pertinent positives are indicated in the HPI above.  All other systems are negative.  Review of Systems  Vital Signs: BP (!) 183/87 (BP Location: Left Arm)   Pulse 84   Temp 98.2 F (36.8 C) (Oral)   SpO2 93%   Physical Exam  Abdominal:    Location of the patient's drainage catheter.     Imaging: Dg Chest 2 View  Result Date: 01/07/2017 CLINICAL DATA:  COPD exacerbation, cough, hypertension, former smoker, RIGHT lung cancer just recently finished radiation therapy EXAM: CHEST  2 VIEW COMPARISON:  12/31/2016 FINDINGS: Enlargement of cardiac silhouette. Atherosclerotic calcification aorta. Mediastinal contours and pulmonary vascularity otherwise normal. Small RIGHT pleural effusion and persistent RIGHT basilar atelectasis. Underlying emphysematous changes. Tiny LEFT pleural effusion blunts the posterior costophrenic angle. Minimal subsegmental atelectasis LEFT  mid lung. Remaining lungs clear. No pneumothorax or acute osseous findings. IMPRESSION: Enlargement of cardiac silhouette with aortic atherosclerosis. Bibasilar pleural effusions with RIGHT basilar atelectasis and minimal LEFT mid lung subsegmental atelectasis. Overall probably little interval change. Electronically Signed   By: Lavonia Dana M.D.   On: 01/07/2017 14:05   Ct Angio Chest Pe W And/or Wo Contrast  Result Date: 12/30/2016 CLINICAL DATA:  SOB/respiratory distress. Pt received 2 puff pf albuterol at nursing home and had a relief. BP 144/45 P 76, O2 95% on 3 liter pt is on 2 liters at home. Pt have hx of CA. EXAM: CT ANGIOGRAPHY CHEST WITH CONTRAST TECHNIQUE: Multidetector CT imaging of the chest was performed using the standard protocol during bolus administration of intravenous contrast. Multiplanar CT image reconstructions and MIPs were obtained to evaluate the vascular anatomy. CONTRAST:  100 mL of Isovue 370 intravenous contrast COMPARISON:  Current chest radiograph.  Chest CT, 12/08/2016. FINDINGS:  Cardiovascular: No evidence of pulmonary embolism. The heart is mildly enlarged. There are mild coronary artery calcifications. The thoracic aorta is normal in caliber. No dissection. Mild atherosclerotic plaque noted along the thoracic aorta and arch branch vessels. Mediastinum/Nodes: There is mediastinal adenopathy. A right peritracheal node in the superior mediastinum measures 15 mm in short axis. A right subcarinal node measures 2 cm short axis. No neck base or axillary masses or adenopathy. Abnormal soft tissue surrounds the right hilar structures. No left hilar mass or discrete enlarged lymph nodes. Lungs/Pleura: The bronchus intermedius is occluded. This may be from endobronchial secretions or focal mass. There is complete atelectasis of the right lower lobe and right middle lobe. 20 x 13 mm nodule in the left upper lobe, centered on image 27, series 12. No other discrete lung nodules. There is  bronchial wall thickening to the left lower lobe with dependent atelectasis. Minimal dependent atelectasis in the left upper lobe. Mild scarring or atelectasis is noted at the anterior base of the left upper lobe lingula. There is bilateral interstitial thickening and there changes of centrilobular and upper lobe paraseptal emphysema. Small bilateral pleural effusions are noted. There is no pneumothorax. Upper Abdomen: No acute findings. There is relative enlargement of the lateral segment of the left liver lobe with some surface nodularity. This suggests cirrhosis. Musculoskeletal: No fracture or acute finding. No osteoblastic or osteolytic lesions. Review of the MIP images confirms the above findings. IMPRESSION: 1. No evidence of a pulmonary embolism. 2. Mild mediastinal adenopathy.  This may be reactive or metastatic. 3. Occluded bronchus intermedius with atelectasis of the right middle and lower lobes. This may be due to endobronchial secretions or an endobronchial mass. 4. 20 x 13 mm nodule in the left upper lobe. 5. Dependent atelectasis in the left lower lobe and minimally in the left upper lobe. 6. Mild interstitial thickening, small effusions and cardiomegaly. Mild congestive heart failure suspected. 7. Emphysema. Electronically Signed   By: Lajean Manes M.D.   On: 12/30/2016 15:17   Ct Pelvis Wo Contrast  Result Date: 01/17/2017 CLINICAL DATA:  History of ruptured appendicitis, post CT-guided percutaneous drainage catheter placement on 12/04/2016. Note, patient also underwent left trans gluteal approach percutaneous drainage catheter placement on 12/14/2016 however this drainage catheter has been previously removed. Patient presents to the Interventional Radiology drain Clinic for percutaneous drainage catheter evaluation and management. Fluoroscopic drainage catheter injection performed 12/21/2016 demonstrated a fistula between the decompressed peri appendiceal abscess cavity and the residual  appendix and cecum. Patient has not been maintaining diligent records regarding the percutaneous drainage catheter output, however there patient reports little to no output for the past several days. The patient is not flushing the percutaneous drainage catheter. EXAM: CT PELVIS WITHOUT CONTRAST TECHNIQUE: Multidetector CT imaging of the pelvis was performed following the standard protocol without intravenous contrast. COMPARISON:  CT abdomen and pelvis -12/21/2016 ; 12/13/2016; 12/03/2016; CT-guided right lower quadrant periappendiceal abscess drainage catheter placement - 12/14/2016 ; CT-guided left trans gluteal approach percutaneous drainage catheter placement - 12/14/2016 ; fluoroscopic guided percutaneous drainage catheter injection - 12/21/2016 FINDINGS: Urinary Tract: Normal appearance of the urinary bladder given underdistention. Bowel: There has been continued retraction of right lower quadrant percutaneous drainage catheter, now with end coiled and locked external to the peritoneal cavity. Fortunately, there has been no recurrence of a peri-appendiceal abscess. Moderate colonic stool burden without evidence of enteric obstruction. No new definable/drainable fluid collection. Vascular/Lymphatic: Large amount of irregular calcified atherosclerotic plaque within the imaged caudal  aspect of the abdominal aorta and pelvic vasculature with suspected hemodynamically significant narrowing involving the right common iliac artery, incompletely evaluated on this noncontrast examination. Reproductive: Normal noncontrast appearance of the pelvic organs given age. Other:  Mild diffuse body wall edema. Musculoskeletal: No acute or aggressive osseous abnormalities. Moderate degenerative change of the left hip with joint space loss, subchondral sclerosis and geodes formation. Degenerative change of the image lower lumbar spine. IMPRESSION: 1. Malpositioned right lower quadrant percutaneous drainage catheter with end  coiled external to the peritoneal cavity. 2. Interval resolution of peri-appendiceal abscess without new definable/drainable fluid collection. 3. Aortic Atherosclerosis (ICD10-I70.0). Suspected hemodynamically significant narrowing involving the right common iliac artery. PLAN: Patient subsequently underwent percutaneous drainage catheter injection. Electronically Signed   By: Sandi Mariscal M.D.   On: 01/17/2017 11:42   Mr Jeri Cos IO Contrast  Result Date: 12/25/2016 CLINICAL DATA:  71 y/o F; lung cancer, evaluate for intracranial metastatic disease. EXAM: MRI HEAD WITHOUT AND WITH CONTRAST TECHNIQUE: Multiplanar, multiecho pulse sequences of the brain and surrounding structures were obtained without and with intravenous contrast. CONTRAST:  35m MULTIHANCE GADOBENATE DIMEGLUMINE 529 MG/ML IV SOLN COMPARISON:  None. FINDINGS: Moderate to severe motion artifact of all sequences due to patient confusion. Brain: No acute infarction, hemorrhage, hydrocephalus, extra-axial collection or mass lesion. No abnormal enhancement identified. Foci of T2 FLAIR hyperintense signal abnormality in subcortical and periventricular white matter are nonspecific but compatible with mild chronic microvascular ischemic changes. Mild brain parenchymal volume loss. Vascular: Normal flow voids. Skull and upper cervical spine: Normal marrow signal. Sinuses/Orbits: Small left maxillary sinus mucous retention cyst. Left mastoid air cell effusion. Bilateral intra-ocular lens replacement. Other: None. IMPRESSION: 1. Moderate to severe motion artifact. If clinically indicated, short interval follow-up when patient is better able to tolerate examination is recommended. 2. No definite evidence of intracranial metastatic disease. 3. Mild chronic microvascular ischemic changes and mild parenchymal volume loss of the brain. Electronically Signed   By: LKristine GarbeM.D.   On: 12/25/2016 00:37   Ct Abdomen Pelvis W Contrast  Result Date:  12/21/2016 CLINICAL DATA:  History of ruptured appendicitis, post percutaneous periappendiceal drainage catheter placement on 12/04/2016 and left trans gluteal approach percutaneous drainage catheter placement on 12/14/2016. EXAM: CT ABDOMEN AND PELVIS WITH CONTRAST TECHNIQUE: Multidetector CT imaging of the abdomen and pelvis was performed using the standard protocol following bolus administration of intravenous contrast. CONTRAST:  100 ISOVUE-300 IOPAMIDOL (ISOVUE-300) INJECTION 61% COMPARISON:  CT-guided periappendiceal abscess drainage catheter placement- 12/04/2016; left fibula approach percutaneous drainage catheter placement - 12/14/2016; CT abdomen pelvis -12/13/2016; 12/03/2016 ; chest CT- 11/14/2016 FINDINGS: Lower chest: Limited visualization of the lower thorax demonstrates chronic atelectasis / collapse of the right lower lobe. Interval increase in size of small left-sided effusion with complete atelectasis of the imaged portion of the left lower lobe. Interval increase in small right-sided pleural effusion. Cardiomegaly. Coronary artery calcifications. No pericardial effusion. Hepatobiliary: Mild nodularity hepatic contour. No discrete hepatic lesions. Normal appearance of the gallbladder given degree distention. No radiopaque gallstones. No intra extrahepatic bili duct dilatation. No ascites. Pancreas: Normal appearance of the pancreas Spleen: Normal appearance of the spleen. Note is made of a small splenule. Adrenals/Urinary Tract: There is symmetric enhancement and excretion of the bilateral kidneys. The left kidney appears mildly hypertrophied in comparison to the right. No definite renal stones this postcontrast examination. No discrete renal lesions. There is a minimal amount of likely age and body habitus related perinephric stranding. No urinary obstruction. Normal appearance  the bilateral adrenal glands. Normal appearance of the urinary bladder given underdistention with a focal catheter.  Stomach/Bowel: Partial retraction of Terry appendiceal abscess drainage catheter with radiopaque site marker now projecting external to the right abdominal musculature (image 67, series 2). The dominant component of the pigtail does remain within a residual 3.8 x 1.9 cm periappendiceal fluid collection (coronal image 54, series 5). Interval retraction of left trans gluteal approach percutaneous drainage catheter with radiopaque site marker now within in the central aspect of the left pelvic sidewall musculature (image 75, series 2). The tip of the pigtail drain does remain appropriately positioned within the post a vesicular space. There has been complete resolution of previously noted pelvic collection. A small amount of fluid is seen within the pelvic cul-de-sac without peripheral wall enhancement. Large colonic stool burden without evidence of enteric obstruction. The colon is again noted to be interposed about the liver edge normal appearance of the terminal ileum. No pneumoperitoneum, pneumatosis or portal venous gas. No new definable/ drainable fluid collections within the abdomen or pelvis. Vascular/Lymphatic: Large amount of irregular mixed calcified and noncalcified atherosclerotic plaque throughout the abdominal aorta. Suspected severe (at least 75%) luminal narrowing involving the right common iliac artery (axial image 58, series 2, coronal image 77, series 5), incompletely evaluated on this non CTA examination. The major branch vessels of the abdominal aorta appear patent on this non CTA examination. Scattered retroperitoneal, pelvic and inguinal lymph nodes are numerous though individually not enlarged by size criteria in presumed reactive due to patient body habitus. Reproductive: Normal appearance of the pelvic organs. No discrete adnexal lesion. Other: Large amount of body wall edema. Musculoskeletal: No acute or aggressive osseous abnormalities. Moderate DDD of L3-L4 with disc space height loss,  endplate irregularity and sclerosis. IMPRESSION: 1. Slight retraction of peri-appendiceal drainage catheter however tip remains within residual approximately 3.8 cm residual air and fluid collection. 2. Interval resolution of pelvic fluid collection following left-sided trans gluteal percutaneous drainage catheter placement. Note, the left-sided percutaneous drainage catheter has also been slightly retracted though tip remains well positioned within the midline of the lower pelvis. 3. No new definable/drainable abdominal or pelvic fluid collections. 4. Large colonic stool burden without evidence of enteric obstruction. 5. Similar appearance of known right lower lobe mass as previously characterized on dedicated chest CT performed 11/14/2016. 6. Increased bilateral pleural effusions with worsening left basilar atelectasis, though note, underlying infection is not excluded. 7. Cardiomegaly with increased size of bilateral pleural effusions and worsening diffuse body wall edema, constellation of findings worrisome for CHF. 8. Nodularity hepatic contour suggestive of early cirrhotic change. Correlation LFTs is recommended. 9. Aortic Atherosclerosis (ICD10-170.0) Suspected hemodynamically significant stenosis involving the right common iliac artery, incompletely evaluated on this non CTA examination. Correlation for right lower extremity PAD symptoms is recommended. Electronically Signed   By: Sandi Mariscal M.D.   On: 12/21/2016 12:44   Ir Sinus/fist Tube Chk-non Gi  Result Date: 12/21/2016 INDICATION: 71 year old female with a history of ruptured appendicitis and percutaneous drainage. CT drain placed 12/04/2016 EXAM: FLUOROSCOPIC DRAIN INJECTION MEDICATIONS: The patient is currently admitted to the hospital and receiving intravenous antibiotics. The antibiotics were administered within an appropriate time frame prior to the initiation of the procedure. ANESTHESIA/SEDATION: None COMPLICATIONS: None PROCEDURE: Informed  written consent was obtained from the patient after a thorough discussion of the procedural risks, benefits and alternatives. All questions were addressed. Sterile technique was utilized including caps, mask, sterile gowns, sterile gloves, sterile drape, hand hygiene and  skin antiseptic. A timeout was performed prior to the initiation of the procedure. Patient positioned supine position on the fluoroscopy table. Scout images of the right lower abdomen performed. Contrast was injected through the indwelling drain with images stored. Drain was then flushed and attached to gravity drainage. Patient tolerated procedure well and remained hemodynamically stable throughout. No complications were encountered and no significant blood loss. FINDINGS: Initial image demonstrates drain within the right lower quadrant. Injection of the drain demonstrates immediate filling of the tubular structure which is connected to the cecum. Small amount of contrast decompressed through the skin site along the tract of the drain. No abscess cavity identified. IMPRESSION: Status post drain injection of right lower quadrant in the region of prior ruptured appendicitis, confirming fistulous connection of the drainage catheter with the appendix. It appears as though the ruptured appendix has closed around the drain, with the current drain acting as a cecostomy tube. These results were called by telephone at the time of interpretation on 12/21/2016 at 2:34 pm to Dr. Earnstine Regal , who verbally acknowledged these results. Signed, Dulcy Fanny. Earleen Newport, DO [facility] Radiology Electronically Signed   By: Corrie Mckusick D.O.   On: 12/21/2016 14:35   Ir US Guide Bx Asp/drain  Result Date: 12/21/2016 INDICATION: 71 year old female with axillary lymphadenopathy. EXAM: IR ULTRASOUND GUIDANCE MEDICATIONS: None. ANESTHESIA/SEDATION: None FLUOROSCOPY TIME:  None COMPLICATIONS: None PROCEDURE: Informed  written consent was obtained from the patient after a thorough discussion of the procedural risks, benefits and alternatives. All questions were addressed. Maximal Sterile Barrier Technique was utilized including caps, mask, sterile gowns, sterile gloves, sterile drape, hand hygiene and skin antiseptic. A timeout was performed prior to the initiation of the procedure. Patient positioned supine position on the fluoroscopic table. Ultrasound images of the right axillary region were performed with images stored and sent to PACs. The patient was then prepped and draped in the usual sterile fashion. The skin and subcutaneous tissues were generously infiltrated 1% lidocaine for local anesthesia. Multiple core biopsy were achieved using 18 gauge device. Images were stored. Tissue samples placed in the saline. Patient tolerated the procedure well and remained hemodynamically stable throughout. No complications were encountered and no significant blood loss. IMPRESSION: Status post ultrasound-guided biopsy of right axillary node. Tissue specimen sent to pathology for complete histopathologic analysis. Signed, Dulcy Fanny. Earleen Newport, DO [facility] Radiology Electronically Signed   By: Corrie Mckusick D.O.   On: 12/21/2016 15:14   Dg Chest Port 1 View  Result Date: 12/31/2016 CLINICAL DATA:  CHF. EXAM: PORTABLE CHEST 1 VIEW COMPARISON:  CT 12/30/2016.  Chest x-ray 12/30/2016. FINDINGS: Cardiomegaly with pulmonary vascular prominence and bilateral interstitial prominence. Interim partial clearing of bibasilar atelectasis. Bilateral pleural effusions again noted. No pneumothorax. IMPRESSION: 1. Cardiomegaly with pulmonary vascular prominence and bilateral interstitial prominence. Findings consistent with mild CHF. Bilateral pleural effusions again noted. Similar findings noted on prior exams. 2. Interim partial clearing of bibasilar atelectasis. Electronically Signed   By: Marcello Moores   Register   On: 12/31/2016 06:55   Dg Chest Portable 1 View  Result Date: 12/30/2016 CLINICAL DATA:  Shortness of breath since this morning.  Ex-smoker. EXAM: PORTABLE CHEST 1 VIEW COMPARISON:  12/19/2016. FINDINGS: Stable enlarged cardiac silhouette and prominent pulmonary vasculature and interstitial markings. Small to moderate-sized right pleural effusion with mild right basilar atelectasis with little change. Aortic arch calcifications. Unremarkable bones. IMPRESSION: Stable cardiomegaly and changes of congestive heart failure. Aortic atherosclerosis. Electronically  Signed   By: Claudie Revering M.D.   On: 12/30/2016 12:13   Dg Chest Port 1 View  Result Date: 12/19/2016 CLINICAL DATA:  Respiratory distress EXAM: PORTABLE CHEST 1 VIEW COMPARISON:  December 18, 2016 FINDINGS: Central catheter tip is in the superior vena cava. No pneumothorax. There is airspace consolidation in each lower lobe with small pleural effusions bilaterally. There is cardiomegaly with mild pulmonary venous hypertension. No adenopathy. There is atherosclerotic calcification in the aorta. No bone lesions. IMPRESSION: Central catheter tip in superior vena cava. No pneumothorax. Findings indicative of a degree of congestive heart failure. Superimposed bibasilar pneumonia cannot be excluded radiographically. Pneumonia and alveolar edema may present concurrently. There is aortic atherosclerosis. Electronically Signed   By: Lowella Grip III M.D.   On: 12/19/2016 08:56   Dg Chest Port 1 View  Result Date: 12/18/2016 CLINICAL DATA:  Low oxygen saturation. EXAM: PORTABLE CHEST 1 VIEW COMPARISON:  12/17/2016.  CT 12/08/2016. FINDINGS: Right PICC line noted with tip projected over the right atrium. Cardiomegaly with pulmonary vascular prominence, bilateral interstitial prominence, bilateral pleural effusions. Findings consistent CHF. Interim slight progression from prior exam. Right infrahilar mass again noted. IMPRESSION: 1. Right PICC line  in stable position. 2. Congestive heart failure bilateral pulmonary interstitial edema bilateral pleural effusions. Interstitial edema is progressed from prior exam. 3. Right infrahilar mass again noted. Electronically Signed   By: Marcello Moores  Register   On: 12/18/2016 14:00    Labs:  CBC:  Recent Labs  12/21/16 0400 12/22/16 0500 12/24/16 0354 12/30/16 1209  WBC 12.1* 11.2* 7.5 10.5  HGB 9.7* 9.9* 9.9* 9.7*  HCT 30.1* 31.2* 29.9* 30.2*  PLT 207 197 203 257    COAGS:  Recent Labs  12/04/16 1005  INR 1.15    BMP:  Recent Labs  12/26/16 0445 12/30/16 1209 12/31/16 0103 01/01/17 0548  NA 133* 138 138 137  K 4.0 4.6 4.6 4.5  CL 86* 95* 92* 92*  CO2 40* 37* 36* 37*  GLUCOSE 82 96 136* 73  BUN 31* 26* 31* 31*  CALCIUM 8.5* 8.5* 8.8* 8.6*  CREATININE 0.89 0.76 1.00 0.90  GFRNONAA >60 >60 56* >60  GFRAA >60 >60 >60 >60    LIVER FUNCTION TESTS:  Recent Labs  12/19/16 1006 12/22/16 0500 12/30/16 1209 12/31/16 0103  BILITOT 0.5 0.6 0.6 0.7  AST 13* 24 14* 18  ALT 11* '23 14 15  '$ ALKPHOS 63 59 55 52  PROT 5.1* 5.5* 5.9* 6.1*  ALBUMIN 2.1* 2.4* 3.1* 3.0*    TUMOR MARKERS: No results for input(s): AFPTM, CEA, CA199, CHROMGRNA in the last 8760 hours.  Assessment and Plan:  Alisha Fisher is a 71 y.o. female with past medical history significant for COPD and hypertension who underwent percutaneous drainage catheter placement on 3/6S 2018 for ruptured appendicitis.  Patient has not been maintaining diligent records regarding the drainage catheter output, however reports there has been little to no output for the past several days. The patient is not currently flushing the percutaneous drainage catheter.  Patient complains of her catheter-related pain but otherwise without complaint. No fever or chills.  Review of noncontrast pelvic CT performed earlier today demonstrates malpositioning of the percutaneous drainage catheter with end located within the subcutaneous  cutaneous tissues external to the peritoneal cavity.   Drainage catheter injection was attempted however was unable to be performed secondary to fracture of the external portion of the drain.  Given lack of output from the percutaneous drainage catheter, as resolved  well resolution of her appendiceal abscess on noncontrast CT, the decision was made to remove the malfunctioning and malposition drain which was done at the patient's bedside without incident. A dressing was placed.  The patient was encouraged to keep all subsequent follow-up appointment with Dr. Hassell Done.   She is encouraged to call the interventional radiology drain clinic with any future questions or concerns however has not been given a follow-up appointment at this time.  A copy of this report was sent to the requesting provider on this date.  Electronically Signed: Sandi Mariscal 01/17/2017, 1:43 PM   I spent a total of 15 Minutes in face to face in clinical consultation, greater than 50% of which was counseling/coordinating care for percutaneous drainage catheter evaluation and management

## 2017-01-21 ENCOUNTER — Other Ambulatory Visit: Payer: Medicare Other

## 2017-01-21 ENCOUNTER — Ambulatory Visit: Payer: Medicare Other

## 2017-01-21 ENCOUNTER — Ambulatory Visit: Payer: Medicare Other | Admitting: Hematology & Oncology

## 2017-01-24 ENCOUNTER — Ambulatory Visit (HOSPITAL_BASED_OUTPATIENT_CLINIC_OR_DEPARTMENT_OTHER): Payer: Medicare Other

## 2017-01-24 ENCOUNTER — Other Ambulatory Visit (HOSPITAL_BASED_OUTPATIENT_CLINIC_OR_DEPARTMENT_OTHER): Payer: Medicare Other

## 2017-01-24 ENCOUNTER — Ambulatory Visit (HOSPITAL_BASED_OUTPATIENT_CLINIC_OR_DEPARTMENT_OTHER): Payer: Medicare Other | Admitting: Hematology & Oncology

## 2017-01-24 ENCOUNTER — Ambulatory Visit: Payer: Medicare Other

## 2017-01-24 VITALS — BP 175/70 | HR 76 | Temp 99.0°F | Resp 20 | Wt 230.4 lb

## 2017-01-24 DIAGNOSIS — C342 Malignant neoplasm of middle lobe, bronchus or lung: Secondary | ICD-10-CM | POA: Diagnosis present

## 2017-01-24 DIAGNOSIS — J449 Chronic obstructive pulmonary disease, unspecified: Secondary | ICD-10-CM

## 2017-01-24 LAB — CBC WITH DIFFERENTIAL (CANCER CENTER ONLY)
BASO#: 0 10*3/uL (ref 0.0–0.2)
BASO%: 0.4 % (ref 0.0–2.0)
EOS%: 1.9 % (ref 0.0–7.0)
Eosinophils Absolute: 0.2 10*3/uL (ref 0.0–0.5)
HEMATOCRIT: 32.6 % — AB (ref 34.8–46.6)
HGB: 10.5 g/dL — ABNORMAL LOW (ref 11.6–15.9)
LYMPH#: 0.7 10*3/uL — AB (ref 0.9–3.3)
LYMPH%: 6.4 % — ABNORMAL LOW (ref 14.0–48.0)
MCH: 31 pg (ref 26.0–34.0)
MCHC: 32.2 g/dL (ref 32.0–36.0)
MCV: 96 fL (ref 81–101)
MONO#: 0.5 10*3/uL (ref 0.1–0.9)
MONO%: 4.5 % (ref 0.0–13.0)
NEUT#: 9.3 10*3/uL — ABNORMAL HIGH (ref 1.5–6.5)
NEUT%: 86.8 % — AB (ref 39.6–80.0)
Platelets: 408 10*3/uL — ABNORMAL HIGH (ref 145–400)
RBC: 3.39 10*6/uL — ABNORMAL LOW (ref 3.70–5.32)
RDW: 15.1 % (ref 11.1–15.7)
WBC: 10.7 10*3/uL — ABNORMAL HIGH (ref 3.9–10.0)

## 2017-01-24 LAB — COMPREHENSIVE METABOLIC PANEL
ALBUMIN: 3.4 g/dL — AB (ref 3.5–5.0)
ALK PHOS: 90 U/L (ref 40–150)
ALT: 10 U/L (ref 0–55)
ANION GAP: 10 meq/L (ref 3–11)
AST: 12 U/L (ref 5–34)
BUN: 16.1 mg/dL (ref 7.0–26.0)
CALCIUM: 9.7 mg/dL (ref 8.4–10.4)
CO2: 30 mEq/L — ABNORMAL HIGH (ref 22–29)
Chloride: 97 mEq/L — ABNORMAL LOW (ref 98–109)
Creatinine: 1 mg/dL (ref 0.6–1.1)
EGFR: 55 mL/min/{1.73_m2} — AB (ref >90)
Glucose: 113 mg/dl (ref 70–140)
POTASSIUM: 4.7 meq/L (ref 3.5–5.1)
Sodium: 137 mEq/L (ref 136–145)
Total Bilirubin: 0.39 mg/dL (ref 0.20–1.20)
Total Protein: 7.1 g/dL (ref 6.4–8.3)

## 2017-01-24 LAB — LACTATE DEHYDROGENASE: LDH: 193 U/L (ref 125–245)

## 2017-01-24 NOTE — Progress Notes (Signed)
Hematology and Oncology Follow Up Visit  Alisha Fisher [id number] 10-12-1945 71 y.o. 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 She is a very nice 71 year old white female. I saw her in the hospital. This was back in late March. She actually was admitted after having a ruptured appendix. She has severe underlying COPD. She has a past history of tobacco use.  She ultimately was found to have a pulmonary mass. She had a CT scan done. This is done on March 10. This showed bilateral axillary adenopathy. She had right paratracheal adenopathy. She has subcarinal lymph node.  There was occlusion of the right lower and right middle lobe bronchus. She has some atelectasis.  She ultimately underwent  a bronchoscopy. Biopsies were taken. The biopsies showed squamous cell carcinoma.  Of note, she did have a biopsy of a enlarged axillary lymph node. This did not show any obvious malignancy.  She was seen by radiation oncology. She was given radiation therapy. I think she had 10 treatments of radiation.  She has she looks quite good. She comes in a wheelchair. She sees rebreathing a little bit better. Her she's had a good appetite. She's had no nausea or vomiting. She's had no change in bowel or bladder habits. She's had no diarrhea.  She has had some leg swelling but she feels this might be a little bit better.  She comes in with her sister. Her sister is pretty pleased with how well she is doing.  Overall, I would say that her performance status is ECOG 1.    Medications:  Current Outpatient Prescriptions:  .  acetaminophen (TYLENOL) 325 MG tablet, Take 2 tablets (650 mg total) by mouth every 6 (six) hours as needed for mild pain (breakthrough pain)., Disp: , Rfl:  .  albuterol (PROVENTIL HFA;VENTOLIN HFA)  108 (90 Base) MCG/ACT inhaler, Inhale 2 puffs into the lungs every 6 (six) hours as needed for wheezing or shortness of breath., Disp: , Rfl:  .  arformoterol (BROVANA) 15 MCG/2ML NEBU, Take 2 mLs (15 mcg total) by nebulization every 12 (twelve) hours., Disp: 120 mL, Rfl:  .  budesonide (PULMICORT) 0.5 MG/2ML nebulizer solution, Take 2 mLs (0.5 mg total) by nebulization every 12 (twelve) hours., Disp: , Rfl: 12 .  Cholecalciferol (VITAMIN D PO), Take 5,000 Units by mouth daily. , Disp: , Rfl:  .  escitalopram (LEXAPRO) 10 MG tablet, Take 10 mg by mouth daily., Disp: , Rfl:  .  famotidine (PEPCID) 40 MG tablet, Take 1 tablet (40 mg total) by mouth 2 (two) times daily., Disp: , Rfl:  .  furosemide (LASIX) 40 MG tablet, Take 1 tablet (40 mg total) by mouth 2 (two) times daily. To be adjusted at SNF based on daily weights and renal function, Disp: 30 tablet, Rfl: 0 .  gabapentin (NEURONTIN) 100 MG capsule, Take 1 capsule (100 mg total) by mouth 3 (three) times daily., Disp: , Rfl:  .  hydrOXYzine (ATARAX/VISTARIL) 25 MG tablet, Take 25 mg by mouth 3 (three) times daily as needed for anxiety., Disp: , Rfl:  .  loratadine (CLARITIN) 10 MG tablet, Take 1 tablet (10 mg total) by mouth daily., Disp: 30 tablet, Rfl: 11 .  losartan (COZAAR) 100 MG tablet, Take 1 tablet (100 mg total) by mouth daily., Disp: , Rfl:  .  metoprolol 75 MG TABS, Take 150 mg by mouth 2 (two) times daily., Disp: , Rfl:  .  nicotine (NICODERM CQ - DOSED IN MG/24 HOURS) 21 mg/24hr patch, Place 21 mg onto the skin daily., Disp: , Rfl:  .  ondansetron (ZOFRAN-ODT) 4 MG disintegrating tablet, Take 1 tablet (4 mg total) by mouth every 6 (six) hours as needed for nausea., Disp: 20 tablet, Rfl: 0 .  polyethylene glycol (MIRALAX / GLYCOLAX) packet, Take 17 g by mouth daily. Hold for diarrhea, Disp: 14 each, Rfl: 0 .  potassium chloride SA (K-DUR,KLOR-CON) 20 MEQ tablet, Take 1 tablet (20 mEq total) by mouth daily., Disp: , Rfl:   Allergies:    Allergies  Allergen Reactions  . Iohexol Rash    Pt has had two previous ct scans with a reaction: hives.  . Ciprofloxacin Rash    Rash possibly caused by Cipro?  . Flagyl [Metronidazole] Rash    Rash possibly caused by Flagyl?  Marland Kitchen Zosyn [Piperacillin Sod-Tazobactam So] Itching and Rash    Has patient had a PCN reaction causing immediate rash, facial/tongue/throat swelling, SOB or lightheadedness with hypotension: No Has patient had a PCN reaction causing severe rash involving mucus membranes or skin necrosis: No Has patient had a PCN reaction that required hospitalization: No Has patient had a PCN reaction occurring within the last 10 years: Yes If all of the above answers are "NO", then may proceed with Cephalosporin use.     Past Medical History, Surgical history, Social history, and Family History were reviewed and updated.  Review of Systems: As above  Physical Exam:  weight is 230 lb 6.4 oz (104.5 kg). Her oral temperature is 99 F (37.2 C). Her blood pressure is 175/70 (abnormal) and her pulse is 76. Her respiration is 20 and oxygen saturation is 92%.   Wt Readings from Last 3 Encounters:  01/24/17 230 lb 6.4 oz (104.5 kg)  01/01/17 240 lb 4.8 oz (109 kg)  12/26/16 243 lb 9.7 oz (110.5 kg)     Well-developed and well-nourished white female. Head and neck exam shows no ocular or oral lesions. There are no palpable cervical or supraclavicular lymph nodes. Lungs show decent breath sounds on the right side. They sound decreased on the left side. Cardiac exam regular rate and rhythm. She has been occasional extra beat. Axillary exam shows no obvious axillary adenopathy bilaterally. Abdomen is soft. Bowel sounds are present. She has no guarding or rebound tenderness. She has no palpable fluid wave. There is no palpable liver or spleen tip. Extremities shows no clubbing or cyanosis. She does have some trace edema in her lower legs. Neurological is an shows no focal neurological  deficits. Skin exam shows no rashes, ecchymoses or petechia.  Lab Results  Component Value Date   WBC 10.7 (H) 01/24/2017   HGB 10.5 (L) 01/24/2017   HCT 32.6 (L) 01/24/2017   MCV 96 01/24/2017   PLT 408 (H) 01/24/2017     Chemistry      Component Value Date/Time   NA 137 01/01/2017 0548   K 4.5 01/01/2017 0548   CL 92 (L) 01/01/2017 0548   CO2 37 (H) 01/01/2017 0548   BUN 31 (H) 01/01/2017 0548   CREATININE 0.90 01/01/2017 0548      Component Value Date/Time   CALCIUM 8.6 (L) 01/01/2017 0548   ALKPHOS 52 12/31/2016 0103   AST 18 12/31/2016 0103   ALT 15 12/31/2016 0103   BILITOT 0.7 12/31/2016 0103  Impression and Plan: Alisha Fisher is a 71 year old white female. She has severe underlying COPD. Her FEV1 is 1.14.  I think we really need to see how well she has no radiation. I am somewhat surprised that the lymph node biopsy was negative for any malignancy. There is still that concerned that she does have metastatic disease.  I think a PET scan would be helpful. Depending on the PET scan, we will see if another biopsy is warranted.  I called pathology today. I asked them to run a PD-L1 study on 1 of her biopsies. Hopefully, this will help [place] out.  She still has a somewhat marginal performance status. I'm not sure how aggressive we can really get with systemic chemotherapy. Hopefully move my ability to consider immunotherapy.  It was nice to see her out of the hospital. I know she had a really hard time in the hospital.  Will get the PET scan next week. I will see her back the week afterwards.  I spent about 40 minutes with she and her sister.   Volanda Napoleon, MD 4/26/201812:44 PM

## 2017-01-25 LAB — PREALBUMIN: PREALBUMIN: 20 mg/dL (ref 10–36)

## 2017-01-28 ENCOUNTER — Encounter: Payer: Self-pay | Admitting: Radiation Oncology

## 2017-01-28 NOTE — Progress Notes (Signed)
  Radiation Oncology         [phone]) [phone] ________________________________  Name: Alisha Fisher MRN: [id number]  Date: 01/28/2017  DOB: October 22, 1945  End of Treatment Note  Diagnosis:  Malignant neoplasm of middle lobe of right lung    Indication for treatment:  Curative    Radiation treatment dates: 12/21/16-01/03/17  Site/dose:  Right lung/ 30 Gy in 10 fractions  Beams/energy:  3D/ 10X  Narrative: The patient tolerated radiation treatment relatively well. During treatment, the patient complained of increased skin irritation including dryness and itching to the treatment area. She reported improvement of her shortness of breath.  Plan: The patient has completed radiation treatment. The patient will return to radiation oncology clinic for routine followup in one month. I advised them to call or return sooner if they have any questions or concerns related to their recovery or treatment.  ------------------------------------------------  Jodelle Gross, MD, PhD  This document serves as a record of services personally performed by Kyung Rudd, MD. It was created on his behalf by Bethann Humble, a trained medical scribe. The creation of this record is based on the scribe's personal observations and the provider's statements to them. This document has been checked and approved by the attending provider.

## 2017-01-29 ENCOUNTER — Encounter (HOSPITAL_COMMUNITY): Payer: Medicare Other

## 2017-02-05 ENCOUNTER — Encounter (HOSPITAL_COMMUNITY)
Admission: RE | Admit: 2017-02-05 | Discharge: 2017-02-05 | Disposition: A | Discharge status: Home / Self Care | Payer: Medicare Other | Source: Ambulatory Visit | Attending: Hematology & Oncology | Admitting: Hematology & Oncology

## 2017-02-05 DIAGNOSIS — I2510 Atherosclerotic heart disease of native coronary artery without angina pectoris: Secondary | ICD-10-CM | POA: Insufficient documentation

## 2017-02-05 DIAGNOSIS — C342 Malignant neoplasm of middle lobe, bronchus or lung: Secondary | ICD-10-CM | POA: Diagnosis present

## 2017-02-05 DIAGNOSIS — J439 Emphysema, unspecified: Secondary | ICD-10-CM | POA: Insufficient documentation

## 2017-02-05 DIAGNOSIS — I700 Atherosclerosis of aorta: Secondary | ICD-10-CM | POA: Diagnosis not present

## 2017-02-05 DIAGNOSIS — J90 Pleural effusion, not elsewhere classified: Secondary | ICD-10-CM | POA: Insufficient documentation

## 2017-02-05 DIAGNOSIS — R911 Solitary pulmonary nodule: Secondary | ICD-10-CM | POA: Insufficient documentation

## 2017-02-05 LAB — GLUCOSE, CAPILLARY: GLUCOSE-CAPILLARY: 78 mg/dL (ref 65–99)

## 2017-02-05 MED ORDER — FLUDEOXYGLUCOSE F - 18 (FDG) INJECTION: 11.4000 | Freq: Once | INTRAVENOUS | Status: DC | PRN

## 2017-02-06 ENCOUNTER — Other Ambulatory Visit: Payer: Self-pay | Admitting: *Deleted

## 2017-02-06 DIAGNOSIS — C3491 Malignant neoplasm of unspecified part of right bronchus or lung: Secondary | ICD-10-CM

## 2017-02-07 ENCOUNTER — Encounter: Payer: Self-pay | Admitting: Radiation Oncology

## 2017-02-07 ENCOUNTER — Other Ambulatory Visit (HOSPITAL_BASED_OUTPATIENT_CLINIC_OR_DEPARTMENT_OTHER): Payer: Medicare Other

## 2017-02-07 ENCOUNTER — Ambulatory Visit (HOSPITAL_BASED_OUTPATIENT_CLINIC_OR_DEPARTMENT_OTHER): Payer: Medicare Other | Admitting: Hematology & Oncology

## 2017-02-07 VITALS — BP 158/65 | HR 84 | Temp 98.4°F | Resp 18 | Wt 223.1 lb

## 2017-02-07 DIAGNOSIS — C342 Malignant neoplasm of middle lobe, bronchus or lung: Secondary | ICD-10-CM

## 2017-02-07 DIAGNOSIS — J449 Chronic obstructive pulmonary disease, unspecified: Secondary | ICD-10-CM

## 2017-02-07 DIAGNOSIS — C3491 Malignant neoplasm of unspecified part of right bronchus or lung: Secondary | ICD-10-CM

## 2017-02-07 LAB — CBC WITH DIFFERENTIAL (CANCER CENTER ONLY)
BASO#: 0 10*3/uL (ref 0.0–0.2)
BASO%: 0.4 % (ref 0.0–2.0)
EOS%: 2.9 % (ref 0.0–7.0)
Eosinophils Absolute: 0.3 10*3/uL (ref 0.0–0.5)
HEMATOCRIT: 33.9 % — AB (ref 34.8–46.6)
HGB: 11.4 g/dL — ABNORMAL LOW (ref 11.6–15.9)
LYMPH#: 0.9 10*3/uL (ref 0.9–3.3)
LYMPH%: 9.1 % — ABNORMAL LOW (ref 14.0–48.0)
MCH: 31.9 pg (ref 26.0–34.0)
MCHC: 33.6 g/dL (ref 32.0–36.0)
MCV: 95 fL (ref 81–101)
MONO#: 0.6 10*3/uL (ref 0.1–0.9)
MONO%: 5.8 % (ref 0.0–13.0)
NEUT#: 8.2 10*3/uL — ABNORMAL HIGH (ref 1.5–6.5)
NEUT%: 81.8 % — ABNORMAL HIGH (ref 39.6–80.0)
Platelets: 292 10*3/uL (ref 145–400)
RBC: 3.57 10*6/uL — ABNORMAL LOW (ref 3.70–5.32)
RDW: 15 % (ref 11.1–15.7)
WBC: 10 10*3/uL (ref 3.9–10.0)

## 2017-02-07 LAB — COMPREHENSIVE METABOLIC PANEL (CC13)
ALT: 9 IU/L (ref 0–32)
AST: 13 IU/L (ref 0–40)
Albumin, Serum: 3.7 g/dL (ref 3.6–4.8)
Albumin/Globulin Ratio: 1.2 (ref 1.2–2.2)
Alkaline Phosphatase, S: 82 IU/L (ref 39–117)
BUN/Creatinine Ratio: 15 (ref 12–28)
BUN: 16 mg/dL (ref 8–27)
Bilirubin Total: 0.3 mg/dL (ref 0.0–1.2)
CALCIUM: 9.8 mg/dL (ref 8.7–10.3)
CREATININE: 1.05 mg/dL — AB (ref 0.57–1.00)
Carbon Dioxide, Total: 27 mmol/L (ref 18–29)
Chloride, Ser: 98 mmol/L (ref 96–106)
GFR calc Af Amer: 63 mL/min/{1.73_m2} (ref >59)
GFR calc non Af Amer: 54 mL/min/{1.73_m2} — ABNORMAL LOW (ref >59)
GLOBULIN, TOTAL: 3.2 g/dL (ref 1.5–4.5)
GLUCOSE: 88 mg/dL (ref 65–99)
Potassium, Ser: 4.4 mmol/L (ref 3.5–5.2)
Sodium: 133 mmol/L — ABNORMAL LOW (ref 134–144)
Total Protein: 6.9 g/dL (ref 6.0–8.5)

## 2017-02-07 NOTE — Progress Notes (Signed)
Hematology and Oncology Follow Up Visit  Alisha Fisher [id number] 12-20-1945 71 y.o. 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 She keeps looking better and better. I'm just very impressed with how well she looks.  She we did go ahead and get a PET scan on her. This was done on May 8. This shows the primary nodule in the left upper lobe measuring 1.9 x 1.3 cm. He had an SUV of 12.3. There is now only faint activity in the right infrahilar and subcarinal lymph nodes. The SUV is 4.6. This is where she had radiation. There is no disease noted elsewhere.  I am very impressed with how well radiation work. As such, I am just wondering if she should not be a candidate for further radiation therapy.  I thought that maybe SRS to the primary site in the left upper lobe might be worthwhile. I wonder if she can get additional radiation to the mediastinal lymph nodes.  I would hate to have to give her systemic chemotherapy for disease that really appears be locally advanced.  She is eating well. She's having no problems with nausea or vomiting. She's had no change in bowel or bladder habits.  She is breathing better. She is not using any oxygen.  She is using diuretics to help with edema in her legs.   Overall, I would say that her performance status is ECOG 1.    Medications:  Current Outpatient Prescriptions:  .  acetaminophen (TYLENOL) 325 MG tablet, Take 2 tablets (650 mg total) by mouth every 6 (six) hours as needed for mild pain (breakthrough pain)., Disp: , Rfl:  .  albuterol (PROVENTIL HFA;VENTOLIN HFA) 108 (90 Base) MCG/ACT inhaler, Inhale 2 puffs into the lungs every 6 (six) hours as needed for wheezing or shortness of breath., Disp: , Rfl:  .  Cholecalciferol (VITAMIN D PO), Take 5,000 Units by mouth  daily. , Disp: , Rfl:  .  escitalopram (LEXAPRO) 10 MG tablet, Take 10 mg by mouth daily., Disp: , Rfl:  .  furosemide (LASIX) 40 MG tablet, Take 1 tablet (40 mg total) by mouth 2 (two) times daily. To be adjusted at SNF based on daily weights and renal function, Disp: 30 tablet, Rfl: 0 .  gabapentin (NEURONTIN) 100 MG capsule, Take 1 capsule (100 mg total) by mouth 3 (three) times daily., Disp: , Rfl:  .  hydrOXYzine (ATARAX/VISTARIL) 25 MG tablet, Take 25 mg by mouth 3 (three) times daily as needed for anxiety., Disp: , Rfl:  .  loratadine (CLARITIN) 10 MG tablet, Take 1 tablet (10 mg total) by mouth daily., Disp: 30 tablet, Rfl: 11 .  losartan (COZAAR) 100 MG tablet, Take 1 tablet (100 mg total) by mouth daily., Disp: , Rfl:  .  metoprolol 75 MG TABS, Take 150 mg by mouth 2 (two) times daily., Disp: , Rfl:  .  ondansetron (ZOFRAN-ODT) 4 MG disintegrating tablet, Take 1 tablet (4 mg total) by mouth every 6 (six) hours as needed for nausea., Disp: 20 tablet, Rfl: 0 .  potassium chloride SA (K-DUR,KLOR-CON) 20 MEQ tablet, Take 1 tablet (20 mEq total) by mouth daily., Disp: , Rfl:  No current facility-administered medications for this visit.   Facility-Administered Medications Ordered in Other Visits:  .  fludeoxyglucose F - 18 (FDG) injection 41.3 millicurie,  38.1 millicurie, Intravenous, Once PRN, Genia Del, MD  Allergies:  Allergies  Allergen Reactions  . Iohexol Rash    Pt has had two previous ct scans with a reaction: hives.  . Ciprofloxacin Rash    Rash possibly caused by Cipro?  . Flagyl [Metronidazole] Rash    Rash possibly caused by Flagyl?  Marland Kitchen Zosyn [Piperacillin Sod-Tazobactam So] Itching and Rash    Has patient had a PCN reaction causing immediate rash, facial/tongue/throat swelling, SOB or lightheadedness with hypotension: No Has patient had a PCN reaction causing severe rash involving mucus membranes or skin necrosis: No Has patient had a PCN reaction that required  hospitalization: No Has patient had a PCN reaction occurring within the last 10 years: Yes If all of the above answers are "NO", then may proceed with Cephalosporin use.     Past Medical History, Surgical history, Social history, and Family History were reviewed and updated.  Review of Systems: As above  Physical Exam:  weight is 223 lb 1.9 oz (101.2 kg). Her oral temperature is 98.4 F (36.9 C). Her blood pressure is 158/65 (abnormal) and her pulse is 84. Her respiration is 18 and oxygen saturation is 92%.   Wt Readings from Last 3 Encounters:  02/07/17 223 lb 1.9 oz (101.2 kg)  01/24/17 230 lb 6.4 oz (104.5 kg)  01/01/17 240 lb 4.8 oz (109 kg)     Well-developed and well-nourished white female. Head and neck exam shows no ocular or oral lesions. There are no palpable cervical or supraclavicular lymph nodes. Lungs show decent breath sounds on the right side. They sound decreased on the left side. Cardiac exam regular rate and rhythm. She has been occasional extra beat. Axillary exam shows no obvious axillary adenopathy bilaterally. Abdomen is soft. Bowel sounds are present. She has no guarding or rebound tenderness. She has no palpable fluid wave. There is no palpable liver or spleen tip. Extremities shows no clubbing or cyanosis. She does have some trace edema in her lower legs. Neurological is an shows no focal neurological deficits. Skin exam shows no rashes, ecchymoses or petechia.  Lab Results  Component Value Date   WBC 10.0 02/07/2017   HGB 11.4 (L) 02/07/2017   HCT 33.9 (L) 02/07/2017   MCV 95 02/07/2017   PLT 292 02/07/2017     Chemistry      Component Value Date/Time   NA 133 (L) 02/07/2017 1313   NA 137 01/24/2017 1143   K 4.4 02/07/2017 1313   K 4.7 01/24/2017 1143   CL 98 02/07/2017 1313   CO2 27 02/07/2017 1313   CO2 30 (H) 01/24/2017 1143   BUN 16 02/07/2017 1313   BUN 16.1 01/24/2017 1143   CREATININE 1.05 (H) 02/07/2017 1313   CREATININE 1.0 01/24/2017  1143      Component Value Date/Time   CALCIUM 9.8 02/07/2017 1313   CALCIUM 9.7 01/24/2017 1143   ALKPHOS 82 02/07/2017 1313   ALKPHOS 90 01/24/2017 1143   AST 13 02/07/2017 1313   AST 12 01/24/2017 1143   ALT 9 02/07/2017 1313   ALT 10 01/24/2017 1143   BILITOT 0.3 02/07/2017 1313   BILITOT 0.39 01/24/2017 1143         Impression and Plan: Alisha Fisher is a 71 year old white female. She has severe underlying COPD. Her FEV1 is 1.14.  Again, I'm very impressed with how well she has done with radiation. I really want to try to see if additional radiation might be able to  be given.  I also called pathology. I'm having the cytology that was recovered and sent off for PD-L1 evaluation. Hopefully they got enough material.  I spoke to she and her sister for about a half hour. I am again very impressed with how well she has recovered and how well she looks.  I will plan to see her back in about 4 weeks or so. Again we will see if radiation oncology might consider her for SRS to the left upper lobe lesion and maybe further radiation to the right mediastinal adenopathy.   Volanda Napoleon, MD 5/10/20185:40 PM

## 2017-02-13 ENCOUNTER — Ambulatory Visit
Admission: RE | Admit: 2017-02-13 | Discharge: 2017-02-13 | Disposition: A | Discharge status: Home / Self Care | Payer: Medicare Other | Source: Ambulatory Visit | Attending: Radiation Oncology | Admitting: Radiation Oncology

## 2017-02-14 ENCOUNTER — Ambulatory Visit: Payer: Medicare Other | Admitting: Pulmonary Disease

## 2017-02-16 NOTE — Progress Notes (Signed)
Cardiology Office Note    Date:  02/18/2017   ID:  Alisha Fisher, DOB 25-Mar-1946, MRN [id number]  PCP:  [name], MD  Cardiologist: Sinclair Grooms, MD   Chief Complaint  Patient presents with  . Coronary Artery Disease    History of Present Illness:  Alisha Fisher is a 71 y.o. female with known history of COPD, hypertension, chronic diastolic heart failure, and obesity. 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  Overall, Alisha Fisher has significant health issues. She currently is confused about her medical regimen. She is here for blood pressure management. We can I get a consistent history from her concerning her current regimen. It appears that she has alternating Hyzaar and furosemide. She also is unaware if she is on metoprolol succinate or tartrate. She has no cardiopulmonary complaints. She has not gained significant fluid weight. During the hospital stay for  Past Medical History:  Diagnosis Date  . COPD, severe ([place])   . HTN (hypertension)   . Pneumonia   . Pulmonary emphysema ([place])   . SOB (shortness of breath)     Past Surgical History:  Procedure Laterality Date  . CATARACT EXTRACTION    . ENDOBRONCHIAL ULTRASOUND Bilateral 12/17/2016   Procedure: ENDOBRONCHIAL ULTRASOUND;  Surgeon: Javier Glazier, MD;  Location: [facility];  Service: Cardiopulmonary;  Laterality: Bilateral;  . IR GENERIC HISTORICAL  12/21/2016   IR SINUS/FIST TUBE CHK-NON GI 12/21/2016 [facility]  . IR GENERIC HISTORICAL  12/21/2016   IR US GUIDE BX ASP/DRAIN 12/21/2016 Corrie Mckusick, DO [facility]    Current Medications: Outpatient Medications Prior to Visit  Medication Sig Dispense Refill  . acetaminophen (TYLENOL) 325 MG tablet Take 2 tablets (650 mg total) by mouth every 6 (six) hours as needed for mild pain  (breakthrough pain).    Marland Kitchen albuterol (PROVENTIL HFA;VENTOLIN HFA) 108 (90 Base) MCG/ACT inhaler Inhale 2 puffs into the lungs every 6 (six) hours as needed for wheezing or shortness of breath.    . Cholecalciferol (VITAMIN D PO) Take 5,000 Units by mouth daily.     Marland Kitchen escitalopram (LEXAPRO) 10 MG tablet Take 10 mg by mouth daily.    . furosemide (LASIX) 40 MG tablet Take 1 tablet (40 mg total) by mouth 2 (two) times daily. To be adjusted at SNF based on daily weights and renal function 30 tablet 0  . hydrOXYzine (ATARAX/VISTARIL) 25 MG tablet Take 25 mg by mouth 3 (three) times daily as needed for anxiety.    Marland Kitchen loratadine (CLARITIN) 10 MG tablet Take 1 tablet (10 mg total) by mouth daily. 30 tablet 11  . ondansetron (ZOFRAN-ODT) 4 MG disintegrating tablet Take 1 tablet (4 mg total) by mouth every 6 (six) hours as needed for nausea. 20 tablet 0  . potassium chloride SA (K-DUR,KLOR-CON) 20 MEQ tablet Take 1 tablet (20 mEq total) by mouth daily.    Marland Kitchen gabapentin (NEURONTIN) 100 MG capsule Take 1 capsule (100 mg total) by mouth 3 (three) times daily. (Patient not taking: Reported on 02/18/2017)    . losartan (COZAAR) 100 MG tablet Take 1 tablet (100 mg total) by mouth daily. (Patient not taking: Reported on 02/18/2017)    . metoprolol 75 MG TABS Take 150 mg by mouth 2 (two) times daily. (Patient not taking: Reported on 02/18/2017)     No facility-administered medications prior  to visit.      Allergies:   Iohexol; Ciprofloxacin; Flagyl [metronidazole]; and Zosyn [piperacillin sod-tazobactam so]   Social History   Social History  . Marital status: Widowed    Spouse name: N/A  . Number of children: N/A  . Years of education: N/A   Occupational History  . retired    Social History Main Topics  . Smoking status: Former Smoker    Packs/day: 0.50    Years: 50.00    Types: Cigarettes    Quit date: 11/01/2016  . Smokeless tobacco: Never Used  . Alcohol use No  . Drug use: No  . Sexual activity: Not  Asked   Other Topics Concern  . None   Social History Narrative   [facility] (12/10/16):   Patient's widower son and grandchildren moved in with her. Her husband passed years ago. Previously worked in [employer].     Family History:  The patient's family history includes Breast cancer in her mother; COPD in her cousin; Diabetes in her father and son.   ROS:   Please see the history of present illness.    Denies rapid weight gain. Confusion concerning the current medical regimen. Has difficulty with wheezing, anxiety, rash, back discomfort, leg swelling, depression, leg pain, and fatigue.  All other systems reviewed and are negative.   PHYSICAL EXAM:   VS:  BP (!) 150/68 (BP Location: Left Arm)   Pulse 81   Ht '5\' 7"'$  (1.702 m)   Wt 236 lb (107 kg)   BMI 36.96 kg/m    GEN: Well nourished, well developed, in no acute distress  HEENT: normal  Neck: no JVD, carotid bruits, or masses Cardiac: RRR; no murmurs, rubs, or gallops,no edema  Respiratory:  clear to auscultation bilaterally, normal work of breathing GI: soft, nontender, nondistended, + BS MS: no deformity or atrophy  Skin: warm and dry, no rash Neuro:  Alert and Oriented x 3, Strength and sensation are intact Psych: euthymic mood, full affect  Wt Readings from Last 3 Encounters:  02/18/17 236 lb (107 kg)  02/07/17 223 lb 1.9 oz (101.2 kg)  01/24/17 230 lb 6.4 oz (104.5 kg)      Studies/Labs Reviewed:   EKG:  EKG  Not repeated. Last tracing performed on 12/31/16 was normal.  Recent Labs: 12/30/2016: Magnesium 1.8; TSH 2.356 12/31/2016: B Natriuretic Peptide 1,330.1 02/07/2017: ALT 9; BUN 16; Creatinine, Ser 1.05; HGB 11.4; Platelets 292; Potassium, Ser 4.4; Sodium 133   Lipid Panel    Component Value Date/Time   TRIG 81 12/15/2016 0452    Additional studies/ records that were reviewed today include:  Echocardiogram 12/22/2016: Study Conclusions  - Left ventricle: The cavity size was normal. Systolic function  was   vigorous. The estimated ejection fraction was in the range of 65%   to 70%. Wall motion was normal; there were no regional wall   motion abnormalities. Features are consistent with a pseudonormal   left ventricular filling pattern, with concomitant abnormal   relaxation and increased filling pressure (grade 2 diastolic   dysfunction). Doppler parameters are consistent with elevated   ventricular end-diastolic filling pressure. - Aortic valve: Trileaflet; mildly thickened, moderately calcified   leaflets. Transvalvular velocity was minimally increased. There   was mild stenosis. There was mild regurgitation. Mean gradient   (S): 8 mm Hg. Peak gradient (S): 17 mm Hg. Valve area (VTI): 1.86   cm^2. Valve area (Vmax): 1.97 cm^2. Valve area (Vmean): 1.95   cm^2. - Aortic root:  The aortic root was normal in size. - Left atrium: The atrium was mildly dilated. - Right ventricle: The cavity size was normal. Wall thickness was   normal. Systolic function was normal. - Right atrium: The atrium was normal in size. - Tricuspid valve: There was mild regurgitation. - Pulmonary arteries: Systolic pressure was within the normal   range. - Inferior vena cava: The vessel was normal in size. - Pericardium, extracardiac: A trivial pericardial effusion was   identified. Features were not consistent with tamponade   physiology. There was a large left pleural effusion.    ASSESSMENT:    1. Chronic diastolic heart failure ([place])   2. Respiratory insufficiency   3. Essential hypertension   4. Smoker   5. Class 2 obesity due to excess calories in adult, unspecified BMI, unspecified whether serious comorbidity present   6. Primary cancer of right middle lobe of lung ([place])      PLAN:  In order of problems listed above:  1. Chronic diastolic heart failure. We will discontinue furosemide. Resume Hyzaar 100/25 mg per day. Continue metoprolol but call us to specifically identify whether tartrate or  succinate preparation is what she has. She prefers once a day therapy so we will switch to succinate at the appropriate dose, probably 100-150 milligrams per day. 2. Respiratory status seems stable at the current time. 3. BP is elevated but should get better on losartan, amlodipine, HCTZ, and moderate dose beta blocker therapy. 4. She is no longer smoking.  Blood pressure clinic in 3-4 weeks with basic metabolic panel.  Medication Adjustments/Labs and Tests Ordered: Current medicines are reviewed at length with the patient today.  Concerns regarding medicines are outlined above.  Medication changes, Labs and Tests ordered today are listed in the Patient Instructions below. Patient Instructions  Medication Instructions:  1) DISCONTINUE Furosemide 2) Make sure you are taking Losartan HCTZ once daily and Amlodipine once daily 3) Please call me and let me know whether your Metoprolol is Tartrate or Succinate and whether you are taking it once or twice daily.  Labwork: Your physician recommends that you return for lab work at time of your Hypertension clinic appointment.   Testing/Procedures: None  Follow-Up: Your physician recommends that you schedule a follow-up appointment in: 1 month with our Hypertension Clinic.  Your physician wants you to follow-up in: 6 months or as needed with Dr. Tamala Julian.  You will receive a reminder letter in the mail two months in advance. If you don't receive a letter, please call our office to schedule the follow-up appointment.    Any Other Special Instructions Will Be Listed Below (If Applicable).     If you need a refill on your cardiac medications before your next appointment, please call your pharmacy.      Signed, Sinclair Grooms, MD  02/18/2017 2:27 PM    [facility] [place], [place], [place]  [address] Phone: [phone]; Fax: [phone]

## 2017-02-18 ENCOUNTER — Encounter (INDEPENDENT_AMBULATORY_CARE_PROVIDER_SITE_OTHER): Payer: Self-pay

## 2017-02-18 ENCOUNTER — Encounter: Payer: Self-pay | Admitting: Interventional Cardiology

## 2017-02-18 ENCOUNTER — Encounter: Payer: Self-pay | Admitting: Radiation Oncology

## 2017-02-18 ENCOUNTER — Ambulatory Visit
Admission: RE | Admit: 2017-02-18 | Discharge: 2017-02-18 | Disposition: A | Discharge status: Home / Self Care | Payer: Medicare Other | Source: Ambulatory Visit | Attending: Radiation Oncology | Admitting: Radiation Oncology

## 2017-02-18 ENCOUNTER — Ambulatory Visit (INDEPENDENT_AMBULATORY_CARE_PROVIDER_SITE_OTHER): Payer: Medicare Other | Admitting: Interventional Cardiology

## 2017-02-18 VITALS — BP 170/73 | HR 87 | Temp 97.8°F | Resp 18 | Ht 67.0 in | Wt 219.6 lb

## 2017-02-18 VITALS — BP 150/68 | HR 81 | Ht 67.0 in | Wt 236.0 lb

## 2017-02-18 DIAGNOSIS — R0689 Other abnormalities of breathing: Secondary | ICD-10-CM

## 2017-02-18 DIAGNOSIS — E6609 Other obesity due to excess calories: Secondary | ICD-10-CM

## 2017-02-18 DIAGNOSIS — C3412 Malignant neoplasm of upper lobe, left bronchus or lung: Secondary | ICD-10-CM | POA: Insufficient documentation

## 2017-02-18 DIAGNOSIS — F17200 Nicotine dependence, unspecified, uncomplicated: Secondary | ICD-10-CM

## 2017-02-18 DIAGNOSIS — Z881 Allergy status to other antibiotic agents status: Secondary | ICD-10-CM | POA: Diagnosis not present

## 2017-02-18 DIAGNOSIS — C342 Malignant neoplasm of middle lobe, bronchus or lung: Secondary | ICD-10-CM | POA: Diagnosis not present

## 2017-02-18 DIAGNOSIS — Z888 Allergy status to other drugs, medicaments and biological substances status: Secondary | ICD-10-CM | POA: Diagnosis not present

## 2017-02-18 DIAGNOSIS — Z91041 Radiographic dye allergy status: Secondary | ICD-10-CM | POA: Insufficient documentation

## 2017-02-18 DIAGNOSIS — I5032 Chronic diastolic (congestive) heart failure: Secondary | ICD-10-CM | POA: Diagnosis not present

## 2017-02-18 DIAGNOSIS — C3401 Malignant neoplasm of right main bronchus: Secondary | ICD-10-CM | POA: Diagnosis not present

## 2017-02-18 DIAGNOSIS — Z79899 Other long term (current) drug therapy: Secondary | ICD-10-CM | POA: Insufficient documentation

## 2017-02-18 DIAGNOSIS — Z510 Encounter for antineoplastic radiation therapy: Secondary | ICD-10-CM | POA: Insufficient documentation

## 2017-02-18 DIAGNOSIS — Z7951 Long term (current) use of inhaled steroids: Secondary | ICD-10-CM | POA: Insufficient documentation

## 2017-02-18 DIAGNOSIS — I10 Essential (primary) hypertension: Secondary | ICD-10-CM | POA: Diagnosis not present

## 2017-02-18 NOTE — Progress Notes (Signed)
Radiation Oncology         [phone] ________________________________  Name: Alisha Fisher MRN: [id number]  Date: 02/18/2017  DOB: 08/23/1946  Post Treatment Note  CC: [name], MD  Volanda Napoleon, MD  Diagnosis:  At least stage III NSCLC, squamous cell carcinoma of the right lung.   Interval Since Last Radiation:  7 weeks   12/21/16-01/03/17: Right lung/ 30 Gy in 10 fractions  Narrative:  The patient returns today for routine follow-up. The patient tolerated radiotherapy well. She did not have any post-treatment concerns, and is awaiting her plans for systemic therapy with Dr. Marin Olp. She returns not only for her one month follow up, but also to discuss possible options of continued radiotherapy.                      On review of systems, the patient states she  ALLERGIES:  is allergic to iohexol; ciprofloxacin; flagyl [metronidazole]; and zosyn [piperacillin sod-tazobactam so].  Meds: Current Outpatient Prescriptions  Medication Sig Dispense Refill  . acetaminophen (TYLENOL) 325 MG tablet Take 2 tablets (650 mg total) by mouth every 6 (six) hours as needed for mild pain (breakthrough pain).    . ADVAIR DISKUS 250-50 MCG/DOSE AEPB Inhale 1 puff into the lungs daily.    Marland Kitchen albuterol (PROVENTIL HFA;VENTOLIN HFA) 108 (90 Base) MCG/ACT inhaler Inhale 2 puffs into the lungs every 6 (six) hours as needed for wheezing or shortness of breath.    Marland Kitchen amLODipine (NORVASC) 10 MG tablet Take 10 mg by mouth daily.    . Cholecalciferol (VITAMIN D PO) Take 5,000 Units by mouth daily.     . diphenhydrAMINE (BENADRYL) 25 mg capsule Take 25 mg by mouth every 4 (four) hours as needed for itching or allergies.    Marland Kitchen escitalopram (LEXAPRO) 10 MG tablet Take 10 mg by mouth daily.    . furosemide (LASIX) 40 MG tablet Take 1 tablet (40 mg total) by mouth 2 (two) times daily. To be adjusted at SNF based on daily weights and renal function 30 tablet 0  . gabapentin (NEURONTIN) 400 MG capsule Take  400 mg by mouth at bedtime.    . hydrOXYzine (ATARAX/VISTARIL) 25 MG tablet Take 25 mg by mouth 3 (three) times daily as needed for anxiety.    Marland Kitchen loratadine (CLARITIN) 10 MG tablet Take 1 tablet (10 mg total) by mouth daily. 30 tablet 11  . losartan-hydrochlorothiazide (HYZAAR) 100-25 MG tablet Take 1 tablet by mouth daily.    . metoprolol succinate (TOPROL-XL) 100 MG 24 hr tablet Take 100 mg by mouth daily. Take with or immediately following a meal.    . ondansetron (ZOFRAN-ODT) 4 MG disintegrating tablet Take 1 tablet (4 mg total) by mouth every 6 (six) hours as needed for nausea. 20 tablet 0  . potassium chloride SA (K-DUR,KLOR-CON) 20 MEQ tablet Take 1 tablet (20 mEq total) by mouth daily.     No current facility-administered medications for this encounter.     Physical Findings:  height is '5\' 7"'$  (1.702 m) and weight is 219 lb 9.6 oz (99.6 kg). Her oral temperature is 97.8 F (36.6 C). Her blood pressure is 170/73 (abnormal) and her pulse is 87. Her respiration is 18 and oxygen saturation is 98%.  Pain Assessment Pain Score: 0-No pain/10 In general this is a well appearing caucasian female in no acute distress. She's alert and oriented x4 and appropriate throughout the examination. Cardiopulmonary assessment is negative for acute distress and  she exhibits normal effort.   Lab Findings: Lab Results  Component Value Date   WBC 10.0 02/07/2017   HGB 11.4 (L) 02/07/2017   HCT 33.9 (L) 02/07/2017   MCV 95 02/07/2017   PLT 292 02/07/2017     Radiographic Findings: Nm Pet Image Initial (pi) Skull Base To Thigh  Result Date: 02/05/2017 CLINICAL DATA:  Initial treatment strategy for left upper lobe nodule with occluded bronchus intermedius. EXAM: NUCLEAR MEDICINE PET SKULL BASE TO THIGH TECHNIQUE: 11.4 mCi F-18 FDG was injected intravenously. Full-ring PET imaging was performed from the skull base to thigh after the radiotracer. CT data was obtained and used for attenuation correction and  anatomic localization. FASTING BLOOD GLUCOSE:  Value: 78 mg/dl COMPARISON:  Multiple exams, including chest CT dated 12/30/2016 FINDINGS: NECK No hypermetabolic lymph nodes in the neck. Carotid atherosclerotic calcifications. CHEST 1.9 by 1.3 cm left upper lobe pulmonary nodule on image 27/8 has a maximum standard uptake value of 12.3. Faintly accentuated metabolic activity and right infrahilar and subcarinal lymph nodes, for example right infrahilar nodes have a maximum standard uptake value of 4.6, background mediastinal blood pool activity 3.2. Trace bilateral pleural effusions. No obvious hypermetabolic pleural mass. Faintly accentuated activity in the distal esophagus is likely physiologic. Paraseptal emphysema. Mild to moderate cardiomegaly. Volume loss and air bronchograms in the right lower lobe along the right hemidiaphragm. Severe narrowing of the right lower lobe broncho I along the bronchus intermedius potentially with some plugging or other filling defect in the bronchus in this vicinity, corresponding to some of the low-grade accentuated metabolic activity. These scattered small upper mediastinal lymph nodes are not overtly hypermetabolic although are notable in number. ABDOMEN/PELVIS No abnormal hypermetabolic activity within the liver, pancreas, adrenal glands, or spleen. No hypermetabolic lymph nodes in the abdomen or pelvis. Hepatic morphology raise the possibility of early cirrhosis. Aortoiliac atherosclerotic vascular disease. No adrenal mass. Mild right renal atrophy. SKELETON No focal hypermetabolic activity to suggest skeletal metastasis. IMPRESSION: 1. The 1.9 cm left upper lobe pulmonary nodule is hypermetabolic with a maximum SUV of 12.3, favoring lung cancer. 2. Low-grade activity along the right bronchus intermedius and infrahilar region where there is suspected to be some adenopathy. Difficult to exclude a low-grade tumor in this vicinity such as bronchial carcinoid tumor or similar,  although the maximum SUV is only 4.6. 3. Aortic Atherosclerosis (ICD10-I70.0) and Emphysema (ICD10-J43.9). 4. Mild to moderate cardiomegaly with coronary atherosclerosis. 5. Trace bilateral pleural effusions. 6. Hepatic morphology raises the possibility of early cirrhosis. Electronically Signed   By: Van Clines M.D.   On: 02/05/2017 15:38    Impression/Plan: 1. At least Stage III, NSCLC, squamous cell carcinoma of the right lung. The patient has recovered well since radiotherapy. She is anticipating systemic therapy in the near future, and is awaiting her PDL-1 testing. I will discuss her case with Dr. Lisbeth Renshaw and let the patient know about proceeding with further radiotherapy. She is in agreement with this plan.      Carola Rhine, PAC

## 2017-02-18 NOTE — Progress Notes (Addendum)
Kareem Aul 71 y.o. woman with Malignant neoplasm of middle lobe of right lung radiation completed 01-03-17, one month FU.    Weight changes, if any: Wt Readings from Last 3 Encounters:  02/18/17 219 lb 9.6 oz (99.6 kg)  02/18/17 236 lb (107 kg)  02/07/17 223 lb 1.9 oz (101.2 kg)   Respiratory complaints, if any: SOB with exertion,dry coughing, occasional wheezing Hemoptysis, if any: None Swallowing Problems/Pain/Difficulty swallowing:Denies difficulty swallowing while eating or drinking. Smoking Tobacco/Marijuana/Snuff/ETOH use:Former smoker x 50 years 1/2 P/D quit 11-01-16  No drug or alcohol usage Appetite : Eating two meals per day and snacks. Pain:No When is next chemo scheduled?:No Imaging:02-05-17 PET  Lab work from of chart:01-24-17 Cmet Reports itching to back,chest and legs skin does not look dry scratching her back. 02-07-17 Saw Dr. Marin Olp  will plan to see her back in about 4 weeks or so. Again we will see if radiation oncology might consider her for SRS to the left upper lobe lesion and maybe further radiation to the right mediastinal adenopathy BP (!) 170/73   Pulse 87   Temp 97.8 F (36.6 C) (Oral)   Resp 18   Ht '5\' 7"'$  (1.702 m)   Wt 219 lb 9.6 oz (99.6 kg)   SpO2 98%   BMI 34.39 kg/m

## 2017-02-18 NOTE — Patient Instructions (Signed)
Medication Instructions:  1) DISCONTINUE Furosemide 2) Make sure you are taking Losartan HCTZ once daily and Amlodipine once daily 3) Please call me and let me know whether your Metoprolol is Tartrate or Succinate and whether you are taking it once or twice daily.  Labwork: Your physician recommends that you return for lab work at time of your Hypertension clinic appointment.   Testing/Procedures: None  Follow-Up: Your physician recommends that you schedule a follow-up appointment in: 1 month with our Hypertension Clinic.  Your physician wants you to follow-up in: 6 months or as needed with Dr. Tamala Julian.  You will receive a reminder letter in the mail two months in advance. If you don't receive a letter, please call our office to schedule the follow-up appointment.    Any Other Special Instructions Will Be Listed Below (If Applicable).     If you need a refill on your cardiac medications before your next appointment, please call your pharmacy.

## 2017-02-21 ENCOUNTER — Encounter: Payer: Self-pay | Admitting: Acute Care

## 2017-02-21 ENCOUNTER — Ambulatory Visit (INDEPENDENT_AMBULATORY_CARE_PROVIDER_SITE_OTHER): Payer: Medicare Other | Admitting: Acute Care

## 2017-02-21 VITALS — BP 154/70 | HR 94 | Ht 67.0 in | Wt 234.0 lb

## 2017-02-21 DIAGNOSIS — J449 Chronic obstructive pulmonary disease, unspecified: Secondary | ICD-10-CM | POA: Diagnosis not present

## 2017-02-21 DIAGNOSIS — I5032 Chronic diastolic (congestive) heart failure: Secondary | ICD-10-CM

## 2017-02-21 DIAGNOSIS — I10 Essential (primary) hypertension: Secondary | ICD-10-CM | POA: Diagnosis not present

## 2017-02-21 DIAGNOSIS — R0602 Shortness of breath: Secondary | ICD-10-CM

## 2017-02-21 DIAGNOSIS — C3491 Malignant neoplasm of unspecified part of right bronchus or lung: Secondary | ICD-10-CM | POA: Diagnosis not present

## 2017-02-21 MED ORDER — AEROCHAMBER MV MISC: 0 refills | Status: DC

## 2017-02-21 NOTE — Patient Instructions (Addendum)
It is good to see you again. We will [facility] an order to [facility] for an [employer] simply go portable oxygen concentrator. Saturations today were 95% on room air which is great. Saturation goal is 88-92% We will get you an aero chamber for use with your rescue inhaler. Continue using your Advair 2 puffs twice daily. Remember to rinse your mouth after use  Follow up at Blood pressure clinic in 1 month per Dr. Tamala Julian Follow up in 3 months with Dr. Ashok Cordia  Please contact office for sooner follow up if symptoms do not improve or worsen or seek emergency care  We will see about getting you a handicap placard We will schedule repeat PFT's before the follow up with Dr. Ashok Cordia.

## 2017-02-21 NOTE — Assessment & Plan Note (Addendum)
  Blood pressure 154/70 today in the office Continue hydrochlorothiazide, losartan, amlodipine,  and moderate dose beta blocker therapy. Follow-up  blood pressure clinic per Dr. Tamala Julian in 1 month with [facility] All further care per cardiology

## 2017-02-21 NOTE — Assessment & Plan Note (Signed)
Resolved flare  Plan We will place an order to [facility] for an [employer]ogen simply go portable oxygen concentrator. Saturations today were 95% on room air which is great. Saturation goal is 88-92% We will get you an aero chamber for use with your rescue inhaler. Continue using your Advair 2 puffs twice daily. Remember to rinse your mouth after use  Follow up in 3 months with Dr. Ashok Cordia  Please contact office for sooner follow up if symptoms do not improve or worsen or seek emergency care  We will see about getting you a handicap placard We will schedule repeat PFT's before the follow up with Dr. Ashok Cordia.

## 2017-02-21 NOTE — Assessment & Plan Note (Signed)
Stable at present Plan Continue daily weights and assessment for edema Follow-up with cardiology

## 2017-02-21 NOTE — Assessment & Plan Note (Signed)
Patient with excellent response to radiation therapy. Plan Oncology is currently evaluating her for immunotherapy versus SRS to the left upper lobe lesion, and possible further radiation to the right mediastinal adenopathy. Pathology have sent tissue for PDL 1, to evaluate for immunotherapy.  Plan per radiation oncology/oncology

## 2017-02-21 NOTE — Progress Notes (Signed)
History of Present Illness Alisha Fisher is a 71 y.o. female 71 y.o. female former smoker ( quit 11/01/2016)  with COPD on home oxygen,, Diastolic heart failure, and squamous cell lung cancer, stage 4,  treated with radiation treatments x 10.She was seen by Dr. Vaughan Browner as an inpatient .   02/21/2017 1 month Follow Up: Pt. Presents for 1 month follow up. She was initially seen for hospital follow-up on 01/07/2017 after hospitalization March 5 through 12/30/2016 for acute on chronic respiratory failure, secondary to decompensated acute on chronic diastolic heart failure and pulmonary edema.. She has had significant improvement since we last saw her. She is no longer using her nebs treatments, but has transitioned back to her maintenance Advair.. She is using her Advair in the morning and the evening.She is not having to use her rescue inhaler at all.She has stopped her lasix per Dr. Tamala Julian, but continues to take hydrochlorothiazide for blood pressure control. She is being seen  in one month by the blood pressure clinic through Dr. Thompson Caul office. She has had excellent response to radiation therapy. Oncology is currently evaluating her for immunotherapy versus SRS to the left upper lobe lesion, and possible further radiation to the right mediastinal adenopathy. Pathology have sent tissue for PDL 1, to evaluate for immunotherapy. She looks remarkably well. Lower extremity edema that was present last month has resolved. She is not wearing oxygen today and saturations are 96% on room air. She denies fever, chest pain, orthopnea, or hemoptysis.   Test Results:  4.9.2018>> CXR IMPRESSION:Enlargement of cardiac silhouette with aortic atherosclerosis.  Bibasilar pleural effusions with RIGHT basilar atelectasis and minimal LEFT mid lung subsegmental atelectasis.  Overall probably little interval change.  IMAGING/STUDIES: PFT 04/22/12:FVC 1.90 L (67%) FEV1 1.14 L (47%) FEV1/FVC 0.60 FEF 25-75 0.44 L (70%)  negative bronchodilator response TLC 4.09 L (75%) RV 100% ERV 30% DLCO uncorrected 63%  CT CHEST W/O 12/08/16: Endobronchial obstruction with cut off in the right lower lobe and some evidence of obstruction of the right middle lobe bronchus. Subsequent collapse versus consolidation of right lower lobe and associated pleural effusion. Small left pleural effusion as well. Left-sided nodule which is spiculated. Pathologically enlarged subcarinal as well as one precarinal lymph node. Apical predominant emphysematous changes noted.  3/19 bronchoscopy w/ endobronchial FNA of bronchus intermedius mass, brushing of RMSB &EBUS w/ FNA  12/22/2016: Echo Left ventricle: The cavity size was normal. Systolic function was vigorous. The estimated ejection fraction was in the range of 65% to 70%. Wall motion was normal; there were no regional wall motion abnormalities. Features are consistent with a pseudonormal left ventricular filling pattern, with concomitant abnormal relaxation and increased filling pressure (grade 2 diastolic dysfunction). Doppler parameters are consistent with elevated ventricular end-diastolic filling pressure. - Aortic valve: Trileaflet; mildly thickened, moderately calcified leaflets. Transvalvular velocity was minimally increased. There was mild stenosis. There was mild regurgitation. Mean gradient (S): 8 mm Hg. Peak gradient (S): 17 mm Hg. Valve area (VTI): 1.86 cm^2. Valve area (Vmax): 1.97 cm^2. Valve area (Vmean): 1.95 cm^2. - Left atrium: The atrium was mildly dilated. - Right ventricle: The cavity size was normal. Wall thickness was normal. Systolic function was normal. - Right atrium: The atrium was normal in size. - Tricuspid valve: There was mild regurgitation. - Pulmonary arteries: Systolic pressure was within the normal range. - Inferior vena cava: The vessel was normal in size. - Pericardium, extracardiac: A trivial pericardial  effusion was identified. Features were not consistent with tamponade physiology.  There was a large left pleural effusion.   MICROBIOLOGY: Abscess Culture 3/6: Enterococcus faecalis  ANTIBIOTICS: Zosyn 3/5 - 3/8 Cipro 3/8 (x1 dose) Flagyl 3/8 - 3/10 Vancomycin 3/9 >>> Invanz 3/10 >>>   CBC Latest Ref Rng & Units 02/07/2017 01/24/2017 12/30/2016  WBC 3.9 - 10.0 10e3/uL 10.0 10.7(H) 10.5  Hemoglobin 11.6 - 15.9 g/dL 11.4(L) 10.5(L) 9.7(L)  Hematocrit 34.8 - 46.6 % 33.9(L) 32.6(L) 30.2(L)  Platelets 145 - 400 10e3/uL 292 408(H) 257    BMP Latest Ref Rng & Units 02/07/2017 01/24/2017 01/01/2017  Glucose 65 - 99 mg/dL 88 113 73  BUN 8 - 27 mg/dL 16 16.1 31(H)  Creatinine 0.57 - 1.00 mg/dL 1.05(H) 1.0 0.90  BUN/Creat Ratio 12 - 28 15 - -  Sodium 134 - 144 mmol/L 133(L) 137 137  Potassium 3.5 - 5.2 mmol/L 4.4 4.7 4.5  Chloride 96 - 106 mmol/L 98 - 92(L)  CO2 18 - 29 mmol/L 27 30(H) 37(H)  Calcium 8.7 - 10.3 mg/dL 9.8 9.7 8.6(L)    BNP    Component Value Date/Time   BNP 1,330.1 (H) 12/31/2016 0103    Nm Pet Image Initial (pi) Skull Base To Thigh  Result Date: 02/05/2017 CLINICAL DATA:  Initial treatment strategy for left upper lobe nodule with occluded bronchus intermedius. EXAM: NUCLEAR MEDICINE PET SKULL BASE TO THIGH TECHNIQUE: 11.4 mCi F-18 FDG was injected intravenously. Full-ring PET imaging was performed from the skull base to thigh after the radiotracer. CT data was obtained and used for attenuation correction and anatomic localization. FASTING BLOOD GLUCOSE:  Value: 78 mg/dl COMPARISON:  Multiple exams, including chest CT dated 12/30/2016 FINDINGS: NECK No hypermetabolic lymph nodes in the neck. Carotid atherosclerotic calcifications. CHEST 1.9 by 1.3 cm left upper lobe pulmonary nodule on image 27/8 has a maximum standard uptake value of 12.3. Faintly accentuated metabolic activity and right infrahilar and subcarinal lymph nodes, for example right infrahilar nodes have a  maximum standard uptake value of 4.6, background mediastinal blood pool activity 3.2. Trace bilateral pleural effusions. No obvious hypermetabolic pleural mass. Faintly accentuated activity in the distal esophagus is likely physiologic. Paraseptal emphysema. Mild to moderate cardiomegaly. Volume loss and air bronchograms in the right lower lobe along the right hemidiaphragm. Severe narrowing of the right lower lobe broncho I along the bronchus intermedius potentially with some plugging or other filling defect in the bronchus in this vicinity, corresponding to some of the low-grade accentuated metabolic activity. These scattered small upper mediastinal lymph nodes are not overtly hypermetabolic although are notable in number. ABDOMEN/PELVIS No abnormal hypermetabolic activity within the liver, pancreas, adrenal glands, or spleen. No hypermetabolic lymph nodes in the abdomen or pelvis. Hepatic morphology raise the possibility of early cirrhosis. Aortoiliac atherosclerotic vascular disease. No adrenal mass. Mild right renal atrophy. SKELETON No focal hypermetabolic activity to suggest skeletal metastasis. IMPRESSION: 1. The 1.9 cm left upper lobe pulmonary nodule is hypermetabolic with a maximum SUV of 12.3, favoring lung cancer. 2. Low-grade activity along the right bronchus intermedius and infrahilar region where there is suspected to be some adenopathy. Difficult to exclude a low-grade tumor in this vicinity such as bronchial carcinoid tumor or similar, although the maximum SUV is only 4.6. 3. Aortic Atherosclerosis (ICD10-I70.0) and Emphysema (ICD10-J43.9). 4. Mild to moderate cardiomegaly with coronary atherosclerosis. 5. Trace bilateral pleural effusions. 6. Hepatic morphology raises the possibility of early cirrhosis. Electronically Signed   By: Van Clines M.D.   On: 02/05/2017 15:38     Past medical hx Past  Medical History:  Diagnosis Date  . COPD, severe ([place])   . HTN (hypertension)   .  Pneumonia   . Pulmonary emphysema ([place])   . SOB (shortness of breath)      Social History  Substance Use Topics  . Smoking status: Former Smoker    Packs/day: 0.50    Years: 50.00    Types: Cigarettes    Quit date: 11/01/2016  . Smokeless tobacco: Never Used  . Alcohol use No    Tobacco Cessation: Patient is a former smoker, quit smoking to 10/20/2016 with a 35 pack year smoking history  Past surgical hx, Family hx, Social hx all reviewed.  Current Outpatient Prescriptions on File Prior to Visit  Medication Sig  . acetaminophen (TYLENOL) 325 MG tablet Take 2 tablets (650 mg total) by mouth every 6 (six) hours as needed for mild pain (breakthrough pain).  . ADVAIR DISKUS 250-50 MCG/DOSE AEPB Inhale 1 puff into the lungs daily.  Marland Kitchen albuterol (PROVENTIL HFA;VENTOLIN HFA) 108 (90 Base) MCG/ACT inhaler Inhale 2 puffs into the lungs every 6 (six) hours as needed for wheezing or shortness of breath.  Marland Kitchen amLODipine (NORVASC) 10 MG tablet Take 10 mg by mouth daily.  . Cholecalciferol (VITAMIN D PO) Take 5,000 Units by mouth daily.   . diphenhydrAMINE (BENADRYL) 25 mg capsule Take 25 mg by mouth every 4 (four) hours as needed for itching or allergies.  Marland Kitchen escitalopram (LEXAPRO) 10 MG tablet Take 10 mg by mouth daily.  . furosemide (LASIX) 40 MG tablet Take 1 tablet (40 mg total) by mouth 2 (two) times daily. To be adjusted at SNF based on daily weights and renal function  . gabapentin (NEURONTIN) 400 MG capsule Take 400 mg by mouth at bedtime.  . hydrOXYzine (ATARAX/VISTARIL) 25 MG tablet Take 25 mg by mouth 3 (three) times daily as needed for anxiety.  Marland Kitchen loratadine (CLARITIN) 10 MG tablet Take 1 tablet (10 mg total) by mouth daily.  Marland Kitchen losartan-hydrochlorothiazide (HYZAAR) 100-25 MG tablet Take 1 tablet by mouth daily.  . metoprolol succinate (TOPROL-XL) 100 MG 24 hr tablet Take 100 mg by mouth daily. Take with or immediately following a meal.  . ondansetron (ZOFRAN-ODT) 4 MG disintegrating  tablet Take 1 tablet (4 mg total) by mouth every 6 (six) hours as needed for nausea.  . potassium chloride SA (K-DUR,KLOR-CON) 20 MEQ tablet Take 1 tablet (20 mEq total) by mouth daily.   No current facility-administered medications on file prior to visit.      Allergies  Allergen Reactions  . Iohexol Rash    Pt has had two previous ct scans with a reaction: hives.  . Ciprofloxacin Rash    Rash possibly caused by Cipro?  . Flagyl [Metronidazole] Rash    Rash possibly caused by Flagyl?  Marland Kitchen Zosyn [Piperacillin Sod-Tazobactam So] Itching and Rash    Has patient had a PCN reaction causing immediate rash, facial/tongue/throat swelling, SOB or lightheadedness with hypotension: No Has patient had a PCN reaction causing severe rash involving mucus membranes or skin necrosis: No Has patient had a PCN reaction that required hospitalization: No Has patient had a PCN reaction occurring within the last 10 years: Yes If all of the above answers are "NO", then may proceed with Cephalosporin use.     Review Of Systems:  Constitutional:   No  weight loss, night sweats,  Fevers, chills, fatigue, or  lassitude.  HEENT:   No headaches,  Difficulty swallowing,  Tooth/dental problems, or  Sore throat,  No sneezing, itching, ear ache, nasal congestion, post nasal drip,   CV:  No chest pain,  Orthopnea, PND, swelling in lower extremities, anasarca, dizziness, palpitations, syncope.   GI  No heartburn, indigestion, abdominal pain, nausea, vomiting, diarrhea, change in bowel habits, loss of appetite, bloody stools.   Resp: No shortness of breath with exertion or at rest.  No excess mucus, no productive cough,  No non-productive cough,  No coughing up of blood.  No change in color of mucus.  No wheezing.  No chest wall deformity  Skin: no rash or lesions.  GU: no dysuria, change in color of urine, no urgency or frequency.  No flank pain, no hematuria   MS:  No joint pain or swelling.  No  decreased range of motion.  No back pain.  Psych:  No change in mood or affect. No depression or anxiety.  No memory loss.   Vital Signs BP (!) 154/70 (BP Location: Left Arm, Cuff Size: Normal)   Pulse 94   Ht 5\' 7"  (1.702 m)   Wt 234 lb (106.1 kg)   SpO2 96%   BMI 36.65 kg/m    Physical Exam:  General- No distress,  A&Ox3, pleasant female  ENT: No sinus tenderness, TM clear, pale nasal mucosa, no oral exudate,no post nasal drip, no LAN, no JVD Cardiac: S1, S2, regular rate and rhythm, no murmur, no edema Chest: No wheeze/ rales/ dullness; no accessory muscle use, no nasal flaring, no sternal retractions, normal work of breathing Abd.: Soft Non-tender, bowel sounds positive,Nondistended , obese  Ext: No clubbing cyanosis, Neuro: Deconditioned at baseline, using walker , moving all extremities 4 , alert and oriented and appropriate  Skin: No rashes, warm and dry, some bruising noted , otherwise intact  Psych: normal mood and behavior   Assessment/Plan  COPD without exacerbation (HCC) Resolved flare  Plan [facility] [employer] simply go portable oxygen concentrator. Saturations today were 95% on room air which is great. Saturation goal is 88-92% We will get you an aero chamber for use with your rescue inhaler. Continue using your Advair 2 puffs twice daily. Remember to rinse your mouth after use  Follow up in 3 months with Dr. Ashok Cordia  Please contact office for sooner follow up if symptoms do not improve or worsen or seek emergency care  We will see about getting you a handicap placard We will schedule repeat PFT's before the follow up with Dr. Ashok Cordia.    Chronic diastolic heart failure (HCC) Stable at present Plan Continue daily weights and assessment for edema Follow-up with cardiology  Squamous cell lung cancer, right [facility]) Patient with excellent response to radiation therapy. Plan Oncology is currently evaluating her for  immunotherapy versus SRS to the left upper lobe lesion, and possible further radiation to the right mediastinal adenopathy. Pathology have sent tissue for PDL 1, to evaluate for immunotherapy.  Plan per radiation oncology/oncology   Essential hypertension  Blood pressure 154/70 today in the office Continue hydrochlorothiazide, losartan, amlodipine,  and moderate dose beta blocker therapy. Follow-up  blood pressure clinic per Dr. Tamala Julian in 1 month with [facility] All further care per cardiology    Magdalen Spatz, NP 02/21/2017  5:11 PM

## 2017-02-26 NOTE — Progress Notes (Signed)
Note reviewed.  Sonia Baller Ashok Cordia, M.D. [facility] Pulmonary & Critical Care Pager:  [phone] After 3pm or if no response, call [phone] 7:33 AM 02/26/17

## 2017-03-15 ENCOUNTER — Encounter: Payer: Self-pay | Admitting: Interventional Radiology

## 2017-03-20 ENCOUNTER — Ambulatory Visit (HOSPITAL_BASED_OUTPATIENT_CLINIC_OR_DEPARTMENT_OTHER): Payer: Medicare Other | Admitting: Hematology & Oncology

## 2017-03-20 ENCOUNTER — Other Ambulatory Visit (HOSPITAL_BASED_OUTPATIENT_CLINIC_OR_DEPARTMENT_OTHER): Payer: Medicare Other

## 2017-03-20 VITALS — BP 166/60 | HR 74 | Temp 98.2°F | Resp 18 | Wt 236.0 lb

## 2017-03-20 DIAGNOSIS — J449 Chronic obstructive pulmonary disease, unspecified: Secondary | ICD-10-CM | POA: Diagnosis not present

## 2017-03-20 DIAGNOSIS — C3491 Malignant neoplasm of unspecified part of right bronchus or lung: Secondary | ICD-10-CM | POA: Diagnosis present

## 2017-03-20 DIAGNOSIS — C342 Malignant neoplasm of middle lobe, bronchus or lung: Secondary | ICD-10-CM | POA: Diagnosis present

## 2017-03-20 LAB — CBC WITH DIFFERENTIAL (CANCER CENTER ONLY)
BASO#: 0.1 10*3/uL (ref 0.0–0.2)
BASO%: 0.4 % (ref 0.0–2.0)
EOS%: 2.4 % (ref 0.0–7.0)
Eosinophils Absolute: 0.3 10*3/uL (ref 0.0–0.5)
HCT: 35.1 % (ref 34.8–46.6)
HEMOGLOBIN: 11.9 g/dL (ref 11.6–15.9)
LYMPH#: 0.9 10*3/uL (ref 0.9–3.3)
LYMPH%: 7.4 % — ABNORMAL LOW (ref 14.0–48.0)
MCH: 31.6 pg (ref 26.0–34.0)
MCHC: 33.9 g/dL (ref 32.0–36.0)
MCV: 93 fL (ref 81–101)
MONO#: 0.6 10*3/uL (ref 0.1–0.9)
MONO%: 5 % (ref 0.0–13.0)
NEUT%: 84.8 % — ABNORMAL HIGH (ref 39.6–80.0)
NEUTROS ABS: 10.5 10*3/uL — AB (ref 1.5–6.5)
Platelets: 333 10*3/uL (ref 145–400)
RBC: 3.77 10*6/uL (ref 3.70–5.32)
RDW: 12.4 % (ref 11.1–15.7)
WBC: 12.3 10*3/uL — ABNORMAL HIGH (ref 3.9–10.0)

## 2017-03-20 LAB — CMP (CANCER CENTER ONLY)
ALBUMIN: 3.6 g/dL (ref 3.3–5.5)
ALT(SGPT): 13 U/L (ref 10–47)
AST: 13 U/L (ref 11–38)
Alkaline Phosphatase: 83 U/L (ref 26–84)
BILIRUBIN TOTAL: 0.7 mg/dL (ref 0.20–1.60)
BUN, Bld: 19 mg/dL (ref 7–22)
CO2: 31 meq/L (ref 18–33)
CREATININE: 0.9 mg/dL (ref 0.6–1.2)
Calcium: 9.4 mg/dL (ref 8.0–10.3)
Chloride: 97 mEq/L — ABNORMAL LOW (ref 98–108)
Glucose, Bld: 89 mg/dL (ref 73–118)
Potassium: 4.5 mEq/L (ref 3.3–4.7)
SODIUM: 132 meq/L (ref 128–145)
TOTAL PROTEIN: 7.4 g/dL (ref 6.4–8.1)

## 2017-03-20 NOTE — Progress Notes (Signed)
Hematology and Oncology Follow Up Visit  Alisha Fisher [id number] 03-05-1946 71 y.o. 03/20/2017   Principle Diagnosis:   Squamous cell carcinoma of the lung-locally advanced  Current Therapy:    Status post palliative radiation therapy-done inpatient     Interim History:  Alisha Fisher is in for her follow-up.she looks good. She feels okay. She is not wearing oxygen. I think she may wear oxygen at nighttime.  To no surprise, on no further test could be done on the specimen that was obtained when she was in the hospital. I try to get the PD-L1 done. Of course, there was not enough specimen. No other testing could be done.  I still feel that radiation therapy, because it worked so well on her initial tumor burden, could be done again. I think this would be a very reasonable way of trying to help her.  She saw radiation oncology a few weeks ago. They did not make a decision regarding doing any further radiation. I will have to talk with him again.   Otherwise, she seems to be doing okay. The hot, humid air does bother her lungs. She just stays inside most the time which I totally agree with.  She is using diuretics to help with edema in her legs.   Overall, I would say that her performance status is ECOG 1.    Medications:  Current Outpatient Prescriptions:  .  ADVAIR DISKUS 250-50 MCG/DOSE AEPB, Inhale 1 puff into the lungs daily., Disp: , Rfl:  .  albuterol (PROVENTIL HFA;VENTOLIN HFA) 108 (90 Base) MCG/ACT inhaler, Inhale 2 puffs into the lungs every 6 (six) hours as needed for wheezing or shortness of breath., Disp: , Rfl:  .  amLODipine (NORVASC) 10 MG tablet, Take 10 mg by mouth daily., Disp: , Rfl:  .  Cholecalciferol (VITAMIN D PO), Take 5,000 Units by mouth daily. , Disp: , Rfl:  .  diphenhydrAMINE (BENADRYL) 25 mg capsule, Take 25 mg by mouth every 4 (four) hours as needed for itching or allergies., Disp: , Rfl:  .  escitalopram (LEXAPRO) 10 MG tablet, Take 10 mg by mouth  daily., Disp: , Rfl:  .  hydrOXYzine (ATARAX/VISTARIL) 25 MG tablet, Take 25 mg by mouth 3 (three) times daily as needed for anxiety., Disp: , Rfl:  .  loratadine (CLARITIN) 10 MG tablet, Take 1 tablet (10 mg total) by mouth daily., Disp: 30 tablet, Rfl: 11 .  losartan-hydrochlorothiazide (HYZAAR) 100-25 MG tablet, Take 1 tablet by mouth daily., Disp: , Rfl:  .  metoprolol succinate (TOPROL-XL) 100 MG 24 hr tablet, Take 100 mg by mouth daily. Take with or immediately following a meal., Disp: , Rfl:  .  potassium chloride SA (K-DUR,KLOR-CON) 20 MEQ tablet, Take 1 tablet (20 mEq total) by mouth daily., Disp: , Rfl:  .  Spacer/Aero-Holding Chambers (AEROCHAMBER MV) inhaler, Use as instructed, Disp: 1 each, Rfl: 0  Allergies:  Allergies  Allergen Reactions  . Iohexol Rash    Pt has had two previous ct scans with a reaction: hives.  . Ciprofloxacin Rash    Rash possibly caused by Cipro?  . Flagyl [Metronidazole] Rash    Rash possibly caused by Flagyl?  Marland Kitchen Zosyn [Piperacillin Sod-Tazobactam So] Itching and Rash    Has patient had a PCN reaction causing immediate rash, facial/tongue/throat swelling, SOB or lightheadedness with hypotension: No Has patient had a PCN reaction causing severe rash involving mucus membranes or skin necrosis: No Has patient had a PCN reaction that required hospitalization: No  Has patient had a PCN reaction occurring within the last 10 years: Yes If all of the above answers are "NO", then may proceed with Cephalosporin use.     Past Medical History, Surgical history, Social history, and Family History were reviewed and updated.  Review of Systems: As above  Physical Exam:  weight is 236 lb (107 kg). Her oral temperature is 98.2 F (36.8 C). Her blood pressure is 166/60 (abnormal) and her pulse is 74. Her respiration is 18 and oxygen saturation is 88% (abnormal).   Wt Readings from Last 3 Encounters:  03/20/17 236 lb (107 kg)  02/21/17 234 lb (106.1 kg)    02/18/17 219 lb 9.6 oz (99.6 kg)     Well-developed and well-nourished white female. Head and neck exam shows no ocular or oral lesions. There are no palpable cervical or supraclavicular lymph nodes. Lungs show decent breath sounds on the right side. They sound decreased on the left side. Cardiac exam regular rate and rhythm. She has been occasional extra beat. Axillary exam shows no obvious axillary adenopathy bilaterally. Abdomen is soft. Bowel sounds are present. She has no guarding or rebound tenderness. She has no palpable fluid wave. There is no palpable liver or spleen tip. Extremities shows no clubbing or cyanosis. She does have some trace edema in her lower legs. Neurological is an shows no focal neurological deficits. Skin exam shows no rashes, ecchymoses or petechia.  Lab Results  Component Value Date   WBC 12.3 (H) 03/20/2017   HGB 11.9 03/20/2017   HCT 35.1 03/20/2017   MCV 93 03/20/2017   PLT 333 03/20/2017     Chemistry      Component Value Date/Time   NA 132 03/20/2017 1152   NA 137 01/24/2017 1143   K 4.5 03/20/2017 1152   K 4.7 01/24/2017 1143   CL 97 (L) 03/20/2017 1152   CO2 31 03/20/2017 1152   CO2 30 (H) 01/24/2017 1143   BUN 19 03/20/2017 1152   BUN 16.1 01/24/2017 1143   CREATININE 0.9 03/20/2017 1152   CREATININE 1.0 01/24/2017 1143      Component Value Date/Time   CALCIUM 9.4 03/20/2017 1152   CALCIUM 9.7 01/24/2017 1143   ALKPHOS 83 03/20/2017 1152   ALKPHOS 90 01/24/2017 1143   AST 13 03/20/2017 1152   AST 12 01/24/2017 1143   ALT 13 03/20/2017 1152   ALT 10 01/24/2017 1143   BILITOT 0.70 03/20/2017 1152   BILITOT 0.39 01/24/2017 1143         Impression and Plan: Alisha Fisher is a 71 year old white female. She has severe underlying COPD. Her FEV1 is 1.14.  I really have to believe that radiation therapy to the mediastinum and her primary lung lesion will be the best way to go for her. I will speak with Dr. Lisbeth Renshaw. This really needs to get  done from my point of view.  I think if we are going to do any systemic therapy, we really have to get another biopsy. Maybe, since she is in much better shape, more tissue can be obtained so that we can run the appropriate studies.   I spent about 30 minutes with she and her sister. I want to see her back in 3 weeks.   Volanda Napoleon, MD 6/20/201812:42 PM

## 2017-03-21 ENCOUNTER — Other Ambulatory Visit: Payer: Medicare Other

## 2017-03-21 ENCOUNTER — Ambulatory Visit: Payer: Medicare Other

## 2017-03-22 ENCOUNTER — Encounter: Payer: Self-pay | Admitting: Radiation Oncology

## 2017-03-25 NOTE — Progress Notes (Signed)
REConsult  NSCLC squamous cell carcinoma lung-locally advanced   Dr. Marin Olp note 03/20/17  To re=eval for further radiation  To the mediastinum and primary lung lesion , not enough specimen for PD-L      Radiation 12/21/16-01/03/17: Right Lung 30Gy/10 fractions  Had follow up on 02/18/17 with Shona Simpson, PA-C  Patient arrived via w/c, slight unsteady restless leg syndrome, left leg heavy, , vitals below no c/o pain, just c/o losing her hair, not sure from what, saw PA at her Primary MD office today, will have thyroid test,   BP (!) 179/74 Comment: right arm  Pulse 76   Temp 97.8 F (36.6 C) (Oral)   Resp 20   Ht 5\' 7"  (1.702 m)   Wt 236 lb (107 kg)   SpO2 91% Comment: room air  BMI 36.96 kg/m   Wt Readings from Last 3 Encounters:  03/26/17 236 lb (107 kg)  03/20/17 236 lb (107 kg)  02/21/17 234 lb (106.1 kg)     Bowels regular, bladder over active at times, shortnes breath with exertion wears oxygen as needed, and at night time 2:44 PM

## 2017-03-26 ENCOUNTER — Ambulatory Visit
Admission: RE | Admit: 2017-03-26 | Discharge: 2017-03-26 | Disposition: A | Discharge status: Home / Self Care | Payer: Medicare Other | Source: Ambulatory Visit | Attending: Radiation Oncology | Admitting: Radiation Oncology

## 2017-03-26 ENCOUNTER — Encounter: Payer: Self-pay | Admitting: Radiation Oncology

## 2017-03-26 VITALS — BP 179/74 | HR 76 | Temp 97.8°F | Resp 20 | Ht 67.0 in | Wt 236.0 lb

## 2017-03-26 DIAGNOSIS — Z510 Encounter for antineoplastic radiation therapy: Secondary | ICD-10-CM | POA: Diagnosis not present

## 2017-03-26 DIAGNOSIS — C3491 Malignant neoplasm of unspecified part of right bronchus or lung: Secondary | ICD-10-CM

## 2017-03-26 DIAGNOSIS — C3412 Malignant neoplasm of upper lobe, left bronchus or lung: Secondary | ICD-10-CM

## 2017-03-26 NOTE — Progress Notes (Signed)
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  Interval Since Last Radiation:  12 weeks  12/21/16-01/03/17: Right lung/ 30 Gy in 10 fractions  Narrative:  The patient returns today for re-consultation.  She was recently seen by oncologist Dr. Marin Olp on 03/20/2017 who recommended she consider additional radiotherapy as there was not enough tissue for PD-L1 testing and systemic therapy could not recommended without these results. Most recent PET on 02/05/2017 showed 1.9 cm left upper lobe pulmonary nodule hypermetabolic with a max SUV 96.7, favoring lung cancer. Also seen was low-grade activity along the right bronchus intermedius and infrahilar region where there is suspected to be some adenopathy, with max SUV only 4.6. She is here to discuss possible options of continued radiotherapy.   On review of systems, the patient states she is doing well overall. She presents in wheelchair, stating she was slightly unsteady due to restless leg syndrome. She reports her left leg feeling heavy. She denies pain. Her only complaint is hair loss and is not sure the cause. She reports regular bowels and overactive bladder at times. She reports shortness of breath with exertion and wears oxygen as needed at night. The patient saw PA at her Primary MD office today to have her thyroid checked.    ALLERGIES:  is allergic to iohexol; ciprofloxacin; flagyl [metronidazole]; and zosyn [piperacillin sod-tazobactam so].  Meds: Current Outpatient Prescriptions  Medication Sig Dispense Refill  . ADVAIR DISKUS 250-50 MCG/DOSE AEPB Inhale 1 puff into the lungs daily.    Marland Kitchen albuterol (PROVENTIL HFA;VENTOLIN HFA) 108 (90 Base) MCG/ACT inhaler Inhale 2 puffs into  the lungs every 6 (six) hours as needed for wheezing or shortness of breath.    Marland Kitchen amLODipine (NORVASC) 10 MG tablet Take 10 mg by mouth daily.    . Cholecalciferol (VITAMIN D PO) Take 5,000 Units by mouth daily.     . diphenhydrAMINE (BENADRYL) 25 mg capsule Take 25 mg by mouth every 4 (four) hours as needed for itching or allergies.    Marland Kitchen escitalopram (LEXAPRO) 10 MG tablet Take 10 mg by mouth daily.    . hydrOXYzine (ATARAX/VISTARIL) 25 MG tablet Take 25 mg by mouth 3 (three) times daily as needed for anxiety.    Marland Kitchen loratadine (CLARITIN) 10 MG tablet Take 1 tablet (10 mg total) by mouth daily. 30 tablet 11  . losartan-hydrochlorothiazide (HYZAAR) 100-25 MG tablet Take 1 tablet by mouth daily.    . metoprolol succinate (TOPROL-XL) 100 MG 24 hr tablet Take 100 mg by mouth daily. Take with or immediately following a meal.    . potassium chloride SA (K-DUR,KLOR-CON) 20 MEQ tablet Take 1 tablet (20 mEq total) by mouth daily.    Marland Kitchen Spacer/Aero-Holding Chambers (AEROCHAMBER MV) inhaler Use as instructed 1 each 0   No current facility-administered medications for this encounter.     Physical Findings:  height is _0  (1.702 m) and weight is 236 lb (107 kg). Her oral temperature is 97.8 F (36.6 C). Her blood pressure is 179/74 (abnormal) and her pulse is 76. Her respiration is 20 and oxygen saturation is 91%.  Pain Assessment Pain Score: 0-No pain/10 In general this is a well appearing caucasian female in no acute distress. She's  alert and oriented x4 and appropriate throughout the examination. She presents in wheelchair. Cardiopulmonary assessment is negative for acute distress and she exhibits normal effort.   Lab Findings: Lab Results  Component Value Date   WBC 12.3 (H) 03/20/2017   HGB 11.9 03/20/2017   HCT 35.1 03/20/2017   MCV 93 03/20/2017   PLT 333 03/20/2017     Radiographic Findings: No results found.  Impression/Plan: 1. Putative Stage IIIb NSCLC with invovlement of the left  upper lobe and right hilum. We discussed the natural history of squamous cell carcinoma and general treatment, highlighting the role of radiotherapy in the management.  We discussed the available radiation techniques, and focused on the details of logistics and delivery.  We reviewed the anticipated acute and late sequelae associated with radiation in this setting. We would anticipate 3-5 targeted treatments to the left pulmonary nodule. The patient is interested in having both the left and right lung treated. She understands the risk involved in re-treating the right lung. We will further assess the risk of re-treating the right lung with treatment planning. The patient will be scheduled for CT simulation.   ------------------------------------------------  Jodelle Gross, MD, PhD  This document serves as a record of services personally performed by Kyung Rudd, MD. It was created on his behalf by Arlyce Harman, a trained medical scribe. The creation of this record is based on the scribe's personal observations and the provider's statements to them. This document has been checked and approved by the attending provider.

## 2017-03-26 NOTE — Progress Notes (Signed)
Please see the Nurse Progress Note in the MD Initial Consult Encounter for this patient. 

## 2017-03-29 ENCOUNTER — Encounter: Payer: Self-pay | Admitting: *Deleted

## 2017-03-29 NOTE — Progress Notes (Signed)
[place] Psychosocial Distress Screening Clinical Social Work  Clinical Social Work was referred by distress screening protocol.  The patient scored a 6 on the Psychosocial Distress Thermometer which indicates moderate distress. Clinical Social Worker reviewed chart and phoned pt to assess for distress and other psychosocial needs. CSW left message: introduced self, explained role of CSW/Pt and Family Support Team, support groups and other resources to assist. CSW encouraged returned call.    ON[facility]N DISTRESS SCREENING 03/26/2017  Screening Type Initial Screening  Distress experienced in past week (1-10) 6  Practical problem type Transportation;Childcare  Emotional problem type Depression;Nervousness/Anxiety;Adjusting to appearance changes  Physical Problem type Getting around;Bathing/dressing;Skin dry/itchy;Breathing  Physician notified of physical symptoms Yes  Referral to clinical social work Yes    Clinical Social Worker follow up needed: Yes.    If yes, follow up plan:  CSW awaits return call Loren Racer, LCSW, OSW-C Clinical Social Worker [place]  [facility] Phone: [phone] Fax: [phone]

## 2017-04-01 ENCOUNTER — Ambulatory Visit: Payer: Medicare Other | Admitting: Radiation Oncology

## 2017-04-01 NOTE — Addendum Note (Signed)
Encounter addended by: Doreen Beam, RN on: 04/01/2017  9:55 AM<BR>    Actions taken: Charge Capture section accepted

## 2017-04-02 ENCOUNTER — Ambulatory Visit
Admission: RE | Admit: 2017-04-02 | Discharge: 2017-04-02 | Disposition: A | Discharge status: Home / Self Care | Payer: Medicare Other | Source: Ambulatory Visit | Attending: Radiation Oncology | Admitting: Radiation Oncology

## 2017-04-02 DIAGNOSIS — Z510 Encounter for antineoplastic radiation therapy: Secondary | ICD-10-CM | POA: Diagnosis not present

## 2017-04-10 DIAGNOSIS — Z510 Encounter for antineoplastic radiation therapy: Secondary | ICD-10-CM | POA: Diagnosis not present

## 2017-04-11 ENCOUNTER — Ambulatory Visit: Payer: Medicare Other | Admitting: Family

## 2017-04-11 ENCOUNTER — Other Ambulatory Visit: Payer: Medicare Other

## 2017-04-11 ENCOUNTER — Ambulatory Visit
Admission: RE | Admit: 2017-04-11 | Discharge: 2017-04-11 | Disposition: A | Discharge status: Home / Self Care | Payer: Medicare Other | Source: Ambulatory Visit | Attending: Radiation Oncology | Admitting: Radiation Oncology

## 2017-04-11 DIAGNOSIS — Z510 Encounter for antineoplastic radiation therapy: Secondary | ICD-10-CM | POA: Diagnosis not present

## 2017-04-12 ENCOUNTER — Ambulatory Visit
Admission: RE | Admit: 2017-04-12 | Discharge: 2017-04-12 | Disposition: A | Discharge status: Home / Self Care | Payer: Medicare Other | Source: Ambulatory Visit | Attending: Radiation Oncology | Admitting: Radiation Oncology

## 2017-04-12 ENCOUNTER — Ambulatory Visit: Payer: Medicare Other | Admitting: Radiation Oncology

## 2017-04-12 DIAGNOSIS — Z510 Encounter for antineoplastic radiation therapy: Secondary | ICD-10-CM | POA: Diagnosis not present

## 2017-04-15 ENCOUNTER — Ambulatory Visit
Admission: RE | Admit: 2017-04-15 | Discharge: 2017-04-15 | Disposition: A | Discharge status: Home / Self Care | Payer: Medicare Other | Source: Ambulatory Visit | Attending: Radiation Oncology | Admitting: Radiation Oncology

## 2017-04-15 DIAGNOSIS — Z510 Encounter for antineoplastic radiation therapy: Secondary | ICD-10-CM | POA: Diagnosis not present

## 2017-04-16 ENCOUNTER — Ambulatory Visit
Admission: RE | Admit: 2017-04-16 | Discharge: 2017-04-16 | Disposition: A | Discharge status: Home / Self Care | Payer: Medicare Other | Source: Ambulatory Visit | Attending: Radiation Oncology | Admitting: Radiation Oncology

## 2017-04-16 DIAGNOSIS — Z510 Encounter for antineoplastic radiation therapy: Secondary | ICD-10-CM | POA: Diagnosis not present

## 2017-04-17 ENCOUNTER — Ambulatory Visit
Admission: RE | Admit: 2017-04-17 | Discharge: 2017-04-17 | Disposition: A | Discharge status: Home / Self Care | Payer: Medicare Other | Source: Ambulatory Visit | Attending: Radiation Oncology | Admitting: Radiation Oncology

## 2017-04-17 DIAGNOSIS — Z510 Encounter for antineoplastic radiation therapy: Secondary | ICD-10-CM | POA: Diagnosis not present

## 2017-04-18 ENCOUNTER — Ambulatory Visit
Admission: RE | Admit: 2017-04-18 | Discharge: 2017-04-18 | Disposition: A | Discharge status: Home / Self Care | Payer: Medicare Other | Source: Ambulatory Visit | Attending: Radiation Oncology | Admitting: Radiation Oncology

## 2017-04-18 DIAGNOSIS — Z510 Encounter for antineoplastic radiation therapy: Secondary | ICD-10-CM | POA: Diagnosis not present

## 2017-04-19 ENCOUNTER — Ambulatory Visit
Admission: RE | Admit: 2017-04-19 | Discharge: 2017-04-19 | Disposition: A | Discharge status: Home / Self Care | Payer: Medicare Other | Source: Ambulatory Visit | Attending: Radiation Oncology | Admitting: Radiation Oncology

## 2017-04-19 DIAGNOSIS — Z510 Encounter for antineoplastic radiation therapy: Secondary | ICD-10-CM | POA: Diagnosis not present

## 2017-04-22 ENCOUNTER — Encounter: Payer: Self-pay | Admitting: Radiation Oncology

## 2017-04-22 ENCOUNTER — Ambulatory Visit
Admission: RE | Admit: 2017-04-22 | Discharge: 2017-04-22 | Disposition: A | Discharge status: Home / Self Care | Payer: Medicare Other | Source: Ambulatory Visit | Attending: Radiation Oncology | Admitting: Radiation Oncology

## 2017-04-22 DIAGNOSIS — Z510 Encounter for antineoplastic radiation therapy: Secondary | ICD-10-CM | POA: Diagnosis not present

## 2017-04-24 NOTE — Progress Notes (Signed)
  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     Indication for treatment:  palliative       Radiation treatment dates:   04/11/2017 to 04/22/2017  Site/dose:   The lung was treated to 24 Gy in 8 fractions at 3 Gy per fraction.   Beams/energy:   3D // 10X, 6X  Narrative: The patient tolerated radiation treatment relatively well.   The patient was given oxygen therapy via nasal cannula when her O2 sats were low in clinic. Patient stated she has no working oxygen in her car. She experienced profuse vomitting morning of 7/19. Patient stated she is very fatigued.  Plan: The patient has completed radiation treatment. The patient will return to radiation oncology clinic for routine followup in one month. I advised them to call or return sooner if they have any questions or concerns related to their recovery or treatment.  ------------------------------------------------  Jodelle Gross, MD, PhD  This document serves as a record of services personally performed by Kyung Rudd, MD. It was created on his behalf by Arlyce Harman, a trained medical scribe. The creation of this record is based on the scribe's personal observations and the provider's statements to them. This document has been checked and approved by the attending provider.

## 2017-04-29 ENCOUNTER — Telehealth: Payer: Self-pay

## 2017-04-29 MED ORDER — AMLODIPINE BESYLATE 10 MG PO TABS: 10.0000 mg | ORAL_TABLET | Freq: Every day | ORAL | 3 refills | Status: DC

## 2017-04-29 NOTE — Telephone Encounter (Signed)
Call transferred into triage from the refill department. Patient requesting a refill on amlodipine be sent to [facility]. Refill sent in.  Patient also calling to let Dr. Tamala Julian aware that she is taking metoprolol succinate 100 mg: Take 1.5 tablets once daily (150 mg total QD). If Dr. Tamala Julian is okay with this she will need a refill sent to [facility] as well. Message routed to Dr. Tamala Julian for review and recommendation.

## 2017-04-30 ENCOUNTER — Encounter: Payer: Self-pay | Admitting: Hematology & Oncology

## 2017-05-01 MED ORDER — METOPROLOL SUCCINATE ER 100 MG PO TB24: ORAL_TABLET | ORAL | 3 refills | Status: DC

## 2017-05-01 NOTE — Telephone Encounter (Signed)
Okay to both refills

## 2017-05-21 ENCOUNTER — Other Ambulatory Visit: Payer: Self-pay | Admitting: Interventional Cardiology

## 2017-05-21 MED ORDER — METOPROLOL SUCCINATE ER 100 MG PO TB24: ORAL_TABLET | ORAL | 3 refills | Status: DC

## 2017-05-21 MED ORDER — METOPROLOL SUCCINATE ER 100 MG PO TB24: ORAL_TABLET | ORAL | 2 refills | Status: DC

## 2017-05-27 ENCOUNTER — Other Ambulatory Visit: Payer: Medicare Other

## 2017-05-27 ENCOUNTER — Ambulatory Visit: Payer: Medicare Other | Admitting: Hematology & Oncology

## 2017-05-29 ENCOUNTER — Encounter: Payer: Self-pay | Admitting: Pulmonary Disease

## 2017-05-29 ENCOUNTER — Ambulatory Visit (INDEPENDENT_AMBULATORY_CARE_PROVIDER_SITE_OTHER): Payer: Medicare Other | Admitting: Pulmonary Disease

## 2017-05-29 ENCOUNTER — Ambulatory Visit
Admission: RE | Admit: 2017-05-29 | Discharge: 2017-05-29 | Disposition: A | Discharge status: Home / Self Care | Payer: Medicare Other | Source: Ambulatory Visit | Attending: Radiation Oncology | Admitting: Radiation Oncology

## 2017-05-29 ENCOUNTER — Other Ambulatory Visit: Payer: Medicare Other

## 2017-05-29 VITALS — BP 138/80 | HR 86 | Ht 67.0 in | Wt 238.8 lb

## 2017-05-29 DIAGNOSIS — I700 Atherosclerosis of aorta: Secondary | ICD-10-CM | POA: Diagnosis not present

## 2017-05-29 DIAGNOSIS — Z79899 Other long term (current) drug therapy: Secondary | ICD-10-CM | POA: Diagnosis not present

## 2017-05-29 DIAGNOSIS — Z510 Encounter for antineoplastic radiation therapy: Secondary | ICD-10-CM | POA: Insufficient documentation

## 2017-05-29 DIAGNOSIS — Z880 Allergy status to penicillin: Secondary | ICD-10-CM | POA: Insufficient documentation

## 2017-05-29 DIAGNOSIS — C3412 Malignant neoplasm of upper lobe, left bronchus or lung: Secondary | ICD-10-CM | POA: Insufficient documentation

## 2017-05-29 DIAGNOSIS — J449 Chronic obstructive pulmonary disease, unspecified: Secondary | ICD-10-CM

## 2017-05-29 DIAGNOSIS — J9611 Chronic respiratory failure with hypoxia: Secondary | ICD-10-CM | POA: Diagnosis not present

## 2017-05-29 DIAGNOSIS — C3491 Malignant neoplasm of unspecified part of right bronchus or lung: Secondary | ICD-10-CM | POA: Diagnosis not present

## 2017-05-29 DIAGNOSIS — Z23 Encounter for immunization: Secondary | ICD-10-CM

## 2017-05-29 DIAGNOSIS — Z9981 Dependence on supplemental oxygen: Secondary | ICD-10-CM | POA: Insufficient documentation

## 2017-05-29 DIAGNOSIS — R0602 Shortness of breath: Secondary | ICD-10-CM | POA: Insufficient documentation

## 2017-05-29 MED ORDER — FLUTICASONE-UMECLIDIN-VILANT 100-62.5-25 MCG/INH IN AEPB: 1.0000 | INHALATION_SPRAY | Freq: Every day | RESPIRATORY_TRACT | 0 refills | Status: DC

## 2017-05-29 MED ORDER — FLUTICASONE-UMECLIDIN-VILANT 100-62.5-25 MCG/INH IN AEPB: 1.0000 | INHALATION_SPRAY | Freq: Every day | RESPIRATORY_TRACT | 6 refills | Status: DC

## 2017-05-29 NOTE — Patient Instructions (Addendum)
   Use the Trelegy inhaler in place of your Advair. Do 1 inhalation of this inhaler once daily.  Remember to remove any dentures or partials you have before you use your inhaler. Remember to brush your teeth & tongue after you use your inhaler as well as rinse, gargle & spit to keep from getting thrush in your mouth or on your tongue (a white film).   You can continue to use your Albuterol inhaler as needed.  I'm putting in a referral to Pulmonary Rehab at [facility] to help improve your endurance.  Call me if you have any new breathing problems or questions before your next appointment.  Use oxygen at  2 L/m at rest and with exertion. We may need to increase this in the future.  TESTS ORDERED: 1. Serum Alpha-1 Antitrypsin Phenotype today 2. Full PFTs on or before follow-up appointment 3. 6MWT with oxygen titration on or before next appointment

## 2017-05-29 NOTE — Progress Notes (Signed)
Subjective:    Patient ID: Alisha Fisher, female    DOB: 08-Nov-1945, 71 y.o.   MRN: [id number]  C.C.:  Follow-up for Severe COPD w/ Emphysema, NSCLC, & Chronic Hypoxic Respiratory Failure.  HPI Severe COPD w/ Emphysema: Previously prescribed Advair. She is adherent to her Advair. She is using her rescue inhaler up to 2 times daily. No exacerbations since last appointment. She reports significant fatigue with exertion. Intermittent cough producing a varying phlegm. No wheezing.   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 Following with medical oncology and radiation oncology. Status post treatment with palliative XRT.  Chronic Hypoxic Respiratory Failure:  Patient hasn't been evaluated for a POC but didn't quality per [facility]. She is not consistently using her portable oxygen tanks due to weight and carrying them. She has been using varying flow rates depending on her dyspnea. She has been using up to 3 L/m.   Review of Systems No chest pressure or tightness. No recent chest pain. No fever or chills. She has had intermittent reflux & dyspepsia with her XRT previously. No dysphagia or odynophagia.   Allergies  Allergen Reactions  . Iohexol Rash    Pt has had two previous ct scans with a reaction: hives.  . Ciprofloxacin Rash    Rash possibly caused by Cipro?  . Flagyl [Metronidazole] Rash    Rash possibly caused by Flagyl?  Marland Kitchen Zosyn [Piperacillin Sod-Tazobactam So] Itching and Rash    Has patient had a PCN reaction causing immediate rash, facial/tongue/throat swelling, SOB or lightheadedness with hypotension: No Has patient had a PCN reaction causing severe rash involving mucus membranes or skin necrosis: No Has patient had a PCN reaction that required hospitalization: No Has patient had a PCN reaction occurring within the last 10 years: Yes If all of the above answers are "NO", then may proceed with Cephalosporin use.      Current Outpatient Prescriptions on File Prior to Visit  Medication Sig Dispense Refill  . ADVAIR DISKUS 250-50 MCG/DOSE AEPB Inhale 1 puff into the lungs daily.    Marland Kitchen albuterol (PROVENTIL HFA;VENTOLIN HFA) 108 (90 Base) MCG/ACT inhaler Inhale 2 puffs into the lungs every 6 (six) hours as needed for wheezing or shortness of breath.    Marland Kitchen amLODipine (NORVASC) 10 MG tablet Take 1 tablet (10 mg total) by mouth daily. 90 tablet 3  . Cholecalciferol (VITAMIN D PO) Take 5,000 Units by mouth daily.     . diphenhydrAMINE (BENADRYL) 25 mg capsule Take 25 mg by mouth every 4 (four) hours as needed for itching or allergies.    Marland Kitchen escitalopram (LEXAPRO) 10 MG tablet Take 10 mg by mouth daily.    . hydrOXYzine (ATARAX/VISTARIL) 25 MG tablet Take 25 mg by mouth 3 (three) times daily as needed for anxiety.    Marland Kitchen loratadine (CLARITIN) 10 MG tablet Take 1 tablet (10 mg total) by mouth daily. (Patient taking differently: Take 10 mg by mouth daily as needed. ) 30 tablet 11  . losartan-hydrochlorothiazide (HYZAAR) 100-25 MG tablet Take 1 tablet by mouth daily.    . metoprolol succinate (TOPROL-XL) 100 MG 24 hr tablet Take 1.5 tablets (150 mg) daily with or immediately following a meal. 135 tablet 2  . Spacer/Aero-Holding Chambers (AEROCHAMBER MV) inhaler Use as instructed 1 each 0  . potassium chloride SA (K-DUR,KLOR-CON) 20 MEQ tablet Take 1 tablet (20 mEq total) by mouth daily. (Patient not taking: Reported on 05/29/2017)  No current facility-administered medications on file prior to visit.     Past Medical History:  Diagnosis Date  . COPD, severe ([place])   . HTN (hypertension)   . Pneumonia   . Pulmonary emphysema ([place])   . SOB (shortness of breath)     Past Surgical History:  Procedure Laterality Date  . CATARACT EXTRACTION    . ENDOBRONCHIAL ULTRASOUND Bilateral 12/17/2016   Procedure: ENDOBRONCHIAL ULTRASOUND;  Surgeon: Javier Glazier, MD;  Location: [facility];  Service: Cardiopulmonary;   Laterality: Bilateral;  . IR GENERIC HISTORICAL  12/21/2016   IR SINUS/FIST TUBE CHK-NON GI 12/21/2016 [facility]  . IR GENERIC HISTORICAL  12/21/2016   IR US GUIDE BX ASP/DRAIN 12/21/2016 Corrie Mckusick, DO [facility]  . IR RADIOLOGIST EVAL & MGMT  01/17/2017    Family History  Problem Relation Age of Onset  . Allergies Unknown   . COPD Cousin   . Breast cancer Mother   . Diabetes Father   . Diabetes Son     Social History   Social History  . Marital status: Widowed    Spouse name: N/A  . Number of children: N/A  . Years of education: N/A   Occupational History  . retired    Social History Main Topics  . Smoking status: Former Smoker    Packs/day: 0.50    Years: 50.00    Types: Cigarettes    Quit date: 11/01/2016  . Smokeless tobacco: Never Used  . Alcohol use No  . Drug use: No  . Sexual activity: Not Asked   Other Topics Concern  . None   Social History Narrative   [facility] (12/10/16):   Patient's widower son and grandchildren moved in with her. Her husband passed years ago. Previously worked in [employer].      Objective:   Physical Exam BP 138/80 (BP Location: Left Arm, Cuff Size: Normal)   Pulse 86   Ht 5\' 7"  (1.702 m)   Wt 238 lb 12.8 oz (108.3 kg)   SpO2 97% Comment: arrived at 80% on room air  BMI 37.40 kg/m  General:  Awake. Alert. Mild obesity. Integument:  Warm & dry. No rash on exposed skin. No bruising on exposed skin. Extremities:  No cyanosis or clubbing.  HEENT:  Moist mucus membranes. No significant nasal turbinate swelling. No oral ulcers Cardiovascular:  Regular rate. No edema. Regular rhythm.  Pulmonary:  Some improvement in aeration within her right lower lung. Mild end expiratory wheezes and squeaks bilaterally. Normal work of breathing on room air. Abdomen: Soft. Normal bowel sounds. Mildly protuberant. Musculoskeletal:  Normal bulk and tone. No joint deformity or effusion appreciated.  PFT  04/22/12:FVC 1.90 L (67%) FEV1 1.14  L (47%) FEV1/FVC 0.60 FEF 25-75 0.44 L (70%) negative bronchodilator response TLC 4.09 L (75%) RV 100% ERV 30% DLCO uncorrected 63%  6MWT 05/29/17:  Baseline Sat 83% on room air - Saturation improved to 94% on 2 L/m but patient only able to walk from chair to door.  IMAGING PET CT 02/05/17 (per radiologist): IMPRESSION: 1. The 1.9 cm left upper lobe pulmonary nodule is hypermetabolic with a maximum SUV of 12.3, favoring lung cancer. 2. Low-grade activity along the right bronchus intermedius and infrahilar region where there is suspected to be some adenopathy. Difficult to exclude a low-grade tumor in this vicinity such as bronchial carcinoid tumor or similar, although the maximum SUV is only 4.6. 3. Aortic Atherosclerosis (ICD10-I70.0) and Emphysema (ICD10-J43.9). 4. Mild to moderate cardiomegaly with  coronary atherosclerosis. 5. Trace bilateral pleural effusions. 6. Hepatic morphology raises the possibility of early cirrhosis.  CT CHEST W/O 12/08/16 (previously reviewed by me): Endobronchial obstruction with cut off in the right lower lobe and some evidence of obstruction of the right middle lobe bronchus. Subsequent collapse versus consolidation of right lower lobe and associated pleural effusion. Small left pleural effusion as well. Left-sided nodule which is spiculated. Pathologically enlarged subcarinal as well as one precarinal lymph node. Apical predominant emphysematous changes noted.  PATHOLOGY Endobronchial Brush Right Mainstem (12/17/16): Squamous cell carcinoma Endobronchial FNA Bronchus Intermedius (12/17/16):  Malignant cells consistent with non-small cell carcinoma EBUS Level 7 (12/17/16):  No malignant cells EBUS Level 11R (12/17/16):  Atypical cells suspicious for non-small cell carcinoma    Assessment & Plan:  71 y.o. female previously seen by me and diagnosed with stage IIIB non-small cell lung cancer. Completed initial course of radiation. Previous spirometry again reviewed  showing severe airway obstruction. Given her ongoing dyspnea and symptoms I feel she would benefit from adjustment in her inhaler regimen and inclusion of a LAMA. Patient instructed to use oxygen at 2 L/m given the findings noted above today. I instructed the patient to contact my office if she had any new breathing problems or questions before her next appointment.  1. Severe COPD with emphysema:  Referring patient to pulmonary rehabilitation. Screening for alpha-1 antitrypsin deficiency. Switching from Advair to Trelegy Ellipta. Repeat full pulmonary function testing on her before next appointment. 2. NSCLC: Has follow-up later today with radiation oncology. 3. Chronic hypoxic respiratory failure: Investigating why the patient has not yet undergone evaluation for a portable oxygen concentrator. Patient to continue to use oxygen 2 L/m 24 hours a day. Checking a 6 minute walk test with oxygen titration on her before next appointment. 4. Health maintenance:  Previously had a Pneumonia Vaccine. Administering Prevnar Vaccine today. Recommended high-dose influenza vaccine next month. 5. Follow-up: Return to clinic in 3 months or sooner if needed.  Sonia Baller Ashok Cordia, M.D. [facility] Pager:  [phone] After 3pm or if no response, call [phone] 12:36 PM 05/29/17

## 2017-06-01 LAB — ALPHA-1 ANTITRYPSIN PHENOTYPE: A-1 Antitrypsin: 187 mg/dL (ref 83–199)

## 2017-06-04 NOTE — Progress Notes (Signed)
[facility] Radiation Oncology Simulation and Treatment Planning Note   Name:  Veida Spira MRN: [id number]   Date: 06/04/2017  DOB: 04-14-1946  Status:outpatient    DIAGNOSIS:    ICD-10-CM   1. Malignant neoplasm of bronchus of left upper lobe (HCC) C34.12      CONSENT VERIFIED:yes   SET UP: Patient is setup supine   IMMOBILIZATION: The patient was immobilized using a Vac Loc bag and accuform device.   NARRATIVE:The patient was brought to the [facility].  Identity was confirmed.  All relevant records and images related to the planned course of therapy were reviewed.  Then, the patient was positioned in a stable reproducible clinical set-up for radiation therapy.  4D CT images were obtained and reproducible breathing pattern was confirmed. Free breathing CT images were obtained.  Skin markings were placed.  The CT images were loaded into the planning software where the target and avoidance structures were contoured.  The radiation prescription was entered and confirmed.    TREATMENT PLANNING NOTE:  Treatment planning then occurred. I have requested : MLC's, isodose plan, basic dose calculation.  3 dimensional simulation is performed and dose volume histogram of the gross tumor volume, planning tumor volume and criticial normal structures including the spinal cord and lungs were analyzed and requested.  Special treatment procedure was performed due to high dose per fraction.  The patient will be monitored for increased risk of toxicity.  Daily imaging using cone beam CT will be used for target localization.  I anticipate that the patient will receive 54 Gy in 3 fractions to target volume. Further adjustments will be made based on the planning process is necessary.  ------------------------------------------------  Jodelle Gross, MD, PhD

## 2017-06-05 ENCOUNTER — Other Ambulatory Visit: Payer: Self-pay | Admitting: *Deleted

## 2017-06-05 ENCOUNTER — Telehealth: Payer: Self-pay | Admitting: Pulmonary Disease

## 2017-06-05 MED ORDER — UMECLIDINIUM-VILANTEROL 62.5-25 MCG/INH IN AEPB: 1.0000 | INHALATION_SPRAY | Freq: Every day | RESPIRATORY_TRACT | 3 refills | Status: DC

## 2017-06-05 NOTE — Telephone Encounter (Signed)
Please switch her to Anoro - 1 inhalation once daily - #1 with 3 refills. Thanks.

## 2017-06-05 NOTE — Telephone Encounter (Signed)
Spoke with pt, aware of rx change.  rx sent to preferred pharmacy.  Nothing further needed.

## 2017-06-05 NOTE — Telephone Encounter (Signed)
Received a fax from [facility]. Pt's insurance does not cover Trelegy. Covered alternatives are Anoro and Bevespi.  JN - please advise. Thanks.

## 2017-06-07 ENCOUNTER — Ambulatory Visit: Payer: Medicare Other | Admitting: Radiation Oncology

## 2017-06-07 DIAGNOSIS — Z510 Encounter for antineoplastic radiation therapy: Secondary | ICD-10-CM | POA: Diagnosis not present

## 2017-06-11 ENCOUNTER — Ambulatory Visit
Admission: RE | Admit: 2017-06-11 | Discharge: 2017-06-11 | Disposition: A | Discharge status: Home / Self Care | Payer: Medicare Other | Source: Ambulatory Visit | Attending: Radiation Oncology | Admitting: Radiation Oncology

## 2017-06-11 DIAGNOSIS — Z510 Encounter for antineoplastic radiation therapy: Secondary | ICD-10-CM | POA: Diagnosis not present

## 2017-06-13 ENCOUNTER — Telehealth: Payer: Self-pay | Admitting: Pulmonary Disease

## 2017-06-13 DIAGNOSIS — J439 Emphysema, unspecified: Secondary | ICD-10-CM

## 2017-06-13 NOTE — Telephone Encounter (Signed)
We can follow up on this message on monday.

## 2017-06-13 NOTE — Telephone Encounter (Signed)
pulm rehab is closed. JN please advise on what other dx can be used for pt's pulm rehab referral- pt does not have a PFT on file so COPD cannot be used.  Thanks.

## 2017-06-13 NOTE — Telephone Encounter (Signed)
Patient has PFTs from 2013 documented in my note so COPD can be used because she had severe airways obstruction. Thanks.

## 2017-06-14 ENCOUNTER — Ambulatory Visit: Payer: Medicare Other | Admitting: Radiation Oncology

## 2017-06-17 ENCOUNTER — Ambulatory Visit: Payer: Medicare Other | Admitting: Radiation Oncology

## 2017-06-17 NOTE — Telephone Encounter (Signed)
Will chronic hypoxic respiratory failure or lung cancer suffice? She also has Pulmonary Emphysema. J.

## 2017-06-17 NOTE — Telephone Encounter (Signed)
Spoke with pulmonary rehab and they state that any of those dx codes will work.  New order placed with dx of Pulmonary Emphysema.  Nothing further needed.

## 2017-06-17 NOTE — Telephone Encounter (Signed)
Spoke with Alisha Fisher at pulmonary rehab.  She states that per their medical director policy , PFT's cannot be over 69 years old to qualify for pulmonary rehab.  Her PFT is 2 months past the 5 year mark.  She states another dx can be used or either pt can have a new PFT done.  Please advise.

## 2017-06-18 ENCOUNTER — Ambulatory Visit: Payer: Medicare Other | Admitting: Radiation Oncology

## 2017-06-19 ENCOUNTER — Ambulatory Visit
Admission: RE | Admit: 2017-06-19 | Discharge: 2017-06-19 | Disposition: A | Discharge status: Home / Self Care | Payer: Medicare Other | Source: Ambulatory Visit | Attending: Radiation Oncology | Admitting: Radiation Oncology

## 2017-06-19 DIAGNOSIS — Z510 Encounter for antineoplastic radiation therapy: Secondary | ICD-10-CM | POA: Diagnosis not present

## 2017-06-20 ENCOUNTER — Telehealth: Payer: Self-pay | Admitting: Pulmonary Disease

## 2017-06-20 MED ORDER — ALBUTEROL SULFATE HFA 108 (90 BASE) MCG/ACT IN AERS: 2.0000 | INHALATION_SPRAY | Freq: Four times a day (QID) | RESPIRATORY_TRACT | 1 refills | Status: AC | PRN

## 2017-06-20 NOTE — Telephone Encounter (Signed)
Called and spoke with pt. Pt is requesting refill for Ventolin, as she has misplaced her inhaler. Rx has been sent to preferred pharmacy. Nothing further needed.

## 2017-06-21 ENCOUNTER — Ambulatory Visit
Admission: RE | Admit: 2017-06-21 | Discharge: 2017-06-21 | Disposition: A | Discharge status: Home / Self Care | Payer: Medicare Other | Source: Ambulatory Visit | Attending: Radiation Oncology | Admitting: Radiation Oncology

## 2017-06-21 DIAGNOSIS — Z510 Encounter for antineoplastic radiation therapy: Secondary | ICD-10-CM | POA: Diagnosis not present

## 2017-06-24 ENCOUNTER — Ambulatory Visit
Admission: RE | Admit: 2017-06-24 | Discharge: 2017-06-24 | Disposition: A | Discharge status: Home / Self Care | Payer: Medicare Other | Source: Ambulatory Visit | Attending: Radiation Oncology | Admitting: Radiation Oncology

## 2017-06-24 DIAGNOSIS — Z510 Encounter for antineoplastic radiation therapy: Secondary | ICD-10-CM | POA: Diagnosis not present

## 2017-06-26 ENCOUNTER — Ambulatory Visit
Admission: RE | Admit: 2017-06-26 | Discharge: 2017-06-26 | Disposition: A | Discharge status: Home / Self Care | Payer: Medicare Other | Source: Ambulatory Visit | Attending: Radiation Oncology | Admitting: Radiation Oncology

## 2017-06-26 ENCOUNTER — Other Ambulatory Visit: Payer: Self-pay | Admitting: Radiation Oncology

## 2017-06-26 DIAGNOSIS — Z510 Encounter for antineoplastic radiation therapy: Secondary | ICD-10-CM | POA: Diagnosis not present

## 2017-06-26 DIAGNOSIS — C3412 Malignant neoplasm of upper lobe, left bronchus or lung: Secondary | ICD-10-CM

## 2017-06-27 ENCOUNTER — Encounter: Payer: Self-pay | Admitting: Radiation Oncology

## 2017-06-27 ENCOUNTER — Telehealth: Payer: Self-pay | Admitting: *Deleted

## 2017-06-27 NOTE — Telephone Encounter (Signed)
CALLED PATIENT TO INFORM OF STAT LABS ON 08-01-17 @ [facility] AND HER CT ON 08-01-17- ARRIVAL TIME - 11:45 AM @ [facility], PT. TO HAVE CLEAR LIQUIDS ONLY - 4 HRS. PRIOR TO TEST, SPOKE WITH PATIENT AND SHE IS AWARE OF THESE APPTS.

## 2017-06-27 NOTE — Progress Notes (Signed)
  Radiation Oncology         [phone]) [phone] ________________________________  Name: Alisha Fisher MRN: [id number]  Date: 06/27/2017  DOB: 02-May-1946  End of Treatment Note  Diagnosis: -  Malignant neoplasm of middle lobe, bronchus or lung                    -   Malignant neoplasm of upper lobe, left bronchus or lung  Indication for treatment: Curative     Radiation treatment dates:   06/11/2017 ,06/19/2017, 06/21/2017, 06/24/2017, 06/26/2017  Site/dose:  Lung/ 50 Gy in 5 fractions   Beams/energy:   SBRT/ 6X  Narrative: The patient tolerated radiation treatment relatively well.    Plan: The patient has completed radiation treatment. The patient will return to radiation oncology clinic for routine followup in one month. I advised them to call or return sooner if they have any questions or concerns related to their recovery or treatment.  ------------------------------------------------  Jodelle Gross, MD, PhD  This document serves as a record of services personally performed by Kyung Rudd, MD. It was created on his behalf by Valeta Harms, a trained medical scribe. The creation of this record is based on the scribe's personal observations and the provider's statements to them. This document has been checked and approved by the attending provider.

## 2017-07-10 ENCOUNTER — Telehealth (HOSPITAL_COMMUNITY): Payer: Self-pay

## 2017-07-10 NOTE — Telephone Encounter (Signed)
Spoke with patient on 07/05/2017, stated she didn't have time to talk - said to call back next week.

## 2017-07-16 NOTE — Telephone Encounter (Signed)
Called patient 4 times - LMTCB twice - Patient stated she has no time to talk the other two times we have called. Letter was sent. No response from patient - Closed referral.

## 2017-07-30 ENCOUNTER — Telehealth: Payer: Self-pay

## 2017-07-30 NOTE — Telephone Encounter (Signed)
Transferred patient radio;ogy for rescheduling her appointment. Per 10/30 phone que

## 2017-08-01 ENCOUNTER — Ambulatory Visit (HOSPITAL_COMMUNITY): Payer: Medicare Other

## 2017-08-01 ENCOUNTER — Ambulatory Visit: Payer: Medicare Other

## 2017-08-02 ENCOUNTER — Telehealth: Payer: Self-pay | Admitting: *Deleted

## 2017-08-02 NOTE — Telephone Encounter (Signed)
CALLED PATIENT TO INFORM OF LAB ON 08-07-17 @ 2:45 PM @ [facility] AND HER CT ON 08-07-17 - ARRIVAL TIME - 3:45 PM @ [facility], PT. TO HAVE CLEAR LIQUIDS ONLY- 4 HRS. PRIOR TO TEST, SPOKE WITH PATIENT AND SHE IS AWARE OF THESE APPTS.

## 2017-08-02 NOTE — Progress Notes (Signed)
  Radiation Oncology         [phone]) [phone] ________________________________  Name: Alisha Fisher MRN: [id number]  Date: 05/29/2017  DOB: May 29, 1946  RESPIRATORY MOTION MANAGEMENT SIMULATION  NARRATIVE:  In order to account for effect of respiratory motion on target structures and other organs in the planning and delivery of radiotherapy, this patient underwent respiratory motion management simulation.  To accomplish this, when the patient was brought to the CT simulation planning suite, 4D respiratoy motion management CT images were obtained.  The CT images were loaded into the planning software.  Then, using a variety of tools including Cine, MIP, and standard views, the target volume and planning target volumes (PTV) were delineated.  Avoidance structures were contoured.  Treatment planning then occurred.  Dose volume histograms were generated and reviewed for each of the requested structure.  The resulting plan was carefully reviewed and approved today.  ------------------------------------------------  Jodelle Gross, MD, PhD

## 2017-08-02 NOTE — Addendum Note (Signed)
Encounter addended by: Kyung Rudd, MD on: 08/02/2017 11:21 AM<BR>    Actions taken: Sign clinical note

## 2017-08-05 ENCOUNTER — Ambulatory Visit: Admission: RE | Admit: 2017-08-05 | Payer: Medicare Other | Source: Ambulatory Visit | Admitting: Radiation Oncology

## 2017-08-05 ENCOUNTER — Telehealth: Payer: Self-pay | Admitting: *Deleted

## 2017-08-05 NOTE — Telephone Encounter (Signed)
Called patient to inform of fu appt. On 08-08-17 @ 9:30 am, lvm for a return call

## 2017-08-06 ENCOUNTER — Other Ambulatory Visit: Payer: Self-pay | Admitting: Interventional Cardiology

## 2017-08-06 MED ORDER — LOSARTAN POTASSIUM-HCTZ 100-25 MG PO TABS: 1.0000 | ORAL_TABLET | Freq: Every day | ORAL | 0 refills | Status: DC

## 2017-08-06 MED ORDER — METOPROLOL SUCCINATE ER 100 MG PO TB24: ORAL_TABLET | ORAL | 0 refills | Status: DC

## 2017-08-06 MED ORDER — LOSARTAN POTASSIUM-HCTZ 100-25 MG PO TABS: 1.0000 | ORAL_TABLET | Freq: Every day | ORAL | 1 refills | Status: DC

## 2017-08-06 MED ORDER — AMLODIPINE BESYLATE 10 MG PO TABS: 10.0000 mg | ORAL_TABLET | Freq: Every day | ORAL | 0 refills | Status: DC

## 2017-08-06 NOTE — Telephone Encounter (Signed)
Pt called stating that she lost her medications and needed a 10 day supply sent in to [facility] until mail order arrives. Pt's medications were sent in as requested and her losartan-HCTZ was sent also to [facility]. Confirmation received.

## 2017-08-07 ENCOUNTER — Ambulatory Visit (HOSPITAL_COMMUNITY)
Admission: RE | Admit: 2017-08-07 | Discharge: 2017-08-07 | Disposition: A | Discharge status: Home / Self Care | Payer: Medicare Other | Source: Ambulatory Visit | Attending: Radiation Oncology | Admitting: Radiation Oncology

## 2017-08-07 ENCOUNTER — Ambulatory Visit
Admission: RE | Admit: 2017-08-07 | Discharge: 2017-08-07 | Disposition: A | Discharge status: Home / Self Care | Payer: Medicare Other | Source: Ambulatory Visit | Attending: Radiation Oncology | Admitting: Radiation Oncology

## 2017-08-07 ENCOUNTER — Other Ambulatory Visit: Payer: Self-pay | Admitting: Radiation Oncology

## 2017-08-07 DIAGNOSIS — C3412 Malignant neoplasm of upper lobe, left bronchus or lung: Secondary | ICD-10-CM

## 2017-08-07 DIAGNOSIS — Y842 Radiological procedure and radiotherapy as the cause of abnormal reaction of the patient, or of later complication, without mention of misadventure at the time of the procedure: Secondary | ICD-10-CM | POA: Diagnosis not present

## 2017-08-07 DIAGNOSIS — I700 Atherosclerosis of aorta: Secondary | ICD-10-CM | POA: Diagnosis not present

## 2017-08-07 DIAGNOSIS — R590 Localized enlarged lymph nodes: Secondary | ICD-10-CM | POA: Insufficient documentation

## 2017-08-07 DIAGNOSIS — J439 Emphysema, unspecified: Secondary | ICD-10-CM | POA: Diagnosis not present

## 2017-08-07 LAB — BUN AND CREATININE (CC13)
BUN: 15.4 mg/dL (ref 7.0–26.0)
Creatinine: 0.8 mg/dL (ref 0.6–1.1)

## 2017-08-07 MED ORDER — IOPAMIDOL (ISOVUE-300) INJECTION 61%
INTRAVENOUS | Status: AC
  Filled 2017-08-07: qty 75

## 2017-08-08 ENCOUNTER — Ambulatory Visit
Admission: RE | Admit: 2017-08-08 | Discharge: 2017-08-08 | Disposition: A | Discharge status: Home / Self Care | Payer: Medicare Other | Source: Ambulatory Visit | Attending: Radiation Oncology | Admitting: Radiation Oncology

## 2017-08-08 ENCOUNTER — Ambulatory Visit: Payer: Medicare Other | Admitting: Radiation Oncology

## 2017-08-08 ENCOUNTER — Inpatient Hospital Stay
Admission: RE | Admit: 2017-08-08 | Payer: Medicare Other | Source: Ambulatory Visit | Attending: Radiation Oncology | Admitting: Radiation Oncology

## 2017-08-12 ENCOUNTER — Telehealth: Payer: Self-pay | Admitting: *Deleted

## 2017-08-12 NOTE — Telephone Encounter (Signed)
Called patient to inform of fu with Alisha Fisher on 08-14-17 @ 3:30 pm, lvm for a return call

## 2017-08-14 ENCOUNTER — Other Ambulatory Visit: Payer: Self-pay

## 2017-08-14 ENCOUNTER — Telehealth: Payer: Self-pay | Admitting: *Deleted

## 2017-08-14 ENCOUNTER — Inpatient Hospital Stay (HOSPITAL_COMMUNITY)
Admission: EM | Admit: 2017-08-14 | Discharge: 2017-08-18 | DRG: 190 | Disposition: A | Discharge status: Home / Self Care | Payer: Medicare Other | Attending: Internal Medicine | Admitting: Internal Medicine

## 2017-08-14 ENCOUNTER — Other Ambulatory Visit: Payer: Self-pay | Admitting: Radiation Oncology

## 2017-08-14 ENCOUNTER — Emergency Department (HOSPITAL_COMMUNITY): Payer: Medicare Other

## 2017-08-14 ENCOUNTER — Ambulatory Visit
Admission: RE | Admit: 2017-08-14 | Discharge: 2017-08-14 | Disposition: A | Discharge status: Home / Self Care | Payer: Medicare Other | Source: Ambulatory Visit | Attending: Radiation Oncology | Admitting: Radiation Oncology

## 2017-08-14 ENCOUNTER — Encounter (HOSPITAL_COMMUNITY): Payer: Self-pay

## 2017-08-14 ENCOUNTER — Encounter: Payer: Self-pay | Admitting: Radiation Oncology

## 2017-08-14 VITALS — BP 164/85 | HR 98 | Temp 98.4°F | Resp 20

## 2017-08-14 DIAGNOSIS — J479 Bronchiectasis, uncomplicated: Principal | ICD-10-CM | POA: Diagnosis present

## 2017-08-14 DIAGNOSIS — Z6836 Body mass index (BMI) 36.0-36.9, adult: Secondary | ICD-10-CM

## 2017-08-14 DIAGNOSIS — K5900 Constipation, unspecified: Secondary | ICD-10-CM

## 2017-08-14 DIAGNOSIS — C3412 Malignant neoplasm of upper lobe, left bronchus or lung: Secondary | ICD-10-CM

## 2017-08-14 DIAGNOSIS — J9621 Acute and chronic respiratory failure with hypoxia: Secondary | ICD-10-CM | POA: Diagnosis present

## 2017-08-14 DIAGNOSIS — R0602 Shortness of breath: Secondary | ICD-10-CM | POA: Diagnosis not present

## 2017-08-14 DIAGNOSIS — D630 Anemia in neoplastic disease: Secondary | ICD-10-CM | POA: Diagnosis present

## 2017-08-14 DIAGNOSIS — I110 Hypertensive heart disease with heart failure: Secondary | ICD-10-CM | POA: Diagnosis present

## 2017-08-14 DIAGNOSIS — C3491 Malignant neoplasm of unspecified part of right bronchus or lung: Secondary | ICD-10-CM | POA: Diagnosis present

## 2017-08-14 DIAGNOSIS — J9622 Acute and chronic respiratory failure with hypercapnia: Secondary | ICD-10-CM | POA: Diagnosis present

## 2017-08-14 DIAGNOSIS — Y842 Radiological procedure and radiotherapy as the cause of abnormal reaction of the patient, or of later complication, without mention of misadventure at the time of the procedure: Secondary | ICD-10-CM | POA: Diagnosis present

## 2017-08-14 DIAGNOSIS — J441 Chronic obstructive pulmonary disease with (acute) exacerbation: Secondary | ICD-10-CM

## 2017-08-14 DIAGNOSIS — I5032 Chronic diastolic (congestive) heart failure: Secondary | ICD-10-CM | POA: Diagnosis present

## 2017-08-14 DIAGNOSIS — Z87891 Personal history of nicotine dependence: Secondary | ICD-10-CM

## 2017-08-14 DIAGNOSIS — J9611 Chronic respiratory failure with hypoxia: Secondary | ICD-10-CM | POA: Diagnosis present

## 2017-08-14 DIAGNOSIS — K219 Gastro-esophageal reflux disease without esophagitis: Secondary | ICD-10-CM | POA: Diagnosis present

## 2017-08-14 DIAGNOSIS — I10 Essential (primary) hypertension: Secondary | ICD-10-CM | POA: Diagnosis present

## 2017-08-14 DIAGNOSIS — Z923 Personal history of irradiation: Secondary | ICD-10-CM

## 2017-08-14 DIAGNOSIS — Z23 Encounter for immunization: Secondary | ICD-10-CM

## 2017-08-14 DIAGNOSIS — C3431 Malignant neoplasm of lower lobe, right bronchus or lung: Secondary | ICD-10-CM | POA: Diagnosis present

## 2017-08-14 DIAGNOSIS — Z79899 Other long term (current) drug therapy: Secondary | ICD-10-CM

## 2017-08-14 DIAGNOSIS — J700 Acute pulmonary manifestations due to radiation: Secondary | ICD-10-CM | POA: Diagnosis present

## 2017-08-14 DIAGNOSIS — F419 Anxiety disorder, unspecified: Secondary | ICD-10-CM | POA: Diagnosis present

## 2017-08-14 HISTORY — DX: Malignant (primary) neoplasm, unspecified: C80.1

## 2017-08-14 LAB — COMPREHENSIVE METABOLIC PANEL
ALK PHOS: 87 U/L (ref 38–126)
ALT: 10 U/L — ABNORMAL LOW (ref 14–54)
ANION GAP: 8 (ref 5–15)
AST: 14 U/L — ABNORMAL LOW (ref 15–41)
Albumin: 3.3 g/dL — ABNORMAL LOW (ref 3.5–5.0)
BUN: 19 mg/dL (ref 6–20)
CALCIUM: 8.8 mg/dL — AB (ref 8.9–10.3)
CHLORIDE: 95 mmol/L — AB (ref 101–111)
CO2: 33 mmol/L — AB (ref 22–32)
Creatinine, Ser: 0.88 mg/dL (ref 0.44–1.00)
GFR calc non Af Amer: >60 mL/min (ref >60)
Glucose, Bld: 131 mg/dL — ABNORMAL HIGH (ref 65–99)
Potassium: 3.8 mmol/L (ref 3.5–5.1)
Sodium: 136 mmol/L (ref 135–145)
Total Bilirubin: 0.7 mg/dL (ref 0.3–1.2)
Total Protein: 7 g/dL (ref 6.5–8.1)

## 2017-08-14 LAB — CBC WITH DIFFERENTIAL/PLATELET
Basophils Absolute: 0 10*3/uL (ref 0.0–0.1)
Basophils Relative: 0 %
EOS ABS: 0.6 10*3/uL (ref 0.0–0.7)
EOS PCT: 4 %
HCT: 33.8 % — ABNORMAL LOW (ref 36.0–46.0)
Hemoglobin: 10.8 g/dL — ABNORMAL LOW (ref 12.0–15.0)
LYMPHS ABS: 0.8 10*3/uL (ref 0.7–4.0)
LYMPHS PCT: 6 %
MCH: 29.3 pg (ref 26.0–34.0)
MCHC: 32 g/dL (ref 30.0–36.0)
MCV: 91.6 fL (ref 78.0–100.0)
MONO ABS: 0.5 10*3/uL (ref 0.1–1.0)
MONOS PCT: 4 %
Neutro Abs: 11.2 10*3/uL — ABNORMAL HIGH (ref 1.7–7.7)
Neutrophils Relative %: 86 %
PLATELETS: 266 10*3/uL (ref 150–400)
RBC: 3.69 MIL/uL — AB (ref 3.87–5.11)
RDW: 13.9 % (ref 11.5–15.5)
WBC: 13.1 10*3/uL — ABNORMAL HIGH (ref 4.0–10.5)

## 2017-08-14 LAB — BRAIN NATRIURETIC PEPTIDE: B NATRIURETIC PEPTIDE 5: 146 pg/mL — AB (ref 0.0–100.0)

## 2017-08-14 LAB — I-STAT TROPONIN, ED: TROPONIN I, POC: 0.01 ng/mL (ref 0.00–0.08)

## 2017-08-14 MED ORDER — ALBUTEROL SULFATE (2.5 MG/3ML) 0.083% IN NEBU
5.0000 mg | INHALATION_SOLUTION | Freq: Once | RESPIRATORY_TRACT | Status: AC
  Administered 2017-08-14: 5 mg via RESPIRATORY_TRACT
  Filled 2017-08-14: qty 6

## 2017-08-14 MED ORDER — IPRATROPIUM BROMIDE 0.02 % IN SOLN
0.5000 mg | Freq: Once | RESPIRATORY_TRACT | Status: AC
  Administered 2017-08-14: 0.5 mg via RESPIRATORY_TRACT
  Filled 2017-08-14: qty 2.5

## 2017-08-14 MED ORDER — LORAZEPAM 0.5 MG PO TABS
0.5000 mg | ORAL_TABLET | Freq: Once | ORAL | Status: AC
  Administered 2017-08-14: 0.5 mg via ORAL
  Filled 2017-08-14: qty 1

## 2017-08-14 MED ORDER — PREDNISONE 50 MG PO TABS
50.0000 mg | ORAL_TABLET | Freq: Once | ORAL | Status: AC
  Administered 2017-08-14: 50 mg via ORAL
  Filled 2017-08-14: qty 1

## 2017-08-14 NOTE — Telephone Encounter (Signed)
Called patient to ask about coming earlier today, spoke with patient's sister- [name] and they will be here @ 2 pm today.

## 2017-08-14 NOTE — Progress Notes (Addendum)
Alisha Fisher 71 y.o. woman with malignant neoplasm of middle lobe, bronchus or lung,Malignant neoplasm of upper lobe, left bronchus or lung completed radiation 06-26-17,review 08-07-17 CT chest w contrast,FU.   Weight changes, if any: Patient states no change. Did not want to get weighted today. Respiratory complaints, if URK:YHCWCBJ was  sob oxygen was 71% on room air when she arrived today .She was put on 3 liters and her oxygen went up to 96%. Hemoptysis, if any: Denies any Hemoptysis   Swallowing Problems/Pain/Difficulty swallowing: Sometime  If she eats to fast Appetite :ok When is next chemo scheduled?:No Imaging:08-07-00 CT chest w contrast Lab work from of chart:N/A Patients blood pressure was elevated today denied any headaches,chestpains or numbness in extremities.Patient states that she is going to her medical doctor today Vitals:   08/14/17 1412 08/14/17 1418 08/14/17 1436  BP:  (!) 160/71 (!) 164/85  Pulse:  96 98  Resp:  20 20  Temp:  98.4 F (36.9 C)   TempSrc:  Oral   SpO2: (!) 71% 95% 96%   Wt Readings from Last 3 Encounters:  05/29/17 238 lb 12.8 oz (108.3 kg)  03/26/17 236 lb (107 kg)  03/20/17 236 lb (107 kg)

## 2017-08-14 NOTE — Progress Notes (Addendum)
Radiation Oncology         [phone] ________________________________  Name: Alisha Fisher MRN: [id number]  Date of Service: 08/14/2017 DOB: 1945-12-20  Post Treatment Note  CC: [name], MD  Volanda Napoleon, MD  Diagnosis:   Stage IV NSCLC, squamous cell carcinoma of the right lung  Interval Since Last Radiation:  7 weeks   06/11/2017-06/26/2017?SBRT: Left Upper Lobe?Lung/ 50 Gy in 5 fractions    04/11/17-04/22/17:  24 Gy in 8 fractions to the right lung/mediastinum   12/21/16-01/03/17:  30 Gy in 10 fractions to the right lung/mediastinum     Narrative: ?The patient returns today for routine follow-up. In summary this is a patient with Stage IV NSCLC who underwent palliative radiotherapy during hospitalization earlier this year. She did well in tolerating this and the decision was made to proceed with completing a more conventional type of treatment as she did well with palliative treatment. With the hopes of helping with local control, she also received SBRT to the LUL. She comes today for a 1 month follow up since SBRT, and underwent a CT scan without contrast yesterday. This revealed improvement in the left upper lobe lesion which now measures 11 x 11 mm (previously 13 x 19 mm). There is radiation change in the right lung/perihilar region, and a 12 mm low right paratracheal node and smaller prevascular, paratracheal, and subcarinal nodes. She has not been back to see Dr. Marin Olp to discuss options of systemic therapy since June.?               On review of systems, the patient states she is short of breath with activity.  She continues to wear 2-3 L of oxygen continuously at home but is hoping to get a new compressor.  She is anxious to find out from her primary care provider if she can change this to a different supplier.  She is seeing them today to evaluate other conditions including her blood pressure, today is in the 967E systolically.  She denies any concerns with skin  change since radiotherapy, and is not having any fevers, productive mucus, or increased cough.  She denies any new shortness of breath but states that without oxygen she becomes hypoxic and short of breath.  No other complaints are verbalized  ALLERGIES:  is allergic to iohexol; ciprofloxacin; flagyl [metronidazole]; and zosyn [piperacillin sod-tazobactam so].  Meds: No current facility-administered medications for this encounter.    No current outpatient medications on file.   Facility-Administered Medications Ordered in Other Encounters  Medication Dose Route Frequency Provider Last Rate Last Dose  . 0.9 %  sodium chloride infusion  250 mL Intravenous PRN Derrill Kay A, MD      . albuterol (PROVENTIL) (2.5 MG/3ML) 0.083% nebulizer solution 2.5 mg  2.5 mg Nebulization Q6H Derrill Kay A, MD   2.5 mg at 08/15/17 0916  . albuterol (PROVENTIL) (2.5 MG/3ML) 0.083% nebulizer solution 2.5 mg  2.5 mg Nebulization Q2H PRN Derrill Kay A, MD      . amLODipine (NORVASC) tablet 10 mg  10 mg Oral Daily Phillips Grout, MD   10 mg at 08/15/17 0946  . [START ON 08/16/2017] azithromycin (ZITHROMAX) tablet 250 mg  250 mg Oral Daily Derrill Kay A, MD      . budesonide (PULMICORT) nebulizer solution 0.5 mg  0.5 mg Nebulization BID Barton Dubois, MD   0.5 mg at 08/15/17 9381  . guaiFENesin (MUCINEX) 12 hr tablet 600 mg  600 mg Oral BID  Phillips Grout, MD   600 mg at 08/15/17 2595  . hydrOXYzine (ATARAX/VISTARIL) tablet 25 mg  25 mg Oral TID PRN Barton Dubois, MD      . Influenza vac split quadrivalent PF (FLUZONE HIGH-DOSE) injection 0.5 mL  0.5 mL Intramuscular Once Barton Dubois, MD      . ipratropium (ATROVENT) nebulizer solution 0.5 mg  0.5 mg Nebulization Q6H Derrill Kay A, MD   0.5 mg at 08/15/17 0915  . loratadine (CLARITIN) tablet 10 mg  10 mg Oral Daily Barton Dubois, MD      . losartan (COZAAR) tablet 100 mg  100 mg Oral Daily Barton Dubois, MD      . MEDLINE mouth rinse  15 mL Mouth  Rinse BID Derrill Kay A, MD   15 mL at 08/15/17 1000  . methylPREDNISolone sodium succinate (SOLU-MEDROL) 40 mg/mL injection 40 mg  40 mg Intravenous Q8H Barton Dubois, MD   40 mg at 08/15/17 6387  . metoprolol succinate (TOPROL-XL) 24 hr tablet 25 mg  25 mg Oral Daily Barton Dubois, MD      . sodium chloride flush (NS) 0.9 % injection 3 mL  3 mL Intravenous Q12H David, Rachal A, MD      . sodium chloride flush (NS) 0.9 % injection 3 mL  3 mL Intravenous PRN Phillips Grout, MD        Physical Findings:  oral temperature is 98.4 F (36.9 C). Her blood pressure is 164/85 (abnormal) and her pulse is 98. Her respiration is 20 and oxygen saturation is 96%.  Pain Assessment Pain Score: 0-No pain/10 In general this is a well appearing Caucasian female in no acute distress.  She's alert and oriented x4 and appropriate throughout the examination. Cardiopulmonary assessment is negative for acute distress and she exhibits normal effort.  Coarse breath sounds are noted throughout the lungs without consolidation, or decreased sounds on either side.  Lab Findings: Lab Results  Component Value Date   WBC 13.1 (H) 08/14/2017   HGB 10.8 (L) 08/14/2017   HCT 33.8 (L) 08/14/2017   MCV 91.6 08/14/2017   PLT 266 08/14/2017     Radiographic Findings: Dg Chest 2 View  Result Date: 08/14/2017 CLINICAL DATA:  71 year old female with shortness of breath. EXAM: CHEST  2 VIEW COMPARISON:  Chest CT dated 08/07/2017 FINDINGS: An area of opacity in the right mid lung field and right hilar region corresponds to the radiation changes seen on the prior CT. Left upper lobe streaky densities also seen on the prior CT. The previously seen nodule/ neoplasm in the left upper lobe is not well visualized on this radiograph. No new consolidative changes. There is no pleural effusion or pneumothorax. There is right pleural thickening. There is cardiomegaly with coronary vascular calcification. There is atherosclerotic  calcification of the aortic arch. No acute osseous pathology. IMPRESSION: 1. No acute cardiopulmonary process. 2. 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 3. Cardiomegaly. Electronically Signed   By: Anner Crete M.D.   On: 08/14/2017 21:19   Ct Chest Wo Contrast  Result Date: 08/07/2017 CLINICAL DATA:  Non-small cell lung cancer of the left upper lobe, status post radiation EXAM: CT CHEST WITHOUT CONTRAST TECHNIQUE: Multidetector CT imaging of the chest was performed following the standard protocol without IV contrast. COMPARISON:  PET-CT dated 02/05/2017 FINDINGS: Cardiovascular: Heart is top-normal in size. No pericardial effusion. No evidence of thoracic aortic aneurysm. Atherosclerotic calcifications of the aortic arch. Mild three-vessel coronary  atherosclerosis. Mediastinum/Nodes: 12 mm short axis low right paratracheal node (series 2/ image 50). Additional smaller prevascular, paratracheal, and subcarinal nodes. Visualized thyroid is unremarkable. Lungs/Pleura: Radiation changes in the right lung/perihilar region. Right lower lobe predominant bronchiectasis. Volume loss in the right hemithorax. Additional patchy opacity in the anterior left upper lobe, likely reflecting radiation changes. Underlying 11 x 11 mm medial left upper lobe pulmonary nodule (series 7/image 50), previously 13 x 19 mm. Centrilobular and paraseptal emphysematous changes, upper lobe predominant. No focal consolidation. Trace right pleural fluid.  No pneumothorax. Upper Abdomen: Visualized upper abdomen is unremarkable. Musculoskeletal: Mild degenerative changes of the visualized thoracolumbar spine. IMPRESSION: 11 mm medial left upper lobe pulmonary nodule, corresponding to known primary bronchogenic neoplasm, decreased. Radiation changes in the left upper lobe and right lung. Mildly prominent mediastinal lymph nodes, including a dominant 12 mm short axis low right paratracheal node, indeterminate.  Aortic Atherosclerosis (ICD10-I70.0) and Emphysema (ICD10-J43.9). Electronically Signed   By: Julian Hy M.D.   On: 08/07/2017 17:15    Impression/Plan: 1. Stage IV NSCLC, squamous cell carcinoma of the right lung.  The patient is completed her course of radiotherapy, and is in need to get back with medical oncology.  It is unclear about the role for systemic therapy, I was under the impression that she would receive either chemo or radiotherapy, and the patient states that she was under the impression that she did not need this.  We will set her up to see Dr. Marin Olp to further discuss his plans for her management.  Otherwise we will see her as needed moving forward as she will be following with him closely.  But we would be happy to see her again to discuss if there is role for additional local therapy with radiation. 2. 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  The patient will continue to follow-up with pulmonology and her primary care provider, she became hypoxic walking down the hall without her oxygen on however this resolved and her O2 sats were in the high 90 percentile after 3 L of oxygen was placed via nasal cannula.  Pain encouraged her to continue to using her oxygen tank when she goes out even if she does not have the ideal compressor she would like to have. 3. Hypertension.  She was counseled on the importance of discussing this with her primary care provider and will be seen today for this.     Carola Rhine, PAC

## 2017-08-14 NOTE — ED Notes (Signed)
Pt ambulated to the bathroom on 2L and then on the way back to her room I turned it up to 3L and she dropped to 78%

## 2017-08-14 NOTE — ED Notes (Signed)
FINISHED RADIATION TX FEW MONTHS AGO. ANXIETY INCREASED OVER SEVERAL DAY. SAW PCP FOR ANXIETY. TEST LOOK GOOD FOR CANCER. PT STATES SHE DID NOT WEAR HER OXYGEN INTO ROOM HOWEVER DID HAVE IT WITH HER. PT DENIES CHEST PAIN THEN AND AT PRESENT

## 2017-08-14 NOTE — ED Provider Notes (Signed)
[facility] DEPT Provider Note   CSN: [id number] Arrival date & time: 08/14/17  1842     History   Chief Complaint Chief Complaint  Patient presents with  . Shortness of Breath    HPI Alyssandra Hulsebus is a 71 y.o. female.  HPI 71 year old female who presents with shortness of breath.  She has a history of COPD with chronic respiratory failure on 3 L home oxygen, stage IIIb squamous cell lung cancer of the right lung status post radiation therapy, and diastolic heart failure.  Reports that she had gone in to see her primary care doctor today to see if she could be prescribed medications for depression and anxiety.  States that she has had a lot of stress dealing with her current medical problems, and having increasing thoughts of anxiety.  She states that she walked into the office without her usual oxygen on, and on their evaluation she was 70% on room air.  They try to place her on oxygen and give her a breathing treatment and had difficulty bringing her oxygen back up to normal, so she was sent to the ED for evaluation. She states she felt very panicked during that time.  She has had mild cough not productive of sputum.  Denies any fevers or chills.  States that she has been short of breath for a long time, and may feel slightly worse recently.  No lower extremity edema or calf tenderness.  No chest pains.   Past Medical History:  Diagnosis Date  . Cancer ([place])   . COPD, severe ([place])   . HTN (hypertension)   . Pneumonia   . Pulmonary emphysema ([place])   . SOB (shortness of breath)     Patient Active Problem List   Diagnosis Date Noted  . Chronic diastolic heart failure ([place]) 12/30/2016  . Squamous cell lung cancer, right ([place]) 12/25/2016  . Rash and nonspecific skin eruption   . Malignant neoplasm of bronchus of left upper lobe ([place]) 12/20/2016  . Acute on chronic respiratory failure with hypoxia and hypercapnia ([place]) 12/18/2016  . Essential  hypertension 12/18/2016  . Anemia 12/18/2016  . Hypokalemia 12/16/2016  . Hypomagnesemia 12/16/2016  . Intraperitoneal abscess s/p perc drainage 12/14/2016 12/15/2016  . Acute appendicitis with perforation and peritoneal abscess 12/03/2016  . Obesity 05/25/2012  . Smoker 05/25/2012  . COPD without exacerbation ([place]) 05/25/2012    Past Surgical History:  Procedure Laterality Date  . CATARACT EXTRACTION    . IR GENERIC HISTORICAL  12/21/2016   IR SINUS/FIST TUBE CHK-NON GI 12/21/2016 [facility]  . IR GENERIC HISTORICAL  12/21/2016   IR US GUIDE BX ASP/DRAIN 12/21/2016 Corrie Mckusick, DO [facility]  . IR RADIOLOGIST EVAL & MGMT  01/17/2017    OB History    No data available       Home Medications    Prior to Admission medications   Medication Sig Start Date End Date Taking? Authorizing Provider  albuterol (PROVENTIL HFA;VENTOLIN HFA) 108 (90 Base) MCG/ACT inhaler Inhale 2 puffs into the lungs every 6 (six) hours as needed for wheezing or shortness of breath. 06/20/17  Yes Javier Glazier, MD  amLODipine (NORVASC) 10 MG tablet Take 1 tablet (10 mg total) daily by mouth. 08/06/17  Yes Belva Crome, MD  Cholecalciferol (VITAMIN D PO) Take 5,000 Units by mouth daily.    Yes [provider]  diphenhydrAMINE (BENADRYL) 25 mg capsule Take 25 mg by mouth every 4 (four) hours as needed  for itching or allergies.   Yes [provider]  losartan-hydrochlorothiazide (HYZAAR) 100-25 MG tablet Take 1 tablet daily by mouth. 08/06/17  Yes Belva Crome, MD  metoprolol succinate (TOPROL-XL) 100 MG 24 hr tablet Take 1.5 tablets (150 mg) daily with or immediately following a meal. 08/06/17  Yes Belva Crome, MD  umeclidinium-vilanterol (ANORO ELLIPTA) 62.5-25 MCG/INH AEPB Inhale 1 puff into the lungs daily. 06/05/17  Yes Javier Glazier, MD  ADVAIR DISKUS 250-50 MCG/DOSE AEPB Inhale 1 puff into the lungs daily. 02/16/17   [provider]  escitalopram (LEXAPRO) 10 MG  tablet Take 10 mg by mouth daily.    [provider]  hydrOXYzine (ATARAX/VISTARIL) 25 MG tablet Take 25 mg by mouth 3 (three) times daily as needed for anxiety.    [provider]  loratadine (CLARITIN) 10 MG tablet Take 1 tablet (10 mg total) by mouth daily. Patient taking differently: Take 10 mg daily as needed by mouth for allergies.  01/07/17   Magdalen Spatz, NP  potassium chloride SA (K-DUR,KLOR-CON) 20 MEQ tablet Take 1 tablet (20 mEq total) by mouth daily. Patient not taking: Reported on 05/29/2017 01/01/17   Arrien, Jimmy Picket, MD  Spacer/Aero-Holding Josiah Lobo (AEROCHAMBER MV) inhaler Use as instructed Patient not taking: Reported on 08/14/2017 02/21/17   Magdalen Spatz, NP    Family History Family History  Problem Relation Age of Onset  . Allergies Unknown   . COPD Cousin   . Breast cancer Mother   . Diabetes Father   . Diabetes Son     Social History Social History   Tobacco Use  . Smoking status: Former Smoker    Packs/day: 0.50    Years: 50.00    Pack years: 25.00    Types: Cigarettes    Last attempt to quit: 11/01/2016    Years since quitting: 0.7  . Smokeless tobacco: Never Used  Substance Use Topics  . Alcohol use: No  . Drug use: No     Allergies   Iohexol; Ciprofloxacin; Flagyl [metronidazole]; and Zosyn [piperacillin sod-tazobactam so]   Review of Systems Review of Systems  Constitutional: Negative for fever.  Respiratory: Positive for cough and shortness of breath.   Cardiovascular: Negative for chest pain.  All other systems reviewed and are negative.    Physical Exam Updated Vital Signs BP (!) 112/54   Pulse 96   Temp 98 F (36.7 C) (Oral)   Resp (!) 21   Ht 5\' 7"  (1.702 m)   Wt 104.3 kg (230 lb)   SpO2 (!) 89%   BMI 36.02 kg/m   Physical Exam Physical Exam  Nursing note and vitals reviewed. Constitutional: Well developed, well nourished, non-toxic, and in no acute distress Head: Normocephalic and atraumatic.    Mouth/Throat: Oropharynx is clear and moist.  Neck: Normal range of motion. Neck supple.  Cardiovascular: Normal rate and regular rhythm.   Pulmonary/Chest: Effort normal and expiratory wheezing throughout Abdominal: Soft. There is no tenderness. There is no rebound and no guarding.  Musculoskeletal: Normal range of motion. No edema Neurological: Alert, no facial droop, fluent speech, moves all extremities symmetrically Skin: Skin is warm and dry.  Psychiatric: Cooperative   ED Treatments / Results  Labs (all labs ordered are listed, but only abnormal results are displayed) Labs Reviewed  CBC WITH DIFFERENTIAL/PLATELET - Abnormal; Notable for the following components:      Result Value   WBC 13.1 (*)    RBC 3.69 (*)  Hemoglobin 10.8 (*)    HCT 33.8 (*)    Neutro Abs 11.2 (*)    All other components within normal limits  COMPREHENSIVE METABOLIC PANEL - Abnormal; Notable for the following components:   Chloride 95 (*)    CO2 33 (*)    Glucose, Bld 131 (*)    Calcium 8.8 (*)    Albumin 3.3 (*)    AST 14 (*)    ALT 10 (*)    All other components within normal limits  BRAIN NATRIURETIC PEPTIDE - Abnormal; Notable for the following components:   B Natriuretic Peptide 146.0 (*)    All other components within normal limits  I-STAT TROPONIN, ED    EKG  EKG Interpretation None       Radiology Dg Chest 2 View  Result Date: 08/14/2017 CLINICAL DATA:  71 year old female with shortness of breath. EXAM: CHEST  2 VIEW COMPARISON:  Chest CT dated 08/07/2017 FINDINGS: An area of opacity in the right mid lung field and right hilar region corresponds to the radiation changes seen on the prior CT. Left upper lobe streaky densities also seen on the prior CT. The previously seen nodule/ neoplasm in the left upper lobe is not well visualized on this radiograph. No new consolidative changes. There is no pleural effusion or pneumothorax. There is right pleural thickening. There is  cardiomegaly with coronary vascular calcification. There is atherosclerotic calcification of the aortic arch. No acute osseous pathology. IMPRESSION: 1. No acute cardiopulmonary process. 2. 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 3. Cardiomegaly. Electronically Signed   By: Anner Crete M.D.   On: 08/14/2017 21:19    Procedures Procedures (including critical care time)  Medications Ordered in ED Medications  albuterol (PROVENTIL) (2.5 MG/3ML) 0.083% nebulizer solution 5 mg (not administered)  albuterol (PROVENTIL) (2.5 MG/3ML) 0.083% nebulizer solution 5 mg (5 mg Nebulization Given 08/14/17 2012)  ipratropium (ATROVENT) nebulizer solution 0.5 mg (0.5 mg Nebulization Given 08/14/17 2012)  LORazepam (ATIVAN) tablet 0.5 mg (0.5 mg Oral Given 08/14/17 2012)  predniSONE (DELTASONE) tablet 50 mg (50 mg Oral Given 08/14/17 2132)  albuterol (PROVENTIL) (2.5 MG/3ML) 0.083% nebulizer solution 5 mg (5 mg Nebulization Given 08/14/17 2133)  ipratropium (ATROVENT) nebulizer solution 0.5 mg (0.5 mg Nebulization Given 08/14/17 2133)     Initial Impression / Assessment and Plan / ED Course  I have reviewed the triage vital signs and the nursing notes.  Pertinent labs & imaging results that were available during my care of the patient were reviewed by me and considered in my medical decision making (see chart for details).     71 year old female who presents with shortness of breath.  With oxygen here, at rest her pulse ox is 88-92% on room air, which I feel is normal for her due to her chronic COPD and lung cancer.  She does have significant wheezing on exam, but is in no respiratory distress.  Treated for COPD exacerbation with prednisone and breathing treatments.  Feels symptomatically improved, but with ambulation has hypoxia to 78% while wearing her 2 L of oxygen.  X-ray visualized and shows no acute cardiopulmonary processes.  Blood work otherwise reassuring.   Given her hypoxia plan to admit for ongoing COPD treatment. Discussed with Dr. Shanon Brow.  Final Clinical Impressions(s) / ED Diagnoses   Final diagnoses:  Acute exacerbation of chronic obstructive pulmonary disease (COPD) [facility])    ED Discharge Orders    None       Atisha Hamidi,  Eugenia Mcalpine, MD 08/14/17 [phone]

## 2017-08-14 NOTE — ED Triage Notes (Signed)
Per [facility]- Pt resides at home. Seen at PCP for anxiety. 02 72%. Breathing TX performed in office albuterol given 5mg  . 02 85% 5LNC. EMS called for transport. Pt denies any recent illness. Hx of lung Cancer Denies chest pain

## 2017-08-14 NOTE — ED Notes (Signed)
Bed: RC38 Expected date:  Expected time:  Means of arrival:  Comments: EMS/foot injury/hyperglycemia

## 2017-08-14 NOTE — Telephone Encounter (Addendum)
Spoke to patient's sister. They have an appointment today with Dr Lisbeth Renshaw to go over results. Results below were not given as we were not the ordering provider. Patient will go to this afternoons appointment to get results.   ----- Message from Volanda Napoleon, MD sent at 08/13/2017  3:42 PM EST ----- Please call her and tell her that the lung cancer is smaller.  She has some radiation changes in the lung but we do not see anything that looks like cancer that is growing.  Happy Thanksgiving!!!  pete

## 2017-08-14 NOTE — ED Notes (Signed)
BLOOD CULTURE X 1 5ML EACH RT FOREARM

## 2017-08-15 ENCOUNTER — Encounter (HOSPITAL_COMMUNITY): Payer: Self-pay | Admitting: Emergency Medicine

## 2017-08-15 ENCOUNTER — Other Ambulatory Visit: Payer: Self-pay

## 2017-08-15 DIAGNOSIS — K219 Gastro-esophageal reflux disease without esophagitis: Secondary | ICD-10-CM | POA: Diagnosis present

## 2017-08-15 DIAGNOSIS — D630 Anemia in neoplastic disease: Secondary | ICD-10-CM | POA: Diagnosis present

## 2017-08-15 DIAGNOSIS — Z6836 Body mass index (BMI) 36.0-36.9, adult: Secondary | ICD-10-CM | POA: Diagnosis not present

## 2017-08-15 DIAGNOSIS — I10 Essential (primary) hypertension: Secondary | ICD-10-CM | POA: Diagnosis not present

## 2017-08-15 DIAGNOSIS — R0602 Shortness of breath: Secondary | ICD-10-CM | POA: Diagnosis present

## 2017-08-15 DIAGNOSIS — K5900 Constipation, unspecified: Secondary | ICD-10-CM | POA: Diagnosis present

## 2017-08-15 DIAGNOSIS — C3491 Malignant neoplasm of unspecified part of right bronchus or lung: Secondary | ICD-10-CM | POA: Diagnosis present

## 2017-08-15 DIAGNOSIS — J9621 Acute and chronic respiratory failure with hypoxia: Secondary | ICD-10-CM | POA: Diagnosis not present

## 2017-08-15 DIAGNOSIS — C3412 Malignant neoplasm of upper lobe, left bronchus or lung: Secondary | ICD-10-CM | POA: Diagnosis present

## 2017-08-15 DIAGNOSIS — I5032 Chronic diastolic (congestive) heart failure: Secondary | ICD-10-CM | POA: Diagnosis not present

## 2017-08-15 DIAGNOSIS — F419 Anxiety disorder, unspecified: Secondary | ICD-10-CM | POA: Diagnosis present

## 2017-08-15 DIAGNOSIS — Z79899 Other long term (current) drug therapy: Secondary | ICD-10-CM | POA: Diagnosis not present

## 2017-08-15 DIAGNOSIS — I110 Hypertensive heart disease with heart failure: Secondary | ICD-10-CM | POA: Diagnosis present

## 2017-08-15 DIAGNOSIS — Z923 Personal history of irradiation: Secondary | ICD-10-CM | POA: Diagnosis not present

## 2017-08-15 DIAGNOSIS — Y842 Radiological procedure and radiotherapy as the cause of abnormal reaction of the patient, or of later complication, without mention of misadventure at the time of the procedure: Secondary | ICD-10-CM | POA: Diagnosis not present

## 2017-08-15 DIAGNOSIS — J479 Bronchiectasis, uncomplicated: Secondary | ICD-10-CM | POA: Diagnosis present

## 2017-08-15 DIAGNOSIS — Z87891 Personal history of nicotine dependence: Secondary | ICD-10-CM | POA: Diagnosis not present

## 2017-08-15 DIAGNOSIS — J700 Acute pulmonary manifestations due to radiation: Secondary | ICD-10-CM | POA: Diagnosis present

## 2017-08-15 DIAGNOSIS — C3490 Malignant neoplasm of unspecified part of unspecified bronchus or lung: Secondary | ICD-10-CM | POA: Diagnosis not present

## 2017-08-15 DIAGNOSIS — J9622 Acute and chronic respiratory failure with hypercapnia: Secondary | ICD-10-CM | POA: Diagnosis present

## 2017-08-15 DIAGNOSIS — Z23 Encounter for immunization: Secondary | ICD-10-CM | POA: Diagnosis present

## 2017-08-15 DIAGNOSIS — J441 Chronic obstructive pulmonary disease with (acute) exacerbation: Secondary | ICD-10-CM

## 2017-08-15 LAB — IRON AND TIBC
Iron: 45 ug/dL (ref 28–170)
Saturation Ratios: 16 % (ref 10.4–31.8)
TIBC: 276 ug/dL (ref 250–450)
UIBC: 231 ug/dL

## 2017-08-15 LAB — FERRITIN: FERRITIN: 186 ng/mL (ref 11–307)

## 2017-08-15 MED ORDER — SODIUM CHLORIDE 0.9% FLUSH: 3.0000 mL | INTRAVENOUS | Status: DC | PRN

## 2017-08-15 MED ORDER — GUAIFENESIN ER 600 MG PO TB12
600.0000 mg | ORAL_TABLET | Freq: Two times a day (BID) | ORAL | Status: DC
  Administered 2017-08-15 – 2017-08-16 (×5): 600 mg via ORAL
  Filled 2017-08-15 (×5): qty 1

## 2017-08-15 MED ORDER — LOSARTAN POTASSIUM 50 MG PO TABS
100.0000 mg | ORAL_TABLET | Freq: Every day | ORAL | Status: DC
  Administered 2017-08-15 – 2017-08-18 (×4): 100 mg via ORAL
  Filled 2017-08-15 (×4): qty 2

## 2017-08-15 MED ORDER — METHYLPREDNISOLONE SODIUM SUCC 40 MG IJ SOLR
40.0000 mg | Freq: Three times a day (TID) | INTRAMUSCULAR | Status: DC
  Administered 2017-08-15 – 2017-08-18 (×9): 40 mg via INTRAVENOUS
  Filled 2017-08-15 (×9): qty 1

## 2017-08-15 MED ORDER — INFLUENZA VAC SPLIT HIGH-DOSE 0.5 ML IM SUSY: 0.5000 mL | PREFILLED_SYRINGE | INTRAMUSCULAR | Status: DC

## 2017-08-15 MED ORDER — ALBUTEROL SULFATE (2.5 MG/3ML) 0.083% IN NEBU
2.5000 mg | INHALATION_SOLUTION | Freq: Four times a day (QID) | RESPIRATORY_TRACT | Status: DC
  Administered 2017-08-15 – 2017-08-16 (×5): 2.5 mg via RESPIRATORY_TRACT
  Filled 2017-08-15 (×6): qty 3

## 2017-08-15 MED ORDER — LORATADINE 10 MG PO TABS
10.0000 mg | ORAL_TABLET | Freq: Every day | ORAL | Status: DC
  Administered 2017-08-15 – 2017-08-18 (×4): 10 mg via ORAL
  Filled 2017-08-15 (×4): qty 1

## 2017-08-15 MED ORDER — ALBUTEROL SULFATE (2.5 MG/3ML) 0.083% IN NEBU
2.5000 mg | INHALATION_SOLUTION | RESPIRATORY_TRACT | Status: DC | PRN
  Filled 2017-08-15: qty 3

## 2017-08-15 MED ORDER — BUDESONIDE 0.5 MG/2ML IN SUSP
0.5000 mg | Freq: Two times a day (BID) | RESPIRATORY_TRACT | Status: DC
  Administered 2017-08-15 – 2017-08-18 (×6): 0.5 mg via RESPIRATORY_TRACT
  Filled 2017-08-15 (×7): qty 2

## 2017-08-15 MED ORDER — ORAL CARE MOUTH RINSE
15.0000 mL | Freq: Two times a day (BID) | OROMUCOSAL | Status: DC
  Administered 2017-08-15: 15 mL via OROMUCOSAL

## 2017-08-15 MED ORDER — METOPROLOL SUCCINATE ER 25 MG PO TB24
25.0000 mg | ORAL_TABLET | Freq: Every day | ORAL | Status: DC
  Administered 2017-08-15 – 2017-08-18 (×4): 25 mg via ORAL
  Filled 2017-08-15 (×4): qty 1

## 2017-08-15 MED ORDER — HYDROXYZINE HCL 25 MG PO TABS
25.0000 mg | ORAL_TABLET | Freq: Three times a day (TID) | ORAL | Status: DC | PRN
  Administered 2017-08-15 – 2017-08-16 (×2): 25 mg via ORAL
  Filled 2017-08-15 (×2): qty 1

## 2017-08-15 MED ORDER — PREDNISONE 5 MG PO TABS: 30.0000 mg | ORAL_TABLET | Freq: Two times a day (BID) | ORAL | Status: DC

## 2017-08-15 MED ORDER — AZITHROMYCIN 250 MG PO TABS
250.0000 mg | ORAL_TABLET | Freq: Every day | ORAL | Status: DC
  Administered 2017-08-16 – 2017-08-18 (×3): 250 mg via ORAL
  Filled 2017-08-15 (×3): qty 1

## 2017-08-15 MED ORDER — INFLUENZA VAC SPLIT HIGH-DOSE 0.5 ML IM SUSY
0.5000 mL | PREFILLED_SYRINGE | Freq: Once | INTRAMUSCULAR | Status: AC
  Administered 2017-08-15: 0.5 mL via INTRAMUSCULAR
  Filled 2017-08-15: qty 0.5

## 2017-08-15 MED ORDER — SODIUM CHLORIDE 0.9 % IV SOLN: 250.0000 mL | INTRAVENOUS | Status: DC | PRN

## 2017-08-15 MED ORDER — AMLODIPINE BESYLATE 10 MG PO TABS
10.0000 mg | ORAL_TABLET | Freq: Every day | ORAL | Status: DC
  Administered 2017-08-15 – 2017-08-18 (×4): 10 mg via ORAL
  Filled 2017-08-15 (×4): qty 1

## 2017-08-15 MED ORDER — SODIUM CHLORIDE 0.9% FLUSH
3.0000 mL | Freq: Two times a day (BID) | INTRAVENOUS | Status: DC
  Administered 2017-08-15: 3 mL via INTRAVENOUS
  Administered 2017-08-15: 10:00:00 via INTRAVENOUS
  Administered 2017-08-16 – 2017-08-18 (×5): 3 mL via INTRAVENOUS

## 2017-08-15 MED ORDER — IPRATROPIUM BROMIDE 0.02 % IN SOLN
0.5000 mg | Freq: Four times a day (QID) | RESPIRATORY_TRACT | Status: DC
  Administered 2017-08-15 – 2017-08-16 (×5): 0.5 mg via RESPIRATORY_TRACT
  Filled 2017-08-15 (×6): qty 2.5

## 2017-08-15 MED ORDER — AZITHROMYCIN 250 MG PO TABS
500.0000 mg | ORAL_TABLET | Freq: Every day | ORAL | Status: AC
  Administered 2017-08-15: 500 mg via ORAL
  Filled 2017-08-15: qty 2

## 2017-08-15 NOTE — Care Management Obs Status (Signed)
[place] NOTIFICATION   Patient Details  Name: Alisha Fisher MRN: [id number] Date of Birth: 05/26/1946   Medicare Observation Status Notification Given:  Yes    Lynnell Catalan, RN 08/15/2017, 1:15 PM

## 2017-08-15 NOTE — Progress Notes (Signed)
Patient seen and examined. Admitted after midnight secondary to acute on chronic resp failure and hypoxia. Appears to be secondary to COPD Exacerbation and most likely underlying radiation pneumonitis. Patient is hemodynamically stable, but SOB, having difficulty speaking in full sentences and with fair air movement. Will use IV steroids, Duoneb, start pulmicort and continue antibiotics. Will follow oxygen needs and adjust oxygen supplementation. Resume home meds for BP control and also for anxiety. Please refer to H&P written by Dr. Shanon Brow for further info/details on admission.  Barton Dubois MD [phone]

## 2017-08-15 NOTE — Progress Notes (Signed)
Called to room by nurse stating patient was requesting a breathing treatment and was having difficulty breathing. Entered room to find patient with handheld fan. Asked patient if she was having trouble breathing, patient states she's feeling better now. Asked patient if she still wanted her PRN breathing treatment and patient stated No, not now and she feels like she is better. Verified with patient multiple times and explained it was no trouble to give her a breathing treatment if she needed one and patient still refused saying she felt better. Patient did question why she was not receiving her Anoro Ellipta inhaler like she has at home. Note placed for MD at this time. RT will continue to monitor patient's respiratory status. Patient in no visible distress.

## 2017-08-15 NOTE — H&P (Signed)
History and Physical    Alisha Fisher WIO:[id number] DOB: 05/31/1946 DOA: 08/14/2017  PCP: [name], MD  Patient coming from:  home  Chief Complaint: sob  HPI: Alisha Fisher is a 71 y.o. female with medical history significant of lung cancer, copd, anxiety comes in with sob and cough for several days.  Pt is on 3 liters [place] at home already.  She was given nebs and prednisone and has improved but when she ambulates her sats go down to 78% on 2 liters.  Denies fevers.  No chest pain.  No le edema or swelling.  Referred for admission for copde.  Review of Systems: As per HPI otherwise 10 point review of systems negative.   Past Medical History:  Diagnosis Date  . Cancer ([place])   . COPD, severe ([place])   . HTN (hypertension)   . Pneumonia   . Pulmonary emphysema ([place])   . SOB (shortness of breath)     Past Surgical History:  Procedure Laterality Date  . CATARACT EXTRACTION    . IR GENERIC HISTORICAL  12/21/2016   IR SINUS/FIST TUBE CHK-NON GI 12/21/2016 [facility]  . IR GENERIC HISTORICAL  12/21/2016   IR US GUIDE BX ASP/DRAIN 12/21/2016 Corrie Mckusick, DO [facility]  . IR RADIOLOGIST EVAL & MGMT  01/17/2017     reports that she quit smoking about 9 months ago. Her smoking use included cigarettes. She has a 25.00 pack-year smoking history. she has never used smokeless tobacco. She reports that she does not drink alcohol or use drugs.  Allergies  Allergen Reactions  . Iohexol Rash    Pt has had two previous ct scans with a reaction: hives.  . Ciprofloxacin Rash    Rash possibly caused by Cipro?  . Flagyl [Metronidazole] Rash    Rash possibly caused by Flagyl?  Marland Kitchen Zosyn [Piperacillin Sod-Tazobactam So] Itching and Rash    Has patient had a PCN reaction causing immediate rash, facial/tongue/throat swelling, SOB or lightheadedness with hypotension: No Has patient had a PCN reaction causing severe rash involving mucus membranes or skin necrosis: No Has patient had a PCN reaction  that required hospitalization: No Has patient had a PCN reaction occurring within the last 10 years: Yes If all of the above answers are "NO", then may proceed with Cephalosporin use.     Family History  Problem Relation Age of Onset  . Allergies Unknown   . COPD Cousin   . Breast cancer Mother   . Diabetes Father   . Diabetes Son     Prior to Admission medications   Medication Sig Start Date End Date Taking? Authorizing Provider  albuterol (PROVENTIL HFA;VENTOLIN HFA) 108 (90 Base) MCG/ACT inhaler Inhale 2 puffs into the lungs every 6 (six) hours as needed for wheezing or shortness of breath. 06/20/17  Yes Javier Glazier, MD  amLODipine (NORVASC) 10 MG tablet Take 1 tablet (10 mg total) daily by mouth. 08/06/17  Yes Belva Crome, MD  Cholecalciferol (VITAMIN D PO) Take 5,000 Units by mouth daily.    Yes [provider]  diphenhydrAMINE (BENADRYL) 25 mg capsule Take 25 mg by mouth every 4 (four) hours as needed for itching or allergies.   Yes [provider]  losartan-hydrochlorothiazide (HYZAAR) 100-25 MG tablet Take 1 tablet daily by mouth. 08/06/17  Yes Belva Crome, MD  metoprolol succinate (TOPROL-XL) 100 MG 24 hr tablet Take 1.5 tablets (150 mg) daily with or immediately following a meal. 08/06/17  Yes Tamala Julian,  Lynnell Dike, MD  umeclidinium-vilanterol (ANORO ELLIPTA) 62.5-25 MCG/INH AEPB Inhale 1 puff into the lungs daily. 06/05/17  Yes Javier Glazier, MD  ADVAIR DISKUS 250-50 MCG/DOSE AEPB Inhale 1 puff into the lungs daily. 02/16/17   [provider]  escitalopram (LEXAPRO) 10 MG tablet Take 10 mg by mouth daily.    [provider]  hydrOXYzine (ATARAX/VISTARIL) 25 MG tablet Take 25 mg by mouth 3 (three) times daily as needed for anxiety.    [provider]  loratadine (CLARITIN) 10 MG tablet Take 1 tablet (10 mg total) by mouth daily. Patient taking differently: Take 10 mg daily as needed by mouth for allergies.  01/07/17   Magdalen Spatz, NP  potassium chloride SA (K-DUR,KLOR-CON) 20 MEQ tablet Take 1 tablet (20 mEq total) by mouth daily. Patient not taking: Reported on 05/29/2017 01/01/17   Arrien, Jimmy Picket, MD  Spacer/Aero-Holding Josiah Lobo (AEROCHAMBER MV) inhaler Use as instructed Patient not taking: Reported on 08/14/2017 02/21/17   Magdalen Spatz, NP    Physical Exam: Vitals:   08/14/17 2030 08/14/17 2303 08/14/17 2346 08/14/17 2358  BP: (!) 124/57 (!) 112/54 116/62   Pulse: 93 96 100   Resp: 17 (!) 21 (!) 30   Temp:      TempSrc:      SpO2: 99% (!) 89% 91% 91%  Weight:      Height:          Constitutional: NAD, calm, comfortable Vitals:   08/14/17 2030 08/14/17 2303 08/14/17 2346 08/14/17 2358  BP: (!) 124/57 (!) 112/54 116/62   Pulse: 93 96 100   Resp: 17 (!) 21 (!) 30   Temp:      TempSrc:      SpO2: 99% (!) 89% 91% 91%  Weight:      Height:       Eyes: PERRL, lids and conjunctivae normal ENMT: Mucous membranes are moist. Posterior pharynx clear of any exudate or lesions.Normal dentition.  Neck: normal, supple, no masses, no thyromegaly Respiratory: clear to auscultation bilaterally, no wheezing, no crackles. Normal respiratory effort. No accessory muscle use.  Cardiovascular: Regular rate and rhythm, no murmurs / rubs / gallops. No extremity edema. 2+ pedal pulses. No carotid bruits.  Abdomen: no tenderness, no masses palpated. No hepatosplenomegaly. Bowel sounds positive.  Musculoskeletal: no clubbing / cyanosis. No joint deformity upper and lower extremities. Good ROM, no contractures. Normal muscle tone.  Skin: no rashes, lesions, ulcers. No induration Neurologic: CN 2-12 grossly intact. Sensation intact, DTR normal. Strength 5/5 in all 4.  Psychiatric: Normal judgment and insight. Alert and oriented x 3. Normal mood.    Labs on Admission: I have personally reviewed following labs and imaging studies  CBC: Recent Labs  Lab 08/14/17 1949  WBC 13.1*  NEUTROABS 11.2*  HGB 10.8*    HCT 33.8*  MCV 91.6  PLT 542   Basic Metabolic Panel: Recent Labs  Lab 08/14/17 1949  NA 136  K 3.8  CL 95*  CO2 33*  GLUCOSE 131*  BUN 19  CREATININE 0.88  CALCIUM 8.8*   GFR: Estimated Creatinine Clearance: 75 mL/min (by C-G formula based on SCr of 0.88 mg/dL). Liver Function Tests: Recent Labs  Lab 08/14/17 1949  AST 14*  ALT 10*  ALKPHOS 87  BILITOT 0.7  PROT 7.0  ALBUMIN 3.3*   No results for input(s): LIPASE, AMYLASE in the last 168 hours. No results for input(s): AMMONIA in the last 168 hours. Coagulation Profile: No results  for input(s): INR, PROTIME in the last 168 hours. Cardiac Enzymes: No results for input(s): CKTOTAL, CKMB, CKMBINDEX, TROPONINI in the last 168 hours. BNP (last 3 results) No results for input(s): PROBNP in the last 8760 hours. HbA1C: No results for input(s): HGBA1C in the last 72 hours. CBG: No results for input(s): GLUCAP in the last 168 hours. Lipid Profile: No results for input(s): CHOL, HDL, LDLCALC, TRIG, CHOLHDL, LDLDIRECT in the last 72 hours. Thyroid Function Tests: No results for input(s): TSH, T4TOTAL, FREET4, T3FREE, THYROIDAB in the last 72 hours. Anemia Panel: No results for input(s): VITAMINB12, FOLATE, FERRITIN, TIBC, IRON, RETICCTPCT in the last 72 hours. Urine analysis:    Component Value Date/Time   COLORURINE YELLOW 12/04/2016 0425   APPEARANCEUR HAZY (A) 12/04/2016 0425   LABSPEC >1.046 (H) 12/04/2016 0425   PHURINE 5.0 12/04/2016 0425   GLUCOSEU NEGATIVE 12/04/2016 0425   HGBUR NEGATIVE 12/04/2016 0425   BILIRUBINUR NEGATIVE 12/04/2016 0425   KETONESUR NEGATIVE 12/04/2016 0425   PROTEINUR NEGATIVE 12/04/2016 0425   NITRITE NEGATIVE 12/04/2016 0425   LEUKOCYTESUR NEGATIVE 12/04/2016 0425   Sepsis Labs: !!!!!!!!!!!!!!!!!!!!!!!!!!!!!!!!!!!!!!!!!!!! @LABRCNTIP (procalcitonin:4,lacticidven:4) )No results found for this or any previous visit (from the past 240 hour(s)).   Radiological Exams on  Admission: Dg Chest 2 View  Result Date: 08/14/2017 CLINICAL DATA:  71 year old female with shortness of breath. EXAM: CHEST  2 VIEW COMPARISON:  Chest CT dated 08/07/2017 FINDINGS: An area of opacity in the right mid lung field and right hilar region corresponds to the radiation changes seen on the prior CT. Left upper lobe streaky densities also seen on the prior CT. The previously seen nodule/ neoplasm in the left upper lobe is not well visualized on this radiograph. No new consolidative changes. There is no pleural effusion or pneumothorax. There is right pleural thickening. There is cardiomegaly with coronary vascular calcification. There is atherosclerotic calcification of the aortic arch. No acute osseous pathology. IMPRESSION: 1. No acute cardiopulmonary process. 2. 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 3. Cardiomegaly. Electronically Signed   By: Anner Crete M.D.   On: 08/14/2017 21:19     Assessment/Plan 71 yo female with copde  Principal Problem:   COPD exacerbation ([place])- zpack, prednisone, freq nebs.  She may just need increase in oxygen while ambuating.  Active Problems:   Acute on chronic respiratory failure with hypoxia ([place])-  noted   Essential hypertension- cont home meds   Squamous cell lung cancer, right ([place])- noted   Chronic diastolic heart failure ([place])- stable at this time   Home meds pending   DVT prophylaxis:  scds Code Status:  full Family Communication:  none  Disposition Plan:  Per day team Consults called:  none Admission status:  observation   Shaune Malacara A MD [facility]  If 7PM-7AM, please contact night-coverage [email or web address] Password [email or web address]  08/15/2017, 12:06 AM

## 2017-08-15 NOTE — Consult Note (Signed)
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 Shortness of Breath  : I had difficulty breathing.  HPI: [name] is a very charming 71 year old white female.  She has significant COPD.  Back in the Spring, she was found to have a mass in the left lung.  She was found to be diagnosed with a squamous cell cancer.  This was locally advanced disease.  Because of her poor performance status she was not deemed a surgical candidate.  She was not felt to be a candidate for chemotherapy.  She received radiation therapy.  She has some inpatient radiation therapy.  She then had SRS as an outpatient.  She completed this in late September.  She did have a follow-up CT scan.  This is done on November 7.  This showed decrease in the primary left upper lobe nodule measuring 1.1 cm.  She has some radiation changes in the left upper lung and right lung.  She had some mild prominent mediastinal lymph nodes with a 12 mm node in the right paratracheal region.  Again, she has very "delicate" lungs.  She began to have some shortness of breath.  She ultimately was admitted yesterday for COPD exacerbation.  Chest x-ray was done on admission.  This showed some post radiation fibrosis in the right midlung and left upper lung.  There was some cardiomegaly.  Her labs looked okay.  Her hemoglobin was 10.8.  White cell count 13.1.  Creatinine 0.88.  Calcium 8.8 with an albumin of 3.3.  She has had no hemoptysis.  She is had a little bit of productive cough.  She has had no fever.  Her appetite is good.  There is no nausea or vomiting.  Overall, her performance status is ECOG 1-2.    Past Medical History:  Diagnosis Date  . Cancer ([place])   . COPD, severe ([place])   . HTN (hypertension)   . Pneumonia   . Pulmonary emphysema ([place])   . SOB (shortness of breath)    :  Past Surgical History:  Procedure Laterality Date  . CATARACT EXTRACTION    . ENDOBRONCHIAL ULTRASOUND Bilateral 12/17/2016   Procedure: ENDOBRONCHIAL ULTRASOUND;  Surgeon: Javier Glazier, MD;  Location: [facility];  Service: Cardiopulmonary;  Laterality: Bilateral;  . IR GENERIC HISTORICAL  12/21/2016   IR SINUS/FIST TUBE CHK-NON GI 12/21/2016 [facility]  . IR GENERIC HISTORICAL  12/21/2016   IR US GUIDE BX ASP/DRAIN 12/21/2016 Corrie Mckusick, DO [facility]  . IR RADIOLOGIST EVAL & MGMT  01/17/2017  :   Current Facility-Administered Medications:  .  0.9 %  sodium chloride infusion, 250 mL, Intravenous, PRN, Derrill Kay A, MD .  albuterol (PROVENTIL) (2.5 MG/3ML) 0.083% nebulizer solution 2.5 mg, 2.5 mg, Nebulization, Q6H, Derrill Kay A, MD, 2.5 mg at 08/15/17 0129 .  albuterol (PROVENTIL) (2.5 MG/3ML) 0.083% nebulizer solution 2.5 mg, 2.5 mg, Nebulization, Q2H PRN, Derrill Kay A, MD .  amLODipine (NORVASC) tablet 10 mg, 10 mg, Oral, Daily, Shanon Brow, Rachal A, MD .  azithromycin (ZITHROMAX) tablet 500 mg, 500 mg, Oral, Daily **FOLLOWED BY** [START ON 08/16/2017] azithromycin (ZITHROMAX) tablet 250 mg, 250 mg, Oral, Daily, Derrill Kay A, MD .  guaiFENesin (MUCINEX) 12 hr tablet 600 mg, 600 mg, Oral, BID, Derrill Kay A, MD, 600 mg at 08/15/17 0122 .  [START ON 08/16/2017] Influenza vac split quadrivalent PF (FLUZONE  HIGH-DOSE) injection 0.5 mL, 0.5 mL, Intramuscular, Tomorrow-1000, David, Rachal A, MD .  ipratropium (ATROVENT) nebulizer solution 0.5 mg, 0.5 mg, Nebulization, Q6H, Derrill Kay A, MD, 0.5 mg at 08/15/17 0129 .  MEDLINE mouth rinse, 15 mL, Mouth Rinse, BID, Derrill Kay A, MD .  predniSONE (DELTASONE) tablet 30 mg, 30 mg, Oral, BID WC, David, Rachal A, MD .  sodium chloride flush (NS) 0.9 % injection 3 mL, 3 mL, Intravenous, Q12H, David, Rachal A, MD .  sodium chloride flush (NS) 0.9 % injection 3 mL, 3 mL, Intravenous, PRN, Derrill Kay A, MD:  . albuterol   2.5 mg Nebulization Q6H  . amLODipine  10 mg Oral Daily  . azithromycin  500 mg Oral Daily   Followed by  . [START ON 08/16/2017] azithromycin  250 mg Oral Daily  . guaiFENesin  600 mg Oral BID  . [START ON 08/16/2017] Influenza vac split quadrivalent PF  0.5 mL Intramuscular Tomorrow-1000  . ipratropium  0.5 mg Nebulization Q6H  . mouth rinse  15 mL Mouth Rinse BID  . predniSONE  30 mg Oral BID WC  . sodium chloride flush  3 mL Intravenous Q12H  :  Allergies  Allergen Reactions  . Iohexol Rash    Pt has had two previous ct scans with a reaction: hives.  . Ciprofloxacin Rash    Rash possibly caused by Cipro?  . Flagyl [Metronidazole] Rash    Rash possibly caused by Flagyl?  Marland Kitchen Zosyn [Piperacillin Sod-Tazobactam So] Itching and Rash    Has patient had a PCN reaction causing immediate rash, facial/tongue/throat swelling, SOB or lightheadedness with hypotension: No Has patient had a PCN reaction causing severe rash involving mucus membranes or skin necrosis: No Has patient had a PCN reaction that required hospitalization: No Has patient had a PCN reaction occurring within the last 10 years: Yes If all of the above answers are "NO", then may proceed with Cephalosporin use.   :  Family History  Problem Relation Age of Onset  . Allergies Unknown   . COPD Cousin   . Breast cancer Mother   . Diabetes Father   . Diabetes Son   :  Social History   Socioeconomic History  . Marital status: Widowed    Spouse name: Not on file  . Number of children: Not on file  . Years of education: Not on file  . Highest education level: Not on file  Social Needs  . Financial resource strain: Not on file  . Food insecurity - worry: Not on file  . Food insecurity - inability: Not on file  . Transportation needs - medical: Not on file  . Transportation needs - non-medical: Not on file  Occupational History  . Occupation: retired  Tobacco Use  . Smoking status: Former Smoker    Packs/day:  0.50    Years: 50.00    Pack years: 25.00    Types: Cigarettes    Last attempt to quit: 11/01/2016    Years since quitting: 0.7  . Smokeless tobacco: Never Used  Substance and Sexual Activity  . Alcohol use: No  . Drug use: No  . Sexual activity: Not on file  Other Topics Concern  . Not on file  Social History Narrative    Pulmonary (12/10/16):   Patient's widower son and grandchildren moved in with her. Her husband passed years ago. Previously worked in [employer].  :  Pertinent items are noted in HPI.  Exam: Physical exam as noted in the  medical record. Patient Vitals for the past 24 hrs:  BP Temp Temp src Pulse Resp SpO2 Height Weight  08/15/17 0549 136/79 98.2 F (36.8 C) Oral (!) 118 18 100 % - -  08/15/17 0124 132/79 98 F (36.7 C) Oral (!) 102 20 100 % - -  08/15/17 0030 120/69 - - 100 (!) 21 90 % - -  08/14/17 2358 - - - - - 91 % - -  08/14/17 2346 116/62 - - 100 (!) 30 91 % - -  08/14/17 2330 116/62 - - (!) 102 (!) 23 (!) 88 % - -  08/14/17 2303 (!) 112/54 - - 96 (!) 21 (!) 89 % - -  08/14/17 2300 (!) 112/54 - - 95 17 93 % - -  08/14/17 2030 (!) 124/57 - - 93 17 99 % - -  08/14/17 2000 131/62 - - 98 (!) 24 (!) 88 % - -  08/14/17 1930 (!) 141/66 - - (!) 104 (!) 24 (!) 69 % - -  08/14/17 1910 - - - (!) 106 (!) 22 91 % - -  08/14/17 1907 - - - - - - 5\' 7"  (1.702 m) 230 lb (104.3 kg)  08/14/17 1906 (!) 152/73 98 F (36.7 C) Oral (!) 105 15 94 % - -  08/14/17 1849 - - - - - 98 % - -     Recent Labs    08/14/17 1949  WBC 13.1*  HGB 10.8*  HCT 33.8*  PLT 266   Recent Labs    08/14/17 1949  NA 136  K 3.8  CL 95*  CO2 33*  GLUCOSE 131*  BUN 19  CREATININE 0.88  CALCIUM 8.8*    Blood smear review: None  Pathology: None    Assessment and Plan: [name] is a 71 year old white female.  She has bad underlying COPD.  She was treated with radiation therapy for her locally advanced non-small cell lung cancer.  We have been trying to hold off on  systemic chemotherapy.  So far, we have done a good job and not having to give her treatment with chemotherapy which I think would be a detriment to her quality of life.  She had a recent CT scan done.  This showed improvement of her malignancy.  I probably would not repeat another scan until February.  Hopefully, she will be able to go home in a couple days.  She actually looks pretty good to me.  I cannot think of anything that we have to do with her right now.  She is a little bit anemic.  I will check her iron studies.  As always, I know that she will get fantastic care from all the staff on [address].  Lattie Haw, MD  1 Chronicles 16:34

## 2017-08-16 DIAGNOSIS — K219 Gastro-esophageal reflux disease without esophagitis: Secondary | ICD-10-CM

## 2017-08-16 DIAGNOSIS — F419 Anxiety disorder, unspecified: Secondary | ICD-10-CM

## 2017-08-16 LAB — COMPREHENSIVE METABOLIC PANEL
ALT: 19 U/L (ref 14–54)
AST: 23 U/L (ref 15–41)
Albumin: 3.4 g/dL — ABNORMAL LOW (ref 3.5–5.0)
Alkaline Phosphatase: 89 U/L (ref 38–126)
Anion gap: 7 (ref 5–15)
BUN: 28 mg/dL — AB (ref 6–20)
CHLORIDE: 94 mmol/L — AB (ref 101–111)
CO2: 33 mmol/L — ABNORMAL HIGH (ref 22–32)
Calcium: 9.1 mg/dL (ref 8.9–10.3)
Creatinine, Ser: 0.9 mg/dL (ref 0.44–1.00)
GFR calc Af Amer: >60 mL/min (ref >60)
GFR calc non Af Amer: >60 mL/min (ref >60)
GLUCOSE: 148 mg/dL — AB (ref 65–99)
POTASSIUM: 4.5 mmol/L (ref 3.5–5.1)
SODIUM: 134 mmol/L — AB (ref 135–145)
Total Bilirubin: 0.6 mg/dL (ref 0.3–1.2)
Total Protein: 7.5 g/dL (ref 6.5–8.1)

## 2017-08-16 LAB — CBC WITH DIFFERENTIAL/PLATELET
BASOS ABS: 0 10*3/uL (ref 0.0–0.1)
BASOS PCT: 0 %
EOS ABS: 0 10*3/uL (ref 0.0–0.7)
EOS PCT: 0 %
HCT: 33.4 % — ABNORMAL LOW (ref 36.0–46.0)
Hemoglobin: 10.6 g/dL — ABNORMAL LOW (ref 12.0–15.0)
Lymphocytes Relative: 4 %
Lymphs Abs: 0.5 10*3/uL — ABNORMAL LOW (ref 0.7–4.0)
MCH: 29.1 pg (ref 26.0–34.0)
MCHC: 31.7 g/dL (ref 30.0–36.0)
MCV: 91.8 fL (ref 78.0–100.0)
MONO ABS: 0.1 10*3/uL (ref 0.1–1.0)
Monocytes Relative: 1 %
NEUTROS ABS: 12.9 10*3/uL — AB (ref 1.7–7.7)
Neutrophils Relative %: 95 %
PLATELETS: 277 10*3/uL (ref 150–400)
RBC: 3.64 MIL/uL — ABNORMAL LOW (ref 3.87–5.11)
RDW: 13.8 % (ref 11.5–15.5)
WBC: 13.6 10*3/uL — ABNORMAL HIGH (ref 4.0–10.5)

## 2017-08-16 MED ORDER — GI COCKTAIL ~~LOC~~
30.0000 mL | Freq: Three times a day (TID) | ORAL | Status: DC | PRN
  Administered 2017-08-18: 30 mL via ORAL
  Filled 2017-08-16 (×2): qty 30

## 2017-08-16 MED ORDER — IPRATROPIUM-ALBUTEROL 0.5-2.5 (3) MG/3ML IN SOLN
3.0000 mL | Freq: Four times a day (QID) | RESPIRATORY_TRACT | Status: DC
  Administered 2017-08-16 – 2017-08-18 (×9): 3 mL via RESPIRATORY_TRACT
  Filled 2017-08-16 (×9): qty 3

## 2017-08-16 MED ORDER — UMECLIDINIUM-VILANTEROL 62.5-25 MCG/INH IN AEPB
1.0000 | INHALATION_SPRAY | Freq: Every day | RESPIRATORY_TRACT | Status: DC
  Administered 2017-08-17 – 2017-08-18 (×2): 1 via RESPIRATORY_TRACT
  Filled 2017-08-16: qty 14

## 2017-08-16 MED ORDER — FAMOTIDINE 20 MG PO TABS
20.0000 mg | ORAL_TABLET | Freq: Every day | ORAL | Status: DC
  Administered 2017-08-16 – 2017-08-18 (×3): 20 mg via ORAL
  Filled 2017-08-16 (×3): qty 1

## 2017-08-16 MED ORDER — DIPHENHYDRAMINE HCL 25 MG PO CAPS: 25.0000 mg | ORAL_CAPSULE | Freq: Three times a day (TID) | ORAL | Status: DC | PRN

## 2017-08-16 NOTE — Progress Notes (Signed)
[facility] PROGRESS NOTE  Alisha Fisher EPP:[id number] DOB: 28-Jan-1946 DOA: 08/14/2017 PCP: [name], MD  Interim summary and HPI 71 y.o. female with medical history significant of lung cancer, copd, anxiety comes in with sob and cough for several days.  Pt is on 3 liters [place] at home already.  She was given nebs and prednisone and has improved but when she ambulates her sats go down to 78% on 2 liters.  Denies fevers.  No chest pain.  No le edema or swelling.  Referred for admission for copd exacerbation.  Assessment/Plan: 1-acute on chronic resp failure with hypoxia: due to COPD exacerbation and bronchiectasis in setting of radiation pneumonitis and underlying lung cancer. -will continue treatment with IV steroids, pulmicort and Anoro. -patient on antibiotics, Duoneb and 3-4L [place] for oxygen supplementation -still SOB, with difficulty speaking in full sentences and desaturation while engaging on exertion. -continue PRN antitussives and the use of flutter valve.  2-essential HTN -continue home antihypertensive drugs -heart healthy diet ordered  3-GERD -will use Pepcid and PRN GI cocktail   4-anxiety -continue PRN atarax  5-chronic diastolic heart failure -compensated -will continue home medication regimen  -follow daily weights and monitor I's & O's  6-squamous cell lung cancer, right -oncology on board -will follow recommendations  7-morbid obesity -Body mass index is 36.02 kg/m. -low calorie diet and weight loss recommended -might also benefit of outpatient sleep study  Code Status: Full Family Communication: no family at bedside  Disposition Plan: remains inpatient, will continue IV solumedrol, continue antibiotics, continue pulmicort and Anoro. Will try to wean oxygen to home supplementation and follow clinical response.   Consultants:  Oncology aware of patient admission (Dr. Marin Olp)  Procedures:  See below for x-ray reports    Antibiotics:  zithromax 08/15/17  HPI/Subjective: Afebrile, no CP. Still complaining of SOB and with difficulty speaking in full sentences.  Objective: Vitals:   08/16/17 1347 08/16/17 1451  BP: (!) 148/57   Pulse: 92   Resp: 18   Temp: 97.7 F (36.5 C)   SpO2: 98% 98%    Intake/Output Summary (Last 24 hours) at 08/16/2017 1607 Last data filed at 08/16/2017 0841 Gross per 24 hour  Intake 360 ml  Output -  Net 360 ml   Filed Weights   08/14/17 1907  Weight: 104.3 kg (230 lb)    Exam:   General:  Afebrile, no CP, complaining of coughing spells and still SOB with minimal exertion. Patient also noted with difficulty speaking in full sentences and is complaining of GERD.  Cardiovascular: S1 and S2, no rubs, no gallops, no murmur  Respiratory: wearing 3L of oxygen supplementation (chronically on 2L); decrease air movement bilaterally, positive exp wheezing and positive rhonchi  Abdomen: soft, ND, NF, positive BS  Musculoskeletal:  Trace edema, no cyanosis, no clubbing   Data Reviewed: Basic Metabolic Panel: Recent Labs  Lab 08/14/17 1949 08/16/17 0410  NA 136 134*  K 3.8 4.5  CL 95* 94*  CO2 33* 33*  GLUCOSE 131* 148*  BUN 19 28*  CREATININE 0.88 0.90  CALCIUM 8.8* 9.1   Liver Function Tests: Recent Labs  Lab 08/14/17 1949 08/16/17 0410  AST 14* 23  ALT 10* 19  ALKPHOS 87 89  BILITOT 0.7 0.6  PROT 7.0 7.5  ALBUMIN 3.3* 3.4*   CBC: Recent Labs  Lab 08/14/17 1949 08/16/17 0410  WBC 13.1* 13.6*  NEUTROABS 11.2* 12.9*  HGB 10.8* 10.6*  HCT 33.8* 33.4*  MCV 91.6 91.8  PLT  266 277   BNP (last 3 results) Recent Labs    12/30/16 1209 12/31/16 0103 08/14/17 1949  BNP 626.5* 1,330.1* 146.0*    Studies: Dg Chest 2 View  Result Date: 08/14/2017 CLINICAL DATA:  71 year old female with shortness of breath. EXAM: CHEST  2 VIEW COMPARISON:  Chest CT dated 08/07/2017 FINDINGS: An area of opacity in the right mid lung field and right hilar  region corresponds to the radiation changes seen on the prior CT. Left upper lobe streaky densities also seen on the prior CT. The previously seen nodule/ neoplasm in the left upper lobe is not well visualized on this radiograph. No new consolidative changes. There is no pleural effusion or pneumothorax. There is right pleural thickening. There is cardiomegaly with coronary vascular calcification. There is atherosclerotic calcification of the aortic arch. No acute osseous pathology. IMPRESSION: 1. No acute cardiopulmonary process. 2. 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 3. Cardiomegaly. Electronically Signed   By: Anner Crete M.D.   On: 08/14/2017 21:19    Scheduled Meds: . amLODipine  10 mg Oral Daily  . azithromycin  250 mg Oral Daily  . budesonide (PULMICORT) nebulizer solution  0.5 mg Nebulization BID  . famotidine  20 mg Oral Daily  . guaiFENesin  600 mg Oral BID  . ipratropium-albuterol  3 mL Nebulization Q6H  . loratadine  10 mg Oral Daily  . losartan  100 mg Oral Daily  . methylPREDNISolone (SOLU-MEDROL) injection  40 mg Intravenous Q8H  . metoprolol succinate  25 mg Oral Daily  . sodium chloride flush  3 mL Intravenous Q12H  . umeclidinium-vilanterol  1 puff Inhalation Daily   Continuous Infusions: . sodium chloride       Time spent: 30 minutes    [facility] Pager [phone]. If 7PM-7AM, please contact night-coverage at [email or web address], password [facility] 08/16/2017, 4:07 PM  LOS: 1 day

## 2017-08-17 DIAGNOSIS — I5032 Chronic diastolic (congestive) heart failure: Secondary | ICD-10-CM

## 2017-08-17 DIAGNOSIS — J9622 Acute and chronic respiratory failure with hypercapnia: Secondary | ICD-10-CM

## 2017-08-17 DIAGNOSIS — I10 Essential (primary) hypertension: Secondary | ICD-10-CM

## 2017-08-17 DIAGNOSIS — J9621 Acute and chronic respiratory failure with hypoxia: Secondary | ICD-10-CM

## 2017-08-17 DIAGNOSIS — C3491 Malignant neoplasm of unspecified part of right bronchus or lung: Secondary | ICD-10-CM

## 2017-08-17 DIAGNOSIS — K5900 Constipation, unspecified: Secondary | ICD-10-CM

## 2017-08-17 LAB — CBC
HEMATOCRIT: 31.9 % — AB (ref 36.0–46.0)
HEMOGLOBIN: 10.1 g/dL — AB (ref 12.0–15.0)
MCH: 28.9 pg (ref 26.0–34.0)
MCHC: 31.7 g/dL (ref 30.0–36.0)
MCV: 91.4 fL (ref 78.0–100.0)
Platelets: 276 10*3/uL (ref 150–400)
RBC: 3.49 MIL/uL — ABNORMAL LOW (ref 3.87–5.11)
RDW: 13.9 % (ref 11.5–15.5)
WBC: 13.7 10*3/uL — AB (ref 4.0–10.5)

## 2017-08-17 LAB — BASIC METABOLIC PANEL
ANION GAP: 6 (ref 5–15)
BUN: 32 mg/dL — ABNORMAL HIGH (ref 6–20)
CHLORIDE: 96 mmol/L — AB (ref 101–111)
CO2: 32 mmol/L (ref 22–32)
Calcium: 8.9 mg/dL (ref 8.9–10.3)
Creatinine, Ser: 0.85 mg/dL (ref 0.44–1.00)
GFR calc non Af Amer: >60 mL/min (ref >60)
Glucose, Bld: 138 mg/dL — ABNORMAL HIGH (ref 65–99)
Potassium: 5.1 mmol/L (ref 3.5–5.1)
Sodium: 134 mmol/L — ABNORMAL LOW (ref 135–145)

## 2017-08-17 MED ORDER — FLUTICASONE PROPIONATE 50 MCG/ACT NA SUSP
2.0000 | Freq: Every day | NASAL | Status: DC
  Administered 2017-08-17 – 2017-08-18 (×2): 2 via NASAL
  Filled 2017-08-17: qty 16

## 2017-08-17 MED ORDER — POLYETHYLENE GLYCOL 3350 17 G PO PACK
17.0000 g | PACK | Freq: Two times a day (BID) | ORAL | Status: DC
  Administered 2017-08-17 – 2017-08-18 (×3): 17 g via ORAL
  Filled 2017-08-17 (×3): qty 1

## 2017-08-17 MED ORDER — SORBITOL 70 % SOLN
30.0000 mL | Freq: Once | Status: AC
  Administered 2017-08-17: 30 mL via ORAL
  Filled 2017-08-17: qty 30

## 2017-08-17 MED ORDER — GUAIFENESIN ER 600 MG PO TB12
1200.0000 mg | ORAL_TABLET | Freq: Two times a day (BID) | ORAL | Status: DC
  Administered 2017-08-17 – 2017-08-18 (×3): 1200 mg via ORAL
  Filled 2017-08-17 (×3): qty 2

## 2017-08-17 MED ORDER — HYDROCODONE-HOMATROPINE 5-1.5 MG/5ML PO SYRP
5.0000 mL | ORAL_SOLUTION | Freq: Four times a day (QID) | ORAL | Status: DC | PRN
  Administered 2017-08-17 – 2017-08-18 (×3): 5 mL via ORAL
  Filled 2017-08-17 (×3): qty 5

## 2017-08-17 MED ORDER — SENNOSIDES-DOCUSATE SODIUM 8.6-50 MG PO TABS
1.0000 | ORAL_TABLET | Freq: Every day | ORAL | Status: DC
  Administered 2017-08-17: 1 via ORAL
  Filled 2017-08-17: qty 1

## 2017-08-17 MED ORDER — HYDROCHLOROTHIAZIDE 25 MG PO TABS
25.0000 mg | ORAL_TABLET | Freq: Every day | ORAL | Status: DC
  Administered 2017-08-18: 25 mg via ORAL
  Filled 2017-08-17: qty 1

## 2017-08-17 NOTE — Progress Notes (Signed)
PROGRESS NOTE    Alisha Fisher  ZOX:[id number] DOB: [birth year] DOA: 08/14/2017 PCP: [name], MD    Brief Narrative:  71 y.o.femalewith medical history significant oflung cancer, copd, anxiety comes in with sob and cough for several days. Pt is on 3 liters [place] at home already. She was given nebs and prednisone and has improved but when she ambulates her sats go down to 78% on 2 liters. Denies fevers. No chest pain. No le edema or swelling. Referred for admission for copd exacerbation.     Assessment & Plan:   Principal Problem:   COPD exacerbation ([place]) Active Problems:   Acute on chronic respiratory failure with hypoxia and hypercapnia ([facility])   Essential hypertension   Squamous cell lung cancer, right ([facility])   Chronic diastolic heart failure ([facility])  #1 acute on chronic respiratory failure with hypoxia secondary to acute COPD exacerbation and bronchiectasis in the setting of radiation pneumonitis and underlying lung cancer Patient improving slowly however still with significant cough and shortness of breath.  Still with O2 requirements not quite at baseline.  Increase Mucinex to 1200 twice daily.  Continue current regimen of IV steroid taper, Pulmicort, Anoro, Claritin, Pepcid, scheduled duo nebs, antitussives.  Add Flonase.  2.  Hypertension Continue current regimen of antihypertensive medications with Norvasc, Cozaar.  3.  Gastroesophageal reflux disease Continue Pepcid and GI cocktail as needed.  4.  Anxiety Atarax as needed.  5.  Chronic diastolic heart failure Compensated.  Patient euvolemic.  Continue Cozaar.  Hold beta-blocker secondary to problem #1.  6.  Squamous cell carcinoma of the lung, right Per oncology.  Outpatient follow-up.  7.  Morbid obesity BMI is 36.  Continue low calorie diet and weight loss recommended.  #8 constipation Placed on MiraLAX 17 g twice daily, Senokot-S nightly.  Will give a dose of sorbitol p.o. x1.   DVT prophylaxis:  SCDs Code Status: Full Family Communication: Updated patient.  No family at bedside. Disposition Plan: Home once oxygenation has improved and O2 wean back to home O2 supplementation.   Consultants:   Oncology: Dr. Marin Olp 08/15/2017      Procedures:   CXR 08/14/2017  Antimicrobials:   zithromax 08/15/17       Subjective: Patient with complaints of SOB. No CP.  Patient complained of ongoing cough.  Objective: Vitals:   08/17/17 0448 08/17/17 0819 08/17/17 1045 08/17/17 1048  BP: 131/73  (!) 170/73 (!) 170/73  Pulse: 96   94  Resp: 14     Temp: 97.6 F (36.4 C)     TempSrc: Oral     SpO2: 95% 95%  95%  Weight:      Height:       No intake or output data in the 24 hours ending 08/17/17 1152 Filed Weights   08/14/17 1907  Weight: 104.3 kg (230 lb)    Examination:  General exam: Appears calm and comfortable  Respiratory system: Minimal to mild expiratory wheezing.  No crackles.  No rhonchi. Respiratory effort normal. Cardiovascular system: S1 & S2 heard, RRR. No JVD, murmurs, rubs, gallops or clicks. No pedal edema. Gastrointestinal system: Abdomen is nondistended, soft and nontender. No organomegaly or masses felt. Normal bowel sounds heard. Central nervous system: Alert and oriented. No focal neurological deficits. Extremities: Symmetric 5 x 5 power. Skin: No rashes, lesions or ulcers Psychiatry: Judgement and insight appear normal. Mood & affect appropriate.     Data Reviewed: I have personally reviewed following labs and imaging studies  CBC: Recent  Labs  Lab 08/14/17 1949 08/16/17 0410 08/17/17 0418  WBC 13.1* 13.6* 13.7*  NEUTROABS 11.2* 12.9*  --   HGB 10.8* 10.6* 10.1*  HCT 33.8* 33.4* 31.9*  MCV 91.6 91.8 91.4  PLT 266 277 546   Basic Metabolic Panel: Recent Labs  Lab 08/14/17 1949 08/16/17 0410 08/17/17 0418  NA 136 134* 134*  K 3.8 4.5 5.1  CL 95* 94* 96*  CO2 33* 33* 32  GLUCOSE 131* 148* 138*  BUN 19 28* 32*  CREATININE  0.88 0.90 0.85  CALCIUM 8.8* 9.1 8.9   GFR: Estimated Creatinine Clearance: 77.6 mL/min (by C-G formula based on SCr of 0.85 mg/dL). Liver Function Tests: Recent Labs  Lab 08/14/17 1949 08/16/17 0410  AST 14* 23  ALT 10* 19  ALKPHOS 87 89  BILITOT 0.7 0.6  PROT 7.0 7.5  ALBUMIN 3.3* 3.4*   No results for input(s): LIPASE, AMYLASE in the last 168 hours. No results for input(s): AMMONIA in the last 168 hours. Coagulation Profile: No results for input(s): INR, PROTIME in the last 168 hours. Cardiac Enzymes: No results for input(s): CKTOTAL, CKMB, CKMBINDEX, TROPONINI in the last 168 hours. BNP (last 3 results) No results for input(s): PROBNP in the last 8760 hours. HbA1C: No results for input(s): HGBA1C in the last 72 hours. CBG: No results for input(s): GLUCAP in the last 168 hours. Lipid Profile: No results for input(s): CHOL, HDL, LDLCALC, TRIG, CHOLHDL, LDLDIRECT in the last 72 hours. Thyroid Function Tests: No results for input(s): TSH, T4TOTAL, FREET4, T3FREE, THYROIDAB in the last 72 hours. Anemia Panel: Recent Labs    08/15/17 0711  FERRITIN 186  TIBC 276  IRON 45   Sepsis Labs: No results for input(s): PROCALCITON, LATICACIDVEN in the last 168 hours.  No results found for this or any previous visit (from the past 240 hour(s)).       Radiology Studies: No results found.      Scheduled Meds: . amLODipine  10 mg Oral Daily  . azithromycin  250 mg Oral Daily  . budesonide (PULMICORT) nebulizer solution  0.5 mg Nebulization BID  . famotidine  20 mg Oral Daily  . guaiFENesin  1,200 mg Oral BID  . ipratropium-albuterol  3 mL Nebulization Q6H  . loratadine  10 mg Oral Daily  . losartan  100 mg Oral Daily  . methylPREDNISolone (SOLU-MEDROL) injection  40 mg Intravenous Q8H  . metoprolol succinate  25 mg Oral Daily  . sodium chloride flush  3 mL Intravenous Q12H  . umeclidinium-vilanterol  1 puff Inhalation Daily   Continuous Infusions: . sodium  chloride       LOS: 2 days    Time spent: 35 mins    Irine Seal, MD [facility] Pager [phone]  If 7PM-7AM, please contact night-coverage [email or web address] Password [email or web address] 08/17/2017, 11:52 AM

## 2017-08-18 LAB — CBC
HEMATOCRIT: 32.4 % — AB (ref 36.0–46.0)
HEMOGLOBIN: 10.4 g/dL — AB (ref 12.0–15.0)
MCH: 29.5 pg (ref 26.0–34.0)
MCHC: 32.1 g/dL (ref 30.0–36.0)
MCV: 91.8 fL (ref 78.0–100.0)
Platelets: 311 10*3/uL (ref 150–400)
RBC: 3.53 MIL/uL — ABNORMAL LOW (ref 3.87–5.11)
RDW: 14 % (ref 11.5–15.5)
WBC: 13.4 10*3/uL — ABNORMAL HIGH (ref 4.0–10.5)

## 2017-08-18 LAB — BASIC METABOLIC PANEL
ANION GAP: 8 (ref 5–15)
BUN: 33 mg/dL — AB (ref 6–20)
CO2: 33 mmol/L — AB (ref 22–32)
Calcium: 8.7 mg/dL — ABNORMAL LOW (ref 8.9–10.3)
Chloride: 92 mmol/L — ABNORMAL LOW (ref 101–111)
Creatinine, Ser: 0.89 mg/dL (ref 0.44–1.00)
GFR calc Af Amer: >60 mL/min (ref >60)
GLUCOSE: 173 mg/dL — AB (ref 65–99)
POTASSIUM: 4.5 mmol/L (ref 3.5–5.1)
Sodium: 133 mmol/L — ABNORMAL LOW (ref 135–145)

## 2017-08-18 MED ORDER — HYDROCODONE-HOMATROPINE 5-1.5 MG/5ML PO SYRP: 5.0000 mL | ORAL_SOLUTION | Freq: Four times a day (QID) | ORAL | 0 refills | Status: DC | PRN

## 2017-08-18 MED ORDER — FLUTICASONE PROPIONATE 50 MCG/ACT NA SUSP: 2.0000 | Freq: Every day | NASAL | 0 refills | Status: DC

## 2017-08-18 MED ORDER — POLYETHYLENE GLYCOL 3350 17 G PO PACK: 17.0000 g | PACK | Freq: Two times a day (BID) | ORAL | 0 refills | Status: DC

## 2017-08-18 MED ORDER — SENNOSIDES-DOCUSATE SODIUM 8.6-50 MG PO TABS
1.0000 | ORAL_TABLET | Freq: Every day | ORAL | Status: DC
Start: 2017-08-18 — End: 2017-08-18

## 2017-08-18 MED ORDER — GUAIFENESIN ER 600 MG PO TB12: 1200.0000 mg | ORAL_TABLET | Freq: Two times a day (BID) | ORAL | 0 refills | Status: DC

## 2017-08-18 MED ORDER — AZITHROMYCIN 250 MG PO TABS: 250.0000 mg | ORAL_TABLET | Freq: Every day | ORAL | 0 refills | Status: DC

## 2017-08-18 MED ORDER — AZITHROMYCIN 250 MG PO TABS: 250.0000 mg | ORAL_TABLET | Freq: Every day | ORAL | 0 refills | Status: AC

## 2017-08-18 MED ORDER — SENNOSIDES-DOCUSATE SODIUM 8.6-50 MG PO TABS: 1.0000 | ORAL_TABLET | Freq: Every day | ORAL | Status: DC

## 2017-08-18 MED ORDER — PREDNISONE 20 MG PO TABS: 20.0000 mg | ORAL_TABLET | Freq: Every day | ORAL | 0 refills | Status: DC

## 2017-08-18 MED ORDER — GUAIFENESIN ER 600 MG PO TB12: 1200.0000 mg | ORAL_TABLET | Freq: Two times a day (BID) | ORAL | 0 refills | Status: AC

## 2017-08-18 MED ORDER — PREDNISONE 20 MG PO TABS
60.0000 mg | ORAL_TABLET | Freq: Every day | ORAL | Status: DC
  Administered 2017-08-18: 60 mg via ORAL
  Filled 2017-08-18: qty 3

## 2017-08-18 NOTE — Care Management Note (Signed)
Case Management Note  Patient Details  Name: Alisha Fisher MRN: [id number] Date of Birth: 1946-04-01  Subjective/Objective:   Lung cancer, COPD                 Action/Plan: Discharge Planning: Provided pt with patient assistance application for [place] for Anoro. Pt states she has difficulty paying for Rx meds. She does have Part D but her copay run $75 for inhalers. [facility] supplies her oxygen. Family to bring a portable.   PCP Horald Pollen MD  Expected Discharge Date:  08/18/17               Expected Discharge Plan:  Home/Self Care  In-House Referral:  NA  Discharge planning Services  CM Consult, Medication Assistance  Post Acute Care Choice:  NA Choice offered to:  NA  DME Arranged:  N/A DME Agency:  NA  HH Arranged:  NA HH Agency:  NA  Status of Service:  Completed, signed off  If discussed at [place] of Stay Meetings, dates discussed:    Additional Comments:  Erenest Rasher, RN 08/18/2017, 5:11 PM

## 2017-08-18 NOTE — Progress Notes (Signed)
Discharge teaching completed with teach back. Discharge instructions given and reviewed with pt. Prescriptions given for Azithromycin, Prednisone, Flonase, Mucinex, Hycodan, Miralax, Pt. understands to call for follow up appointments on Monday. Awaiting ride and oxygen for transport.

## 2017-08-18 NOTE — Care Management Important Message (Signed)
Important Message  Patient Details  Name: Alisha Fisher MRN: [id number] Date of Birth: [birth year]   Medicare Important Message Given:  Yes    Erenest Rasher, RN 08/18/2017, 4:43 PM

## 2017-08-18 NOTE — Discharge Summary (Addendum)
Physician Discharge Summary  Alisha Fisher QIO:[id number] DOB: [birth year] DOA: 08/14/2017  PCP: [name], MD  Admit date: 08/14/2017 Discharge date: 08/18/2017  Time spent: 60 minutes  Recommendations for Outpatient Follow-up:  1. Follow-up with Dr. Ashok Cordia, pulmonary in 1-2 weeks.  On follow-up patient COPD would need to be reassessed at that time. 2. Follow-up with [name], MD in 2 weeks.   Discharge Diagnoses:  Principal Problem:   Acute exacerbation of chronic obstructive pulmonary disease (COPD) ([place]) Active Problems:   Acute on chronic respiratory failure with hypoxia and hypercapnia (HCC)   Essential hypertension   Squamous cell lung cancer, right (HCC)   Chronic diastolic heart failure (HCC)   Constipation   Discharge Condition: Stable and improved  Diet recommendation: Heart healthy  Filed Weights   08/14/17 1907  Weight: 104.3 kg (230 lb)    History of present illness:  Per Dr. Oley Balm is a 71 y.o. female with medical history significant of lung cancer, copd, anxiety came in with sob and cough for several days.  Pt is on 3 liters [place] at home already.  She was given nebs and prednisone and has improved but when she ambulates her sats go down to 78% on 2 liters.  Denied fevers.  No chest pain.  No le edema or swelling.  Referred for admission for copde.     Hospital Course:  #1 acute on chronic respiratory failure with hypoxia secondary to acute COPD exacerbation and bronchiectasis in the setting of radiation pneumonitis and underlying lung cancer Patient admitted with hypoxia and to be acute on chronic respiratory failure felt to be secondary to an acute COPD exacerbation with bronchiectasis.  Patient was placed on Mucinex, IV steroid taper, Pulmicort, Claritin, Pepcid, scheduled nebs, antitussives, Flonase, anoro.  Patient improved clinically and oxygen requirements were back to baseline by day of discharge.  Patient will be discharged home  in stable and improved condition.  Outpatient follow-up with pulmonary and PCP.   2.  Hypertension Patient maintained on Norvasc and Cozaar for most of the hospitalization.  HCTZ was subsequently resumed.  Outpatient follow-up.   3.  Gastroesophageal reflux disease Patient maintained on Pepcid and GI cocktail as needed.  4.  Anxiety Atarax as needed.  5.  Chronic diastolic heart failure Compensated.  Patient was maintained on home regimen Cozaar and beta-blocker was held during the hospitalization which will be resumed on discharge.  Outpatient follow-up.   6.  Squamous cell carcinoma of the lung, right Per oncology.  Outpatient follow-up.  7.  Morbid obesity BMI is 36.    Patient was maintained on a low calorie diet and weight loss recommended.  #8 constipation Placed on MiraLAX 17 g twice daily, Senokot-S nightly.    Patient will be discharged on a bowel regimen.  Outpatient follow-up.       Procedures:  CXR 08/14/2017      Consultations:  Oncology: Dr. Marin Olp 08/15/2017      Discharge Exam: Vitals:   08/18/17 1414 08/18/17 1458  BP: (!) 165/85   Pulse: 94   Resp: 18   Temp: 98.1 F (36.7 C)   SpO2: 94% 98%    General: NAD Cardiovascular: RRR Respiratory: Minimal expiratory wheezes.  Discharge Instructions   Discharge Instructions    Diet - low sodium heart healthy   Complete by:  As directed    Increase activity slowly   Complete by:  As directed      Current Discharge Medication List  START taking these medications   Details  azithromycin (ZITHROMAX) 250 MG tablet Take 1 tablet (250 mg total) daily for 2 days by mouth. Qty: 2 each, Refills: 0    fluticasone (FLONASE) 50 MCG/ACT nasal spray Place 2 sprays daily into both nostrils. Qty: 16 g, Refills: 0    guaiFENesin (MUCINEX) 600 MG 12 hr tablet Take 2 tablets (1,200 mg total) 2 (two) times daily for 3 days by mouth. Qty: 12 tablet, Refills: 0    HYDROcodone-homatropine  (HYCODAN) 5-1.5 MG/5ML syrup Take 5 mLs every 6 (six) hours as needed by mouth for cough. Qty: 120 mL, Refills: 0    polyethylene glycol (MIRALAX / GLYCOLAX) packet Take 17 g 2 (two) times daily by mouth. Qty: 14 each, Refills: 0    predniSONE (DELTASONE) 20 MG tablet Take 1-3 tablets (20-60 mg total) daily before breakfast by mouth. Take 3 tablets (60mg ) daily x 2 day, then 2 tablets (40mg ) daily x 3 days, then 1 tablet (20mg ) daily x 3 days then stop. Qty: 15 tablet, Refills: 0    senna-docusate (SENOKOT-S) 8.6-50 MG tablet Take 1 tablet at bedtime by mouth.      CONTINUE these medications which have NOT CHANGED   Details  albuterol (PROVENTIL HFA;VENTOLIN HFA) 108 (90 Base) MCG/ACT inhaler Inhale 2 puffs into the lungs every 6 (six) hours as needed for wheezing or shortness of breath. Qty: 1 Inhaler, Refills: 1    amLODipine (NORVASC) 10 MG tablet Take 1 tablet (10 mg total) daily by mouth. Qty: 10 tablet, Refills: 0    Cholecalciferol (VITAMIN D PO) Take 5,000 Units by mouth daily.     diphenhydrAMINE (BENADRYL) 25 mg capsule Take 25 mg by mouth every 4 (four) hours as needed for itching or allergies.    losartan-hydrochlorothiazide (HYZAAR) 100-25 MG tablet Take 1 tablet daily by mouth. Qty: 90 tablet, Refills: 1    metoprolol succinate (TOPROL-XL) 100 MG 24 hr tablet Take 1.5 tablets (150 mg) daily with or immediately following a meal. Qty: 15 tablet, Refills: 0    umeclidinium-vilanterol (ANORO ELLIPTA) 62.5-25 MCG/INH AEPB Inhale 1 puff into the lungs daily. Qty: 60 each, Refills: 3    ADVAIR DISKUS 250-50 MCG/DOSE AEPB Inhale 1 puff into the lungs daily.    escitalopram (LEXAPRO) 10 MG tablet Take 10 mg by mouth daily.    hydrOXYzine (ATARAX/VISTARIL) 25 MG tablet Take 25 mg by mouth 3 (three) times daily as needed for anxiety.    loratadine (CLARITIN) 10 MG tablet Take 1 tablet (10 mg total) by mouth daily. Qty: 30 tablet, Refills: 11    Spacer/Aero-Holding  Chambers (AEROCHAMBER MV) inhaler Use as instructed Qty: 1 each, Refills: 0      STOP taking these medications     potassium chloride SA (K-DUR,KLOR-CON) 20 MEQ tablet        Allergies  Allergen Reactions  . Iohexol Rash    Pt has had two previous ct scans with a reaction: hives.  . Ciprofloxacin Rash    Rash possibly caused by Cipro?  . Flagyl [Metronidazole] Rash    Rash possibly caused by Flagyl?  Marland Kitchen Zosyn [Piperacillin Sod-Tazobactam So] Itching and Rash    Has patient had a PCN reaction causing immediate rash, facial/tongue/throat swelling, SOB or lightheadedness with hypotension: No Has patient had a PCN reaction causing severe rash involving mucus membranes or skin necrosis: No Has patient had a PCN reaction that required hospitalization: No Has patient had a PCN reaction occurring within the last 10  years: Yes If all of the above answers are "NO", then may proceed with Cephalosporin use.    Follow-up Information    [name], MD. Schedule an appointment as soon as possible for a visit in 2 week(s).   Specialty:  Family Medicine Contact information: [name] [address] [phone]        Javier Glazier, MD Follow up in 2 week(s).   Specialty:  Pulmonary Disease Why:  f/u in 1-2 weeks. Contact information: [address] [place] [address] [phone]            The results of significant diagnostics from this hospitalization (including imaging, microbiology, ancillary and laboratory) are listed below for reference.    Significant Diagnostic Studies: Dg Chest 2 View  Result Date: 08/14/2017 CLINICAL DATA:  71 year old female with shortness of breath. EXAM: CHEST  2 VIEW COMPARISON:  Chest CT dated 08/07/2017 FINDINGS: An area of opacity in the right mid lung field and right hilar region corresponds to the radiation changes seen on the prior CT. Left upper lobe streaky densities also seen on the prior CT. The  previously seen nodule/ neoplasm in the left upper lobe is not well visualized on this radiograph. No new consolidative changes. There is no pleural effusion or pneumothorax. There is right pleural thickening. There is cardiomegaly with coronary vascular calcification. There is atherosclerotic calcification of the aortic arch. No acute osseous pathology. IMPRESSION: 1. No acute cardiopulmonary process. 2. 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 3. Cardiomegaly. Electronically Signed   By: Anner Crete M.D.   On: 08/14/2017 21:19   Ct Chest Wo Contrast  Result Date: 08/07/2017 CLINICAL DATA:  Non-small cell lung cancer of the left upper lobe, status post radiation EXAM: CT CHEST WITHOUT CONTRAST TECHNIQUE: Multidetector CT imaging of the chest was performed following the standard protocol without IV contrast. COMPARISON:  PET-CT dated 02/05/2017 FINDINGS: Cardiovascular: Heart is top-normal in size. No pericardial effusion. No evidence of thoracic aortic aneurysm. Atherosclerotic calcifications of the aortic arch. Mild three-vessel coronary atherosclerosis. Mediastinum/Nodes: 12 mm short axis low right paratracheal node (series 2/ image 50). Additional smaller prevascular, paratracheal, and subcarinal nodes. Visualized thyroid is unremarkable. Lungs/Pleura: Radiation changes in the right lung/perihilar region. Right lower lobe predominant bronchiectasis. Volume loss in the right hemithorax. Additional patchy opacity in the anterior left upper lobe, likely reflecting radiation changes. Underlying 11 x 11 mm medial left upper lobe pulmonary nodule (series 7/image 50), previously 13 x 19 mm. Centrilobular and paraseptal emphysematous changes, upper lobe predominant. No focal consolidation. Trace right pleural fluid.  No pneumothorax. Upper Abdomen: Visualized upper abdomen is unremarkable. Musculoskeletal: Mild degenerative changes of the visualized thoracolumbar spine.  IMPRESSION: 11 mm medial left upper lobe pulmonary nodule, corresponding to known primary bronchogenic neoplasm, decreased. Radiation changes in the left upper lobe and right lung. Mildly prominent mediastinal lymph nodes, including a dominant 12 mm short axis low right paratracheal node, indeterminate. Aortic Atherosclerosis (ICD10-I70.0) and Emphysema (ICD10-J43.9). Electronically Signed   By: Julian Hy M.D.   On: 08/07/2017 17:15    Microbiology: No results found for this or any previous visit (from the past 240 hour(s)).   Labs: Basic Metabolic Panel: Recent Labs  Lab 08/14/17 1949 08/16/17 0410 08/17/17 0418 08/18/17 0337  NA 136 134* 134* 133*  K 3.8 4.5 5.1 4.5  CL 95* 94* 96* 92*  CO2 33* 33* 32 33*  GLUCOSE 131* 148* 138*  173*  BUN 19 28* 32* 33*  CREATININE 0.88 0.90 0.85 0.89  CALCIUM 8.8* 9.1 8.9 8.7*   Liver Function Tests: Recent Labs  Lab 08/14/17 1949 08/16/17 0410  AST 14* 23  ALT 10* 19  ALKPHOS 87 89  BILITOT 0.7 0.6  PROT 7.0 7.5  ALBUMIN 3.3* 3.4*   No results for input(s): LIPASE, AMYLASE in the last 168 hours. No results for input(s): AMMONIA in the last 168 hours. CBC: Recent Labs  Lab 08/14/17 1949 08/16/17 0410 08/17/17 0418 08/18/17 0337  WBC 13.1* 13.6* 13.7* 13.4*  NEUTROABS 11.2* 12.9*  --   --   HGB 10.8* 10.6* 10.1* 10.4*  HCT 33.8* 33.4* 31.9* 32.4*  MCV 91.6 91.8 91.4 91.8  PLT 266 277 276 311   Cardiac Enzymes: No results for input(s): CKTOTAL, CKMB, CKMBINDEX, TROPONINI in the last 168 hours. BNP: BNP (last 3 results) Recent Labs    12/30/16 1209 12/31/16 0103 08/14/17 1949  BNP 626.5* 1,330.1* 146.0*    ProBNP (last 3 results) No results for input(s): PROBNP in the last 8760 hours.  CBG: No results for input(s): GLUCAP in the last 168 hours.     Signed:  Irine Seal MD.  [facility] 08/18/2017, 4:51 PM

## 2017-09-06 ENCOUNTER — Other Ambulatory Visit (HOSPITAL_BASED_OUTPATIENT_CLINIC_OR_DEPARTMENT_OTHER): Payer: Medicare Other

## 2017-09-06 ENCOUNTER — Other Ambulatory Visit: Payer: Self-pay

## 2017-09-06 ENCOUNTER — Encounter: Payer: Self-pay | Admitting: Hematology & Oncology

## 2017-09-06 ENCOUNTER — Ambulatory Visit (HOSPITAL_BASED_OUTPATIENT_CLINIC_OR_DEPARTMENT_OTHER): Payer: Medicare Other | Admitting: Hematology & Oncology

## 2017-09-06 VITALS — BP 177/73 | HR 115 | Temp 98.6°F | Resp 18 | Wt 250.0 lb

## 2017-09-06 DIAGNOSIS — D649 Anemia, unspecified: Secondary | ICD-10-CM | POA: Diagnosis present

## 2017-09-06 DIAGNOSIS — C3490 Malignant neoplasm of unspecified part of unspecified bronchus or lung: Secondary | ICD-10-CM | POA: Diagnosis present

## 2017-09-06 DIAGNOSIS — C3491 Malignant neoplasm of unspecified part of right bronchus or lung: Secondary | ICD-10-CM

## 2017-09-06 DIAGNOSIS — D500 Iron deficiency anemia secondary to blood loss (chronic): Secondary | ICD-10-CM

## 2017-09-06 DIAGNOSIS — C3412 Malignant neoplasm of upper lobe, left bronchus or lung: Secondary | ICD-10-CM

## 2017-09-06 LAB — CMP (CANCER CENTER ONLY)
ALK PHOS: 90 U/L — AB (ref 26–84)
ALT(SGPT): 17 U/L (ref 10–47)
AST: 14 U/L (ref 11–38)
Albumin: 3.1 g/dL — ABNORMAL LOW (ref 3.3–5.5)
BILIRUBIN TOTAL: 0.6 mg/dL (ref 0.20–1.60)
BUN, Bld: 19 mg/dL (ref 7–22)
CHLORIDE: 98 meq/L (ref 98–108)
CO2: 30 mEq/L (ref 18–33)
CREATININE: 0.9 mg/dL (ref 0.6–1.2)
Calcium: 9.1 mg/dL (ref 8.0–10.3)
GLUCOSE: 110 mg/dL (ref 73–118)
POTASSIUM: 4 meq/L (ref 3.3–4.7)
Sodium: 140 mEq/L (ref 128–145)
Total Protein: 6.9 g/dL (ref 6.4–8.1)

## 2017-09-06 LAB — CBC WITH DIFFERENTIAL (CANCER CENTER ONLY)
BASO#: 0 10*3/uL (ref 0.0–0.2)
BASO%: 0.4 % (ref 0.0–2.0)
EOS%: 2 % (ref 0.0–7.0)
Eosinophils Absolute: 0.2 10*3/uL (ref 0.0–0.5)
HEMATOCRIT: 33.7 % — AB (ref 34.8–46.6)
HEMOGLOBIN: 10.8 g/dL — AB (ref 11.6–15.9)
LYMPH#: 0.6 10*3/uL — AB (ref 0.9–3.3)
LYMPH%: 6.1 % — ABNORMAL LOW (ref 14.0–48.0)
MCH: 29.6 pg (ref 26.0–34.0)
MCHC: 32 g/dL (ref 32.0–36.0)
MCV: 92 fL (ref 81–101)
MONO#: 0.4 10*3/uL (ref 0.1–0.9)
MONO%: 4.2 % (ref 0.0–13.0)
NEUT%: 87.3 % — ABNORMAL HIGH (ref 39.6–80.0)
NEUTROS ABS: 8.4 10*3/uL — AB (ref 1.5–6.5)
Platelets: 261 10*3/uL (ref 145–400)
RBC: 3.65 10*6/uL — AB (ref 3.70–5.32)
RDW: 14.7 % (ref 11.1–15.7)
WBC: 9.6 10*3/uL (ref 3.9–10.0)

## 2017-09-06 NOTE — Progress Notes (Signed)
Hematology and Oncology Follow Up Visit  Devany Aja [id number] [birth year] 71 y.o. 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 Morais is in for her follow-up.  She comes in with her sister.  I actually saw her in the hospital back in November.  She was admitted with another COPD exacerbation.  She had a CT of the chest while in the hospital.  This showed a 11 mm left upper lobe nodule.  This was consistent with her primary bronchogenic neoplasm.  This is improved in size.  She had a 12 mm right paratracheal lymph node that was indeterminate in nature.  I have to believe that this likely is reactive.  She has oxygen.  She has significant COPD.  She has had no problems with bleeding.  She has had no fever.  She has had no nausea or vomiting.  She has had no diarrhea.  She has had no leg swelling.  There is been no rashes.  She had a very nice Thanksgiving.  She is been eating pretty well.  Overall, I would say performance status is ECOG 2.    Medications:  Current Outpatient Medications:  .  ADVAIR DISKUS 250-50 MCG/DOSE AEPB, Inhale 1 puff into the lungs daily., Disp: , Rfl:  .  albuterol (PROVENTIL HFA;VENTOLIN HFA) 108 (90 Base) MCG/ACT inhaler, Inhale 2 puffs into the lungs every 6 (six) hours as needed for wheezing or shortness of breath., Disp: 1 Inhaler, Rfl: 1 .  amLODipine (NORVASC) 10 MG tablet, Take 1 tablet (10 mg total) daily by mouth., Disp: 10 tablet, Rfl: 0 .  Cholecalciferol (VITAMIN D PO), Take 5,000 Units by mouth daily. , Disp: , Rfl:  .  diphenhydrAMINE (BENADRYL) 25 mg capsule, Take 25 mg by mouth every 4 (four) hours as needed for itching or allergies., Disp: , Rfl:  .  escitalopram (LEXAPRO) 10 MG tablet, Take 10 mg by mouth daily., Disp: , Rfl:  .  fluticasone (FLONASE) 50 MCG/ACT nasal spray, Place 2 sprays  daily into both nostrils., Disp: 16 g, Rfl: 0 .  HYDROcodone-homatropine (HYCODAN) 5-1.5 MG/5ML syrup, Take 5 mLs every 6 (six) hours as needed by mouth for cough., Disp: 120 mL, Rfl: 0 .  hydrOXYzine (ATARAX/VISTARIL) 25 MG tablet, Take 25 mg by mouth 3 (three) times daily as needed for anxiety., Disp: , Rfl:  .  loratadine (CLARITIN) 10 MG tablet, Take 1 tablet (10 mg total) by mouth daily. (Patient taking differently: Take 10 mg daily as needed by mouth for allergies. ), Disp: 30 tablet, Rfl: 11 .  losartan-hydrochlorothiazide (HYZAAR) 100-25 MG tablet, Take 1 tablet daily by mouth., Disp: 90 tablet, Rfl: 1 .  metoprolol succinate (TOPROL-XL) 100 MG 24 hr tablet, Take 1.5 tablets (150 mg) daily with or immediately following a meal., Disp: 15 tablet, Rfl: 0 .  polyethylene glycol (MIRALAX / GLYCOLAX) packet, Take 17 g 2 (two) times daily by mouth., Disp: 14 each, Rfl: 0 .  predniSONE (DELTASONE) 20 MG tablet, Take 1-3 tablets (20-60 mg total) daily before breakfast by mouth. Take 3 tablets (60mg ) daily x 2 day, then 2 tablets (40mg ) daily x 3 days, then 1 tablet (20mg ) daily x 3 days then stop., Disp: 15 tablet, Rfl: 0 .  senna-docusate (SENOKOT-S) 8.6-50 MG tablet, Take 1 tablet at bedtime by mouth., Disp: , Rfl:  .  Spacer/Aero-Holding Chambers (  AEROCHAMBER MV) inhaler, Use as instructed (Patient not taking: Reported on 08/14/2017), Disp: 1 each, Rfl: 0 .  traZODone (DESYREL) 50 MG tablet, , Disp: , Rfl:  .  umeclidinium-vilanterol (ANORO ELLIPTA) 62.5-25 MCG/INH AEPB, Inhale 1 puff into the lungs daily., Disp: 60 each, Rfl: 3  Allergies:  Allergies  Allergen Reactions  . Ciprofloxacin Rash    Rash possibly caused by Cipro?  . Flagyl [Metronidazole] Rash    Rash possibly caused by Flagyl?  Marland Kitchen Zosyn [Piperacillin Sod-Tazobactam So] Itching and Rash    Has patient had a PCN reaction causing immediate rash, facial/tongue/throat swelling, SOB or lightheadedness with hypotension: No Has patient  had a PCN reaction causing severe rash involving mucus membranes or skin necrosis: No Has patient had a PCN reaction that required hospitalization: No Has patient had a PCN reaction occurring within the last 10 years: Yes If all of the above answers are "NO", then may proceed with Cephalosporin use.     Past Medical History, Surgical history, Social history, and Family History were reviewed and updated.  Review of Systems: As stated in the interim history  Physical Exam:  weight is 250 lb (113.4 kg). Her oral temperature is 98.6 F (37 C). Her blood pressure is 177/73 (abnormal) and her pulse is 115 (abnormal). Her respiration is 18 and oxygen saturation is 95%.   Wt Readings from Last 3 Encounters:  09/06/17 250 lb (113.4 kg)  08/14/17 230 lb (104.3 kg)  05/29/17 238 lb 12.8 oz (108.3 kg)     Somewhat obese white female in no obvious distress.  Head neck exam shows no ocular or oral lesions.  She has no palpable cervical or supraclavicular lymph nodes.  Lungs are clear bilaterally.  I hear no wheezes, or rales bilaterally.  Cardiac exam regular rate and rhythm with no murmurs, rubs or bruits.  Abdomen soft.  Abdomen is obese.  She has decent bowel sounds.  There is no fluid wave.  There is no guarding or rebound tenderness.  She has no palpable liver or spleen tip.  Back exam shows no tenderness over the spine, ribs or hips.  Extremities shows no clubbing, cyanosis or edema.  She has good range of motion of her joints.  Skin exam shows no rashes, ecchymoses or petechia.  Neurological exam shows no focal neurological deficits.  Lab Results  Component Value Date   WBC 9.6 09/06/2017   HGB 10.8 (L) 09/06/2017   HCT 33.7 (L) 09/06/2017   MCV 92 09/06/2017   PLT 261 09/06/2017     Chemistry      Component Value Date/Time   NA 140 09/06/2017 1418   NA 137 01/24/2017 1143   K 4.0 09/06/2017 1418   K 4.7 01/24/2017 1143   CL 98 09/06/2017 1418   CO2 30 09/06/2017 1418   CO2 30 (H)  01/24/2017 1143   BUN 19 09/06/2017 1418   BUN 15.4 08/07/2017 1500   CREATININE 0.9 09/06/2017 1418   CREATININE 0.8 08/07/2017 1500      Component Value Date/Time   CALCIUM 9.1 09/06/2017 1418   CALCIUM 9.7 01/24/2017 1143   ALKPHOS 90 (H) 09/06/2017 1418   ALKPHOS 90 01/24/2017 1143   AST 14 09/06/2017 1418   AST 12 01/24/2017 1143   ALT 17 09/06/2017 1418   ALT 10 01/24/2017 1143   BILITOT 0.60 09/06/2017 1418   BILITOT 0.39 01/24/2017 1143         Impression and Plan: Ms. Igoe is a 71 year old  white female. She has severe underlying COPD. Her FEV1 is 1.14.  I am glad that the CT scan did not show anything that was new or growing.  I do think that a PET scan would be quite helpful.  I will see about getting a PET scan set up for her sometime in early February.  I think that the mediastinal lymph node is reactive and not malignant.  At some point, if we do find that she has progressive disease, we may have to consider a biopsy.  With her first biopsy, there was not enough material for any type of genetic studies.  I would like to see her back after she has her PET scan in February.  I am taken her iron studies.  Her hemoglobin is a little bit down.  I want to make sure that she is not iron deficient.     Volanda Napoleon, MD 12/7/20184:49 PM

## 2017-09-10 LAB — IRON AND TIBC
%SAT: 19 % — AB (ref 21–57)
Iron: 55 ug/dL (ref 41–142)
TIBC: 284 ug/dL (ref 236–444)
UIBC: 229 ug/dL (ref 120–384)

## 2017-09-10 LAB — FERRITIN: Ferritin: 253 ng/ml (ref 9–269)

## 2017-09-11 ENCOUNTER — Telehealth: Payer: Self-pay | Admitting: *Deleted

## 2017-09-11 NOTE — Telephone Encounter (Addendum)
Patient is aware of results. She will call back to schedule infusion. Message sent to scheduler  ----- Message from Volanda Napoleon, MD sent at 09/10/2017  3:03 PM EST ----- Call - the iron is low!!  Need 1 dose of feraheme.  Please set this up for this week.  pete

## 2017-09-16 ENCOUNTER — Other Ambulatory Visit: Payer: Self-pay | Admitting: Family

## 2017-09-16 ENCOUNTER — Ambulatory Visit (HOSPITAL_BASED_OUTPATIENT_CLINIC_OR_DEPARTMENT_OTHER): Payer: Medicare Other

## 2017-09-16 VITALS — BP 150/62 | HR 80 | Temp 98.0°F | Resp 20

## 2017-09-16 DIAGNOSIS — D509 Iron deficiency anemia, unspecified: Secondary | ICD-10-CM

## 2017-09-16 DIAGNOSIS — D508 Other iron deficiency anemias: Secondary | ICD-10-CM

## 2017-09-16 MED ORDER — SODIUM CHLORIDE 0.9 % IV SOLN
510.0000 mg | Freq: Once | INTRAVENOUS | Status: AC
  Administered 2017-09-16: 510 mg via INTRAVENOUS
  Filled 2017-09-16: qty 17

## 2017-09-16 MED ORDER — SODIUM CHLORIDE 0.9 % IV SOLN
Freq: Once | INTRAVENOUS | Status: AC
  Administered 2017-09-16: 14:00:00 via INTRAVENOUS

## 2017-09-16 NOTE — Patient Instructions (Signed)

## 2017-09-18 ENCOUNTER — Telehealth: Payer: Self-pay | Admitting: Acute Care

## 2017-09-18 NOTE — Telephone Encounter (Signed)
Pt requesting an order for a cpap to be sent to [facility].  I advised pt that we have never diagnosed her with sleep apnea.  She verified that she's never been dx'ed, but states that she was previously put on one qhs in the hospital and nursing home and that she slept better with one.  I advised pt that she would need to be evaluated for this dx before anything could be ordered for her regarding this.  Pt scheduled to establish with RA for sleep care.  Nothing further needed at this time.

## 2017-10-17 ENCOUNTER — Encounter: Payer: Self-pay | Admitting: Pulmonary Disease

## 2017-10-17 ENCOUNTER — Ambulatory Visit (INDEPENDENT_AMBULATORY_CARE_PROVIDER_SITE_OTHER): Payer: Medicare Other | Admitting: Pulmonary Disease

## 2017-10-17 DIAGNOSIS — J9611 Chronic respiratory failure with hypoxia: Secondary | ICD-10-CM

## 2017-10-17 DIAGNOSIS — G4733 Obstructive sleep apnea (adult) (pediatric): Secondary | ICD-10-CM | POA: Diagnosis not present

## 2017-10-17 DIAGNOSIS — J700 Acute pulmonary manifestations due to radiation: Secondary | ICD-10-CM | POA: Diagnosis not present

## 2017-10-17 MED ORDER — PREDNISONE 10 MG PO TABS: ORAL_TABLET | ORAL | 1 refills | Status: DC

## 2017-10-17 MED ORDER — UMECLIDINIUM-VILANTEROL 62.5-25 MCG/INH IN AEPB: 1.0000 | INHALATION_SPRAY | Freq: Every day | RESPIRATORY_TRACT | 0 refills | Status: DC

## 2017-10-17 NOTE — Progress Notes (Signed)
Subjective:    Patient ID: Alisha Fisher, female    DOB: 1946-05-02, 72 y.o.   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  She was last seen by my partner Dr. Creig Hines in 05/2017. She underwent palliative radiation to left upper lobe cancer and is followed by Dr. Marin Olp. 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  She felt slightly faint on the way machine today and is in a wheelchair, accompanied by her sister [name].  This seems to have limited understanding of lung cancer staging and seem to think that everything is fine.  Last CT chest from 08/2017 was reviewed Oxygen saturation is 90% today on 2 L and she wonders if she can have a portable oxygen concentrator. Review of her last visit shows that she was asked to take trelegy but apparently this was very expensive and she continues on Advair  She feels that CPAP really helped her breathing during her last hospitalization and wonders if she can get a CPAP machine. Epworth sleepiness score is 9 Bedtime is variable, takes 30-60 minutes to fall asleep, reports several awakenings, she sleeps in a recliner, out of bed by 7:30 AM feeling tired with dryness of mouth and occasional headache. She has gained 20 pounds in the last 2 years. She is on continuous oxygen about 2 L/min      Significant tests/ events reviewed  PFT  04/22/12:FVC 1.90 L (67%) FEV1 1.14 L (47%) FEV1/FVC 0.60 FEF 25-75 0.44 L (70%) negative bronchodilator response TLC 4.09 L (75%) RV 100% ERV 30% DLCO uncorrected 63%  6MWT 05/29/17:  Baseline Sat 83% on room air - Saturation improved to 94% on 2 L/m but patient only able to walk from chair to door.  Past Medical  History:  Diagnosis Date  . Cancer ([place])   . COPD, severe ([place])   . HTN (hypertension)   . Pneumonia   . Pulmonary emphysema ([place])   . SOB (shortness of breath)     Past Surgical History:  Procedure Laterality Date  . CATARACT EXTRACTION    . ENDOBRONCHIAL ULTRASOUND Bilateral 12/17/2016   Procedure: ENDOBRONCHIAL ULTRASOUND;  Surgeon: Javier Glazier, MD;  Location: [facility];  Service: Cardiopulmonary;  Laterality: Bilateral;  . IR GENERIC HISTORICAL  12/21/2016   IR SINUS/FIST TUBE CHK-NON GI 12/21/2016 [facility]  . IR GENERIC HISTORICAL  12/21/2016   IR US GUIDE BX ASP/DRAIN 12/21/2016 Corrie Mckusick, DO [facility]  . IR RADIOLOGIST EVAL & MGMT  01/17/2017      Allergies  Allergen Reactions  . Ciprofloxacin Rash    Rash possibly caused by Cipro?  . Flagyl [Metronidazole] Rash    Rash possibly caused by Flagyl?  Marland Kitchen Zosyn [Piperacillin Sod-Tazobactam So] Itching and Rash    Has patient had a PCN reaction causing immediate rash, facial/tongue/throat swelling, SOB or lightheadedness with hypotension: No Has patient had a PCN reaction causing severe rash involving mucus membranes or skin necrosis: No Has patient had a PCN reaction that required hospitalization: No Has patient had a PCN reaction occurring within the last 10 years: Yes If all of the above answers are "NO", then may proceed with Cephalosporin use.      Social  History   Socioeconomic History  . Marital status: Widowed    Spouse name: Not on file  . Number of children: Not on file  . Years of education: Not on file  . Highest education level: Not on file  Social Needs  . Financial resource strain: Not on file  . Food insecurity - worry: Not on file  . Food insecurity - inability: Not on file  . Transportation needs - medical: Not on file  . Transportation needs - non-medical: Not on file  Occupational History  . Occupation: retired  Tobacco Use  . Smoking status: Former Smoker    Packs/day: 0.50     Years: 50.00    Pack years: 25.00    Types: Cigarettes    Last attempt to quit: 11/01/2016    Years since quitting: 0.9  . Smokeless tobacco: Never Used  Substance and Sexual Activity  . Alcohol use: No  . Drug use: No  . Sexual activity: Not on file  Other Topics Concern  . Not on file  Social History Narrative   [facility] (12/10/16):   Patient's widower son and grandchildren moved in with her. Her husband passed years ago. Previously worked in Press photographer.     Family History  Problem Relation Age of Onset  . Allergies Unknown   . COPD Cousin   . Breast cancer Mother   . Diabetes Father   . Diabetes Son      Review of Systems  Constitutional: Negative for fever and unexpected weight change.  HENT: Positive for congestion and sinus pressure. Negative for dental problem, ear pain, nosebleeds, postnasal drip, rhinorrhea, sneezing, sore throat and trouble swallowing.   Eyes: Negative for redness and itching.  Respiratory: Positive for cough and shortness of breath. Negative for chest tightness and wheezing.   Cardiovascular: Negative for palpitations and leg swelling.  Gastrointestinal: Negative for nausea and vomiting.  Genitourinary: Negative for dysuria.  Musculoskeletal: Negative for joint swelling.  Skin: Negative for rash.  Neurological: Positive for headaches.  Hematological: Does not bruise/bleed easily.  Psychiatric/Behavioral: Negative for dysphoric mood. The patient is nervous/anxious.        Objective:   Physical Exam  Gen. Pleasant, obese, in no distress, anxious affect she is, and wheelchair ENT - no lesions, no post nasal drip, class 2-3 airway Neck: No JVD, no thyromegaly, no carotid bruits Lungs: no use of accessory muscles, no dullness to percussion, decreased breath sounds on right without rales or rhonchi  Cardiovascular: Rhythm regular, heart sounds  normal, no murmurs or gallops, no peripheral edema Abdomen: soft and non-tender, no  hepatosplenomegaly, BS normal. Musculoskeletal: No deformities, no cyanosis or clubbing Neuro:  alert, non focal, no tremors       Assessment & Plan:

## 2017-10-17 NOTE — Patient Instructions (Signed)
Prednisone 10 mg tabs  Take 2 tabs daily with food x 2 weeks , then 1 tab daily with food until next appt  Sample of ANORO instead of Advair. If this works call us for prescription.  We will request another DME to evaluate you for portable oxygen concentrator.  Schedule split night study

## 2017-10-17 NOTE — Assessment & Plan Note (Signed)
We will request another DME to evaluate you for portable oxygen concentrator.

## 2017-10-17 NOTE — Assessment & Plan Note (Signed)
Sample of ANORO instead of Advair. If this works call us for prescription.  Continue albuterol nebs as needed

## 2017-10-17 NOTE — Assessment & Plan Note (Signed)
Given excessive daytime somnolence, narrow pharyngeal exam, witnessed apneas & loud snoring, obstructive sleep apnea is very likely & an overnight polysomnogram will be scheduled as a split study. The pathophysiology of obstructive sleep apnea , it's cardiovascular consequences & modes of treatment including CPAP were discused with the patient in detail & they evidenced understanding.  Since she is on oxygen, will need attended PSG

## 2017-10-17 NOTE — Assessment & Plan Note (Signed)
Prednisone 10 mg tabs  Take 2 tabs daily with food x 2 weeks , then 1 tab daily with food until next appt  We will treat with a long course of prednisone, her acute worsening and hypoxia may be related to radiation pneumonitis. "She always does well with prednisone"

## 2017-11-05 ENCOUNTER — Encounter (HOSPITAL_COMMUNITY): Payer: Self-pay

## 2017-11-05 ENCOUNTER — Ambulatory Visit (HOSPITAL_COMMUNITY)
Admission: RE | Admit: 2017-11-05 | Discharge: 2017-11-05 | Disposition: A | Discharge status: Home / Self Care | Payer: Medicare Other | Source: Ambulatory Visit | Attending: Hematology & Oncology | Admitting: Hematology & Oncology

## 2017-11-05 ENCOUNTER — Inpatient Hospital Stay: Payer: Medicare Other | Attending: Hematology & Oncology

## 2017-11-05 DIAGNOSIS — M47896 Other spondylosis, lumbar region: Secondary | ICD-10-CM | POA: Insufficient documentation

## 2017-11-05 DIAGNOSIS — R197 Diarrhea, unspecified: Secondary | ICD-10-CM | POA: Diagnosis not present

## 2017-11-05 DIAGNOSIS — I2510 Atherosclerotic heart disease of native coronary artery without angina pectoris: Secondary | ICD-10-CM | POA: Insufficient documentation

## 2017-11-05 DIAGNOSIS — J449 Chronic obstructive pulmonary disease, unspecified: Secondary | ICD-10-CM | POA: Diagnosis not present

## 2017-11-05 DIAGNOSIS — M549 Dorsalgia, unspecified: Secondary | ICD-10-CM | POA: Diagnosis not present

## 2017-11-05 DIAGNOSIS — Z880 Allergy status to penicillin: Secondary | ICD-10-CM | POA: Insufficient documentation

## 2017-11-05 DIAGNOSIS — J439 Emphysema, unspecified: Secondary | ICD-10-CM | POA: Diagnosis not present

## 2017-11-05 DIAGNOSIS — R12 Heartburn: Secondary | ICD-10-CM | POA: Diagnosis not present

## 2017-11-05 DIAGNOSIS — R5381 Other malaise: Secondary | ICD-10-CM | POA: Insufficient documentation

## 2017-11-05 DIAGNOSIS — Z923 Personal history of irradiation: Secondary | ICD-10-CM | POA: Insufficient documentation

## 2017-11-05 DIAGNOSIS — C3412 Malignant neoplasm of upper lobe, left bronchus or lung: Secondary | ICD-10-CM

## 2017-11-05 DIAGNOSIS — C771 Secondary and unspecified malignant neoplasm of intrathoracic lymph nodes: Secondary | ICD-10-CM | POA: Insufficient documentation

## 2017-11-05 DIAGNOSIS — Z9981 Dependence on supplemental oxygen: Secondary | ICD-10-CM | POA: Insufficient documentation

## 2017-11-05 DIAGNOSIS — K8020 Calculus of gallbladder without cholecystitis without obstruction: Secondary | ICD-10-CM | POA: Diagnosis not present

## 2017-11-05 DIAGNOSIS — R5383 Other fatigue: Secondary | ICD-10-CM | POA: Diagnosis not present

## 2017-11-05 DIAGNOSIS — C7801 Secondary malignant neoplasm of right lung: Secondary | ICD-10-CM | POA: Diagnosis not present

## 2017-11-05 DIAGNOSIS — Z7952 Long term (current) use of systemic steroids: Secondary | ICD-10-CM | POA: Diagnosis not present

## 2017-11-05 DIAGNOSIS — R05 Cough: Secondary | ICD-10-CM | POA: Diagnosis not present

## 2017-11-05 DIAGNOSIS — N261 Atrophy of kidney (terminal): Secondary | ICD-10-CM | POA: Diagnosis not present

## 2017-11-05 DIAGNOSIS — D500 Iron deficiency anemia secondary to blood loss (chronic): Secondary | ICD-10-CM

## 2017-11-05 DIAGNOSIS — J90 Pleural effusion, not elsewhere classified: Secondary | ICD-10-CM | POA: Diagnosis not present

## 2017-11-05 DIAGNOSIS — Z881 Allergy status to other antibiotic agents status: Secondary | ICD-10-CM | POA: Diagnosis not present

## 2017-11-05 DIAGNOSIS — Z79899 Other long term (current) drug therapy: Secondary | ICD-10-CM | POA: Diagnosis not present

## 2017-11-05 DIAGNOSIS — I700 Atherosclerosis of aorta: Secondary | ICD-10-CM | POA: Diagnosis not present

## 2017-11-05 DIAGNOSIS — M5136 Other intervertebral disc degeneration, lumbar region: Secondary | ICD-10-CM | POA: Diagnosis not present

## 2017-11-05 LAB — IRON AND TIBC
Iron: 41 ug/dL (ref 41–142)
Saturation Ratios: 15 % — ABNORMAL LOW (ref 21–57)
TIBC: 275 ug/dL (ref 236–444)
UIBC: 234 ug/dL

## 2017-11-05 LAB — CBC WITH DIFFERENTIAL (CANCER CENTER ONLY)
BASOS PCT: 1 %
Basophils Absolute: 0.1 10*3/uL (ref 0.0–0.1)
EOS ABS: 0.1 10*3/uL (ref 0.0–0.5)
EOS PCT: 1 %
HCT: 36.3 % (ref 34.8–46.6)
HEMOGLOBIN: 12 g/dL (ref 11.6–15.9)
LYMPHS ABS: 0.9 10*3/uL (ref 0.9–3.3)
Lymphocytes Relative: 7 %
MCH: 29.5 pg (ref 25.1–34.0)
MCHC: 33.1 g/dL (ref 31.5–36.0)
MCV: 89 fL (ref 79.5–101.0)
MONOS PCT: 5 %
Monocytes Absolute: 0.6 10*3/uL (ref 0.1–0.9)
NEUTROS PCT: 86 %
Neutro Abs: 11.9 10*3/uL — ABNORMAL HIGH (ref 1.5–6.5)
PLATELETS: 327 10*3/uL (ref 145–400)
RBC: 4.07 MIL/uL (ref 3.70–5.45)
RDW: 15.3 % — AB (ref 11.2–14.5)
WBC Count: 13.7 10*3/uL — ABNORMAL HIGH (ref 3.9–10.3)

## 2017-11-05 LAB — CMP (CANCER CENTER ONLY)
ALT: 12 U/L (ref 0–55)
AST: 10 U/L (ref 5–34)
Albumin: 3.5 g/dL (ref 3.5–5.0)
Alkaline Phosphatase: 95 U/L (ref 40–150)
Anion gap: 9 (ref 3–11)
BUN: 26 mg/dL (ref 7–26)
CO2: 30 mmol/L — ABNORMAL HIGH (ref 22–29)
Calcium: 9.2 mg/dL (ref 8.4–10.4)
Chloride: 99 mmol/L (ref 98–109)
Creatinine: 0.86 mg/dL (ref 0.60–1.10)
GFR, Est AFR Am: >60 mL/min
GFR, Estimated: >60 mL/min
Glucose, Bld: 81 mg/dL (ref 70–140)
Potassium: 4.1 mmol/L (ref 3.5–5.1)
Sodium: 138 mmol/L (ref 136–145)
Total Bilirubin: 0.6 mg/dL (ref 0.2–1.2)
Total Protein: 7.2 g/dL (ref 6.4–8.3)

## 2017-11-05 LAB — FERRITIN: Ferritin: 229 ng/mL (ref 9–269)

## 2017-11-05 LAB — GLUCOSE, CAPILLARY: Glucose-Capillary: 90 mg/dL (ref 65–99)

## 2017-11-05 LAB — LACTATE DEHYDROGENASE: LDH: 187 U/L (ref 125–245)

## 2017-11-05 MED ORDER — FLUDEOXYGLUCOSE F - 18 (FDG) INJECTION
11.5400 | Freq: Once | INTRAVENOUS | Status: AC
  Administered 2017-11-05: 11.54 via INTRAVENOUS

## 2017-11-06 ENCOUNTER — Ambulatory Visit: Payer: Medicare Other | Admitting: Hematology & Oncology

## 2017-11-06 ENCOUNTER — Other Ambulatory Visit: Payer: Medicare Other

## 2017-11-06 LAB — ERYTHROPOIETIN: ERYTHROPOIETIN: 10.2 m[IU]/mL (ref 2.6–18.5)

## 2017-11-07 ENCOUNTER — Encounter (HOSPITAL_BASED_OUTPATIENT_CLINIC_OR_DEPARTMENT_OTHER): Payer: Medicare Other

## 2017-11-07 ENCOUNTER — Encounter: Payer: Self-pay | Admitting: Hematology & Oncology

## 2017-11-07 ENCOUNTER — Other Ambulatory Visit: Payer: Self-pay | Admitting: Hematology & Oncology

## 2017-11-07 DIAGNOSIS — D500 Iron deficiency anemia secondary to blood loss (chronic): Secondary | ICD-10-CM

## 2017-11-07 DIAGNOSIS — D509 Iron deficiency anemia, unspecified: Secondary | ICD-10-CM | POA: Insufficient documentation

## 2017-11-07 HISTORY — DX: Iron deficiency anemia, unspecified: D50.9

## 2017-11-08 ENCOUNTER — Encounter: Payer: Self-pay | Admitting: Medical

## 2017-11-11 ENCOUNTER — Other Ambulatory Visit: Payer: Self-pay

## 2017-11-11 ENCOUNTER — Inpatient Hospital Stay (HOSPITAL_BASED_OUTPATIENT_CLINIC_OR_DEPARTMENT_OTHER): Payer: Medicare Other | Admitting: Hematology & Oncology

## 2017-11-11 ENCOUNTER — Encounter: Payer: Self-pay | Admitting: Hematology & Oncology

## 2017-11-11 ENCOUNTER — Inpatient Hospital Stay: Payer: Medicare Other

## 2017-11-11 VITALS — BP 153/68 | HR 99 | Temp 98.2°F | Resp 20

## 2017-11-11 DIAGNOSIS — Z881 Allergy status to other antibiotic agents status: Secondary | ICD-10-CM

## 2017-11-11 DIAGNOSIS — R5383 Other fatigue: Secondary | ICD-10-CM | POA: Diagnosis not present

## 2017-11-11 DIAGNOSIS — R05 Cough: Secondary | ICD-10-CM | POA: Diagnosis not present

## 2017-11-11 DIAGNOSIS — R12 Heartburn: Secondary | ICD-10-CM | POA: Diagnosis not present

## 2017-11-11 DIAGNOSIS — J90 Pleural effusion, not elsewhere classified: Secondary | ICD-10-CM | POA: Diagnosis not present

## 2017-11-11 DIAGNOSIS — R5381 Other malaise: Secondary | ICD-10-CM | POA: Diagnosis not present

## 2017-11-11 DIAGNOSIS — C771 Secondary and unspecified malignant neoplasm of intrathoracic lymph nodes: Secondary | ICD-10-CM

## 2017-11-11 DIAGNOSIS — Z7952 Long term (current) use of systemic steroids: Secondary | ICD-10-CM

## 2017-11-11 DIAGNOSIS — C3412 Malignant neoplasm of upper lobe, left bronchus or lung: Secondary | ICD-10-CM | POA: Diagnosis not present

## 2017-11-11 DIAGNOSIS — D508 Other iron deficiency anemias: Secondary | ICD-10-CM

## 2017-11-11 DIAGNOSIS — Z9981 Dependence on supplemental oxygen: Secondary | ICD-10-CM | POA: Diagnosis not present

## 2017-11-11 DIAGNOSIS — R197 Diarrhea, unspecified: Secondary | ICD-10-CM

## 2017-11-11 DIAGNOSIS — M549 Dorsalgia, unspecified: Secondary | ICD-10-CM

## 2017-11-11 DIAGNOSIS — Z880 Allergy status to penicillin: Secondary | ICD-10-CM

## 2017-11-11 DIAGNOSIS — C7801 Secondary malignant neoplasm of right lung: Secondary | ICD-10-CM

## 2017-11-11 DIAGNOSIS — J449 Chronic obstructive pulmonary disease, unspecified: Secondary | ICD-10-CM | POA: Diagnosis not present

## 2017-11-11 DIAGNOSIS — Z923 Personal history of irradiation: Secondary | ICD-10-CM

## 2017-11-11 DIAGNOSIS — Z79899 Other long term (current) drug therapy: Secondary | ICD-10-CM

## 2017-11-11 DIAGNOSIS — C3491 Malignant neoplasm of unspecified part of right bronchus or lung: Secondary | ICD-10-CM

## 2017-11-11 MED ORDER — SODIUM CHLORIDE 0.9 % IV SOLN
510.0000 mg | Freq: Once | INTRAVENOUS | Status: AC
  Administered 2017-11-11: 510 mg via INTRAVENOUS
  Filled 2017-11-11: qty 17

## 2017-11-11 MED ORDER — SODIUM CHLORIDE 0.9 % IV SOLN
Freq: Once | INTRAVENOUS | Status: AC
  Administered 2017-11-11: 15:00:00 via INTRAVENOUS

## 2017-11-11 NOTE — Patient Instructions (Signed)

## 2017-11-11 NOTE — Progress Notes (Signed)
Hematology and Oncology Follow Up Visit  Alisha Fisher [id number] September 14, 1946 72 y.o. 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  She comes in with her sister.  Unfortunately, it looks like we have progressive disease.  We did do a PET scan on her just as a routine follow-up.  The PET scan, unfortunately, showed that there was new disease.  She had a 3.2 cm right infrahilar mass.  The SUV was 13.4.  There was a pleural effusion with an SUV of 7.1.  She had a paraesophageal/subcarinal lymph node with an SUV of 6.3.  She comes in in a wheelchair.  She is oxygen dependent.  She had a decent Christmas.  She has not had any issues with pain.  She has had some chronic shortness of breath.  She has had a cough.  This is chronic.  There is no hemoptysis.  She has had no bleeding.  She is had no leg swelling.  Overall, her performance status is ECOG 2.     Medications:  Current Outpatient Medications:  .  ADVAIR DISKUS 250-50 MCG/DOSE AEPB, Inhale 1 puff into the lungs daily., Disp: , Rfl:  .  albuterol (PROVENTIL HFA;VENTOLIN HFA) 108 (90 Base) MCG/ACT inhaler, Inhale 2 puffs into the lungs every 6 (six) hours as needed for wheezing or shortness of breath., Disp: 1 Inhaler, Rfl: 1 .  amLODipine (NORVASC) 10 MG tablet, Take 1 tablet (10 mg total) daily by mouth., Disp: 10 tablet, Rfl: 0 .  Cholecalciferol (VITAMIN D PO), Take 5,000 Units by mouth daily. , Disp: , Rfl:  .  diphenhydrAMINE (BENADRYL) 25 mg capsule, Take 25 mg by mouth every 4 (four) hours as needed for itching or allergies., Disp: , Rfl:  .  fluticasone (FLONASE) 50 MCG/ACT nasal spray, Place 2 sprays daily into both nostrils., Disp: 16 g, Rfl: 0 .  hydrOXYzine (ATARAX/VISTARIL) 25 MG tablet, Take 25 mg by mouth 3 (three) times daily as  needed for anxiety., Disp: , Rfl:  .  loratadine (CLARITIN) 10 MG tablet, Take 1 tablet (10 mg total) by mouth daily. (Patient taking differently: Take 10 mg daily as needed by mouth for allergies. ), Disp: 30 tablet, Rfl: 11 .  losartan-hydrochlorothiazide (HYZAAR) 100-25 MG tablet, Take 1 tablet daily by mouth., Disp: 90 tablet, Rfl: 1 .  metoprolol succinate (TOPROL-XL) 100 MG 24 hr tablet, Take 1.5 tablets (150 mg) daily with or immediately following a meal., Disp: 15 tablet, Rfl: 0 .  predniSONE (DELTASONE) 10 MG tablet, 2 daily with food x 2 wks, then 1 daily with food until next appt, Disp: 90 tablet, Rfl: 1 .  senna-docusate (SENOKOT-S) 8.6-50 MG tablet, Take 1 tablet at bedtime by mouth., Disp: , Rfl:  .  Spacer/Aero-Holding Chambers (AEROCHAMBER MV) inhaler, Use as instructed, Disp: 1 each, Rfl: 0 .  traZODone (DESYREL) 50 MG tablet, , Disp: , Rfl:  .  umeclidinium-vilanterol (ANORO ELLIPTA) 62.5-25 MCG/INH AEPB, Inhale 1 puff into the lungs daily., Disp: 30 each, Rfl: 0  Allergies:  Allergies  Allergen Reactions  . Ciprofloxacin Rash    Rash possibly caused by Cipro?  . Flagyl [Metronidazole] Rash    Rash possibly caused by Flagyl?  Marland Kitchen Zosyn [Piperacillin Sod-Tazobactam So] Itching and Rash    Has patient had a PCN reaction causing immediate rash, facial/tongue/throat  swelling, SOB or lightheadedness with hypotension: No Has patient had a PCN reaction causing severe rash involving mucus membranes or skin necrosis: No Has patient had a PCN reaction that required hospitalization: No Has patient had a PCN reaction occurring within the last 10 years: Yes If all of the above answers are "NO", then may proceed with Cephalosporin use.     Past Medical History, Surgical history, Social history, and Family History were reviewed and updated.  Review of Systems: Review of Systems  Constitutional: Positive for malaise/fatigue.  HENT: Positive for congestion.   Eyes: Negative.     Respiratory: Positive for cough, shortness of breath and wheezing.   Cardiovascular: Positive for palpitations.  Gastrointestinal: Positive for diarrhea and heartburn.  Genitourinary: Negative.   Musculoskeletal: Positive for back pain and joint pain.  Skin: Negative.   Neurological: Positive for headaches.  Endo/Heme/Allergies: Negative.   Psychiatric/Behavioral: Negative.      Physical Exam:  oral temperature is 98.2 F (36.8 C). Her blood pressure is 153/68 (abnormal) and her pulse is 99. Her respiration is 20 and oxygen saturation is 97%.   Wt Readings from Last 3 Encounters:  10/17/17 249 lb (112.9 kg)  09/06/17 250 lb (113.4 kg)  08/14/17 230 lb (104.3 kg)     Physical Exam  Constitutional: She is oriented to person, place, and time.  HENT:  Head: Normocephalic and atraumatic.  Mouth/Throat: Oropharynx is clear and moist.  Eyes: EOM are normal. Pupils are equal, round, and reactive to light.  Neck: Normal range of motion.  Cardiovascular: Normal rate, regular rhythm and normal heart sounds.  Pulmonary/Chest: Effort normal and breath sounds normal.  Abdominal: Soft. Bowel sounds are normal.  Musculoskeletal: Normal range of motion. She exhibits no edema, tenderness or deformity.  Lymphadenopathy:    She has no cervical adenopathy.  Neurological: She is alert and oriented to person, place, and time.  Skin: Skin is warm and dry. No rash noted. No erythema.  Psychiatric: She has a normal mood and affect. Her behavior is normal. Judgment and thought content normal.  Vitals reviewed.   Lab Results  Component Value Date   WBC 13.7 (H) 11/05/2017   HGB 10.8 (L) 09/06/2017   HCT 36.3 11/05/2017   MCV 89.0 11/05/2017   PLT 327 11/05/2017     Chemistry      Component Value Date/Time   NA 138 11/05/2017 1342   NA 140 09/06/2017 1418   NA 137 01/24/2017 1143   K 4.1 11/05/2017 1342   K 4.0 09/06/2017 1418   K 4.7 01/24/2017 1143   CL 99 11/05/2017 1342   CL 98  09/06/2017 1418   CO2 30 (H) 11/05/2017 1342   CO2 30 09/06/2017 1418   CO2 30 (H) 01/24/2017 1143   BUN 26 11/05/2017 1342   BUN 19 09/06/2017 1418   BUN 15.4 08/07/2017 1500   CREATININE 0.86 11/05/2017 1342   CREATININE 0.9 09/06/2017 1418   CREATININE 0.8 08/07/2017 1500      Component Value Date/Time   CALCIUM 9.2 11/05/2017 1342   CALCIUM 9.1 09/06/2017 1418   CALCIUM 9.7 01/24/2017 1143   ALKPHOS 95 11/05/2017 1342   ALKPHOS 90 (H) 09/06/2017 1418   ALKPHOS 90 01/24/2017 1143   AST 10 11/05/2017 1342   AST 12 01/24/2017 1143   ALT 12 11/05/2017 1342   ALT 17 09/06/2017 1418   ALT 10 01/24/2017 1143   BILITOT 0.6 11/05/2017 1342   BILITOT 0.39 01/24/2017 1143  Impression and Plan: Alisha Fisher is a 72 year old white female. She has severe underlying COPD. Her FEV1 is 1.14.  Unfortunately, it looks like we are dealing with recurrence.  This is to be a real problem.  We really need another biopsy.  The biopsy that she had in the hospital was minimal.  We cannot run genetic testing on it.  Hopefully, she can have a bronchoscopy.  I would think that she has tumor accessible by bronchoscopy.  I talked to Alisha Fisher and her sister.  I spent about 45 minutes with them.  Over 50% of the time was spent face-to-face with them.  I reviewed the PET scan with them.  I went over her labs.  This is a very delicate situation.  Her pulmonary status is just not that great.  I am not sure if she qualifies for a bronchoscopy.  I am not sure how else we can get material.  I would not do any type of percutaneous procedure.  I do not think she has enough pleural fluid there to get malignant cells from.  However, this might be a consideration.  Of note, she does have iron deficiency.  I will give her some IV iron today in the office to see if this can help her feel better.  I will get in contact with pulmonary medicine to see if they can see her and see what they think about a  bronchoscopy.  I would like to see her back in about 3-4 weeks.   Volanda Napoleon, MD 2/11/20196:07 PM

## 2017-11-14 ENCOUNTER — Telehealth: Payer: Self-pay | Admitting: *Deleted

## 2017-11-14 NOTE — Telephone Encounter (Signed)
Patient called inquiring about whether Dr. Marin Olp had contacted the pulmonologist for possible biopsy. Dr. Marin Olp has contacted doctor via email but has not received response.  Patient informed of this.

## 2017-11-18 ENCOUNTER — Telehealth: Payer: Self-pay | Admitting: Emergency Medicine

## 2017-11-18 NOTE — Telephone Encounter (Signed)
Collene Gobble, MD  Desmond Dike C, CMA        Hi LL -  Need to set up this patient to be seen and then arrange probable EBUS.  RB    ------------------------------------------------------  ----- Message -----  From: Volanda Napoleon, MD  Sent: 11/11/2017  6:21 PM  To: Collene Gobble, MD   Rob: Can you see Ms. Pask quickly and see if a FOB/EBUS can be done? I really need more tissue as her cancer has progressed. I need to see if we can use targeted therapy or immunotherapy on her. Her performance status is not that great so I would prefer not to use chemotherapy.   She has bad lung function. She is O2 dependent.   As always, thanks for your expertise!!   [name]

## 2017-11-18 NOTE — Telephone Encounter (Signed)
Spoke with pt's sister, [name]. Consult has been scheduled for 12/05/17 at 2:15pm. Nothing further was needed.

## 2017-12-05 ENCOUNTER — Ambulatory Visit (INDEPENDENT_AMBULATORY_CARE_PROVIDER_SITE_OTHER): Payer: Medicare Other | Admitting: Emergency Medicine

## 2017-12-05 ENCOUNTER — Encounter: Payer: Self-pay | Admitting: Emergency Medicine

## 2017-12-05 VITALS — BP 166/94 | HR 92 | Ht 67.0 in | Wt 239.0 lb

## 2017-12-05 DIAGNOSIS — J441 Chronic obstructive pulmonary disease with (acute) exacerbation: Secondary | ICD-10-CM | POA: Diagnosis not present

## 2017-12-05 DIAGNOSIS — R918 Other nonspecific abnormal finding of lung field: Secondary | ICD-10-CM

## 2017-12-05 DIAGNOSIS — D689 Coagulation defect, unspecified: Secondary | ICD-10-CM

## 2017-12-05 DIAGNOSIS — Z01818 Encounter for other preprocedural examination: Secondary | ICD-10-CM | POA: Diagnosis not present

## 2017-12-05 DIAGNOSIS — F419 Anxiety disorder, unspecified: Secondary | ICD-10-CM

## 2017-12-05 MED ORDER — CLONAZEPAM 0.5 MG PO TABS: 0.5000 mg | ORAL_TABLET | Freq: Two times a day (BID) | ORAL | 2 refills | Status: AC | PRN

## 2017-12-05 NOTE — Progress Notes (Signed)
Subjective:    Patient ID: Alisha Fisher, female    DOB: [birth year], 72 y.o.   MRN: [id number]  HPI 72 year old woman, former smoker (pack years), with a history of hypertension, severe COPD, iron deficiency anemia.  She is also followed by Dr. Elsworth Soho for obstructive sleep apnea.  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 Followed by Dr Katheran Awe.  She underwent a PET scan on 11/05/17 that I have reviewed.  This shows reduced size of her left upper lobe pulmonary nodule with some surrounding areas of radiation scar.  There is a new 3.2 cm right infrahilar mass that is hypermetabolic with an associated pleural effusion.  There is also a hypermetabolic paraesophageal/subcarinal lymph node.    Review of Systems  Constitutional: Negative for fever and unexpected weight change.  HENT: Negative for congestion, dental problem, ear pain, nosebleeds, postnasal drip, rhinorrhea, sinus pressure, sneezing, sore throat and trouble swallowing.   Eyes: Negative for redness and itching.  Respiratory: Positive for shortness of breath. Negative for cough, chest tightness and wheezing.   Cardiovascular: Negative for palpitations and leg swelling.  Gastrointestinal: Negative for nausea and vomiting.  Genitourinary: Negative for dysuria.  Musculoskeletal: Negative for joint swelling.  Skin: Negative for rash.  Neurological: Negative for headaches.  Hematological: Does not bruise/bleed easily.  Psychiatric/Behavioral: Negative for dysphoric mood. The patient is not nervous/anxious.    Past Medical History:  Diagnosis Date  . Cancer ([place])   . COPD, severe ([place])   . HTN (hypertension)   . Iron deficiency anemia 11/07/2017  . Pneumonia   . Pulmonary emphysema ([place])   . SOB (shortness of breath)      Family History  Problem Relation Age of  Onset  . Allergies Unknown   . COPD Cousin   . Breast cancer Mother   . Diabetes Father   . Diabetes Son      Social History   Socioeconomic History  . Marital status: Widowed    Spouse name: Not on file  . Number of children: Not on file  . Years of education: Not on file  . Highest education level: Not on file  Social Needs  . Financial resource strain: Not on file  . Food insecurity - worry: Not on file  . Food insecurity - inability: Not on file  . Transportation needs - medical: Not on file  . Transportation needs - non-medical: Not on file  Occupational History  . Occupation: retired  Tobacco Use  . Smoking status: Former Smoker    Packs/day: 0.50    Years: 50.00    Pack years: 25.00    Types: Cigarettes    Last attempt to quit: 11/01/2016    Years since quitting: 1.0  . Smokeless tobacco: Never Used  Substance and Sexual Activity  . Alcohol use: No  . Drug use: No  . Sexual activity: Not on file  Other Topics Concern  . Not on file  Social History Narrative   [facility] (12/10/16):   Patient's widower son and grandchildren moved in with her. Her husband passed years ago. Previously worked in Press photographer.     Allergies  Allergen Reactions  . Ciprofloxacin Rash    Rash possibly caused by Cipro?  . Flagyl [Metronidazole] Rash    Rash possibly caused by Flagyl?  Marland Kitchen Zosyn [  Piperacillin Sod-Tazobactam So] Itching and Rash    Has patient had a PCN reaction causing immediate rash, facial/tongue/throat swelling, SOB or lightheadedness with hypotension: No Has patient had a PCN reaction causing severe rash involving mucus membranes or skin necrosis: No Has patient had a PCN reaction that required hospitalization: No Has patient had a PCN reaction occurring within the last 10 years: Yes If all of the above answers are "NO", then may proceed with Cephalosporin use.      Outpatient Medications Prior to Visit  Medication Sig Dispense Refill  . ADVAIR DISKUS 250-50  MCG/DOSE AEPB Inhale 1 puff into the lungs daily.    Marland Kitchen albuterol (PROVENTIL HFA;VENTOLIN HFA) 108 (90 Base) MCG/ACT inhaler Inhale 2 puffs into the lungs every 6 (six) hours as needed for wheezing or shortness of breath. 1 Inhaler 1  . amLODipine (NORVASC) 10 MG tablet Take 1 tablet (10 mg total) daily by mouth. 10 tablet 0  . Cholecalciferol (VITAMIN D PO) Take 5,000 Units by mouth daily.     . diphenhydrAMINE (BENADRYL) 25 mg capsule Take 25 mg by mouth every 4 (four) hours as needed for itching or allergies.    . fluticasone (FLONASE) 50 MCG/ACT nasal spray Place 2 sprays daily into both nostrils. 16 g 0  . hydrOXYzine (ATARAX/VISTARIL) 25 MG tablet Take 25 mg by mouth 3 (three) times daily as needed for anxiety.    Marland Kitchen loratadine (CLARITIN) 10 MG tablet Take 1 tablet (10 mg total) by mouth daily. (Patient taking differently: Take 10 mg daily as needed by mouth for allergies. ) 30 tablet 11  . losartan-hydrochlorothiazide (HYZAAR) 100-25 MG tablet Take 1 tablet daily by mouth. 90 tablet 1  . metoprolol succinate (TOPROL-XL) 100 MG 24 hr tablet Take 1.5 tablets (150 mg) daily with or immediately following a meal. 15 tablet 0  . senna-docusate (SENOKOT-S) 8.6-50 MG tablet Take 1 tablet at bedtime by mouth.    . Spacer/Aero-Holding Chambers (AEROCHAMBER MV) inhaler Use as instructed 1 each 0  . traZODone (DESYREL) 50 MG tablet     . umeclidinium-vilanterol (ANORO ELLIPTA) 62.5-25 MCG/INH AEPB Inhale 1 puff into the lungs daily. 30 each 0  . predniSONE (DELTASONE) 10 MG tablet 2 daily with food x 2 wks, then 1 daily with food until next appt 90 tablet 1   No facility-administered medications prior to visit.         Objective:   Physical Exam Vitals:   12/05/17 1420 12/05/17 1423  BP:  (!) 166/94  Pulse:  92  SpO2:  92%  Weight: 239 lb (108.4 kg)   Height: 5\' 7"  (1.702 m)    Gen: Pleasant, obese, in no distress,  normal affect  ENT: No lesions,  mouth clear,  oropharynx clear, no  postnasal drip  Neck: No JVD, no stridor  Lungs: No use of accessory muscles, decreased breath sounds at both bases, no wheezing  Cardiovascular: RRR, heart sounds normal, no murmur or gallops, no peripheral edema  Musculoskeletal: No deformities, no cyanosis or clubbing  Neuro: alert, non focal  Skin: Warm, no lesions or rash      Assessment & Plan:  COPD (chronic obstructive pulmonary disease) (HCC) Severe COPD.  She is currently using Anoro and we will continue.  Squamous cell lung cancer, right (HCC) Apparent recurrence in the right hilum, hypermetabolic on PET scan.  She also has some hypermetabolic lymphadenopathy.  I believe that bronchoscopy with endobronchial ultrasound will be the most straightforward modality for tissue diagnosis.  She needs to be assessed for possible suitability for targeted therapy.  We will do molecular studies.  I talked to her about the risks and benefits of general anesthesia and the procedure.  Anxiety disorder She is asking me for xanax. She says that Dr Arelia Sneddon does not want to prescribe for her but that he is OK if I take that responsibility - unclear to me why. I will write her for a script now. If beneficial then we will need to have a conversation with him regarding whether he wants her to stay on it, give that role back over to him.   Baltazar Apo, MD, PhD 12/05/2017, 3:13 PM [facility] [phone] or if no answer [phone]

## 2017-12-05 NOTE — H&P (View-Only) (Signed)
Subjective:    Patient ID: Alisha Fisher, female    DOB: 25-May-1946, 72 y.o.   MRN: [id number]  HPI 72 year old woman, former smoker (pack years), with a history of hypertension, severe COPD, iron deficiency anemia.  She is also followed by Dr. Elsworth Soho for obstructive sleep apnea.  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 Followed by Dr Katheran Awe.  She underwent a PET scan on 11/05/17 that I have reviewed.  This shows reduced size of her left upper lobe pulmonary nodule with some surrounding areas of radiation scar.  There is a new 3.2 cm right infrahilar mass that is hypermetabolic with an associated pleural effusion.  There is also a hypermetabolic paraesophageal/subcarinal lymph node.    Review of Systems  Constitutional: Negative for fever and unexpected weight change.  HENT: Negative for congestion, dental problem, ear pain, nosebleeds, postnasal drip, rhinorrhea, sinus pressure, sneezing, sore throat and trouble swallowing.   Eyes: Negative for redness and itching.  Respiratory: Positive for shortness of breath. Negative for cough, chest tightness and wheezing.   Cardiovascular: Negative for palpitations and leg swelling.  Gastrointestinal: Negative for nausea and vomiting.  Genitourinary: Negative for dysuria.  Musculoskeletal: Negative for joint swelling.  Skin: Negative for rash.  Neurological: Negative for headaches.  Hematological: Does not bruise/bleed easily.  Psychiatric/Behavioral: Negative for dysphoric mood. The patient is not nervous/anxious.    Past Medical History:  Diagnosis Date  . Cancer ([place])   . COPD, severe ([place])   . HTN (hypertension)   . Iron deficiency anemia 11/07/2017  . Pneumonia   . Pulmonary emphysema ([place])   . SOB (shortness of breath)      Family History  Problem Relation Age of  Onset  . Allergies Unknown   . COPD Cousin   . Breast cancer Mother   . Diabetes Father   . Diabetes Son      Social History   Socioeconomic History  . Marital status: Widowed    Spouse name: Not on file  . Number of children: Not on file  . Years of education: Not on file  . Highest education level: Not on file  Social Needs  . Financial resource strain: Not on file  . Food insecurity - worry: Not on file  . Food insecurity - inability: Not on file  . Transportation needs - medical: Not on file  . Transportation needs - non-medical: Not on file  Occupational History  . Occupation: retired  Tobacco Use  . Smoking status: Former Smoker    Packs/day: 0.50    Years: 50.00    Pack years: 25.00    Types: Cigarettes    Last attempt to quit: 11/01/2016    Years since quitting: 1.0  . Smokeless tobacco: Never Used  Substance and Sexual Activity  . Alcohol use: No  . Drug use: No  . Sexual activity: Not on file  Other Topics Concern  . Not on file  Social History Narrative   [facility] (12/10/16):   Patient's widower son and grandchildren moved in with her. Her husband passed years ago. Previously worked in [employer].     Allergies  Allergen Reactions  . Ciprofloxacin Rash    Rash possibly caused by Cipro?  . Flagyl [Metronidazole] Rash    Rash possibly caused by Flagyl?  Marland Kitchen Zosyn [  Piperacillin Sod-Tazobactam So] Itching and Rash    Has patient had a PCN reaction causing immediate rash, facial/tongue/throat swelling, SOB or lightheadedness with hypotension: No Has patient had a PCN reaction causing severe rash involving mucus membranes or skin necrosis: No Has patient had a PCN reaction that required hospitalization: No Has patient had a PCN reaction occurring within the last 10 years: Yes If all of the above answers are "NO", then may proceed with Cephalosporin use.      Outpatient Medications Prior to Visit  Medication Sig Dispense Refill  . ADVAIR DISKUS 250-50  MCG/DOSE AEPB Inhale 1 puff into the lungs daily.    Marland Kitchen albuterol (PROVENTIL HFA;VENTOLIN HFA) 108 (90 Base) MCG/ACT inhaler Inhale 2 puffs into the lungs every 6 (six) hours as needed for wheezing or shortness of breath. 1 Inhaler 1  . amLODipine (NORVASC) 10 MG tablet Take 1 tablet (10 mg total) daily by mouth. 10 tablet 0  . Cholecalciferol (VITAMIN D PO) Take 5,000 Units by mouth daily.     . diphenhydrAMINE (BENADRYL) 25 mg capsule Take 25 mg by mouth every 4 (four) hours as needed for itching or allergies.    . fluticasone (FLONASE) 50 MCG/ACT nasal spray Place 2 sprays daily into both nostrils. 16 g 0  . hydrOXYzine (ATARAX/VISTARIL) 25 MG tablet Take 25 mg by mouth 3 (three) times daily as needed for anxiety.    Marland Kitchen loratadine (CLARITIN) 10 MG tablet Take 1 tablet (10 mg total) by mouth daily. (Patient taking differently: Take 10 mg daily as needed by mouth for allergies. ) 30 tablet 11  . losartan-hydrochlorothiazide (HYZAAR) 100-25 MG tablet Take 1 tablet daily by mouth. 90 tablet 1  . metoprolol succinate (TOPROL-XL) 100 MG 24 hr tablet Take 1.5 tablets (150 mg) daily with or immediately following a meal. 15 tablet 0  . senna-docusate (SENOKOT-S) 8.6-50 MG tablet Take 1 tablet at bedtime by mouth.    . Spacer/Aero-Holding Chambers (AEROCHAMBER MV) inhaler Use as instructed 1 each 0  . traZODone (DESYREL) 50 MG tablet     . umeclidinium-vilanterol (ANORO ELLIPTA) 62.5-25 MCG/INH AEPB Inhale 1 puff into the lungs daily. 30 each 0  . predniSONE (DELTASONE) 10 MG tablet 2 daily with food x 2 wks, then 1 daily with food until next appt 90 tablet 1   No facility-administered medications prior to visit.         Objective:   Physical Exam Vitals:   12/05/17 1420 12/05/17 1423  BP:  (!) 166/94  Pulse:  92  SpO2:  92%  Weight: 239 lb (108.4 kg)   Height: 5\' 7"  (1.702 m)    Gen: Pleasant, obese, in no distress,  normal affect  ENT: No lesions,  mouth clear,  oropharynx clear, no  postnasal drip  Neck: No JVD, no stridor  Lungs: No use of accessory muscles, decreased breath sounds at both bases, no wheezing  Cardiovascular: RRR, heart sounds normal, no murmur or gallops, no peripheral edema  Musculoskeletal: No deformities, no cyanosis or clubbing  Neuro: alert, non focal  Skin: Warm, no lesions or rash      Assessment & Plan:  COPD (chronic obstructive pulmonary disease) (HCC) Severe COPD.  She is currently using Anoro and we will continue.  Squamous cell lung cancer, right (HCC) Apparent recurrence in the right hilum, hypermetabolic on PET scan.  She also has some hypermetabolic lymphadenopathy.  I believe that bronchoscopy with endobronchial ultrasound will be the most straightforward modality for tissue diagnosis.  She needs to be assessed for possible suitability for targeted therapy.  We will do molecular studies.  I talked to her about the risks and benefits of general anesthesia and the procedure.  Anxiety disorder She is asking me for xanax. She says that Dr Arelia Sneddon does not want to prescribe for her but that he is OK if I take that responsibility - unclear to me why. I will write her for a script now. If beneficial then we will need to have a conversation with him regarding whether he wants her to stay on it, give that role back over to him.   Baltazar Apo, MD, PhD 12/05/2017, 3:13 PM [facility] [phone] or if no answer [phone]

## 2017-12-05 NOTE — Assessment & Plan Note (Signed)
She is asking me for xanax. She says that Dr Arelia Sneddon does not want to prescribe for her but that he is OK if I take that responsibility - unclear to me why. I will write her for a script now. If beneficial then we will need to have a conversation with him regarding whether he wants her to stay on it, give that role back over to him.

## 2017-12-05 NOTE — Assessment & Plan Note (Signed)
Severe COPD.  She is currently using Anoro and we will continue.

## 2017-12-05 NOTE — Patient Instructions (Addendum)
We will arrange for bronchoscopy with endobronchial ultrasound to evaluate your right sided lung abnormality.  This will be done under general anesthesia. Please continue your Anoro once a day as you have been taking it Use xanax 0.5 mg up to every 12 hours hours if needed for anxiety.  If you benefit from this short-term prescription then we can have a conversation with Dr. Arelia Sneddon and see if he wants to to stay on this medication. Follow with Dr Lamonte Sakai next available

## 2017-12-05 NOTE — Assessment & Plan Note (Signed)
Apparent recurrence in the right hilum, hypermetabolic on PET scan.  She also has some hypermetabolic lymphadenopathy.  I believe that bronchoscopy with endobronchial ultrasound will be the most straightforward modality for tissue diagnosis.  She needs to be assessed for possible suitability for targeted therapy.  We will do molecular studies.  I talked to her about the risks and benefits of general anesthesia and the procedure.

## 2017-12-06 ENCOUNTER — Encounter (HOSPITAL_COMMUNITY): Payer: Self-pay | Admitting: Urology

## 2017-12-09 ENCOUNTER — Encounter (HOSPITAL_COMMUNITY): Payer: Self-pay | Admitting: *Deleted

## 2017-12-09 ENCOUNTER — Other Ambulatory Visit: Payer: Self-pay

## 2017-12-09 ENCOUNTER — Ambulatory Visit: Payer: Medicare Other | Admitting: Hematology & Oncology

## 2017-12-09 ENCOUNTER — Other Ambulatory Visit: Payer: Medicare Other

## 2017-12-09 NOTE — Anesthesia Preprocedure Evaluation (Addendum)
Anesthesia Evaluation  Patient identified by MRN, date of birth, ID band Patient awake    Reviewed: Allergy & Precautions, NPO status , Patient's Chart, lab work & pertinent test results, reviewed documented beta blocker date and time   History of Anesthesia Complications Negative for: history of anesthetic complications  Airway Mallampati: III  TM Distance: >3 FB Neck ROM: Full    Dental  (+) Dental Advisory Given   Pulmonary shortness of breath, sleep apnea , COPD,  COPD inhaler and oxygen dependent, former smoker,  Lung Mass  11/18 CXR - An area of opacity in the right mid lung field and right hilar region corresponds to the radiation changes seen on the prior CT. Left upper lobe streaky densities also seen on the prior CT. The previously seen nodule/ neoplasm in the left upper lobe is not well visualized on this radiograph. No new consolidative changes. There is no pleural effusion or pneumothorax. There is right pleural thickening. There is cardiomegaly with coronary vascular calcification. There is atherosclerotic calcification of the aortic arch.    + rhonchi    (-) rales    Cardiovascular hypertension, Pt. on medications and Pt. on home beta blockers  Rhythm:Regular Rate:Normal  '18 TTE - EF 65% to 70%. Grade 2 diastolic dysfunction. Mild AS and AI. Mildly dilated LA. Mild TR. A trivial pericardial effusion was identified. There was a large left pleural effusion.   Neuro/Psych Anxiety negative neurological ROS     GI/Hepatic Neg liver ROS, s/p abd abscess   Endo/Other  Obesity  Renal/GU negative Renal ROS     Musculoskeletal   Abdominal (+) + obese,   Peds  Hematology negative hematology ROS (+)   Anesthesia Other Findings   Reproductive/Obstetrics                            Anesthesia Physical  Anesthesia Plan  ASA: III  Anesthesia Plan: General   Post-op Pain Management:     Induction: Intravenous  PONV Risk Score and Plan: 3 and Treatment may vary due to age or medical condition, Ondansetron and Dexamethasone  Airway Management Planned: Oral ETT  Additional Equipment: None  Intra-op Plan:   Post-operative Plan: Extubation in OR  Informed Consent: I have reviewed the patients History and Physical, chart, labs and discussed the procedure including the risks, benefits and alternatives for the proposed anesthesia with the patient or authorized representative who has indicated his/her understanding and acceptance.   Dental advisory given  Plan Discussed with: CRNA  Anesthesia Plan Comments:         Anesthesia Quick Evaluation

## 2017-12-10 ENCOUNTER — Encounter (HOSPITAL_COMMUNITY): Payer: Self-pay

## 2017-12-10 ENCOUNTER — Ambulatory Visit (HOSPITAL_COMMUNITY): Payer: Medicare Other | Admitting: Anesthesiology

## 2017-12-10 ENCOUNTER — Other Ambulatory Visit: Payer: Self-pay | Admitting: *Deleted

## 2017-12-10 ENCOUNTER — Ambulatory Visit (HOSPITAL_COMMUNITY)
Admission: RE | Admit: 2017-12-10 | Discharge: 2017-12-10 | Disposition: A | Discharge status: Home / Self Care | Payer: Medicare Other | Source: Ambulatory Visit | Attending: Emergency Medicine | Admitting: Emergency Medicine

## 2017-12-10 ENCOUNTER — Other Ambulatory Visit: Payer: Self-pay

## 2017-12-10 ENCOUNTER — Encounter (HOSPITAL_COMMUNITY)
Admission: RE | Disposition: A | Discharge status: Home / Self Care | Payer: Self-pay | Source: Ambulatory Visit | Attending: Emergency Medicine

## 2017-12-10 DIAGNOSIS — R590 Localized enlarged lymph nodes: Secondary | ICD-10-CM | POA: Insufficient documentation

## 2017-12-10 DIAGNOSIS — G4733 Obstructive sleep apnea (adult) (pediatric): Secondary | ICD-10-CM | POA: Diagnosis not present

## 2017-12-10 DIAGNOSIS — Z79899 Other long term (current) drug therapy: Secondary | ICD-10-CM | POA: Insufficient documentation

## 2017-12-10 DIAGNOSIS — F419 Anxiety disorder, unspecified: Secondary | ICD-10-CM | POA: Insufficient documentation

## 2017-12-10 DIAGNOSIS — R918 Other nonspecific abnormal finding of lung field: Secondary | ICD-10-CM

## 2017-12-10 DIAGNOSIS — Z87891 Personal history of nicotine dependence: Secondary | ICD-10-CM | POA: Diagnosis not present

## 2017-12-10 DIAGNOSIS — Z7952 Long term (current) use of systemic steroids: Secondary | ICD-10-CM | POA: Diagnosis not present

## 2017-12-10 DIAGNOSIS — Z7951 Long term (current) use of inhaled steroids: Secondary | ICD-10-CM | POA: Diagnosis not present

## 2017-12-10 DIAGNOSIS — J439 Emphysema, unspecified: Secondary | ICD-10-CM | POA: Diagnosis not present

## 2017-12-10 DIAGNOSIS — E669 Obesity, unspecified: Secondary | ICD-10-CM | POA: Diagnosis not present

## 2017-12-10 DIAGNOSIS — Z9981 Dependence on supplemental oxygen: Secondary | ICD-10-CM | POA: Diagnosis not present

## 2017-12-10 DIAGNOSIS — D509 Iron deficiency anemia, unspecified: Secondary | ICD-10-CM | POA: Insufficient documentation

## 2017-12-10 DIAGNOSIS — I10 Essential (primary) hypertension: Secondary | ICD-10-CM | POA: Insufficient documentation

## 2017-12-10 DIAGNOSIS — Z923 Personal history of irradiation: Secondary | ICD-10-CM | POA: Diagnosis not present

## 2017-12-10 DIAGNOSIS — C3481 Malignant neoplasm of overlapping sites of right bronchus and lung: Secondary | ICD-10-CM | POA: Insufficient documentation

## 2017-12-10 DIAGNOSIS — Z6837 Body mass index (BMI) 37.0-37.9, adult: Secondary | ICD-10-CM | POA: Insufficient documentation

## 2017-12-10 HISTORY — PX: VIDEO BRONCHOSCOPY WITH ENDOBRONCHIAL ULTRASOUND: SHX6177

## 2017-12-10 HISTORY — DX: Sleep apnea, unspecified: G47.30

## 2017-12-10 LAB — CBC
HCT: 36.3 % (ref 36.0–46.0)
Hemoglobin: 11.6 g/dL — ABNORMAL LOW (ref 12.0–15.0)
MCH: 29.9 pg (ref 26.0–34.0)
MCHC: 32 g/dL (ref 30.0–36.0)
MCV: 93.6 fL (ref 78.0–100.0)
Platelets: 328 K/uL (ref 150–400)
RBC: 3.88 MIL/uL (ref 3.87–5.11)
RDW: 14.5 % (ref 11.5–15.5)
WBC: 12.9 K/uL — ABNORMAL HIGH (ref 4.0–10.5)

## 2017-12-10 LAB — BASIC METABOLIC PANEL WITH GFR
Anion gap: 11 (ref 5–15)
BUN: 16 mg/dL (ref 6–20)
CO2: 27 mmol/L (ref 22–32)
Calcium: 8.7 mg/dL — ABNORMAL LOW (ref 8.9–10.3)
Chloride: 98 mmol/L — ABNORMAL LOW (ref 101–111)
Creatinine, Ser: 0.84 mg/dL (ref 0.44–1.00)
GFR calc Af Amer: >60 mL/min
GFR calc non Af Amer: >60 mL/min
Glucose, Bld: 99 mg/dL (ref 65–99)
Potassium: 4.1 mmol/L (ref 3.5–5.1)
Sodium: 136 mmol/L (ref 135–145)

## 2017-12-10 LAB — APTT: aPTT: 30 s (ref 24–36)

## 2017-12-10 LAB — PROTIME-INR
INR: 1
Prothrombin Time: 13.1 s (ref 11.4–15.2)

## 2017-12-10 SURGERY — BRONCHOSCOPY, WITH EBUS
Anesthesia: General

## 2017-12-10 MED ORDER — FLUTICASONE PROPIONATE 50 MCG/ACT NA SUSP: 2.0000 | Freq: Every day | NASAL | Status: AC | PRN

## 2017-12-10 MED ORDER — METOPROLOL SUCCINATE ER 25 MG PO TB24
150.0000 mg | ORAL_TABLET | Freq: Once | ORAL | Status: AC
  Administered 2017-12-10: 150 mg via ORAL

## 2017-12-10 MED ORDER — OXYCODONE HCL 5 MG/5ML PO SOLN: 5.0000 mg | Freq: Once | ORAL | Status: DC | PRN

## 2017-12-10 MED ORDER — FENTANYL CITRATE (PF) 100 MCG/2ML IJ SOLN: 25.0000 ug | INTRAMUSCULAR | Status: DC | PRN

## 2017-12-10 MED ORDER — LACTATED RINGERS IV SOLN
INTRAVENOUS | Status: DC | PRN
  Administered 2017-12-10: 07:00:00 via INTRAVENOUS

## 2017-12-10 MED ORDER — ROCURONIUM BROMIDE 100 MG/10ML IV SOLN
INTRAVENOUS | Status: DC | PRN
  Administered 2017-12-10: 50 mg via INTRAVENOUS

## 2017-12-10 MED ORDER — 0.9 % SODIUM CHLORIDE (POUR BTL) OPTIME
TOPICAL | Status: DC | PRN
  Administered 2017-12-10: 1000 mL

## 2017-12-10 MED ORDER — PROPOFOL 10 MG/ML IV BOLUS
INTRAVENOUS | Status: DC | PRN
  Administered 2017-12-10: 250 mg via INTRAVENOUS

## 2017-12-10 MED ORDER — SUGAMMADEX SODIUM 200 MG/2ML IV SOLN
INTRAVENOUS | Status: DC | PRN
  Administered 2017-12-10: 216.8 mg via INTRAVENOUS

## 2017-12-10 MED ORDER — METOPROLOL SUCCINATE ER 25 MG PO TB24
ORAL_TABLET | ORAL | Status: AC
  Filled 2017-12-10: qty 6

## 2017-12-10 MED ORDER — FENTANYL CITRATE (PF) 250 MCG/5ML IJ SOLN
INTRAMUSCULAR | Status: AC
  Filled 2017-12-10: qty 5

## 2017-12-10 MED ORDER — LORATADINE 10 MG PO TABS: 10.0000 mg | ORAL_TABLET | Freq: Every day | ORAL | Status: AC | PRN

## 2017-12-10 MED ORDER — DIPHENHYDRAMINE HCL 50 MG/ML IJ SOLN
INTRAMUSCULAR | Status: DC | PRN
  Administered 2017-12-10: 25 mg via INTRAVENOUS

## 2017-12-10 MED ORDER — PROPOFOL 10 MG/ML IV BOLUS
INTRAVENOUS | Status: AC
  Filled 2017-12-10: qty 40

## 2017-12-10 MED ORDER — EPHEDRINE SULFATE 50 MG/ML IJ SOLN
INTRAMUSCULAR | Status: DC | PRN
  Administered 2017-12-10: 10 mg via INTRAVENOUS

## 2017-12-10 MED ORDER — FENTANYL CITRATE (PF) 100 MCG/2ML IJ SOLN
INTRAMUSCULAR | Status: DC | PRN
  Administered 2017-12-10: 100 ug via INTRAVENOUS

## 2017-12-10 MED ORDER — METOPROLOL SUCCINATE ER 100 MG PO TB24: 150.0000 mg | ORAL_TABLET | Freq: Every day | ORAL | Status: DC

## 2017-12-10 MED ORDER — OXYCODONE HCL 5 MG PO TABS: 5.0000 mg | ORAL_TABLET | Freq: Once | ORAL | Status: DC | PRN

## 2017-12-10 MED ORDER — PHENYLEPHRINE HCL 10 MG/ML IJ SOLN
INTRAVENOUS | Status: DC | PRN
  Administered 2017-12-10: 20 ug/min via INTRAVENOUS

## 2017-12-10 MED ORDER — SENNOSIDES-DOCUSATE SODIUM 8.6-50 MG PO TABS: 1.0000 | ORAL_TABLET | Freq: Every day | ORAL | Status: DC

## 2017-12-10 MED ORDER — PHENYLEPHRINE HCL 10 MG/ML IJ SOLN
INTRAMUSCULAR | Status: DC | PRN
  Administered 2017-12-10 (×2): 100 ug via INTRAVENOUS
  Administered 2017-12-10: 50 ug via INTRAVENOUS

## 2017-12-10 MED ORDER — DEXAMETHASONE SODIUM PHOSPHATE 10 MG/ML IJ SOLN
INTRAMUSCULAR | Status: DC | PRN
  Administered 2017-12-10: 10 mg via INTRAVENOUS

## 2017-12-10 MED ORDER — ONDANSETRON HCL 4 MG/2ML IJ SOLN
INTRAMUSCULAR | Status: DC | PRN
  Administered 2017-12-10: 4 mg via INTRAVENOUS

## 2017-12-10 MED ORDER — ADVAIR DISKUS 250-50 MCG/DOSE IN AEPB: 1.0000 | INHALATION_SPRAY | Freq: Two times a day (BID) | RESPIRATORY_TRACT | 5 refills | Status: DC

## 2017-12-10 MED ORDER — ALBUTEROL SULFATE HFA 108 (90 BASE) MCG/ACT IN AERS
INHALATION_SPRAY | RESPIRATORY_TRACT | Status: DC | PRN
  Administered 2017-12-10 (×3): 4 via RESPIRATORY_TRACT

## 2017-12-10 MED ORDER — ONDANSETRON HCL 4 MG/2ML IJ SOLN: 4.0000 mg | Freq: Once | INTRAMUSCULAR | Status: DC | PRN

## 2017-12-10 MED ORDER — LIDOCAINE HCL (CARDIAC) 20 MG/ML IV SOLN
INTRAVENOUS | Status: DC | PRN
  Administered 2017-12-10: 60 mg via INTRAVENOUS

## 2017-12-10 SURGICAL SUPPLY — 32 items
BRUSH CYTOL CELLEBRITY 1.5X140 (MISCELLANEOUS) ×2 IMPLANT
CANISTER SUCT 3000ML PPV (MISCELLANEOUS) ×3 IMPLANT
CONT SPEC 4OZ CLIKSEAL STRL BL (MISCELLANEOUS) ×3 IMPLANT
COVER BACK TABLE 60X90IN (DRAPES) ×3 IMPLANT
COVER DOME SNAP 22 D (MISCELLANEOUS) ×3 IMPLANT
FORCEPS BIOP RJ4 1.8 (CUTTING FORCEPS) IMPLANT
FORCEPS RADIAL JAW LRG 4 PULM (INSTRUMENTS) IMPLANT
GAUZE SPONGE 4X4 12PLY STRL (GAUZE/BANDAGES/DRESSINGS) ×3 IMPLANT
GLOVE BIO SURGEON STRL SZ7.5 (GLOVE) ×3 IMPLANT
GOWN STRL REUS W/ TWL LRG LVL3 (GOWN DISPOSABLE) ×1 IMPLANT
GOWN STRL REUS W/TWL LRG LVL3 (GOWN DISPOSABLE) ×3
KIT CLEAN ENDO COMPLIANCE (KITS) ×6 IMPLANT
KIT ROOM TURNOVER OR (KITS) ×3 IMPLANT
MARKER SKIN DUAL TIP RULER LAB (MISCELLANEOUS) ×3 IMPLANT
NDL EBUS SONO TIP PENTAX (NEEDLE) ×1 IMPLANT
NEEDLE EBUS SONO TIP PENTAX (NEEDLE) ×3 IMPLANT
NS IRRIG 1000ML POUR BTL (IV SOLUTION) ×3 IMPLANT
OIL SILICONE PENTAX (PARTS (SERVICE/REPAIRS)) ×3 IMPLANT
PAD ARMBOARD 7.5X6 YLW CONV (MISCELLANEOUS) ×6 IMPLANT
RADIAL JAW LRG 4 PULMONARY (INSTRUMENTS) ×2
SNARE SHORT THROW 13M SML OVAL (MISCELLANEOUS) ×2 IMPLANT
SYR 20CC LL (SYRINGE) ×6 IMPLANT
SYR 20ML ECCENTRIC (SYRINGE) ×6 IMPLANT
SYR 50ML SLIP (SYRINGE) IMPLANT
SYR 5ML LUER SLIP (SYRINGE) ×3 IMPLANT
TOWEL OR 17X24 6PK STRL BLUE (TOWEL DISPOSABLE) ×3 IMPLANT
TRAP SPECIMEN MUCOUS 40CC (MISCELLANEOUS) IMPLANT
TUBE CONNECTING 20'X1/4 (TUBING) ×2
TUBE CONNECTING 20X1/4 (TUBING) ×4 IMPLANT
UNDERPAD 30X30 (UNDERPADS AND DIAPERS) ×3 IMPLANT
VALVE DISPOSABLE (MISCELLANEOUS) ×3 IMPLANT
WATER STERILE IRR 1000ML POUR (IV SOLUTION) ×3 IMPLANT

## 2017-12-10 NOTE — Transfer of Care (Signed)
Immediate Anesthesia Transfer of Care Note  Patient: Alisha Fisher  Procedure(s) Performed: VIDEO BRONCHOSCOPY WITH ENDOBRONCHIAL ULTRASOUND (N/A )  Patient Location: PACU  Anesthesia Type:General  Level of Consciousness: awake, alert , oriented and patient cooperative  Airway & Oxygen Therapy: Patient connected to face mask oxygen  Post-op Assessment: Report given to RN and Post -op Vital signs reviewed and stable  Post vital signs: Reviewed and stable  Last Vitals:  Vitals:   12/10/17 0609  BP: 140/69  Pulse: 87  Resp: 17  Temp: 36.9 C  SpO2: 97%    Last Pain:  Vitals:   12/10/17 0609  TempSrc: Oral         Complications: No apparent anesthesia complications

## 2017-12-10 NOTE — Interval H&P Note (Signed)
PCCM Interval Note  72 year old woman with a history of non-small cell lung cancer with radiation therapy now with PET positive right hilar mass and mediastinal lymphadenopathy.  She presents for further diagnostic evaluation.  She's been having cough, about at her baseline.  Is running out of her Advair.  No other issues.  She uses CPAP for sleep apnea.  Vitals:   12/10/17 0609 12/10/17 0622  BP: 140/69   Pulse: 87   Resp: 17   Temp: 98.4 F (36.9 C)   TempSrc: Oral   SpO2: 97%   Weight:  108.4 kg (239 lb)  Height:  5\' 7"  (1.702 m)   Gen: Pleasant, obese woman, in no distress,  normal affect although a bit nervous about the procedure  ENT: No lesions,  mouth clear,  oropharynx clear, no postnasal drip, M3 to 4 airway  Neck: No JVD, no stridor  Lungs: No use of accessory muscles, some rhonchi heard on the right with some bronchial breath sounds, no wheezing  Cardiovascular: RRR, heart sounds normal, no murmur or gallops, trace peripheral edema  Abdomen: soft and NT, no HSM,  BS normal   Musculoskeletal: No deformities, no cyanosis or clubbing  Neuro: alert, non focal  Skin: Warm, no lesions or rashes   Impression / Plans:   Best approach to get a tissue diagnosis for the right hilar lesion in the right mediastinal lymphadenopathy is bronchoscopy with bronchial ultrasound.  Procedure explained to the patient, risks and benefits.  She understands and agrees to proceed.  Baltazar Apo, MD, PhD 12/10/2017, 7:33 AM [facility] [phone] or if no answer [phone]

## 2017-12-10 NOTE — Anesthesia Postprocedure Evaluation (Signed)
Anesthesia Post Note  Patient: Alisha Fisher  Procedure(s) Performed: VIDEO BRONCHOSCOPY WITH ENDOBRONCHIAL ULTRASOUND (N/A )     Patient location during evaluation: PACU Anesthesia Type: General Level of consciousness: awake and alert Pain management: pain level controlled Vital Signs Assessment: post-procedure vital signs reviewed and stable Respiratory status: spontaneous breathing, nonlabored ventilation, respiratory function stable and patient connected to nasal cannula oxygen Cardiovascular status: blood pressure returned to baseline and stable Postop Assessment: no apparent nausea or vomiting Anesthetic complications: no    Last Vitals:  Vitals:   12/10/17 0940 12/10/17 0954  BP: 140/67 133/63  Pulse: 97 88  Resp: 18 20  Temp:  37.1 C  SpO2: 93% 95%    Last Pain:  Vitals:   12/10/17 0940  TempSrc:   PainSc: 0-No pain                 Audry Pili

## 2017-12-10 NOTE — Interval H&P Note (Signed)
Addendum:   Of note patient has sleep apnea and we will have CPAP available where she needs it during the recovery and wakeup process.  Also she is out of her Advair at home and if possible I will prescribe for her here, then have our office give refills to pharmacy.  Baltazar Apo, MD, PhD 12/10/2017, 7:37 AM [facility] [phone] or if no answer [phone]

## 2017-12-10 NOTE — Anesthesia Procedure Notes (Signed)
Procedure Name: Intubation Date/Time: 12/10/2017 7:46 AM Performed by: Adalberto Ill, CRNA Pre-anesthesia Checklist: Patient identified, Emergency Drugs available, Suction available, Patient being monitored and Timeout performed Patient Re-evaluated:Patient Re-evaluated prior to induction Oxygen Delivery Method: Circle system utilized Preoxygenation: Pre-oxygenation with 100% oxygen Induction Type: IV induction Ventilation: Mask ventilation without difficulty Laryngoscope Size: Miller and 2 Grade View: Grade I Tube type: Oral Tube size: 8.5 (lubricated cuff and lower tube with ky, size per surgeon verbal order) mm Number of attempts: 1 (brief atraumatic ) Airway Equipment and Method: Stylet Placement Confirmation: ETT inserted through vocal cords under direct vision,  positive ETCO2 and breath sounds checked- equal and bilateral Secured at: 23 cm Dental Injury: Teeth and Oropharynx as per pre-operative assessment

## 2017-12-10 NOTE — Op Note (Signed)
Video Bronchoscopy with Endobronchial Ultrasound Procedure Note  Date of Operation: 12/10/2017  Pre-op Diagnosis: Right hilar mass, hypermetabolic on PET scan  Post-op Diagnosis: Primary lung cancer on gross inspection  Surgeon: Baltazar Apo  Assistants: None  Anesthesia: General endotracheal anesthesia  Operation: Flexible video fiberoptic bronchoscopy with endobronchial ultrasound and biopsies.  Estimated Blood Loss: 30 cc  Complications: None apparent  Indications and History: Alisha Fisher is a 72 y.o. female with history of tobacco use, previously diagnosed non-small cell lung cancer.  She was found to have new enlarging right hilar mass with mediastinal lymphadenopathy.  PET scan was performed to further evaluate and hypermetabolism was noted.  Recommendation was made to achieve tissue diagnosis via endobronchial ultrasound and fiberoptic bronchoscopy with biopsies.  The risks, benefits, complications, treatment options and expected outcomes were discussed with the patient.  The possibilities of pneumothorax, pneumonia, reaction to medication, pulmonary aspiration, perforation of a viscus, bleeding, failure to diagnose a condition and creating a complication requiring transfusion or operation were discussed with the patient who freely signed the consent.    Description of Procedure: The patient was examined in the preoperative area and history and data from the preprocedure consultation were reviewed. It was deemed appropriate to proceed.  The patient was taken to OR 11, identified as [facility] and the procedure verified as Flexible Video Fiberoptic Bronchoscopy.  A Time Out was held and the above information confirmed. After being taken to the operating room general anesthesia was initiated and the patient  was orally intubated. The video fiberoptic bronchoscope was introduced via the endotracheal tube and a general inspection was performed which showed normal airways on the left.   There was irregular, raised, friable mucosa at the right upper lobe carina extending down into the bronchus intermedius.  Endobronchial biopsies were performed on the right upper lobe carina.  In the distal bronchus intermedius there was a white friable mass occluding the airway completely.  Endobronchial brushings of this mass were performed and sent for cytology.  Forceps biopsies were then performed on the mass for pathology and also to debride.  Multiple samples were taken and the distal airways leading to the right lower lobe were subsequently identified after debridement the bronchoscope would pass the obstructing lesion into the right lower lobe.  The right middle lobe airway was never revealed.  The standard scope was then withdrawn and the endobronchial ultrasound was used to identify and characterize the peritracheal, hilar and bronchial lymph nodes. Inspection showed slight enlargement at 4R, 7, and a right hilar mass in the 10 R region. Using real-time ultrasound guidance Wang needle biopsies were take from the right hilar mass and were sent for cytology. The patient tolerated the procedure well without apparent complications. There was no significant blood loss. The bronchoscope was withdrawn. Anesthesia was reversed and the patient was taken to the PACU for recovery.   Samples: 1.  Wang needle biopsies from right hilar mass (10 R region) 2.  Endobronchial biopsy from the right upper lobe carina 3.  Endobronchial biopsies from the distal bronchus intermedius obstructing mass 4.  Endobronchial brushings from the distal bronchus intermedius obstructing mass  Plans:  The patient will be discharged from the PACU to home when recovered from anesthesia. We will review the cytology, pathology results with the patient when they become available. Outpatient followup will be with Dr Lamonte Sakai and Dr Katheran Awe.Baltazar Apo, MD, PhD 12/10/2017, 9:03 AM [facility] [phone] or if  no  answer [phone]

## 2017-12-10 NOTE — Discharge Instructions (Signed)
Flexible Bronchoscopy, Care After These instructions give you information on caring for yourself after your procedure. Your doctor may also give you more specific instructions. Call your doctor if you have any problems or questions after your procedure. Follow these instructions at home:  Do not eat or drink anything for 2 hours after your procedure. If you try to eat or drink before the medicine wears off, food or drink could go into your lungs. You could also burn yourself.  After 2 hours have passed and when you can cough and gag normally, you may eat soft food and drink liquids slowly.  The day after the test, you may eat your normal diet.  You may do your normal activities.  Keep all doctor visits. Get help right away if:  You get more and more short of breath.  You get light-headed.  You feel like you are going to pass out (faint).  You have chest pain.  You have new problems that worry you.  You cough up more than a little blood.  You cough up more blood than before.  Please call our office for any questions or concerns.  [phone].   This information is not intended to replace advice given to you by your health care provider. Make sure you discuss any questions you have with your health care provider. Document Released: 07/15/2009 Document Revised: 02/23/2016 Document Reviewed: 05/22/2013 Elsevier Interactive Patient Education  2017 [employer].

## 2017-12-11 ENCOUNTER — Encounter (HOSPITAL_COMMUNITY): Payer: Self-pay | Admitting: Emergency Medicine

## 2017-12-13 ENCOUNTER — Telehealth: Payer: Self-pay | Admitting: Emergency Medicine

## 2017-12-13 MED ORDER — DOXYCYCLINE HYCLATE 100 MG PO TABS: 100.0000 mg | ORAL_TABLET | Freq: Two times a day (BID) | ORAL | 0 refills | Status: DC

## 2017-12-13 NOTE — Telephone Encounter (Signed)
The sore throat and chills aren't really abnormal, nor is the small amount of blood. They should get better on 1-2 more days. But the chills are uncommon, as is fever. It might be wise to have her take abx incase she is developing an acute bronchitis.   Please ask her to start doxycycline 100mg  bid x 7 days.  Call us next week to report how she is doing

## 2017-12-13 NOTE — Telephone Encounter (Signed)
Spoke with the pt  She had bronch on 3.12.19 She states that she had vomiting the day after  She states her throat feels sore, and she is coughing up very small amounts of blood (this is getting better every day)  She states ever since the procedure she has had chills off and on but she is unsure of fever (can not find her thermometer) She states that the symptoms are tolerable, but wants to know how long to expect them  Please advise, thanks!

## 2017-12-13 NOTE — Telephone Encounter (Signed)
Received a PA request for patient's Advair 250/50 mcg inhaler. PA has been started on [email or web address]. Key is [id number].   Holland Falling will fax a determination response within 5 business days.   Will route to [place] for follow up.

## 2017-12-13 NOTE — Telephone Encounter (Signed)
Patient states having phone issues and asks if she does not pick up first try, to call her right back.

## 2017-12-13 NOTE — Telephone Encounter (Signed)
Patient is aware nothing further needed. 

## 2017-12-16 NOTE — Telephone Encounter (Signed)
She needs to go to the ED

## 2017-12-16 NOTE — Telephone Encounter (Signed)
Called and spoke with pt.  Pt states she was prescribed doxy 100 on 12/13/17. Pt states she has not had any improvement with abx.  Pt states she is still developing chills every evening, sweats, sob with exertion and at rest, prod cough with yellow mucus, and nausea.  Pt states she developed jaw pain into right side of chest this morning. Discomfort lasted for 45-60 minutes.  Denies arm discomfort.  RB please advise. Thanks

## 2017-12-16 NOTE — Telephone Encounter (Signed)
Pt is still getting night chills every evening. SOB, nausea   [phone]

## 2017-12-16 NOTE — Telephone Encounter (Signed)
Pt is aware of below message.  Pt refused ED, as she is not able to go to ED and wait.  Pt states she will wait one more day to see if symptoms improve.   Will route to RB as an [place].

## 2017-12-18 NOTE — Telephone Encounter (Signed)
Checked PA status via CMM.  Per CMM determination will be faxed to our office within 1-5 business days. I have checked RB's cubby and was not able to locate PA determination. PA started 12/13/17.  Will route to [place] to f/u on in the next couple of days

## 2017-12-19 NOTE — Telephone Encounter (Signed)
Checked CMM for update  Sent to [place] March 15  Next Steps  The plan will fax you a determination, typically within 1 to 5 business days.  [facility] Diskus 250-50MCG/DOSE aerosol powder  Alisha Fisher Medicare Coverage Determination FormPrior Authorization for Medicare [phone]  Original Claim [phone]

## 2017-12-20 NOTE — Telephone Encounter (Signed)
Called and spoke to insurance company to check for prior authorization. Insurance company stated that [employer] did not need a PA. The insurance company stated that they see a pharmacy error message of rejection error DUR and recommended that I contact the pharmacy. I called the patient's pharmacy and the pharmacist saw the error code and stated they were able to run the medication through. Medication will be filled without any issue.  Nothing further is needed at this time.

## 2018-01-02 ENCOUNTER — Ambulatory Visit: Payer: Medicare Other | Admitting: Emergency Medicine

## 2018-01-13 ENCOUNTER — Inpatient Hospital Stay: Payer: Medicare Other | Admitting: Hematology & Oncology

## 2018-01-13 ENCOUNTER — Inpatient Hospital Stay: Payer: Medicare Other

## 2018-01-27 ENCOUNTER — Encounter: Payer: Self-pay | Admitting: Hematology & Oncology

## 2018-01-27 ENCOUNTER — Other Ambulatory Visit: Payer: Self-pay

## 2018-01-27 ENCOUNTER — Inpatient Hospital Stay: Payer: Medicare Other | Attending: Hematology & Oncology

## 2018-01-27 ENCOUNTER — Inpatient Hospital Stay (HOSPITAL_BASED_OUTPATIENT_CLINIC_OR_DEPARTMENT_OTHER): Payer: Medicare Other | Admitting: Hematology & Oncology

## 2018-01-27 VITALS — BP 127/85 | HR 89 | Temp 98.5°F | Resp 17 | Wt 236.0 lb

## 2018-01-27 DIAGNOSIS — R51 Headache: Secondary | ICD-10-CM

## 2018-01-27 DIAGNOSIS — C771 Secondary and unspecified malignant neoplasm of intrathoracic lymph nodes: Secondary | ICD-10-CM

## 2018-01-27 DIAGNOSIS — R5381 Other malaise: Secondary | ICD-10-CM

## 2018-01-27 DIAGNOSIS — R5383 Other fatigue: Secondary | ICD-10-CM | POA: Diagnosis not present

## 2018-01-27 DIAGNOSIS — Z923 Personal history of irradiation: Secondary | ICD-10-CM | POA: Diagnosis not present

## 2018-01-27 DIAGNOSIS — D500 Iron deficiency anemia secondary to blood loss (chronic): Secondary | ICD-10-CM | POA: Insufficient documentation

## 2018-01-27 DIAGNOSIS — Z79899 Other long term (current) drug therapy: Secondary | ICD-10-CM | POA: Diagnosis not present

## 2018-01-27 DIAGNOSIS — I10 Essential (primary) hypertension: Secondary | ICD-10-CM

## 2018-01-27 DIAGNOSIS — J449 Chronic obstructive pulmonary disease, unspecified: Secondary | ICD-10-CM | POA: Diagnosis not present

## 2018-01-27 DIAGNOSIS — C3412 Malignant neoplasm of upper lobe, left bronchus or lung: Secondary | ICD-10-CM | POA: Diagnosis present

## 2018-01-27 DIAGNOSIS — M549 Dorsalgia, unspecified: Secondary | ICD-10-CM

## 2018-01-27 DIAGNOSIS — C7801 Secondary malignant neoplasm of right lung: Secondary | ICD-10-CM | POA: Insufficient documentation

## 2018-01-27 DIAGNOSIS — C3491 Malignant neoplasm of unspecified part of right bronchus or lung: Secondary | ICD-10-CM

## 2018-01-27 LAB — CMP (CANCER CENTER ONLY)
ALBUMIN: 2.7 g/dL — AB (ref 3.5–5.0)
ALT: 38 U/L (ref 0–55)
ANION GAP: 11 (ref 3–11)
AST: 33 U/L (ref 5–34)
Alkaline Phosphatase: 116 U/L (ref 40–150)
BUN: 15 mg/dL (ref 7–26)
CALCIUM: 9.8 mg/dL (ref 8.4–10.4)
CHLORIDE: 91 mmol/L — AB (ref 98–109)
CO2: 32 mmol/L — AB (ref 22–29)
Creatinine: 0.95 mg/dL (ref 0.60–1.10)
GFR, Est AFR Am: >60 mL/min (ref >60)
GFR, Estimated: 59 mL/min — ABNORMAL LOW (ref >60)
GLUCOSE: 104 mg/dL (ref 70–140)
Potassium: 4.5 mmol/L (ref 3.5–5.1)
SODIUM: 134 mmol/L — AB (ref 136–145)
Total Bilirubin: 0.6 mg/dL (ref 0.2–1.2)
Total Protein: 7.8 g/dL (ref 6.4–8.3)

## 2018-01-27 LAB — CBC WITH DIFFERENTIAL (CANCER CENTER ONLY)
BASOS PCT: 0 %
Basophils Absolute: 0 10*3/uL (ref 0.0–0.1)
EOS ABS: 0 10*3/uL (ref 0.0–0.5)
Eosinophils Relative: 0 %
HCT: 34.3 % — ABNORMAL LOW (ref 34.8–46.6)
Hemoglobin: 10.8 g/dL — ABNORMAL LOW (ref 11.6–15.9)
Lymphocytes Relative: 3 %
Lymphs Abs: 0.5 10*3/uL — ABNORMAL LOW (ref 0.9–3.3)
MCH: 29.4 pg (ref 26.0–34.0)
MCHC: 31.5 g/dL — ABNORMAL LOW (ref 32.0–36.0)
MCV: 93.5 fL (ref 81.0–101.0)
Monocytes Absolute: 0.4 10*3/uL (ref 0.1–0.9)
Monocytes Relative: 2 %
NEUTROS PCT: 95 %
Neutro Abs: 15.2 10*3/uL — ABNORMAL HIGH (ref 1.5–6.5)
PLATELETS: 427 10*3/uL — AB (ref 145–400)
RBC: 3.67 MIL/uL — AB (ref 3.70–5.32)
RDW: 14.2 % (ref 11.1–15.7)
WBC: 16.2 10*3/uL — AB (ref 3.9–10.0)

## 2018-01-27 LAB — LACTATE DEHYDROGENASE: LDH: 226 U/L (ref 125–245)

## 2018-01-27 NOTE — Progress Notes (Signed)
Hematology and Oncology Follow Up Visit  Alisha Fisher [id number] 12-08-1945 72 y.o. 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  She did have a bronchoscopy with endoscopic ultrasound.  This was done on March 12.  For some reason, I was never given the path report.  However, the path report ([id number]) shows squamous cell carcinoma.  Looks like there was quite a lot of material that was obtained.  I am going to send material off for genetic testing.  She just is not feeling well.  She does not have a lot of stamina.  Her sister, comes in with her, says that she mostly is lies around.  I am not sure why this is.  She is not coughing up any blood.  She has had no fever.  She has had no nausea or vomiting.  There is been no leg swelling.  She has had no change in bowel or bladder habits.  She has had no headache.  She does have a incentive spirometer at home.  I told her that she has to use this 10 times an hour.  I do think that another PET scan is necessary.  We did give her iron back in February.  At that time, her ferritin was 229 with an iron saturation of only 15%.  Unfortunately, the iron did not make her feel all that went better.  Currently, her performance status is ECOG 2.    Medications:  Current Outpatient Medications:  .  ADVAIR DISKUS 250-50 MCG/DOSE AEPB, Inhale 1 puff into the lungs 2 (two) times daily., Disp: 60 each, Rfl: 5 .  albuterol (PROVENTIL HFA;VENTOLIN HFA) 108 (90 Base) MCG/ACT inhaler, Inhale 2 puffs into the lungs every 6 (six) hours as needed for wheezing or shortness of breath., Disp: 1 Inhaler, Rfl: 1 .  amLODipine (NORVASC) 10 MG tablet, Take 1 tablet (10 mg  total) daily by mouth., Disp: 10 tablet, Rfl: 0 .  clonazePAM (KLONOPIN) 0.5 MG tablet, Take 1 tablet (0.5 mg total) by mouth 2 (two) times daily as needed for anxiety., Disp: 60 tablet, Rfl: 2 .  diphenhydrAMINE (BENADRYL) 25 mg capsule, Take 25 mg by mouth every 4 (four) hours as needed for itching or allergies., Disp: , Rfl:  .  doxycycline (VIBRA-TABS) 100 MG tablet, Take 1 tablet (100 mg total) by mouth 2 (two) times daily., Disp: 14 tablet, Rfl: 0 .  fluticasone (FLONASE) 50 MCG/ACT nasal spray, Place 2 sprays into both nostrils daily as needed for allergies., Disp: , Rfl:  .  loratadine (CLARITIN) 10 MG tablet, Take 1 tablet (10 mg total) by mouth daily as needed for allergies., Disp: , Rfl:  .  losartan-hydrochlorothiazide (HYZAAR) 100-25 MG tablet, Take 1 tablet daily by mouth., Disp: 90 tablet, Rfl: 1 .  metoprolol succinate (TOPROL-XL) 100 MG 24 hr tablet, Take 2 tablets (200 mg total) by mouth daily., Disp: , Rfl:  .  predniSONE (DELTASONE) 10 MG tablet, Take 10 mg by mouth daily with breakfast., Disp: , Rfl:  .  senna-docusate (SENOKOT-S) 8.6-50 MG tablet, Take 1 tablet by mouth daily., Disp: , Rfl:   Allergies:  Allergies  Allergen Reactions  . Ciprofloxacin Rash and Other (See  Comments)    Rash possibly caused by Cipro?  . Flagyl [Metronidazole] Rash and Other (See Comments)    Rash possibly caused by Flagyl?  Marland Kitchen Zosyn [Piperacillin Sod-Tazobactam So] Itching, Rash and Other (See Comments)    Has patient had a PCN reaction causing immediate rash, facial/tongue/throat swelling, SOB or lightheadedness with hypotension: No Has patient had a PCN reaction causing severe rash involving mucus membranes or skin necrosis: No Has patient had a PCN reaction that required hospitalization: No Has patient had a PCN reaction occurring within the last 10 years: Yes If all of the above answers are "NO", then may proceed with Cephalosporin use.     Past Medical History, Surgical history,  Social history, and Family History were reviewed and updated.  Review of Systems: Review of Systems  Constitutional: Positive for malaise/fatigue.  HENT: Positive for congestion.   Eyes: Negative.   Respiratory: Positive for cough, shortness of breath and wheezing.   Cardiovascular: Positive for palpitations.  Gastrointestinal: Positive for diarrhea and heartburn.  Genitourinary: Negative.   Musculoskeletal: Positive for back pain and joint pain.  Skin: Negative.   Neurological: Positive for headaches.  Endo/Heme/Allergies: Negative.   Psychiatric/Behavioral: Negative.      Physical Exam:  weight is 236 lb (107 kg). Her oral temperature is 98.5 F (36.9 C). Her blood pressure is 127/85 and her pulse is 89. Her respiration is 17 and oxygen saturation is 93%.   Wt Readings from Last 3 Encounters:  01/27/18 236 lb (107 kg)  12/10/17 239 lb (108.4 kg)  12/05/17 239 lb (108.4 kg)     Physical Exam  Constitutional: She is oriented to person, place, and time.  HENT:  Head: Normocephalic and atraumatic.  Mouth/Throat: Oropharynx is clear and moist.  Eyes: Pupils are equal, round, and reactive to light. EOM are normal.  Neck: Normal range of motion.  Cardiovascular: Normal rate, regular rhythm and normal heart sounds.  Pulmonary/Chest: Effort normal and breath sounds normal.  Abdominal: Soft. Bowel sounds are normal.  Musculoskeletal: Normal range of motion. She exhibits no edema, tenderness or deformity.  Lymphadenopathy:    She has no cervical adenopathy.  Neurological: She is alert and oriented to person, place, and time.  Skin: Skin is warm and dry. No rash noted. No erythema.  Psychiatric: She has a normal mood and affect. Her behavior is normal. Judgment and thought content normal.  Vitals reviewed.   Lab Results  Component Value Date   WBC 16.2 (H) 01/27/2018   HGB 10.8 (L) 01/27/2018   HCT 34.3 (L) 01/27/2018   MCV 93.5 01/27/2018   PLT 427 (H) 01/27/2018      Chemistry      Component Value Date/Time   NA 136 12/10/2017 0635   NA 140 09/06/2017 1418   NA 137 01/24/2017 1143   K 4.1 12/10/2017 0635   K 4.0 09/06/2017 1418   K 4.7 01/24/2017 1143   CL 98 (L) 12/10/2017 0635   CL 98 09/06/2017 1418   CO2 27 12/10/2017 0635   CO2 30 09/06/2017 1418   CO2 30 (H) 01/24/2017 1143   BUN 16 12/10/2017 0635   BUN 19 09/06/2017 1418   BUN 15.4 08/07/2017 1500   CREATININE 0.84 12/10/2017 0635   CREATININE 0.86 11/05/2017 1342   CREATININE 0.9 09/06/2017 1418   CREATININE 0.8 08/07/2017 1500      Component Value Date/Time   CALCIUM 8.7 (L) 12/10/2017 0635   CALCIUM 9.1 09/06/2017 1418   CALCIUM 9.7 01/24/2017  1143   ALKPHOS 95 11/05/2017 1342   ALKPHOS 90 (H) 09/06/2017 1418   ALKPHOS 90 01/24/2017 1143   AST 10 11/05/2017 1342   AST 12 01/24/2017 1143   ALT 12 11/05/2017 1342   ALT 17 09/06/2017 1418   ALT 10 01/24/2017 1143   BILITOT 0.6 11/05/2017 1342   BILITOT 0.39 01/24/2017 1143         Impression and Plan: Ms. Garman is a 72 year old white female. She has severe underlying COPD. Her FEV1 is 1.14.  Unfortunately, it looks like we are dealing with recurrence.  This is to be a real problem.  At this point, we now have a confirmed diagnosis.  We will have to see what the genetic studies show.  Hopefully, we can use immunotherapy for this.  I am not sure if we can use any further radiation therapy.  I spent about 45 minutes with Ms. Stann Mainland and her sister.  This is somewhat complicated.  We will see what her iron studies show.  I probably will plan to get her back in about 3 weeks time.  By then, I will have had all of my studies that I need from the biopsies.   Volanda Napoleon, MD 4/29/20191:54 PM

## 2018-01-28 ENCOUNTER — Telehealth: Payer: Self-pay | Admitting: *Deleted

## 2018-01-28 LAB — IRON AND TIBC
IRON: 24 ug/dL — AB (ref 41–142)
Saturation Ratios: 12 % — ABNORMAL LOW (ref 21–57)
TIBC: 201 ug/dL — AB (ref 236–444)
UIBC: 177 ug/dL

## 2018-01-28 LAB — TSH: TSH: 0.606 u[IU]/mL (ref 0.308–3.960)

## 2018-01-28 LAB — FERRITIN: Ferritin: 735 ng/mL — ABNORMAL HIGH (ref 9–269)

## 2018-01-28 NOTE — Telephone Encounter (Addendum)
Patient is aware of results. Message sent to scheduler.   ----- Message from Volanda Napoleon, MD sent at 01/28/2018 11:17 AM EDT ----- Call - iron is still quite low!!  Need to come in for IV iron this or next week. [name]

## 2018-01-31 ENCOUNTER — Ambulatory Visit (HOSPITAL_COMMUNITY): Payer: Medicare Other

## 2018-01-31 ENCOUNTER — Ambulatory Visit: Payer: Medicare Other | Admitting: Emergency Medicine

## 2018-01-31 ENCOUNTER — Other Ambulatory Visit (HOSPITAL_COMMUNITY): Payer: Medicare Other

## 2018-02-04 ENCOUNTER — Inpatient Hospital Stay: Payer: Medicare Other | Attending: Hematology & Oncology

## 2018-02-04 ENCOUNTER — Other Ambulatory Visit: Payer: Self-pay | Admitting: Family

## 2018-02-04 ENCOUNTER — Other Ambulatory Visit: Payer: Self-pay

## 2018-02-04 VITALS — BP 132/55 | HR 110 | Temp 97.9°F | Resp 18

## 2018-02-04 DIAGNOSIS — R5383 Other fatigue: Secondary | ICD-10-CM | POA: Diagnosis not present

## 2018-02-04 DIAGNOSIS — R51 Headache: Secondary | ICD-10-CM | POA: Insufficient documentation

## 2018-02-04 DIAGNOSIS — R05 Cough: Secondary | ICD-10-CM | POA: Insufficient documentation

## 2018-02-04 DIAGNOSIS — Z5112 Encounter for antineoplastic immunotherapy: Secondary | ICD-10-CM | POA: Insufficient documentation

## 2018-02-04 DIAGNOSIS — D508 Other iron deficiency anemias: Secondary | ICD-10-CM

## 2018-02-04 DIAGNOSIS — R5381 Other malaise: Secondary | ICD-10-CM | POA: Insufficient documentation

## 2018-02-04 DIAGNOSIS — C787 Secondary malignant neoplasm of liver and intrahepatic bile duct: Secondary | ICD-10-CM | POA: Insufficient documentation

## 2018-02-04 DIAGNOSIS — R1111 Vomiting without nausea: Secondary | ICD-10-CM | POA: Insufficient documentation

## 2018-02-04 DIAGNOSIS — M549 Dorsalgia, unspecified: Secondary | ICD-10-CM | POA: Insufficient documentation

## 2018-02-04 DIAGNOSIS — Z79899 Other long term (current) drug therapy: Secondary | ICD-10-CM | POA: Insufficient documentation

## 2018-02-04 DIAGNOSIS — J449 Chronic obstructive pulmonary disease, unspecified: Secondary | ICD-10-CM | POA: Insufficient documentation

## 2018-02-04 DIAGNOSIS — R590 Localized enlarged lymph nodes: Secondary | ICD-10-CM | POA: Diagnosis not present

## 2018-02-04 DIAGNOSIS — D500 Iron deficiency anemia secondary to blood loss (chronic): Secondary | ICD-10-CM | POA: Insufficient documentation

## 2018-02-04 DIAGNOSIS — C3412 Malignant neoplasm of upper lobe, left bronchus or lung: Secondary | ICD-10-CM | POA: Diagnosis present

## 2018-02-04 MED ORDER — SODIUM CHLORIDE 0.9 % IV SOLN
510.0000 mg | Freq: Once | INTRAVENOUS | Status: AC
  Administered 2018-02-04: 510 mg via INTRAVENOUS
  Filled 2018-02-04: qty 17

## 2018-02-04 NOTE — Patient Instructions (Signed)

## 2018-02-07 ENCOUNTER — Encounter (HOSPITAL_COMMUNITY)
Admission: RE | Admit: 2018-02-07 | Discharge: 2018-02-07 | Disposition: A | Discharge status: Home / Self Care | Payer: Medicare Other | Source: Ambulatory Visit | Attending: Hematology & Oncology | Admitting: Hematology & Oncology

## 2018-02-07 ENCOUNTER — Other Ambulatory Visit (HOSPITAL_COMMUNITY): Payer: Medicare Other

## 2018-02-07 DIAGNOSIS — I10 Essential (primary) hypertension: Secondary | ICD-10-CM | POA: Insufficient documentation

## 2018-02-07 DIAGNOSIS — C3412 Malignant neoplasm of upper lobe, left bronchus or lung: Secondary | ICD-10-CM

## 2018-02-07 LAB — GLUCOSE, CAPILLARY: Glucose-Capillary: 117 mg/dL — ABNORMAL HIGH (ref 65–99)

## 2018-02-07 MED ORDER — FLUDEOXYGLUCOSE F - 18 (FDG) INJECTION
11.2000 | Freq: Once | INTRAVENOUS | Status: AC | PRN
  Administered 2018-02-07: 11.2 via INTRAVENOUS

## 2018-02-11 ENCOUNTER — Encounter (HOSPITAL_COMMUNITY): Payer: Self-pay | Admitting: Hematology & Oncology

## 2018-02-12 ENCOUNTER — Encounter: Payer: Self-pay | Admitting: Hematology & Oncology

## 2018-02-12 ENCOUNTER — Other Ambulatory Visit: Payer: Self-pay | Admitting: Hematology & Oncology

## 2018-02-12 DIAGNOSIS — Z7189 Other specified counseling: Secondary | ICD-10-CM | POA: Insufficient documentation

## 2018-02-12 DIAGNOSIS — C3412 Malignant neoplasm of upper lobe, left bronchus or lung: Secondary | ICD-10-CM

## 2018-02-12 HISTORY — DX: Other specified counseling: Z71.89

## 2018-02-12 NOTE — Progress Notes (Signed)
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 50% (TPS) AJCC T Category: T2a Current Disease Status: Distant Metastases AJCC N Category: N3 AJCC M Category: M1a AJCC 8 Stage Grouping: IVA Histology: Squamous Cell Line of therapy: First Line PD-L1 Expression Status: PD-L1 Positive ? 50% (TPS) Performance Status: PS = 2  Intent of Therapy: Non-Curative / Palliative Intent, Discussed with Patient

## 2018-02-15 IMAGING — MR MR HEAD WO/W CM
11 of 14 series · 32 of 48 positions shown · IV contrast (multihance)
Comparison: None.

CLINICAL DATA: 71 y/o F; lung cancer, evaluate for intracranial
metastatic disease.

EXAM:
MRI HEAD WITHOUT AND WITH CONTRAST
TECHNIQUE: Multiplanar, multiecho pulse sequences of the brain and surrounding
structures were obtained without and with intravenous contrast.
CONTRAST:  20mL MULTIHANCE GADOBENATE DIMEGLUMINE 529 MG/ML IV SOLN

[Series 3: T1 · sagittal · 5.0mm · 0.47mm/px · 2 of 24 slices shown]
[im 1/24]
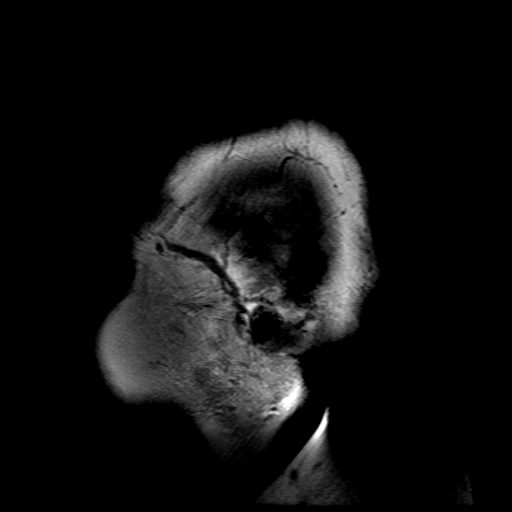
[im 24/24]
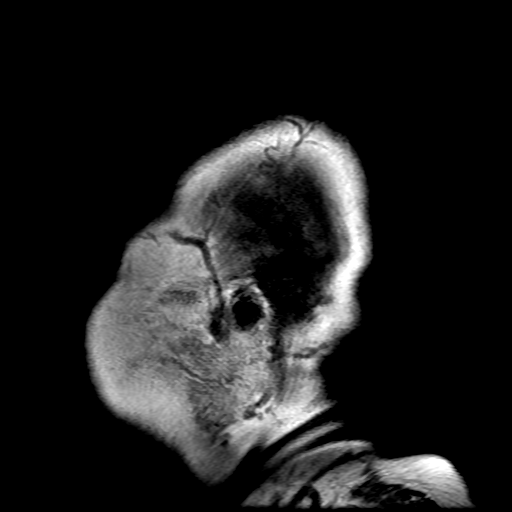

[Series 4: DWI · axial · 3.0mm · 1.09mm/px · z∈[-54,+96]mm · 8 of 102 slices shown (1 of 4)]
[im 1/102]
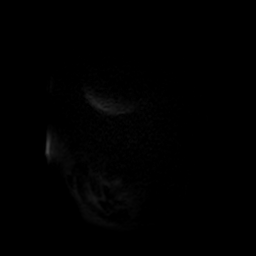
[im 15/102]
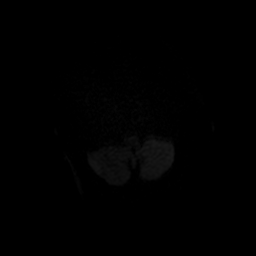
[im 29/102]
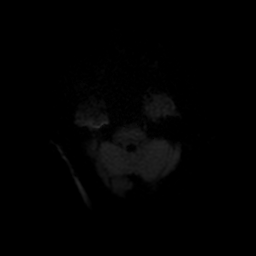
[im 44/102]
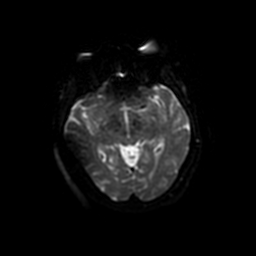
[im 58/102]
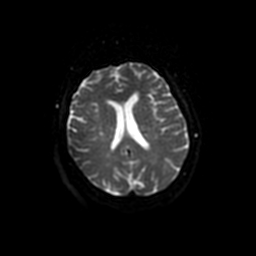
[im 73/102]
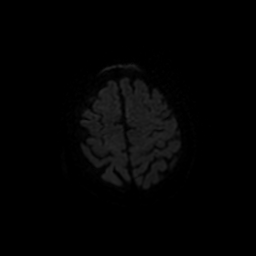
[im 87/102]
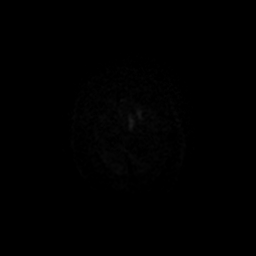
[im 102/102]
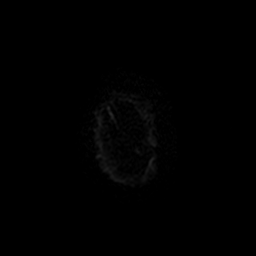

[Series 5: DWI · coronal · 5.0mm · 1.09mm/px · 5 of 62 slices shown (2 of 4)]
[im 1/62]
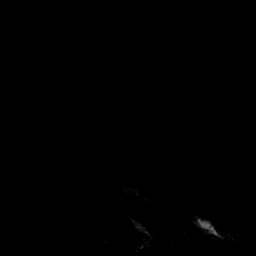
[im 16/62]
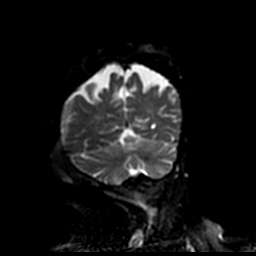
[im 31/62]
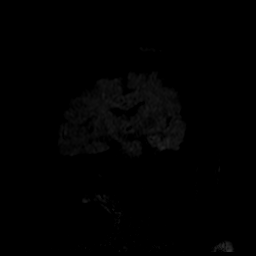
[im 46/62]
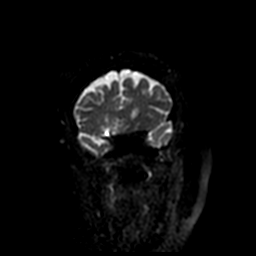
[im 62/62]
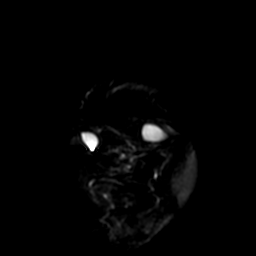

[Series 6: T2 · axial · 5.0mm · 0.43mm/px · z∈[-49,+100]mm · 2 of 26 slices shown]
[im 1/26]
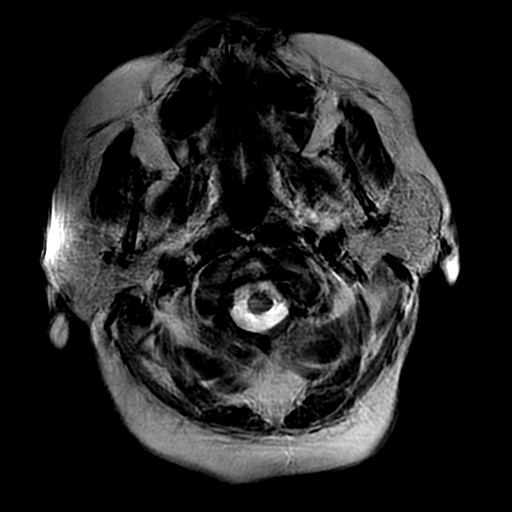
[im 26/26]
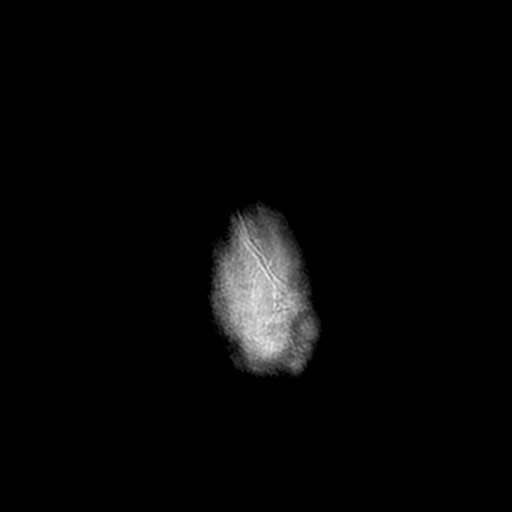

[Series 7: FLAIR · axial · 3.0mm · 0.43mm/px · z∈[-49,+100]mm · 2 of 26 slices shown]
[im 1/26]
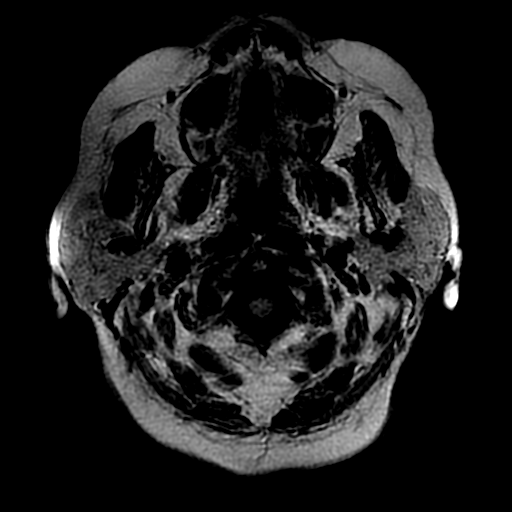
[im 26/26]
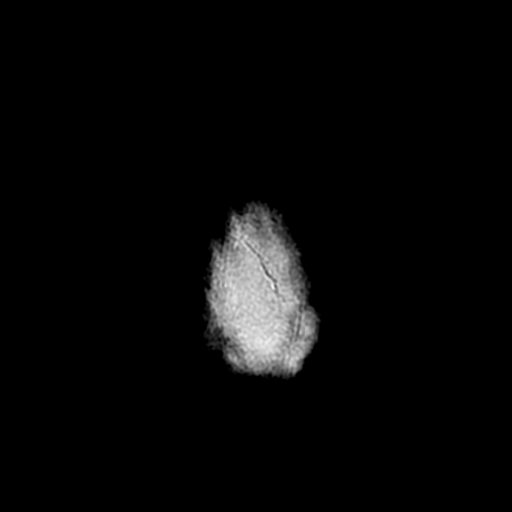

[Series 8: fast ax mpgr · axial · 5.0mm · 0.43mm/px · 1 of 26 slices shown]
[im 1/26]
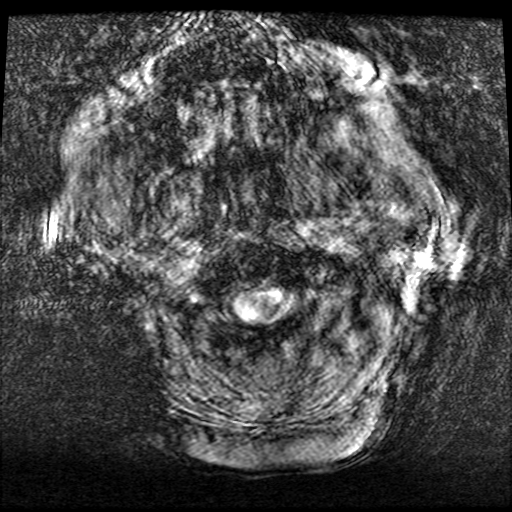

[Series 11: T2 post-contrast · coronal · 5.0mm · 0.45mm/px · 2 of 26 slices shown]
[im 1/26]
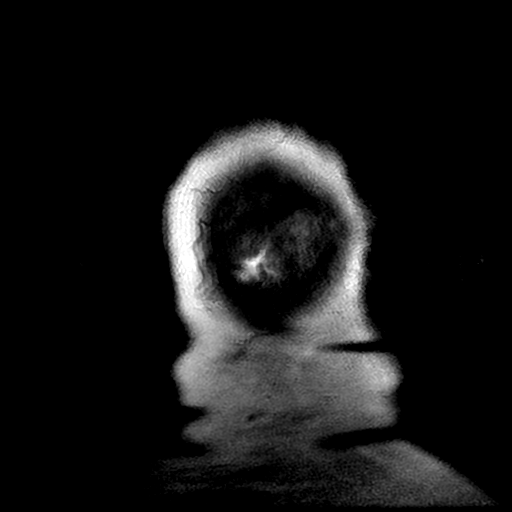
[im 26/26]
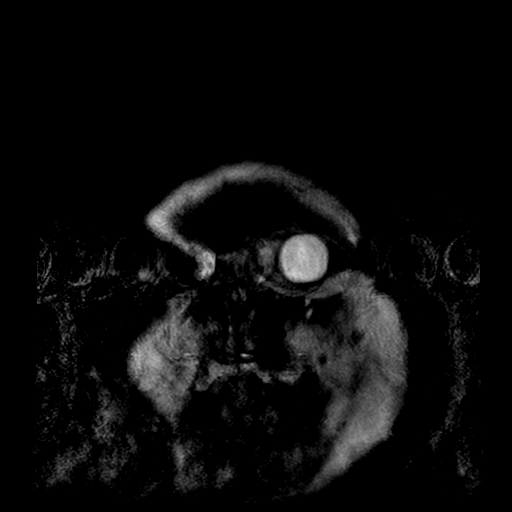

[Series 13: T1 post-contrast · coronal · 5.0mm · 0.45mm/px · 2 of 24 slices shown (1 of 2)]
[im 1/24]
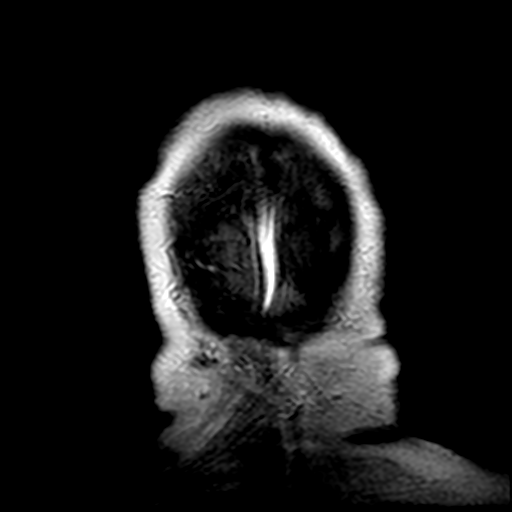
[im 24/24]
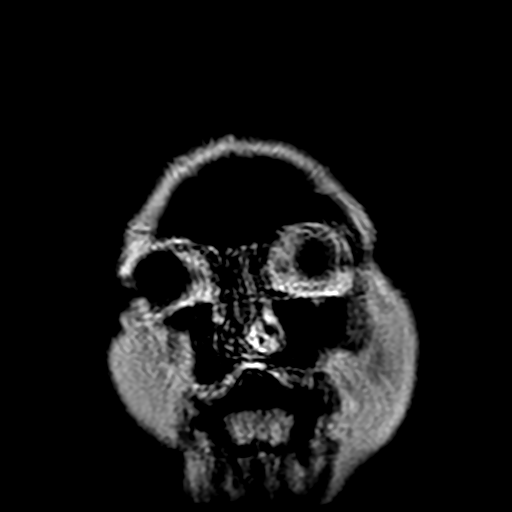

[Series 14: T1 post-contrast · sagittal · 5.0mm · 0.47mm/px · 2 of 24 slices shown (2 of 2)]
[im 1/24]
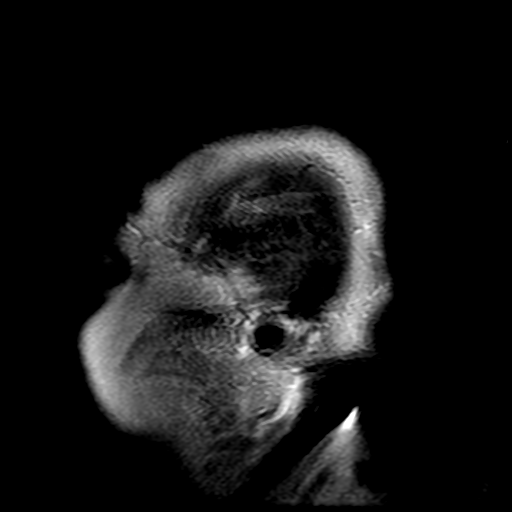
[im 24/24]
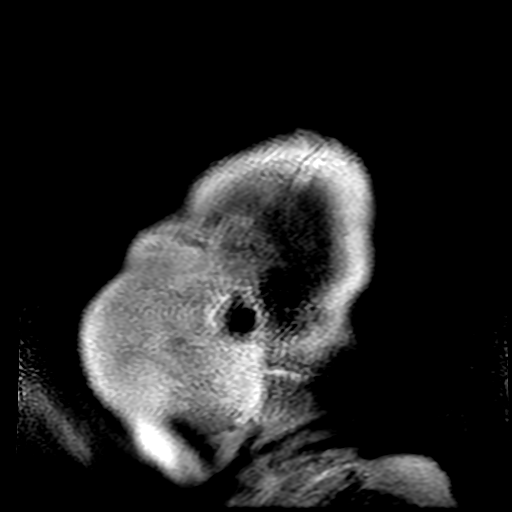

[Series 400: DWI · axial · 3.0mm · 1.09mm/px · z∈[-54,+96]mm · 4 of 51 slices shown (3 of 4)]
[im 1/51]
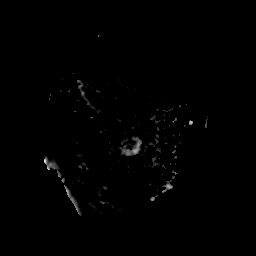
[im 17/51]
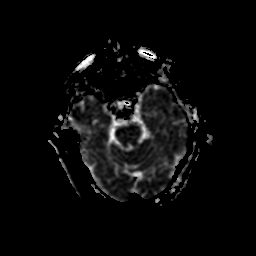
[im 34/51]
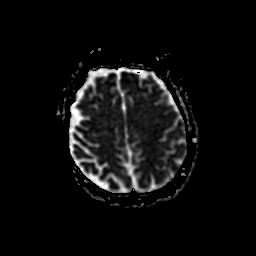
[im 51/51]
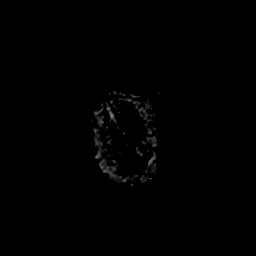

[Series 500: DWI · coronal · 5.0mm · 1.09mm/px · 2 of 31 slices shown (4 of 4)]
[im 1/31]
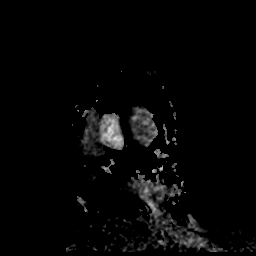
[im 31/31]
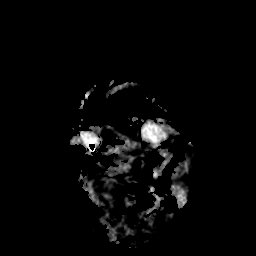

[32 of 48 positions shown; findings below may reference images not displayed]

FINDINGS: Moderate to severe motion artifact of all sequences due to patient
confusion.

Brain: No acute infarction, hemorrhage, hydrocephalus, extra-axial
collection or mass lesion. No abnormal enhancement identified. Foci
of T2 FLAIR hyperintense signal abnormality in subcortical and
periventricular white matter are nonspecific but compatible with
mild chronic microvascular ischemic changes. Mild brain parenchymal
volume loss.

Vascular: Normal flow voids.

Skull and upper cervical spine: Normal marrow signal.

Sinuses/Orbits: Small left maxillary sinus mucous retention cyst.
Left mastoid air cell effusion. Bilateral intra-ocular lens
replacement.

Other: None.
IMPRESSION: 1. Moderate to severe motion artifact. If clinically indicated,
short interval follow-up when patient is better able to tolerate
examination is recommended.
2. No definite evidence of intracranial metastatic disease.
3. Mild chronic microvascular ischemic changes and mild parenchymal
volume loss of the brain.

By: Indrik Few M.D.

## 2018-02-19 ENCOUNTER — Inpatient Hospital Stay: Payer: Medicare Other

## 2018-02-19 ENCOUNTER — Inpatient Hospital Stay (HOSPITAL_BASED_OUTPATIENT_CLINIC_OR_DEPARTMENT_OTHER): Payer: Medicare Other | Admitting: Hematology & Oncology

## 2018-02-19 ENCOUNTER — Other Ambulatory Visit: Payer: Self-pay

## 2018-02-19 ENCOUNTER — Encounter: Payer: Self-pay | Admitting: Hematology & Oncology

## 2018-02-19 VITALS — BP 144/56 | HR 87 | Temp 98.1°F | Resp 20

## 2018-02-19 DIAGNOSIS — C7801 Secondary malignant neoplasm of right lung: Secondary | ICD-10-CM

## 2018-02-19 DIAGNOSIS — R5383 Other fatigue: Secondary | ICD-10-CM | POA: Diagnosis not present

## 2018-02-19 DIAGNOSIS — Z5112 Encounter for antineoplastic immunotherapy: Secondary | ICD-10-CM | POA: Diagnosis not present

## 2018-02-19 DIAGNOSIS — R05 Cough: Secondary | ICD-10-CM | POA: Diagnosis not present

## 2018-02-19 DIAGNOSIS — C3412 Malignant neoplasm of upper lobe, left bronchus or lung: Secondary | ICD-10-CM

## 2018-02-19 DIAGNOSIS — C787 Secondary malignant neoplasm of liver and intrahepatic bile duct: Secondary | ICD-10-CM

## 2018-02-19 DIAGNOSIS — C3491 Malignant neoplasm of unspecified part of right bronchus or lung: Secondary | ICD-10-CM

## 2018-02-19 DIAGNOSIS — R51 Headache: Secondary | ICD-10-CM | POA: Diagnosis not present

## 2018-02-19 DIAGNOSIS — R1111 Vomiting without nausea: Secondary | ICD-10-CM | POA: Diagnosis not present

## 2018-02-19 DIAGNOSIS — J449 Chronic obstructive pulmonary disease, unspecified: Secondary | ICD-10-CM | POA: Diagnosis not present

## 2018-02-19 DIAGNOSIS — E039 Hypothyroidism, unspecified: Secondary | ICD-10-CM

## 2018-02-19 DIAGNOSIS — R590 Localized enlarged lymph nodes: Secondary | ICD-10-CM

## 2018-02-19 DIAGNOSIS — Z79899 Other long term (current) drug therapy: Secondary | ICD-10-CM

## 2018-02-19 DIAGNOSIS — C771 Secondary and unspecified malignant neoplasm of intrathoracic lymph nodes: Secondary | ICD-10-CM

## 2018-02-19 DIAGNOSIS — R5381 Other malaise: Secondary | ICD-10-CM

## 2018-02-19 DIAGNOSIS — D500 Iron deficiency anemia secondary to blood loss (chronic): Secondary | ICD-10-CM | POA: Diagnosis not present

## 2018-02-19 DIAGNOSIS — M549 Dorsalgia, unspecified: Secondary | ICD-10-CM

## 2018-02-19 DIAGNOSIS — Z7189 Other specified counseling: Secondary | ICD-10-CM

## 2018-02-19 LAB — CBC WITH DIFFERENTIAL (CANCER CENTER ONLY)
Basophils Absolute: 0 10*3/uL (ref 0.0–0.1)
Basophils Relative: 0 %
EOS ABS: 0.1 10*3/uL (ref 0.0–0.5)
EOS PCT: 1 %
HEMATOCRIT: 35.3 % (ref 34.8–46.6)
HEMOGLOBIN: 10.9 g/dL — AB (ref 11.6–15.9)
LYMPHS ABS: 0.5 10*3/uL — AB (ref 0.9–3.3)
Lymphocytes Relative: 3 %
MCH: 29.6 pg (ref 26.0–34.0)
MCHC: 30.9 g/dL — ABNORMAL LOW (ref 32.0–36.0)
MCV: 95.9 fL (ref 81.0–101.0)
Monocytes Absolute: 0.4 10*3/uL (ref 0.1–0.9)
Monocytes Relative: 3 %
Neutro Abs: 14.1 10*3/uL — ABNORMAL HIGH (ref 1.5–6.5)
Neutrophils Relative %: 93 %
Platelet Count: 406 10*3/uL — ABNORMAL HIGH (ref 145–400)
RBC: 3.68 MIL/uL — AB (ref 3.70–5.32)
RDW: 15.2 % (ref 11.1–15.7)
WBC Count: 15.2 10*3/uL — ABNORMAL HIGH (ref 3.9–10.0)

## 2018-02-19 LAB — CMP (CANCER CENTER ONLY)
ALBUMIN: 2.9 g/dL — AB (ref 3.5–5.0)
ALT: 32 U/L (ref 10–47)
AST: 19 U/L (ref 11–38)
Alkaline Phosphatase: 97 U/L — ABNORMAL HIGH (ref 26–84)
Anion gap: 9 (ref 5–15)
BILIRUBIN TOTAL: 0.7 mg/dL (ref 0.2–1.6)
BUN: 15 mg/dL (ref 7–22)
CALCIUM: 9.4 mg/dL (ref 8.0–10.3)
CO2: 34 mmol/L — ABNORMAL HIGH (ref 18–33)
CREATININE: 1 mg/dL (ref 0.60–1.20)
Chloride: 94 mmol/L — ABNORMAL LOW (ref 98–108)
Glucose, Bld: 113 mg/dL (ref 73–118)
Potassium: 4.5 mmol/L (ref 3.3–4.7)
SODIUM: 137 mmol/L (ref 128–145)
Total Protein: 7.6 g/dL (ref 6.4–8.1)

## 2018-02-19 LAB — LACTATE DEHYDROGENASE: LDH: 223 U/L (ref 125–245)

## 2018-02-19 MED ORDER — SODIUM CHLORIDE 0.9 % IV SOLN
Freq: Once | INTRAVENOUS | Status: AC
  Administered 2018-02-19: 15:00:00 via INTRAVENOUS

## 2018-02-19 MED ORDER — ONDANSETRON HCL 8 MG PO TABS: 8.0000 mg | ORAL_TABLET | Freq: Two times a day (BID) | ORAL | 1 refills | Status: DC | PRN

## 2018-02-19 MED ORDER — SODIUM CHLORIDE 0.9% FLUSH
10.0000 mL | INTRAVENOUS | Status: DC | PRN
  Filled 2018-02-19: qty 10

## 2018-02-19 MED ORDER — PANTOPRAZOLE SODIUM 40 MG PO TBEC: 40.0000 mg | DELAYED_RELEASE_TABLET | Freq: Two times a day (BID) | ORAL | 3 refills | Status: AC

## 2018-02-19 MED ORDER — PROCHLORPERAZINE MALEATE 10 MG PO TABS: 10.0000 mg | ORAL_TABLET | Freq: Four times a day (QID) | ORAL | 1 refills | Status: AC | PRN

## 2018-02-19 MED ORDER — HEPARIN SOD (PORK) LOCK FLUSH 100 UNIT/ML IV SOLN
500.0000 [IU] | Freq: Once | INTRAVENOUS | Status: DC | PRN
  Filled 2018-02-19: qty 5

## 2018-02-19 MED ORDER — HYDROCODONE-HOMATROPINE 5-1.5 MG/5ML PO SYRP: 5.0000 mL | ORAL_SOLUTION | Freq: Four times a day (QID) | ORAL | 0 refills | Status: DC | PRN

## 2018-02-19 MED ORDER — PEMBROLIZUMAB CHEMO INJECTION 100 MG/4ML
200.0000 mg | Freq: Once | INTRAVENOUS | Status: AC
  Administered 2018-02-19: 200 mg via INTRAVENOUS
  Filled 2018-02-19: qty 8

## 2018-02-19 NOTE — Addendum Note (Signed)
Addended by: Volanda Napoleon on: 02/19/2018 05:26 PM   Modules accepted: Orders

## 2018-02-19 NOTE — Patient Instructions (Signed)
[facility] Discharge Instructions for Patients Receiving Chemotherapy  Today you received the following chemotherapy agents Keytruda.  To help prevent nausea and vomiting after your treatment, we encourage you to take your nausea medication as prescribed by your MD.   If you develop nausea and vomiting that is not controlled by your nausea medication, call the clinic.   BELOW ARE SYMPTOMS THAT SHOULD BE REPORTED IMMEDIATELY:  *FEVER GREATER THAN 100.5 F  *CHILLS WITH OR WITHOUT FEVER  NAUSEA AND VOMITING THAT IS NOT CONTROLLED WITH YOUR NAUSEA MEDICATION  *UNUSUAL SHORTNESS OF BREATH  *UNUSUAL BRUISING OR BLEEDING  TENDERNESS IN MOUTH AND THROAT WITH OR WITHOUT PRESENCE OF ULCERS  *URINARY PROBLEMS  *BOWEL PROBLEMS  UNUSUAL RASH Items with * indicate a potential emergency and should be followed up as soon as possible.  Feel free to call the clinic should you have any questions or concerns. The clinic phone number is [phone].  Please show the [facility] at check-in to the Emergency Department and triage nurse.

## 2018-02-19 NOTE — Progress Notes (Signed)
Hematology and Oncology Follow Up Visit  Carlyn Lemke [id number] 1945/12/17 72 y.o. 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 is in for her follow-up.  She will start chemotherapy today.  Actually, we are going to start her on immunotherapy.  We sent her tumor biopsy off for genetic analysis.  Unfortunately, there was nothing that we could target with oral therapy.  She does have a high tumor mutation burden so we will try to utilize pembrolizumab to see if this can help [place].  She did have a PET scan done.  This was done on May 10.  This showed significant progression.  The right lobe mass now measures 7.6 x 7.6 cm.  She has an adjacent nodule measuring 1.2 cm.  She has some pleural-based disease that is metabolic.  She has lymphadenopathy that is metabolic in the chest.  Thankfully, there is no disease outside the chest.  She comes in a wheelchair.  She just has health issues that make it hard for her to walk a lot.  She coughs.  She says that when she coughs, she gets sick and throws up.  She is not on any kind of antiacid.  She is taking prednisone.  I will see about calling in a PPI.  I will also try her on some Hycodan for cough.  She does not complain of any pain.  She has had no bleeding.  There is no diarrhea.  She has had no leg swelling.  Overall, her performance status is ECOG 2.  We have given her iron in the past for iron deficiency.  This is helped her a little bit.  We will recheck her iron back in late April, her ferritin was 735 with an iron saturation of only 12%.  She did get a dose of iron at that time.     Medications:  Current Outpatient Medications:  .  albuterol  (PROVENTIL HFA;VENTOLIN HFA) 108 (90 Base) MCG/ACT inhaler, Inhale 2 puffs into the lungs every 6 (six) hours as needed for wheezing or shortness of breath., Disp: 1 Inhaler, Rfl: 1 .  ANORO ELLIPTA 62.5-25 MCG/INH AEPB, , Disp: , Rfl:  .  clonazePAM (KLONOPIN) 0.5 MG tablet, Take 1 tablet (0.5 mg total) by mouth 2 (two) times daily as needed for anxiety., Disp: 60 tablet, Rfl: 2 .  diphenhydrAMINE (BENADRYL) 25 mg capsule, Take 25 mg by mouth every 4 (four) hours as needed for itching or allergies., Disp: , Rfl:  .  fluticasone (FLONASE) 50 MCG/ACT nasal spray, Place 2 sprays into both nostrils daily as needed for allergies., Disp: , Rfl:  .  losartan-hydrochlorothiazide (HYZAAR) 100-25 MG tablet, Take 1 tablet daily by mouth., Disp: 90 tablet, Rfl: 1 .  metoprolol succinate (TOPROL-XL) 100 MG 24 hr tablet, Take 2 tablets (200 mg total) by mouth daily., Disp: , Rfl:  .  predniSONE (DELTASONE) 10 MG tablet, Take 10 mg by mouth daily with breakfast., Disp: , Rfl:  .  senna-docusate (SENOKOT-S) 8.6-50 MG tablet, Take 1 tablet by mouth daily., Disp: , Rfl:  .  ADVAIR DISKUS 250-50 MCG/DOSE AEPB, Inhale 1 puff into the lungs 2 (two) times daily., Disp: 60 each, Rfl: 5 .  amLODipine (NORVASC) 10 MG tablet, Take 1 tablet (10 mg total) daily by mouth. (Patient not taking: Reported on 02/19/2018), Disp: 10 tablet, Rfl: 0 .  doxycycline (VIBRA-TABS) 100 MG tablet, Take 1 tablet (100 mg total) by mouth 2 (two) times daily. (Patient not taking: Reported on 02/04/2018), Disp: 14 tablet, Rfl: 0 .  loratadine (CLARITIN) 10 MG tablet, Take 1 tablet (10 mg total) by mouth daily as needed for allergies., Disp: , Rfl:   Allergies:  Allergies  Allergen Reactions  . Ciprofloxacin Rash and Other (See Comments)    Rash possibly caused by Cipro?  . Flagyl [Metronidazole] Rash and Other (See Comments)    Rash possibly caused by Flagyl?  Marland Kitchen Zosyn [Piperacillin Sod-Tazobactam So] Itching, Rash and Other (See Comments)     Has patient had a PCN reaction causing immediate rash, facial/tongue/throat swelling, SOB or lightheadedness with hypotension: No Has patient had a PCN reaction causing severe rash involving mucus membranes or skin necrosis: No Has patient had a PCN reaction that required hospitalization: No Has patient had a PCN reaction occurring within the last 10 years: Yes If all of the above answers are "NO", then may proceed with Cephalosporin use.     Past Medical History, Surgical history, Social history, and Family History were reviewed and updated.  Review of Systems: Review of Systems  Constitutional: Positive for malaise/fatigue.  HENT: Positive for congestion.   Eyes: Negative.   Respiratory: Positive for cough, shortness of breath and wheezing.   Cardiovascular: Positive for palpitations.  Gastrointestinal: Positive for diarrhea and heartburn.  Genitourinary: Negative.   Musculoskeletal: Positive for back pain and joint pain.  Skin: Negative.   Neurological: Positive for headaches.  Endo/Heme/Allergies: Negative.   Psychiatric/Behavioral: Negative.      Physical Exam:  oral temperature is 98.1 F (36.7 C). Her blood pressure is 144/56 (abnormal) and her pulse is 87. Her respiration is 20 and oxygen saturation is 95%.   Wt Readings from Last 3 Encounters:  01/27/18 236 lb (107 kg)  12/10/17 239 lb (108.4 kg)  12/05/17 239 lb (108.4 kg)     Physical Exam  Constitutional: She is oriented to person, place, and time.  HENT:  Head: Normocephalic and atraumatic.  Mouth/Throat: Oropharynx is clear and moist.  Eyes: Pupils are equal, round, and reactive to light. EOM are normal.  Neck: Normal range of motion.  Cardiovascular: Normal rate, regular rhythm and normal heart sounds.  Pulmonary/Chest: Effort normal and breath sounds normal.  Abdominal: Soft. Bowel sounds are normal.  Musculoskeletal: Normal range of motion. She exhibits no edema, tenderness or deformity.    Lymphadenopathy:    She has no cervical adenopathy.  Neurological: She is alert and oriented to person, place, and time.  Skin: Skin is warm and dry. No rash noted. No erythema.  Psychiatric: She has a normal mood and affect. Her behavior is normal. Judgment and thought content normal.  Vitals reviewed.   Lab Results  Component Value Date   WBC 15.2 (H) 02/19/2018   HGB 10.9 (L) 02/19/2018   HCT 35.3 02/19/2018   MCV 95.9 02/19/2018   PLT 406 (H) 02/19/2018     Chemistry      Component Value Date/Time   NA 137 02/19/2018 1213   NA 140 09/06/2017 1418   NA 137 01/24/2017 1143   K 4.5 02/19/2018 1213   K 4.0 09/06/2017 1418   K 4.7 01/24/2017 1143   CL 94 (L) 02/19/2018 1213   CL 98 09/06/2017 1418  CO2 34 (H) 02/19/2018 1213   CO2 30 09/06/2017 1418   CO2 30 (H) 01/24/2017 1143   BUN 15 02/19/2018 1213   BUN 19 09/06/2017 1418   BUN 15.4 08/07/2017 1500   CREATININE 1.00 02/19/2018 1213   CREATININE 0.9 09/06/2017 1418   CREATININE 0.8 08/07/2017 1500      Component Value Date/Time   CALCIUM 9.4 02/19/2018 1213   CALCIUM 9.1 09/06/2017 1418   CALCIUM 9.7 01/24/2017 1143   ALKPHOS 97 (H) 02/19/2018 1213   ALKPHOS 90 (H) 09/06/2017 1418   ALKPHOS 90 01/24/2017 1143   AST 19 02/19/2018 1213   AST 12 01/24/2017 1143   ALT 32 02/19/2018 1213   ALT 17 09/06/2017 1418   ALT 10 01/24/2017 1143   BILITOT 0.7 02/19/2018 1213   BILITOT 0.39 01/24/2017 1143         Impression and Plan: Ms. Yiu is a 73 year old white female. She has severe underlying COPD. Her FEV1 is 1.14.  Hopefully, she will have a response to immunotherapy.  I know that this will be a little bit of a tenuous situation.  She just does not have a great performance status that she will be able to tolerate chemotherapy.  We do not have any genetic mutations that we can target with oral therapy.  I spent about 40 minutes with her today.  I went over the side effects of immunotherapy with her.  I  told her that we will check her blood work before we treat her to make sure that her liver tests are okay and that her thyroid is not malfunctioning.  Around the time I spent with her was face-to-face.  I will treat her with 4 cycles of pembrolizumab and then we will repeat the PET scan and hopefully will see her response.  I will see her back in 3 weeks.     Volanda Napoleon, MD 5/22/20192:15 PM

## 2018-02-20 ENCOUNTER — Encounter: Payer: Self-pay | Admitting: Hematology & Oncology

## 2018-02-20 ENCOUNTER — Telehealth: Payer: Self-pay

## 2018-02-20 ENCOUNTER — Other Ambulatory Visit (HOSPITAL_COMMUNITY): Payer: Medicare Other

## 2018-02-20 LAB — IRON AND TIBC
IRON: 37 ug/dL — AB (ref 41–142)
Saturation Ratios: 18 % — ABNORMAL LOW (ref 21–57)
TIBC: 208 ug/dL — AB (ref 236–444)
UIBC: 170 ug/dL

## 2018-02-20 LAB — FERRITIN: Ferritin: 1137 ng/mL — ABNORMAL HIGH (ref 9–269)

## 2018-02-20 NOTE — Telephone Encounter (Signed)
LVM on home phone number requesting patient to call us back with any questions or concerns. Called alternate # and [name] her sister listed on chart told me to reach her on the # I tried prior.

## 2018-02-20 NOTE — Telephone Encounter (Signed)
Patient called back to report that she feels well. No new questions other than upcoming appt questions. Reviewed those with her. She did report she had chills last evening "for a short time, but I get those often anyway". Advised her if they return to please take her temperature to be cautious and let us know if febrile. NO further concerns reported.

## 2018-02-24 ENCOUNTER — Inpatient Hospital Stay (HOSPITAL_COMMUNITY): Payer: Medicare Other

## 2018-02-24 ENCOUNTER — Other Ambulatory Visit: Payer: Self-pay

## 2018-02-24 ENCOUNTER — Inpatient Hospital Stay (HOSPITAL_COMMUNITY)
Admission: EM | Admit: 2018-02-24 | Discharge: 2018-03-31 | DRG: 871 | Disposition: E | Payer: Medicare Other | Attending: Internal Medicine | Admitting: Internal Medicine

## 2018-02-24 ENCOUNTER — Encounter (HOSPITAL_COMMUNITY): Payer: Self-pay

## 2018-02-24 ENCOUNTER — Emergency Department (HOSPITAL_COMMUNITY): Payer: Medicare Other

## 2018-02-24 DIAGNOSIS — E872 Acidosis: Secondary | ICD-10-CM | POA: Diagnosis not present

## 2018-02-24 DIAGNOSIS — C3490 Malignant neoplasm of unspecified part of unspecified bronchus or lung: Secondary | ICD-10-CM | POA: Diagnosis not present

## 2018-02-24 DIAGNOSIS — Z515 Encounter for palliative care: Secondary | ICD-10-CM | POA: Diagnosis not present

## 2018-02-24 DIAGNOSIS — D638 Anemia in other chronic diseases classified elsewhere: Secondary | ICD-10-CM | POA: Diagnosis present

## 2018-02-24 DIAGNOSIS — C771 Secondary and unspecified malignant neoplasm of intrathoracic lymph nodes: Secondary | ICD-10-CM | POA: Diagnosis not present

## 2018-02-24 DIAGNOSIS — Z803 Family history of malignant neoplasm of breast: Secondary | ICD-10-CM

## 2018-02-24 DIAGNOSIS — C7800 Secondary malignant neoplasm of unspecified lung: Secondary | ICD-10-CM | POA: Diagnosis present

## 2018-02-24 DIAGNOSIS — Z881 Allergy status to other antibiotic agents status: Secondary | ICD-10-CM

## 2018-02-24 DIAGNOSIS — G4733 Obstructive sleep apnea (adult) (pediatric): Secondary | ICD-10-CM | POA: Diagnosis not present

## 2018-02-24 DIAGNOSIS — D500 Iron deficiency anemia secondary to blood loss (chronic): Secondary | ICD-10-CM | POA: Diagnosis present

## 2018-02-24 DIAGNOSIS — J701 Chronic and other pulmonary manifestations due to radiation: Secondary | ICD-10-CM | POA: Diagnosis present

## 2018-02-24 DIAGNOSIS — R079 Chest pain, unspecified: Secondary | ICD-10-CM | POA: Diagnosis not present

## 2018-02-24 DIAGNOSIS — C3491 Malignant neoplasm of unspecified part of right bronchus or lung: Secondary | ICD-10-CM | POA: Diagnosis not present

## 2018-02-24 DIAGNOSIS — E6609 Other obesity due to excess calories: Secondary | ICD-10-CM

## 2018-02-24 DIAGNOSIS — R6520 Severe sepsis without septic shock: Secondary | ICD-10-CM | POA: Diagnosis not present

## 2018-02-24 DIAGNOSIS — I5032 Chronic diastolic (congestive) heart failure: Secondary | ICD-10-CM | POA: Diagnosis not present

## 2018-02-24 DIAGNOSIS — E873 Alkalosis: Secondary | ICD-10-CM | POA: Diagnosis present

## 2018-02-24 DIAGNOSIS — J9600 Acute respiratory failure, unspecified whether with hypoxia or hypercapnia: Secondary | ICD-10-CM

## 2018-02-24 DIAGNOSIS — G9349 Other encephalopathy: Secondary | ICD-10-CM | POA: Diagnosis not present

## 2018-02-24 DIAGNOSIS — R0989 Other specified symptoms and signs involving the circulatory and respiratory systems: Secondary | ICD-10-CM

## 2018-02-24 DIAGNOSIS — R1310 Dysphagia, unspecified: Secondary | ICD-10-CM | POA: Diagnosis present

## 2018-02-24 DIAGNOSIS — I7101 Dissection of thoracic aorta: Secondary | ICD-10-CM | POA: Diagnosis present

## 2018-02-24 DIAGNOSIS — I482 Chronic atrial fibrillation: Secondary | ICD-10-CM | POA: Diagnosis present

## 2018-02-24 DIAGNOSIS — K219 Gastro-esophageal reflux disease without esophagitis: Secondary | ICD-10-CM | POA: Diagnosis present

## 2018-02-24 DIAGNOSIS — J9622 Acute and chronic respiratory failure with hypercapnia: Secondary | ICD-10-CM | POA: Diagnosis present

## 2018-02-24 DIAGNOSIS — J9602 Acute respiratory failure with hypercapnia: Secondary | ICD-10-CM | POA: Diagnosis not present

## 2018-02-24 DIAGNOSIS — K224 Dyskinesia of esophagus: Secondary | ICD-10-CM | POA: Diagnosis present

## 2018-02-24 DIAGNOSIS — J181 Lobar pneumonia, unspecified organism: Secondary | ICD-10-CM | POA: Diagnosis present

## 2018-02-24 DIAGNOSIS — F410 Panic disorder [episodic paroxysmal anxiety] without agoraphobia: Secondary | ICD-10-CM | POA: Diagnosis not present

## 2018-02-24 DIAGNOSIS — Z66 Do not resuscitate: Secondary | ICD-10-CM | POA: Diagnosis present

## 2018-02-24 DIAGNOSIS — Z7189 Other specified counseling: Secondary | ICD-10-CM

## 2018-02-24 DIAGNOSIS — E662 Morbid (severe) obesity with alveolar hypoventilation: Secondary | ICD-10-CM | POA: Diagnosis present

## 2018-02-24 DIAGNOSIS — Z79899 Other long term (current) drug therapy: Secondary | ICD-10-CM

## 2018-02-24 DIAGNOSIS — F419 Anxiety disorder, unspecified: Secondary | ICD-10-CM | POA: Diagnosis present

## 2018-02-24 DIAGNOSIS — J9601 Acute respiratory failure with hypoxia: Secondary | ICD-10-CM | POA: Diagnosis not present

## 2018-02-24 DIAGNOSIS — Z7951 Long term (current) use of inhaled steroids: Secondary | ICD-10-CM

## 2018-02-24 DIAGNOSIS — C3411 Malignant neoplasm of upper lobe, right bronchus or lung: Secondary | ICD-10-CM | POA: Diagnosis present

## 2018-02-24 DIAGNOSIS — N179 Acute kidney failure, unspecified: Secondary | ICD-10-CM | POA: Diagnosis present

## 2018-02-24 DIAGNOSIS — J910 Malignant pleural effusion: Secondary | ICD-10-CM | POA: Diagnosis present

## 2018-02-24 DIAGNOSIS — J441 Chronic obstructive pulmonary disease with (acute) exacerbation: Secondary | ICD-10-CM | POA: Diagnosis present

## 2018-02-24 DIAGNOSIS — R0600 Dyspnea, unspecified: Secondary | ICD-10-CM

## 2018-02-24 DIAGNOSIS — J430 Unilateral pulmonary emphysema [MacLeod's syndrome]: Secondary | ICD-10-CM | POA: Diagnosis present

## 2018-02-24 DIAGNOSIS — J9621 Acute and chronic respiratory failure with hypoxia: Secondary | ICD-10-CM | POA: Diagnosis present

## 2018-02-24 DIAGNOSIS — C7951 Secondary malignant neoplasm of bone: Secondary | ICD-10-CM | POA: Diagnosis present

## 2018-02-24 DIAGNOSIS — C7801 Secondary malignant neoplasm of right lung: Secondary | ICD-10-CM | POA: Diagnosis not present

## 2018-02-24 DIAGNOSIS — I110 Hypertensive heart disease with heart failure: Secondary | ICD-10-CM | POA: Diagnosis present

## 2018-02-24 DIAGNOSIS — Z9981 Dependence on supplemental oxygen: Secondary | ICD-10-CM

## 2018-02-24 DIAGNOSIS — Z9225 Personal history of immunosupression therapy: Secondary | ICD-10-CM | POA: Diagnosis not present

## 2018-02-24 DIAGNOSIS — J440 Chronic obstructive pulmonary disease with acute lower respiratory infection: Secondary | ICD-10-CM | POA: Diagnosis present

## 2018-02-24 DIAGNOSIS — E669 Obesity, unspecified: Secondary | ICD-10-CM | POA: Diagnosis present

## 2018-02-24 DIAGNOSIS — J90 Pleural effusion, not elsewhere classified: Secondary | ICD-10-CM | POA: Diagnosis not present

## 2018-02-24 DIAGNOSIS — J42 Unspecified chronic bronchitis: Secondary | ICD-10-CM

## 2018-02-24 DIAGNOSIS — Z6839 Body mass index (BMI) 39.0-39.9, adult: Secondary | ICD-10-CM

## 2018-02-24 DIAGNOSIS — C3412 Malignant neoplasm of upper lobe, left bronchus or lung: Secondary | ICD-10-CM | POA: Diagnosis not present

## 2018-02-24 DIAGNOSIS — I5033 Acute on chronic diastolic (congestive) heart failure: Secondary | ICD-10-CM | POA: Diagnosis present

## 2018-02-24 DIAGNOSIS — I509 Heart failure, unspecified: Secondary | ICD-10-CM | POA: Diagnosis not present

## 2018-02-24 DIAGNOSIS — E860 Dehydration: Secondary | ICD-10-CM | POA: Diagnosis present

## 2018-02-24 DIAGNOSIS — Z9119 Patient's noncompliance with other medical treatment and regimen: Secondary | ICD-10-CM

## 2018-02-24 DIAGNOSIS — J690 Pneumonitis due to inhalation of food and vomit: Secondary | ICD-10-CM

## 2018-02-24 DIAGNOSIS — C787 Secondary malignant neoplasm of liver and intrahepatic bile duct: Secondary | ICD-10-CM | POA: Diagnosis not present

## 2018-02-24 DIAGNOSIS — J449 Chronic obstructive pulmonary disease, unspecified: Secondary | ICD-10-CM | POA: Diagnosis not present

## 2018-02-24 DIAGNOSIS — M7989 Other specified soft tissue disorders: Secondary | ICD-10-CM | POA: Diagnosis not present

## 2018-02-24 DIAGNOSIS — I4891 Unspecified atrial fibrillation: Secondary | ICD-10-CM | POA: Diagnosis not present

## 2018-02-24 DIAGNOSIS — D509 Iron deficiency anemia, unspecified: Secondary | ICD-10-CM | POA: Diagnosis present

## 2018-02-24 DIAGNOSIS — Z888 Allergy status to other drugs, medicaments and biological substances status: Secondary | ICD-10-CM

## 2018-02-24 DIAGNOSIS — C3431 Malignant neoplasm of lower lobe, right bronchus or lung: Secondary | ICD-10-CM | POA: Diagnosis present

## 2018-02-24 DIAGNOSIS — C3410 Malignant neoplasm of upper lobe, unspecified bronchus or lung: Secondary | ICD-10-CM

## 2018-02-24 DIAGNOSIS — R5381 Other malaise: Secondary | ICD-10-CM | POA: Diagnosis present

## 2018-02-24 DIAGNOSIS — J189 Pneumonia, unspecified organism: Secondary | ICD-10-CM | POA: Diagnosis present

## 2018-02-24 DIAGNOSIS — I10 Essential (primary) hypertension: Secondary | ICD-10-CM

## 2018-02-24 DIAGNOSIS — I712 Thoracic aortic aneurysm, without rupture: Secondary | ICD-10-CM | POA: Diagnosis not present

## 2018-02-24 DIAGNOSIS — Z9849 Cataract extraction status, unspecified eye: Secondary | ICD-10-CM

## 2018-02-24 DIAGNOSIS — Z825 Family history of asthma and other chronic lower respiratory diseases: Secondary | ICD-10-CM

## 2018-02-24 DIAGNOSIS — A419 Sepsis, unspecified organism: Secondary | ICD-10-CM | POA: Diagnosis present

## 2018-02-24 DIAGNOSIS — Z87891 Personal history of nicotine dependence: Secondary | ICD-10-CM

## 2018-02-24 DIAGNOSIS — I5030 Unspecified diastolic (congestive) heart failure: Secondary | ICD-10-CM | POA: Diagnosis not present

## 2018-02-24 DIAGNOSIS — Z993 Dependence on wheelchair: Secondary | ICD-10-CM

## 2018-02-24 DIAGNOSIS — Z792 Long term (current) use of antibiotics: Secondary | ICD-10-CM | POA: Diagnosis not present

## 2018-02-24 HISTORY — DX: Panic disorder (episodic paroxysmal anxiety): F41.0

## 2018-02-24 LAB — CBC WITH DIFFERENTIAL/PLATELET
Basophils Absolute: 0 10*3/uL (ref 0.0–0.1)
Basophils Relative: 0 %
EOS ABS: 0 10*3/uL (ref 0.0–0.7)
EOS PCT: 0 %
HCT: 35.4 % — ABNORMAL LOW (ref 36.0–46.0)
Hemoglobin: 10.9 g/dL — ABNORMAL LOW (ref 12.0–15.0)
LYMPHS ABS: 0.8 10*3/uL (ref 0.7–4.0)
LYMPHS PCT: 5 %
MCH: 29.1 pg (ref 26.0–34.0)
MCHC: 30.8 g/dL (ref 30.0–36.0)
MCV: 94.4 fL (ref 78.0–100.0)
MONO ABS: 0.7 10*3/uL (ref 0.1–1.0)
MONOS PCT: 4 %
Neutro Abs: 15.3 10*3/uL — ABNORMAL HIGH (ref 1.7–7.7)
Neutrophils Relative %: 91 %
PLATELETS: 490 10*3/uL — AB (ref 150–400)
RBC: 3.75 MIL/uL — AB (ref 3.87–5.11)
RDW: 15.3 % (ref 11.5–15.5)
WBC: 16.9 10*3/uL — ABNORMAL HIGH (ref 4.0–10.5)

## 2018-02-24 LAB — COMPREHENSIVE METABOLIC PANEL
ALBUMIN: 3 g/dL — AB (ref 3.5–5.0)
ALT: 24 U/L (ref 14–54)
AST: 18 U/L (ref 15–41)
Alkaline Phosphatase: 95 U/L (ref 38–126)
Anion gap: 13 (ref 5–15)
BUN: 17 mg/dL (ref 6–20)
CHLORIDE: 89 mmol/L — AB (ref 101–111)
CO2: 34 mmol/L — ABNORMAL HIGH (ref 22–32)
CREATININE: 0.91 mg/dL (ref 0.44–1.00)
Calcium: 9.2 mg/dL (ref 8.9–10.3)
GFR calc Af Amer: >60 mL/min (ref >60)
GFR calc non Af Amer: >60 mL/min (ref >60)
GLUCOSE: 129 mg/dL — AB (ref 65–99)
POTASSIUM: 4.1 mmol/L (ref 3.5–5.1)
Sodium: 136 mmol/L (ref 135–145)
Total Bilirubin: 0.6 mg/dL (ref 0.3–1.2)
Total Protein: 7.8 g/dL (ref 6.5–8.1)

## 2018-02-24 LAB — I-STAT CHEM 8, ED
BUN: 14 mg/dL (ref 6–20)
CALCIUM ION: 1.13 mmol/L — AB (ref 1.15–1.40)
Chloride: 87 mmol/L — ABNORMAL LOW (ref 101–111)
Creatinine, Ser: 1 mg/dL (ref 0.44–1.00)
Glucose, Bld: 127 mg/dL — ABNORMAL HIGH (ref 65–99)
HCT: 36 % (ref 36.0–46.0)
Hemoglobin: 12.2 g/dL (ref 12.0–15.0)
Potassium: 4.1 mmol/L (ref 3.5–5.1)
Sodium: 133 mmol/L — ABNORMAL LOW (ref 135–145)
TCO2: 36 mmol/L — AB (ref 22–32)

## 2018-02-24 LAB — PROCALCITONIN: Procalcitonin: 0.53 ng/mL

## 2018-02-24 LAB — BLOOD GAS, VENOUS
ACID-BASE EXCESS: 9.7 mmol/L — AB (ref 0.0–2.0)
BICARBONATE: 37.2 mmol/L — AB (ref 20.0–28.0)
Drawn by: 295031
O2 CONTENT: 8 L/min
O2 Saturation: 83.6 %
PATIENT TEMPERATURE: 98.6
pCO2, Ven: 70.4 mmHg (ref 44.0–60.0)
pH, Ven: 7.343 (ref 7.250–7.430)
pO2, Ven: 54.6 mmHg — ABNORMAL HIGH (ref 32.0–45.0)

## 2018-02-24 LAB — I-STAT TROPONIN, ED: Troponin i, poc: 0.01 ng/mL (ref 0.00–0.08)

## 2018-02-24 LAB — I-STAT CG4 LACTIC ACID, ED: LACTIC ACID, VENOUS: 1.37 mmol/L (ref 0.5–1.9)

## 2018-02-24 MED ORDER — ONDANSETRON HCL 4 MG/2ML IJ SOLN
4.0000 mg | Freq: Four times a day (QID) | INTRAMUSCULAR | Status: DC | PRN
  Administered 2018-03-12 – 2018-03-13 (×3): 4 mg via INTRAVENOUS
  Filled 2018-02-24 (×3): qty 2

## 2018-02-24 MED ORDER — GUAIFENESIN ER 600 MG PO TB12
600.0000 mg | ORAL_TABLET | Freq: Two times a day (BID) | ORAL | Status: DC
  Administered 2018-02-24 – 2018-02-26 (×4): 600 mg via ORAL
  Filled 2018-02-24 (×4): qty 1

## 2018-02-24 MED ORDER — METHYLPREDNISOLONE SODIUM SUCC 40 MG IJ SOLR
40.0000 mg | Freq: Every day | INTRAMUSCULAR | Status: DC
  Administered 2018-02-25 – 2018-02-27 (×3): 40 mg via INTRAVENOUS
  Filled 2018-02-24 (×3): qty 1

## 2018-02-24 MED ORDER — VANCOMYCIN HCL 10 G IV SOLR
2000.0000 mg | Freq: Once | INTRAVENOUS | Status: AC
  Administered 2018-02-24: 2000 mg via INTRAVENOUS
  Filled 2018-02-24: qty 2000

## 2018-02-24 MED ORDER — LOSARTAN POTASSIUM 50 MG PO TABS: 100.0000 mg | ORAL_TABLET | Freq: Every day | ORAL | Status: DC

## 2018-02-24 MED ORDER — HYDROCHLOROTHIAZIDE 25 MG PO TABS: 25.0000 mg | ORAL_TABLET | Freq: Every day | ORAL | Status: DC

## 2018-02-24 MED ORDER — METOPROLOL SUCCINATE ER 25 MG PO TB24
150.0000 mg | ORAL_TABLET | Freq: Every day | ORAL | Status: DC
  Administered 2018-02-24 – 2018-02-27 (×4): 150 mg via ORAL
  Filled 2018-02-24 (×4): qty 1

## 2018-02-24 MED ORDER — LOSARTAN POTASSIUM-HCTZ 100-25 MG PO TABS: 1.0000 | ORAL_TABLET | Freq: Every day | ORAL | Status: DC

## 2018-02-24 MED ORDER — ALBUTEROL SULFATE (2.5 MG/3ML) 0.083% IN NEBU
2.5000 mg | INHALATION_SOLUTION | Freq: Three times a day (TID) | RESPIRATORY_TRACT | Status: DC
  Administered 2018-02-25: 2.5 mg via RESPIRATORY_TRACT
  Filled 2018-02-24: qty 3

## 2018-02-24 MED ORDER — ENOXAPARIN SODIUM 40 MG/0.4ML ~~LOC~~ SOLN
40.0000 mg | SUBCUTANEOUS | Status: DC
  Administered 2018-02-24: 40 mg via SUBCUTANEOUS
  Filled 2018-02-24: qty 0.4

## 2018-02-24 MED ORDER — VANCOMYCIN HCL 10 G IV SOLR: 1250.0000 mg | INTRAVENOUS | Status: DC

## 2018-02-24 MED ORDER — SODIUM CHLORIDE 0.9 % IV SOLN
INTRAVENOUS | Status: AC
  Administered 2018-02-24: 23:00:00 via INTRAVENOUS

## 2018-02-24 MED ORDER — SODIUM CHLORIDE 0.9 % IV SOLN
2.0000 g | Freq: Once | INTRAVENOUS | Status: AC
  Administered 2018-02-24: 2 g via INTRAVENOUS
  Filled 2018-02-24: qty 2

## 2018-02-24 MED ORDER — ACETAMINOPHEN 650 MG RE SUPP: 650.0000 mg | Freq: Four times a day (QID) | RECTAL | Status: DC | PRN

## 2018-02-24 MED ORDER — ONDANSETRON HCL 4 MG PO TABS
4.0000 mg | ORAL_TABLET | Freq: Four times a day (QID) | ORAL | Status: DC | PRN
  Administered 2018-03-04: 4 mg via ORAL
  Filled 2018-02-24: qty 1

## 2018-02-24 MED ORDER — AMLODIPINE BESYLATE 10 MG PO TABS
10.0000 mg | ORAL_TABLET | Freq: Every day | ORAL | Status: DC
  Administered 2018-02-24 – 2018-03-01 (×6): 10 mg via ORAL
  Filled 2018-02-24 (×3): qty 2
  Filled 2018-02-24 (×2): qty 1
  Filled 2018-02-24: qty 2

## 2018-02-24 MED ORDER — SODIUM CHLORIDE 0.9 % IV SOLN
2.0000 g | Freq: Three times a day (TID) | INTRAVENOUS | Status: DC
  Administered 2018-02-24 – 2018-02-25 (×2): 2 g via INTRAVENOUS
  Filled 2018-02-24 (×3): qty 2

## 2018-02-24 MED ORDER — ACETAMINOPHEN 325 MG PO TABS
650.0000 mg | ORAL_TABLET | Freq: Four times a day (QID) | ORAL | Status: DC | PRN
  Administered 2018-03-04 – 2018-03-12 (×4): 650 mg via ORAL
  Filled 2018-02-24 (×4): qty 2

## 2018-02-24 MED ORDER — HYDROCODONE-ACETAMINOPHEN 5-325 MG PO TABS
1.0000 | ORAL_TABLET | Freq: Four times a day (QID) | ORAL | Status: DC | PRN
  Administered 2018-02-24 – 2018-03-15 (×9): 1 via ORAL
  Filled 2018-02-24 (×11): qty 1

## 2018-02-24 MED ORDER — ALBUTEROL SULFATE (2.5 MG/3ML) 0.083% IN NEBU: 2.5000 mg | INHALATION_SOLUTION | RESPIRATORY_TRACT | Status: DC | PRN

## 2018-02-24 MED ORDER — UMECLIDINIUM-VILANTEROL 62.5-25 MCG/INH IN AEPB
1.0000 | INHALATION_SPRAY | Freq: Every day | RESPIRATORY_TRACT | Status: DC
  Administered 2018-02-25 – 2018-02-26 (×2): 1 via RESPIRATORY_TRACT
  Filled 2018-02-24: qty 14

## 2018-02-24 MED ORDER — ACETAMINOPHEN 325 MG PO TABS
650.0000 mg | ORAL_TABLET | Freq: Once | ORAL | Status: AC
  Administered 2018-02-24: 650 mg via ORAL
  Filled 2018-02-24: qty 2

## 2018-02-24 MED ORDER — PANTOPRAZOLE SODIUM 40 MG PO TBEC
40.0000 mg | DELAYED_RELEASE_TABLET | Freq: Two times a day (BID) | ORAL | Status: DC
  Administered 2018-02-24 – 2018-03-15 (×33): 40 mg via ORAL
  Filled 2018-02-24 (×35): qty 1

## 2018-02-24 MED ORDER — FLUTICASONE PROPIONATE 50 MCG/ACT NA SUSP
2.0000 | Freq: Every day | NASAL | Status: DC | PRN
  Filled 2018-02-24: qty 16

## 2018-02-24 MED ORDER — DIPHENHYDRAMINE HCL 25 MG PO CAPS
25.0000 mg | ORAL_CAPSULE | ORAL | Status: DC | PRN
  Administered 2018-02-24 – 2018-03-03 (×5): 25 mg via ORAL
  Filled 2018-02-24 (×7): qty 1

## 2018-02-24 MED ORDER — CLONAZEPAM 0.5 MG PO TABS
0.5000 mg | ORAL_TABLET | Freq: Two times a day (BID) | ORAL | Status: DC | PRN
  Administered 2018-02-26 – 2018-03-13 (×17): 0.5 mg via ORAL
  Filled 2018-02-24 (×18): qty 1

## 2018-02-24 MED ORDER — IOHEXOL 300 MG/ML  SOLN
75.0000 mL | Freq: Once | INTRAMUSCULAR | Status: AC | PRN
  Administered 2018-02-24: 75 mL via INTRAVENOUS

## 2018-02-24 MED ORDER — ALBUTEROL SULFATE (2.5 MG/3ML) 0.083% IN NEBU
2.5000 mg | INHALATION_SOLUTION | Freq: Four times a day (QID) | RESPIRATORY_TRACT | Status: DC
  Administered 2018-02-24: 2.5 mg via RESPIRATORY_TRACT
  Filled 2018-02-24: qty 3

## 2018-02-24 NOTE — Progress Notes (Signed)
Pharmacy Note   A consult was received from an ED physician for vancomycin per pharmacy dosing.    The patient's profile has been reviewed for ht/wt/allergies/indication/available labs.    A one time order has been placed for vancomycin 2000 mg IV x1 .  Further antibiotics/pharmacy consults should be ordered by admitting physician if indicated.                       Thank you,  Royetta Asal, PharmD, BCPS Pager [phone] 02/23/2018 3:13 PM

## 2018-02-24 NOTE — ED Notes (Addendum)
Pt transported to CT ?

## 2018-02-24 NOTE — ED Notes (Signed)
Pt took home med. Klonopin, without staff knowledge. Patient advised of policy and Klonopin and Prednisone taken home with patients sister.   Alisha Fisher [phone]- Sister

## 2018-02-24 NOTE — Progress Notes (Signed)
Pharmacy Antibiotic Note  Alisha Fisher is a 72 y.o. female admitted on 02/11/2018 with pneumonia.  PMH significant for lung cancer & COPD.  CXR + PNA.  Pharmacy has been consulted for Vancomycin dosing. 01/30/2018:  Vancomycin 2gm IV loading dose given in ED.   Febrile- Tmax 102.18F  WBC elevated at 16.9  Renal function at baseline - Scr 0.91  Plan: Aztreonam per MD Vancomycin 1250mg  IV q24h (AUC target 400-500) MRSA PCR pending Monitor renal function and cx data    Height: 5\' 7"  (170.2 cm) Weight: 230 lb (104.3 kg) IBW/kg (Calculated) : 61.6  Temp (24hrs), Avg:102.5 F (39.2 C), Min:102.5 F (39.2 C), Max:102.5 F (39.2 C)  Recent Labs  Lab 02/19/18 1213 02/05/2018 1454  WBC 15.2* 16.9*  CREATININE 1.00 0.91  1.00  LATICACIDVEN  --  1.37    Estimated Creatinine Clearance: 71.5 mL/min (by C-G formula based on SCr of 0.91 mg/dL).    Allergies  Allergen Reactions  . Ciprofloxacin Rash and Other (See Comments)    Rash possibly caused by Cipro?  . Flagyl [Metronidazole] Rash and Other (See Comments)    Rash possibly caused by Flagyl?  Marland Kitchen Zosyn [Piperacillin Sod-Tazobactam So] Itching, Rash and Other (See Comments)    Has patient had a PCN reaction causing immediate rash, facial/tongue/throat swelling, SOB or lightheadedness with hypotension: No Has patient had a PCN reaction causing severe rash involving mucus membranes or skin necrosis: No Has patient had a PCN reaction that required hospitalization: No Has patient had a PCN reaction occurring within the last 10 years: Yes If all of the above answers are "NO", then may proceed with Cephalosporin use.     Antimicrobials this admission: 5/27 Vanc >>  5/27 Aztreonam >>   Dose adjustments this admission:  Microbiology results: 5/27 BCx:  UCx:  Sputum:  MRSA PCR:  S Pneumo: Legionella:  Thank you for allowing pharmacy to be a part of this patient's care.  Biagio Borg 02/28/2018 5:01 PM

## 2018-02-24 NOTE — ED Triage Notes (Signed)
Pt. Arrived via EMS from home. Complaining of shortness breath x 1 day, and wheezing in lungs. In field saturations were 80% on 3 L [place] (dependent O2). Placed on Nonrebreather in field, 10 of albuterol, 4 mg of Zofran and 1 of Atrovent given by EMS. Oxygen saturations now 96% on non rebreather. Hx of COPD, Lung Cancer and panic attacks.

## 2018-02-24 NOTE — ED Notes (Signed)
Pt back from CT

## 2018-02-24 NOTE — Progress Notes (Signed)
Cpap held at this time as pt states she does not wear one at home.  Pt also choked and spit out food she recently eaten.  Pt stated she has had some difficulty swallowing since having radiation.  RN aware.  Pt also is frequently coughing with excessive mucus production.  RT will monitor and assess as needed.

## 2018-02-24 NOTE — H&P (Signed)
History and Physical  Patient Name: Alisha Fisher     MPN:[id number]    DOB: [birth year]    DOA: 01/29/2018 PCP: [name], MD  Patient coming from: Home  Chief Complaint: Dyspnea, malaise, fever      HPI: Deiona Hooper is a 72 y.o. female with a past medical history significant for Squamous cell carcinoma Lung locally advanced, COPD home O2 3L FEV1 47%, wheelchair bound, morbid obesity, OSA not on CPAP, dCHF, chronic IDA, and HTN who presents with 4-5 days malaise, progressive cough and now dyspnea.  The patient was in her usual state of health until the middle of last week when she started to have increased chest congestion, cough, vague malaise, and fatigue. Last Thursday she started Keytruda, and over the weekend, she became more and more out of breath, until today she couldn't catch her breath at all, and had a low grade fever. EMS arrived and found her hypoxic to 80% on her home O2.  ED course: - Temp 102.55F, heart rate 139, respirations 20 and pulse ox 98% on nonrebreather, blood pressure 171/71 -Na 136, K 4.1, Cr 0.9 (baseline 1.0), WBC 16.9 K, Hgb 10.8 - Lactic acid 1.3 - Troponin negative -VBG showed normal pH, PCO2 70, oxygen level within normal limits - She was given Solu-Medrol and a continuous albuterol neb, after which she was weaned to her home oxygen and felt much better -CXR showed large right pleural effusion and pneumonia -She was given vancomycin and Azactam for community acquired pneumonia with risk factors for Pseudomonas and MRSA and admitted to the hospital service     ROS: Review of Systems  Constitutional: Positive for fever and malaise/fatigue.  Respiratory: Positive for cough, sputum production, shortness of breath and wheezing.   Cardiovascular: Negative for chest pain, palpitations, orthopnea and leg swelling.  Genitourinary: Negative for dysuria and urgency.  Skin: Positive for itching (Chronic). Negative for rash.  Neurological: Negative for  dizziness, tremors, sensory change, focal weakness, seizures and loss of consciousness.  Psychiatric/Behavioral: The patient is nervous/anxious.   All other systems reviewed and are negative.         Past Medical History:  Diagnosis Date  . Cancer ([place])    lung cancer  . COPD, severe ([place])   . Goals of care, counseling/discussion 02/12/2018  . HTN (hypertension)   . Iron deficiency anemia 11/07/2017  . Panic attack   . Pneumonia   . Pulmonary emphysema ([place])   . Sleep apnea    no cpap needs more recent sleep study  . SOB (shortness of breath)     Past Surgical History:  Procedure Laterality Date  . CATARACT EXTRACTION    . ENDOBRONCHIAL ULTRASOUND Bilateral 12/17/2016   Procedure: ENDOBRONCHIAL ULTRASOUND;  Surgeon: Javier Glazier, MD;  Location: [facility];  Service: Cardiopulmonary;  Laterality: Bilateral;  . IR GENERIC HISTORICAL  12/21/2016   IR SINUS/FIST TUBE CHK-NON GI 12/21/2016 [facility]  . IR GENERIC HISTORICAL  12/21/2016   IR US GUIDE BX ASP/DRAIN 12/21/2016 Corrie Mckusick, DO [facility]  . IR RADIOLOGIST EVAL & MGMT  01/17/2017  . VIDEO BRONCHOSCOPY WITH ENDOBRONCHIAL ULTRASOUND N/A 12/10/2017   Procedure: VIDEO BRONCHOSCOPY WITH ENDOBRONCHIAL ULTRASOUND;  Surgeon: Collene Gobble, MD;  Location: [facility] OR;  Service: Thoracic;  Laterality: N/A;  . WISDOM TOOTH EXTRACTION      Social History: Patient lives at home, her son lives with her.  The patient is wheelchair-bound.  Former smoker quit 1 year ago.  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  No alcohol.  Allergies  Allergen Reactions  . Ciprofloxacin Rash and Other (See Comments)    Rash possibly caused by Cipro?  . Flagyl [Metronidazole] Rash and Other (See Comments)    Rash possibly caused by Flagyl?  Marland Kitchen Zosyn [Piperacillin Sod-Tazobactam So] Itching, Rash and Other (See Comments)    Has patient had a PCN reaction causing immediate rash,  facial/tongue/throat swelling, SOB or lightheadedness with hypotension: No Has patient had a PCN reaction causing severe rash involving mucus membranes or skin necrosis: No Has patient had a PCN reaction that required hospitalization: No Has patient had a PCN reaction occurring within the last 10 years: Yes If all of the above answers are "NO", then may proceed with Cephalosporin use.     Family history: family history includes Allergies in her unknown relative; Breast cancer in her mother; COPD in her cousin; Diabetes in her father and son.  Prior to Admission medications   Medication Sig Start Date End Date Taking? Authorizing Provider  ADVAIR DISKUS 250-50 MCG/DOSE AEPB Inhale 1 puff into the lungs 2 (two) times daily. 12/10/17   Collene Gobble, MD  albuterol (PROVENTIL HFA;VENTOLIN HFA) 108 (90 Base) MCG/ACT inhaler Inhale 2 puffs into the lungs every 6 (six) hours as needed for wheezing or shortness of breath. 06/20/17   Javier Glazier, MD  amLODipine (NORVASC) 10 MG tablet Take 1 tablet (10 mg total) daily by mouth. Patient not taking: Reported on 02/19/2018 08/06/17   Belva Crome, MD  Celedonio Miyamoto 62.5-25 MCG/INH AEPB  02/14/18   [provider]  clonazePAM (KLONOPIN) 0.5 MG tablet Take 1 tablet (0.5 mg total) by mouth 2 (two) times daily as needed for anxiety. 12/05/17   Collene Gobble, MD  diphenhydrAMINE (BENADRYL) 25 mg capsule Take 25 mg by mouth every 4 (four) hours as needed for itching or allergies.    [provider]  fluticasone (FLONASE) 50 MCG/ACT nasal spray Place 2 sprays into both nostrils daily as needed for allergies. 12/10/17   Collene Gobble, MD  HYDROcodone-homatropine (HYCODAN) 5-1.5 MG/5ML syrup Take 5 mLs by mouth every 6 (six) hours as needed for cough. 02/19/18   Volanda Napoleon, MD  loratadine (CLARITIN) 10 MG tablet Take 1 tablet (10 mg total) by mouth daily as needed for allergies. 12/10/17   Collene Gobble, MD    losartan-hydrochlorothiazide (HYZAAR) 100-25 MG tablet Take 1 tablet daily by mouth. 08/06/17   Belva Crome, MD  metoprolol succinate (TOPROL-XL) 100 MG 24 hr tablet Take 2 tablets (200 mg total) by mouth daily. 12/10/17   Collene Gobble, MD  ondansetron (ZOFRAN) 8 MG tablet Take 1 tablet (8 mg total) by mouth 2 (two) times daily as needed (Nausea or vomiting). 02/19/18   Volanda Napoleon, MD  pantoprazole (PROTONIX) 40 MG tablet Take 1 tablet (40 mg total) by mouth 2 (two) times daily. 02/19/18   Volanda Napoleon, MD  prochlorperazine (COMPAZINE) 10 MG tablet Take 1 tablet (10 mg total) by mouth every 6 (six) hours as needed (Nausea or vomiting). 02/19/18   Volanda Napoleon, MD  senna-docusate (SENOKOT-S) 8.6-50 MG tablet Take 1 tablet by mouth daily. 12/10/17   Collene Gobble, MD       Physical Exam: BP (!) 171/71 (BP Location: Right Arm)   Pulse (!) 139   Temp (!) 102.5 F (39.2 C) (Rectal)   Resp  20   Ht 5\' 7"  (1.702 m)   Wt 104.3 kg (230 lb)   SpO2 99%   BMI 36.02 kg/m  General appearance: Well-developed, obese adult female, alert and in mild respiratory distress from dyspnea.   Eyes: Anicteric, conjunctiva pink, lids and lashes normal. PERRL.    ENT: No nasal deformity, discharge, epistaxis.  Hearing normal. OP moist without lesions.  Dentition and lips normal.   Skin: Warm and dry.  No jaundice.  No suspicious rashes or lesions. Cardiac: Tachycardic, regular, nl S1-S2, no murmurs appreciated.  Capillary refill is brisk.  JVP not visible.  No LE edema.  Radial and DP pulses 2+ and symmetric. Respiratory: Tachypneic,using accessorymuscles. Speaks in short sentences. On nasal cannula, respiratory rate normal. Abdomen: Abdomen soft.  No TTP. No ascites, distension, hepatosplenomegaly.   MSK: No deformities or effusions in largejoints of upper or lower extremities bilaterally.  No cyanosis or clubbing. Neuro: Cranial nerves normal.  Sensation intact to light touch. Speech is fluent.   Muscle strength normal.    Psych: Sensorium intact and responding to questions, attention normal.  Behavior appropriate.  Affect normal.  Judgment and insight appear normal.     Labs on Admission:  I have personally reviewed following labs and imaging studies: CBC: Recent Labs  Lab 02/19/18 1213 02/13/2018 1454  WBC 15.2* 16.9*  NEUTROABS 14.1* 15.3*  HGB 10.9* 10.9*  12.2  HCT 35.3 35.4*  36.0  MCV 95.9 94.4  PLT 406* 841*   Basic Metabolic Panel: Recent Labs  Lab 02/19/18 1213 02/23/2018 1454  NA 137 136  133*  K 4.5 4.1  4.1  CL 94* 89*  87*  CO2 34* 34*  GLUCOSE 113 129*  127*  BUN 15 17  14   CREATININE 1.00 0.91  1.00  CALCIUM 9.4 9.2   GFR: Estimated Creatinine Clearance: 71.5 mL/min (by C-G formula based on SCr of 0.91 mg/dL).  Liver Function Tests: Recent Labs  Lab 02/19/18 1213 02/16/2018 1454  AST 19 18  ALT 32 24  ALKPHOS 97* 95  BILITOT 0.7 0.6  PROT 7.6 7.8  ALBUMIN 2.9* 3.0*   No results for input(s): LIPASE, AMYLASE in the last 168 hours. No results for input(s): AMMONIA in the last 168 hours. Coagulation Profile: No results for input(s): INR, PROTIME in the last 168 hours. Cardiac Enzymes: No results for input(s): CKTOTAL, CKMB, CKMBINDEX, TROPONINI in the last 168 hours. BNP (last 3 results) No results for input(s): PROBNP in the last 8760 hours. HbA1C: No results for input(s): HGBA1C in the last 72 hours. CBG: No results for input(s): GLUCAP in the last 168 hours. Lipid Profile: No results for input(s): CHOL, HDL, LDLCALC, TRIG, CHOLHDL, LDLDIRECT in the last 72 hours. Thyroid Function Tests: No results for input(s): TSH, T4TOTAL, FREET4, T3FREE, THYROIDAB in the last 72 hours. Anemia Panel: No results for input(s): VITAMINB12, FOLATE, FERRITIN, TIBC, IRON, RETICCTPCT in the last 72 hours. Sepsis Labs: Lactic acid 1.37          Radiological Exams on Admission: Personally reviewed CXR shows right sided effusion, kyphosis,  obesity, suspect rightsided airspace disease: Dg Chest Port 1 View  Result Date: 02/08/2018 CLINICAL DATA:  Shortness of breath. EXAM: PORTABLE CHEST 1 VIEW COMPARISON:  Chest CT 02/07/2018 FINDINGS: Cardiomediastinal silhouette is normal. Calcific atherosclerotic disease and tortuosity of the aorta. There is no evidence of pneumothorax. Similar appearance of right hemithorax with airspace opacity in the right lower lobe, likely representing tumor and diffuse peribronchovascular infiltrate versus lymphangitic  spread of tumor. Interval development of right pleural effusion. Chronic interstitial lung changes versus metastatic disease in the left upper lobe of the lung also stable. Osseous structures are without acute abnormality. Soft tissues are grossly normal. IMPRESSION: Similar abnormal appearance of the lungs with airspace opacity likely representing the known pulmonary mass in the right lower lobe, extensive peribronchial airspace consolidation versus lymphangitic spread of tumor. Interval enlargement of right pleural effusion. Chronic interstitial lung scarring versus metastatic disease in the left upper lobe, stable. Electronically Signed   By: Fidela Salisbury M.D.   On: 01/29/2018 15:12    EKG: Independently reviewed. Sinus tachycardia wtihout ST changes.    Assessment/Plan  1.  Acute on chronic respiratory failure with hypoxia and hypercapnia:  Presents with respiratory distress, hypoxia, elevated CO2on VBG. Appears stablenow, will use CPAP overnight, but she appears to be much improved justwith albuterol, and her PH is normal, so will defer BiPAP.   2.  Pneumonia, right lower lobe:  -Continue IV vancomyin and Azactam -Check MRSA swab and de-escalate vanc if able -Trend procalcitonins -Obtain sputum cutlure -Check CT chest  3.  Pleural effusion right:  Probably malignant.  Possibly infected, but not unstable empyema. -Obtain CT chest -If large, will obtain Thoracentesis -Check  cell counts, cultures, LDH and protein  4.  COPD exacerbation COPD Gold D:  -Solu-Medrol 40 IV daily -GI ppx ordered -Continue nebulized bronchodilators, scheduled and p.r.n. -Continue home supplemental oxygen -Continue PPI -Continue Anoro -Continue antihistamines and steroid nasal spray  5.  Squamous cell cancer of the lung:  On Keytruda.  Recent PET showed hypermetabolic pleural based lesions, local mets in lung, and nearby hypermetabolic lymph nodes. -Msg sent to Oncology via inbasket  6.  Chronic diastolic CHF:  Appears Euvolemic.  Not on diuretics at home. -Check BNP  7.  Iron deficiency anemia:  Hgb stable at baseline  8. Hypertension: Hypertensive at admission -Continue amlodipine, losartan, HCTZ, metoprolol  9.  Obstructive sleep apnea: -CPAP at night      DVT prophylaxis: Lovenox  Code Status: FULL  Family Communication: Sister at bedside  Disposition Plan: Anticipate IV antibiotics and steroids.  Thoracentesis and Oncology consult.  PT ordered Consults called: Oncology added to treatment team Admission status: INPATIENT    Medical decision making: Patient seen at 4:51 PM on 02/19/2018.  The patient was discussed with Dr. Ralene Bathe.  What exists of the patient's chart was reviewed in depth and summarized above.  Clinical condition: improved considerably, stable on supplemental O2with moderate tachycardia, mentation good, stable for tele bed.       At the time of admission, it appears that the appropriate admission status for this patient is INPATIENT. This is judged to be reasonable and necessary in order to provide the required intensity of service to ensure the patient's safety given the presenting symptoms, physical exam findings, and initial radiographic and laboratory data in the context of their chronic comorbidities.  Together, these circumstances are felt to place her at high risk for further clinical deterioration threatening life, limb, or organ.    Patient requires inpatient status due to high intensity of service, high risk for further deterioration and high frequency of surveillance required because of this acute illness that poses a threat to life, limb or bodily function.  I certify that at the point of admission it is my clinical judgment that the patient will require inpatient hospital care spanning beyond 2 midnights from the point of admission and that early discharge would result in  unnecessary risk of decompensation and readmission or threat to life, limb or bodily function.         Edwin Dada [facility] Pager [phone]

## 2018-02-24 NOTE — ED Notes (Signed)
Bed: RESA Expected date:  Expected time:  Means of arrival:  Comments: Ems sob

## 2018-02-24 NOTE — ED Notes (Signed)
Called Pharmacy to verify Benadryl and Klonopin.  Tech stated, "I'll let the pharmacist know".

## 2018-02-24 NOTE — ED Provider Notes (Signed)
[facility] DEPT Provider Note   CSN: [id number] Arrival date & time: 02/23/2018  1418     History   Chief Complaint Chief Complaint  Patient presents with  . Shortness of Breath    HPI Alisha Fisher is a 72 y.o. female.  The history is provided by the patient. No language interpreter was used.  Shortness of Breath     Alisha Fisher is a 72 y.o. female who presents to the Emergency Department complaining of sob. She presents to the emergency department for evaluation of shortness of breath. She has a history of COPD on 3 L nasal cannula at baseline as well as lung cancer. Last week she began to treat her therapy. Over the last few days she's had increased shortness of breath with cough. She reports a temperature to 100 axillary at home today. She also has chest pain with coughing, nausea, vomiting. No leg swelling or pain. No abdominal pain. On EMS arrival her sats were 80% on 3 L nasal cannula. She received Solu-Medrol, zofran, albuterol and Atrovent prior to ED arrival.  Past Medical History:  Diagnosis Date  . Cancer ([place])    lung cancer  . COPD, severe ([place])   . Goals of care, counseling/discussion 02/12/2018  . HTN (hypertension)   . Iron deficiency anemia 11/07/2017  . Panic attack   . Pneumonia   . Pulmonary emphysema ([place])   . Sleep apnea    no cpap needs more recent sleep study  . SOB (shortness of breath)     Patient Active Problem List   Diagnosis Date Noted  . COPD with acute exacerbation ([place]) 02/09/2018  . Pleural effusion 02/16/2018  . Community acquired pneumonia 02/21/2018  . Community acquired pneumonia of right lower lobe of lung ([place]) 02/07/2018  . Goals of care, counseling/discussion 02/12/2018  . Hilar mass   . Anxiety disorder 12/05/2017  . Iron deficiency anemia 11/07/2017  . OSA (obstructive sleep apnea) 10/17/2017  . Radiation pneumonitis ([place]) 10/17/2017  . Constipation   . Chronic diastolic heart failure ([place])  12/30/2016  . Squamous cell lung cancer, right ([place]) 12/25/2016  . Rash and nonspecific skin eruption   . Malignant neoplasm of bronchus of left upper lobe ([place]) 12/20/2016  . Chronic respiratory failure with hypoxia ([place]) 12/18/2016  . Essential hypertension 12/18/2016  . Anemia 12/18/2016  . Hypokalemia 12/16/2016  . Hypomagnesemia 12/16/2016  . Intraperitoneal abscess s/p perc drainage 12/14/2016 12/15/2016  . Acute appendicitis with perforation and peritoneal abscess 12/03/2016  . Obesity 05/25/2012  . Smoker 05/25/2012  . COPD (chronic obstructive pulmonary disease) ([place]) 05/25/2012    Past Surgical History:  Procedure Laterality Date  . CATARACT EXTRACTION    . ENDOBRONCHIAL ULTRASOUND Bilateral 12/17/2016   Procedure: ENDOBRONCHIAL ULTRASOUND;  Surgeon: Javier Glazier, MD;  Location: [facility] ENDOSCOPY;  Service: Cardiopulmonary;  Laterality: Bilateral;  . IR GENERIC HISTORICAL  12/21/2016   IR SINUS/FIST TUBE CHK-NON GI 12/21/2016 [facility]  . IR GENERIC HISTORICAL  12/21/2016   IR US GUIDE BX ASP/DRAIN 12/21/2016 Corrie Mckusick, DO [facility]  . IR RADIOLOGIST EVAL & MGMT  01/17/2017  . VIDEO BRONCHOSCOPY WITH ENDOBRONCHIAL ULTRASOUND N/A 12/10/2017   Procedure: VIDEO BRONCHOSCOPY WITH ENDOBRONCHIAL ULTRASOUND;  Surgeon: Collene Gobble, MD;  Location: [facility] OR;  Service: Thoracic;  Laterality: N/A;  . WISDOM TOOTH EXTRACTION       OB History   None      Home Medications    Prior to Admission medications  Medication Sig Start Date End Date Taking? Authorizing Provider  ADVAIR DISKUS 250-50 MCG/DOSE AEPB Inhale 1 puff into the lungs 2 (two) times daily. 12/10/17  Yes Collene Gobble, MD  albuterol (PROVENTIL HFA;VENTOLIN HFA) 108 (90 Base) MCG/ACT inhaler Inhale 2 puffs into the lungs every 6 (six) hours as needed for wheezing or shortness of breath. 06/20/17  Yes Javier Glazier, MD  amLODipine (NORVASC) 10 MG tablet Take 1 tablet (10 mg total) daily by mouth. 08/06/17   Yes Belva Crome, MD  ANORO ELLIPTA 62.5-25 MCG/INH AEPB Inhale 1 puff into the lungs daily.  02/14/18  Yes [provider]  clonazePAM (KLONOPIN) 0.5 MG tablet Take 1 tablet (0.5 mg total) by mouth 2 (two) times daily as needed for anxiety. 12/05/17  Yes Collene Gobble, MD  diphenhydrAMINE (BENADRYL) 25 mg capsule Take 25 mg by mouth every 4 (four) hours as needed for itching or allergies.   Yes [provider]  docusate sodium (COLACE) 100 MG capsule Take 100 mg by mouth 2 (two) times daily as needed for mild constipation.   Yes [provider]  fluticasone (FLONASE) 50 MCG/ACT nasal spray Place 2 sprays into both nostrils daily as needed for allergies. 12/10/17  Yes Collene Gobble, MD  losartan-hydrochlorothiazide (HYZAAR) 100-25 MG tablet Take 1 tablet daily by mouth. 08/06/17  Yes Belva Crome, MD  metoprolol succinate (TOPROL-XL) 100 MG 24 hr tablet Take 2 tablets (200 mg total) by mouth daily. Patient taking differently: Take 150 mg by mouth daily. Take 1.5 tablets (150 mg) daily 12/10/17  Yes Byrum, Rose Fillers, MD  HYDROcodone-homatropine [facility]) 5-1.5 MG/5ML syrup Take 5 mLs by mouth every 6 (six) hours as needed for cough. 02/19/18   Volanda Napoleon, MD  loratadine (CLARITIN) 10 MG tablet Take 1 tablet (10 mg total) by mouth daily as needed for allergies. 12/10/17   Collene Gobble, MD  ondansetron (ZOFRAN) 8 MG tablet Take 1 tablet (8 mg total) by mouth 2 (two) times daily as needed (Nausea or vomiting). 02/19/18   Volanda Napoleon, MD  pantoprazole (PROTONIX) 40 MG tablet Take 1 tablet (40 mg total) by mouth 2 (two) times daily. 02/19/18   Volanda Napoleon, MD  prochlorperazine (COMPAZINE) 10 MG tablet Take 1 tablet (10 mg total) by mouth every 6 (six) hours as needed (Nausea or vomiting). 02/19/18   Volanda Napoleon, MD    Family History Family History  Problem Relation Age of Onset  . Allergies Unknown   . COPD Cousin   . Breast cancer Mother   . Diabetes  Father   . Diabetes Son     Social History Social History   Tobacco Use  . Smoking status: Former Smoker    Packs/day: 0.50    Years: 50.00    Pack years: 25.00    Types: Cigarettes    Last attempt to quit: 11/01/2016    Years since quitting: 1.3  . Smokeless tobacco: Never Used  Substance Use Topics  . Alcohol use: No  . Drug use: No     Allergies   Ciprofloxacin; Flagyl [metronidazole]; and Zosyn [piperacillin sod-tazobactam so]   Review of Systems Review of Systems  Respiratory: Positive for shortness of breath.   All other systems reviewed and are negative.    Physical Exam Updated Vital Signs BP (!) 130/57   Pulse (!) 125   Temp 98.4 F (36.9 C) (Oral)   Resp (!) 21   Ht 5\' 7"  (  1.702 m)   Wt 104.3 kg (230 lb)   SpO2 96%   BMI 36.02 kg/m   Physical Exam  Constitutional: She is oriented to person, place, and time. She appears well-developed and well-nourished.  HENT:  Head: Normocephalic and atraumatic.  Cardiovascular: Regular rhythm.  No murmur heard. tachycardic  Pulmonary/Chest:  Tachypnea with decreased air movement in bilateral bases, occasional wheezes in upper lung fields.   Abdominal: Soft. There is no tenderness. There is no rebound and no guarding.  Musculoskeletal: She exhibits no edema or tenderness.  Neurological: She is alert and oriented to person, place, and time.  Mildly confused  Skin: Skin is warm and dry.  Psychiatric: She has a normal mood and affect. Her behavior is normal.  Nursing note and vitals reviewed.    ED Treatments / Results  Labs (all labs ordered are listed, but only abnormal results are displayed) Labs Reviewed  COMPREHENSIVE METABOLIC PANEL - Abnormal; Notable for the following components:      Result Value   Chloride 89 (*)    CO2 34 (*)    Glucose, Bld 129 (*)    Albumin 3.0 (*)    All other components within normal limits  CBC WITH DIFFERENTIAL/PLATELET - Abnormal; Notable for the following  components:   WBC 16.9 (*)    RBC 3.75 (*)    Hemoglobin 10.9 (*)    HCT 35.4 (*)    Platelets 490 (*)    Neutro Abs 15.3 (*)    All other components within normal limits  BLOOD GAS, VENOUS - Abnormal; Notable for the following components:   pCO2, Ven 70.4 (*)    pO2, Ven 54.6 (*)    Bicarbonate 37.2 (*)    Acid-Base Excess 9.7 (*)    All other components within normal limits  I-STAT CHEM 8, ED - Abnormal; Notable for the following components:   Sodium 133 (*)    Chloride 87 (*)    Glucose, Bld 127 (*)    Calcium, Ion 1.13 (*)    TCO2 36 (*)    All other components within normal limits  CULTURE, BLOOD (ROUTINE X 2)  CULTURE, BLOOD (ROUTINE X 2)  URINE CULTURE  CULTURE, EXPECTORATED SPUTUM-ASSESSMENT  GRAM STAIN  MRSA PCR SCREENING  URINALYSIS, ROUTINE W REFLEX MICROSCOPIC  STREP PNEUMONIAE URINARY ANTIGEN  PROCALCITONIN  LEGIONELLA PNEUMOPHILA SEROGP 1 UR AG  HIV ANTIBODY (ROUTINE TESTING)  PROCALCITONIN  COMPREHENSIVE METABOLIC PANEL  CBC  BRAIN NATRIURETIC PEPTIDE  LACTATE DEHYDROGENASE  I-STAT CG4 LACTIC ACID, ED  I-STAT TROPONIN, ED    EKG EKG Interpretation  Date/Time:  Monday Feb 24 2018 15:02:52 EDT Ventricular Rate:  141 PR Interval:    QRS Duration: 89 QT Interval:  271 QTC Calculation: 415 R Axis:   61 Text Interpretation:  Sinus tachycardia Supraventricular bigeminy Consider right atrial enlargement Repolarization abnormality, prob rate related Confirmed by Quintella Reichert [phone]) on 02/09/2018 5:32:41 PM   Radiology Dg Chest Port 1 View  Result Date: 02/04/2018 CLINICAL DATA:  Shortness of breath. EXAM: PORTABLE CHEST 1 VIEW COMPARISON:  Chest CT 02/07/2018 FINDINGS: Cardiomediastinal silhouette is normal. Calcific atherosclerotic disease and tortuosity of the aorta. There is no evidence of pneumothorax. Similar appearance of right hemithorax with airspace opacity in the right lower lobe, likely representing tumor and diffuse peribronchovascular  infiltrate versus lymphangitic spread of tumor. Interval development of right pleural effusion. Chronic interstitial lung changes versus metastatic disease in the left upper lobe of the lung also stable. Osseous structures  are without acute abnormality. Soft tissues are grossly normal. IMPRESSION: Similar abnormal appearance of the lungs with airspace opacity likely representing the known pulmonary mass in the right lower lobe, extensive peribronchial airspace consolidation versus lymphangitic spread of tumor. Interval enlargement of right pleural effusion. Chronic interstitial lung scarring versus metastatic disease in the left upper lobe, stable. Electronically Signed   By: Fidela Salisbury M.D.   On: 02/20/2018 15:12    Procedures Procedures (including critical care time)  Medications Ordered in ED Medications  vancomycin (VANCOCIN) 2,000 mg in sodium chloride 0.9 % 500 mL IVPB (2,000 mg Intravenous New Bag/Given 02/18/2018 1621)  umeclidinium-vilanterol (ANORO ELLIPTA) 62.5-25 MCG/INH 1 puff (1 puff Inhalation Not Given 01/31/2018 1724)  clonazePAM (KLONOPIN) tablet 0.5 mg (has no administration in time range)  diphenhydrAMINE (BENADRYL) capsule 25 mg (has no administration in time range)  fluticasone (FLONASE) 50 MCG/ACT nasal spray 2 spray (has no administration in time range)  losartan-hydrochlorothiazide (HYZAAR) 100-25 MG per tablet 1 tablet (has no administration in time range)  amLODipine (NORVASC) tablet 10 mg (has no administration in time range)  metoprolol succinate (TOPROL-XL) 24 hr tablet 150 mg (has no administration in time range)  pantoprazole (PROTONIX) EC tablet 40 mg (has no administration in time range)  aztreonam (AZACTAM) 2 g in sodium chloride 0.9 % 100 mL IVPB (has no administration in time range)  albuterol (PROVENTIL) (2.5 MG/3ML) 0.083% nebulizer solution 2.5 mg (has no administration in time range)  albuterol (PROVENTIL) (2.5 MG/3ML) 0.083% nebulizer solution 2.5 mg  (has no administration in time range)  guaiFENesin (MUCINEX) 12 hr tablet 600 mg (has no administration in time range)  ondansetron (ZOFRAN) tablet 4 mg (has no administration in time range)    Or  ondansetron (ZOFRAN) injection 4 mg (has no administration in time range)  acetaminophen (TYLENOL) tablet 650 mg (has no administration in time range)    Or  acetaminophen (TYLENOL) suppository 650 mg (has no administration in time range)  0.9 %  sodium chloride infusion (has no administration in time range)  enoxaparin (LOVENOX) injection 40 mg (has no administration in time range)  methylPREDNISolone sodium succinate (SOLU-MEDROL) 40 mg/mL injection 40 mg (has no administration in time range)  vancomycin (VANCOCIN) 1,250 mg in sodium chloride 0.9 % 250 mL IVPB (has no administration in time range)  HYDROcodone-acetaminophen (NORCO/VICODIN) 5-325 MG per tablet 1 tablet (has no administration in time range)  acetaminophen (TYLENOL) tablet 650 mg (650 mg Oral Given 02/21/2018 1507)  aztreonam (AZACTAM) 2 g in sodium chloride 0.9 % 100 mL IVPB (0 g Intravenous Stopped 02/04/2018 1621)     Initial Impression / Assessment and Plan / ED Course  I have reviewed the triage vital signs and the nursing notes.  Pertinent labs & imaging results that were available during my care of the patient were reviewed by me and considered in my medical decision making (see chart for details).     Patient here for evaluation of increased shortness of breath, currently on Keytruda  therapy that was started on Friday. Patient in respiratory distress on ED arrival. On repeat assessment following continuous neb she is feeling improved and has improved aeration. She was treated with broad-spectrum antibiotics for presumed sepsis in setting of fever and immunosuppression. Hospitalist consulted for admission for further treatment.  Final Clinical Impressions(s) / ED Diagnoses   Final diagnoses:  Pleural effusion    ED  Discharge Orders    None       Quintella Reichert, MD 02/11/2018  1740  

## 2018-02-24 NOTE — ED Notes (Signed)
Pt. Waiting to Transport to CT. Anxiety meds given.

## 2018-02-25 ENCOUNTER — Other Ambulatory Visit: Payer: Self-pay

## 2018-02-25 DIAGNOSIS — Z792 Long term (current) use of antibiotics: Secondary | ICD-10-CM

## 2018-02-25 DIAGNOSIS — C787 Secondary malignant neoplasm of liver and intrahepatic bile duct: Secondary | ICD-10-CM

## 2018-02-25 DIAGNOSIS — F410 Panic disorder [episodic paroxysmal anxiety] without agoraphobia: Secondary | ICD-10-CM

## 2018-02-25 DIAGNOSIS — C7801 Secondary malignant neoplasm of right lung: Secondary | ICD-10-CM

## 2018-02-25 DIAGNOSIS — Z9225 Personal history of immunosupression therapy: Secondary | ICD-10-CM

## 2018-02-25 DIAGNOSIS — Z7952 Long term (current) use of systemic steroids: Secondary | ICD-10-CM

## 2018-02-25 DIAGNOSIS — C3412 Malignant neoplasm of upper lobe, left bronchus or lung: Secondary | ICD-10-CM

## 2018-02-25 DIAGNOSIS — J449 Chronic obstructive pulmonary disease, unspecified: Secondary | ICD-10-CM

## 2018-02-25 DIAGNOSIS — Z79899 Other long term (current) drug therapy: Secondary | ICD-10-CM

## 2018-02-25 DIAGNOSIS — N179 Acute kidney failure, unspecified: Secondary | ICD-10-CM

## 2018-02-25 DIAGNOSIS — C771 Secondary and unspecified malignant neoplasm of intrathoracic lymph nodes: Secondary | ICD-10-CM

## 2018-02-25 LAB — COMPREHENSIVE METABOLIC PANEL
ALK PHOS: 84 U/L (ref 38–126)
ALT: 22 U/L (ref 14–54)
AST: 14 U/L — AB (ref 15–41)
Albumin: 2.6 g/dL — ABNORMAL LOW (ref 3.5–5.0)
Anion gap: 14 (ref 5–15)
BUN: 31 mg/dL — AB (ref 6–20)
CALCIUM: 8.7 mg/dL — AB (ref 8.9–10.3)
CHLORIDE: 91 mmol/L — AB (ref 101–111)
CO2: 31 mmol/L (ref 22–32)
CREATININE: 1.77 mg/dL — AB (ref 0.44–1.00)
GFR calc Af Amer: 32 mL/min — ABNORMAL LOW (ref >60)
GFR calc non Af Amer: 28 mL/min — ABNORMAL LOW (ref >60)
Glucose, Bld: 105 mg/dL — ABNORMAL HIGH (ref 65–99)
Potassium: 5 mmol/L (ref 3.5–5.1)
SODIUM: 136 mmol/L (ref 135–145)
Total Bilirubin: 0.8 mg/dL (ref 0.3–1.2)
Total Protein: 7.1 g/dL (ref 6.5–8.1)

## 2018-02-25 LAB — CBC
HCT: 35.9 % — ABNORMAL LOW (ref 36.0–46.0)
Hemoglobin: 10.7 g/dL — ABNORMAL LOW (ref 12.0–15.0)
MCH: 29.2 pg (ref 26.0–34.0)
MCHC: 29.8 g/dL — ABNORMAL LOW (ref 30.0–36.0)
MCV: 97.8 fL (ref 78.0–100.0)
PLATELETS: 453 10*3/uL — AB (ref 150–400)
RBC: 3.67 MIL/uL — ABNORMAL LOW (ref 3.87–5.11)
RDW: 16 % — AB (ref 11.5–15.5)
WBC: 16.6 10*3/uL — ABNORMAL HIGH (ref 4.0–10.5)

## 2018-02-25 LAB — PROCALCITONIN: PROCALCITONIN: 0.61 ng/mL

## 2018-02-25 LAB — LACTATE DEHYDROGENASE: LDH: 159 U/L (ref 98–192)

## 2018-02-25 LAB — HIV ANTIBODY (ROUTINE TESTING W REFLEX): HIV Screen 4th Generation wRfx: NONREACTIVE

## 2018-02-25 LAB — BRAIN NATRIURETIC PEPTIDE: B Natriuretic Peptide: 170.4 pg/mL — ABNORMAL HIGH (ref 0.0–100.0)

## 2018-02-25 LAB — MRSA PCR SCREENING: MRSA by PCR: NEGATIVE

## 2018-02-25 MED ORDER — SODIUM CHLORIDE 0.9 % IV BOLUS
250.0000 mL | Freq: Once | INTRAVENOUS | Status: AC
  Administered 2018-02-26: 250 mL via INTRAVENOUS

## 2018-02-25 MED ORDER — LEVALBUTEROL HCL 0.63 MG/3ML IN NEBU: 0.6300 mg | INHALATION_SOLUTION | RESPIRATORY_TRACT | Status: DC

## 2018-02-25 MED ORDER — ALBUTEROL SULFATE (2.5 MG/3ML) 0.083% IN NEBU: 2.5000 mg | INHALATION_SOLUTION | Freq: Two times a day (BID) | RESPIRATORY_TRACT | Status: DC

## 2018-02-25 MED ORDER — IPRATROPIUM-ALBUTEROL 0.5-2.5 (3) MG/3ML IN SOLN
3.0000 mL | Freq: Four times a day (QID) | RESPIRATORY_TRACT | Status: DC | PRN
  Administered 2018-02-25: 3 mL via RESPIRATORY_TRACT
  Filled 2018-02-25: qty 3

## 2018-02-25 MED ORDER — METOPROLOL TARTRATE 5 MG/5ML IV SOLN
2.5000 mg | Freq: Once | INTRAVENOUS | Status: AC
  Administered 2018-02-26: 2.5 mg via INTRAVENOUS
  Filled 2018-02-25: qty 5

## 2018-02-25 MED ORDER — HEPARIN SODIUM (PORCINE) 5000 UNIT/ML IJ SOLN
5000.0000 [IU] | Freq: Three times a day (TID) | INTRAMUSCULAR | Status: DC
  Administered 2018-02-25 – 2018-02-27 (×6): 5000 [IU] via SUBCUTANEOUS
  Filled 2018-02-25 (×7): qty 1

## 2018-02-25 MED ORDER — SODIUM CHLORIDE 0.9 % IV SOLN
INTRAVENOUS | Status: DC
  Administered 2018-02-25 – 2018-02-26 (×3): via INTRAVENOUS

## 2018-02-25 MED ORDER — AZITHROMYCIN 500 MG IV SOLR
500.0000 mg | INTRAVENOUS | Status: AC
  Administered 2018-02-25 – 2018-03-03 (×7): 500 mg via INTRAVENOUS
  Filled 2018-02-25 (×7): qty 500

## 2018-02-25 MED ORDER — SODIUM CHLORIDE 0.9 % IV SOLN
1.0000 g | INTRAVENOUS | Status: DC
  Administered 2018-02-25 – 2018-02-27 (×3): 1 g via INTRAVENOUS
  Filled 2018-02-25 (×3): qty 1

## 2018-02-25 NOTE — Progress Notes (Signed)
Pharmacy Antibiotic Note  Alisha Fisher is a 72 y.o. female admitted on 02/22/2018 with pneumonia.  PMH significant for lung cancer & COPD.  CXR + PNA. Patient started on vancomycin and aztreonam  02/25/2018:  Evidence of AKI following administration IV contrast, vancomycin and ARB.    Febrile- Tmax 102.37F (at admission 5/27), afebrile sinc  WBC elevated at 16.9  Renal function at baseline - Scr 1.77  MRSA PCR neg  Plan:  History of maculopapular rash (per notes in 2018 when rxn documented) to zosyn (so not IgE mediated rxn).  Change antibiotics to ceftriaxone/azithromycin    Height: 5\' 7"  (170.2 cm) Weight: 230 lb (104.3 kg) IBW/kg (Calculated) : 61.6  Temp (24hrs), Avg:99 F (37.2 C), Min:97.7 F (36.5 C), Max:102.5 F (39.2 C)  Recent Labs  Lab 02/19/18 1213 02/26/2018 1454 02/25/18 0515  WBC 15.2* 16.9* 16.6*  CREATININE 1.00 0.91  1.00 1.77*  LATICACIDVEN  --  1.37  --     Estimated Creatinine Clearance: 36.7 mL/min (A) (by C-G formula based on SCr of 1.77 mg/dL (H)).    Allergies  Allergen Reactions  . Ciprofloxacin Rash and Other (See Comments)    Rash possibly caused by Cipro?  . Flagyl [Metronidazole] Rash and Other (See Comments)    Rash possibly caused by Flagyl?  Marland Kitchen Zosyn [Piperacillin Sod-Tazobactam So] Itching, Rash and Other (See Comments)    Has patient had a PCN reaction causing immediate rash, facial/tongue/throat swelling, SOB or lightheadedness with hypotension: No Has patient had a PCN reaction causing severe rash involving mucus membranes or skin necrosis: No Has patient had a PCN reaction that required hospitalization: No Has patient had a PCN reaction occurring within the last 10 years: Yes If all of the above answers are "NO", then may proceed with Cephalosporin use.      Thank you for allowing pharmacy to be a part of this patient's care.  Doreene Eland, PharmD, BCPS.   Pager: [phone] 02/25/2018 8:10 AM

## 2018-02-25 NOTE — Plan of Care (Signed)
  Problem: Elimination: Goal: Will not experience complications related to urinary retention Outcome: Progressing   Problem: Safety: Goal: Ability to remain free from injury will improve Outcome: Progressing   Problem: Skin Integrity: Goal: Risk for impaired skin integrity will decrease Outcome: Progressing   Problem: Education: Goal: Knowledge of disease or condition will improve Outcome: Progressing Goal: Knowledge of the prescribed therapeutic regimen will improve Outcome: Progressing   Problem: Respiratory: Goal: Ability to maintain a clear airway will improve Outcome: Progressing

## 2018-02-25 NOTE — Progress Notes (Signed)
Referring Physician(s): Ennever,P  Supervising Physician: Corrie Mckusick  Patient Status:  [facility] - In-pt  Chief Complaint:  Lung cancer  Subjective: Patient familiar to IR service from prior right lower quadrant and left pelvic abscess drain placements in 2018.  She has a history of recurrent metastatic non-small cell lung cancer, severe COPD, obstructive sleep apnea, pneumonia as well as poor venous access.  Request received from oncology for Port-A-Cath placement.  Patient currently lethargic and not very interactive.  Currently afebrile but had temp of 102.5 yesterday afternoon.  Currently on azithromycin and Rocephin.   Allergies: Ciprofloxacin; Flagyl [metronidazole]; and Zosyn [piperacillin sod-tazobactam so]  Medications: Prior to Admission medications   Medication Sig Start Date End Date Taking? Authorizing Provider  ADVAIR DISKUS 250-50 MCG/DOSE AEPB Inhale 1 puff into the lungs 2 (two) times daily. 12/10/17  Yes Collene Gobble, MD  albuterol (PROVENTIL HFA;VENTOLIN HFA) 108 (90 Base) MCG/ACT inhaler Inhale 2 puffs into the lungs every 6 (six) hours as needed for wheezing or shortness of breath. 06/20/17  Yes Javier Glazier, MD  amLODipine (NORVASC) 10 MG tablet Take 1 tablet (10 mg total) daily by mouth. 08/06/17  Yes Belva Crome, MD  ANORO ELLIPTA 62.5-25 MCG/INH AEPB Inhale 1 puff into the lungs daily.  02/14/18  Yes [provider]  clonazePAM (KLONOPIN) 0.5 MG tablet Take 1 tablet (0.5 mg total) by mouth 2 (two) times daily as needed for anxiety. 12/05/17  Yes Collene Gobble, MD  diphenhydrAMINE (BENADRYL) 25 mg capsule Take 25 mg by mouth every 4 (four) hours as needed for itching or allergies.   Yes [provider]  docusate sodium (COLACE) 100 MG capsule Take 100 mg by mouth 2 (two) times daily as needed for mild constipation.   Yes [provider]  fluticasone (FLONASE) 50 MCG/ACT nasal spray Place 2 sprays into both nostrils daily as  needed for allergies. 12/10/17  Yes Collene Gobble, MD  losartan-hydrochlorothiazide (HYZAAR) 100-25 MG tablet Take 1 tablet daily by mouth. 08/06/17  Yes Belva Crome, MD  metoprolol succinate (TOPROL-XL) 100 MG 24 hr tablet Take 2 tablets (200 mg total) by mouth daily. Patient taking differently: Take 150 mg by mouth daily. Take 1.5 tablets (150 mg) daily 12/10/17  Yes Byrum, Rose Fillers, MD  HYDROcodone-homatropine [facility]) 5-1.5 MG/5ML syrup Take 5 mLs by mouth every 6 (six) hours as needed for cough. 02/19/18   Volanda Napoleon, MD  loratadine (CLARITIN) 10 MG tablet Take 1 tablet (10 mg total) by mouth daily as needed for allergies. 12/10/17   Collene Gobble, MD  ondansetron (ZOFRAN) 8 MG tablet Take 1 tablet (8 mg total) by mouth 2 (two) times daily as needed (Nausea or vomiting). 02/19/18   Volanda Napoleon, MD  pantoprazole (PROTONIX) 40 MG tablet Take 1 tablet (40 mg total) by mouth 2 (two) times daily. 02/19/18   Volanda Napoleon, MD  prochlorperazine (COMPAZINE) 10 MG tablet Take 1 tablet (10 mg total) by mouth every 6 (six) hours as needed (Nausea or vomiting). 02/19/18   Volanda Napoleon, MD     Vital Signs: BP (!) 99/55 (BP Location: Right Arm)   Pulse 78   Temp 98.1 F (36.7 C) (Axillary)   Resp 20   Ht 5\' 7"  (1.702 m)   Wt 230 lb (104.3 kg)   SpO2 95%   BMI 36.02 kg/m   Physical Exam patient lethargic, not very interactive; chest with distant breath sounds bilaterally, occasional  wheeze noted; heart with regular rate and rhythm.  Abdomen obese, soft, positive bowel sounds, not significantly tender.  Trace to 1+ bilateral lower extremity edema  Imaging: Ct Chest W Contrast  Result Date: 02/23/2018 CLINICAL DATA:  Dyspnea. EXAM: CT CHEST WITH CONTRAST TECHNIQUE: Multidetector CT imaging of the chest was performed during intravenous contrast administration. CONTRAST:  23mL OMNIPAQUE IOHEXOL 300 MG/ML  SOLN COMPARISON:  PET scan of Feb 07, 2018.  CT scan of August 07, 2017.  FINDINGS: Cardiovascular: Atherosclerosis of thoracic aorta is noted without aneurysm formation. Coronary artery calcifications are noted. No pericardial effusion is noted. Mediastinum/Nodes: Thyroid gland and esophagus are unremarkable. Subcarinal adenopathy measuring 3.5 cm is noted. 2.4 cm right paratracheal adenopathy is noted. 12 mm right axillary lymph node is noted. Lungs/Pleura: No pneumothorax is noted. Continued presence of large mass in right lower lobe consistent with malignancy. Pleural spread of tumor is also noted and unchanged. Radiation fibrosis is noted medially in right upper lobe. Stable scarring or inflammation is noted anteriorly in the left upper lobe. Upper Abdomen: No acute abnormality. Musculoskeletal: No chest wall abnormality. No acute or significant osseous findings. IMPRESSION: Continued presence of large right lower lobe mass with pleural spread is noted consistent with history of lung cancer. 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 is well. Stable scarring or inflammation is noted anteriorly in the left upper lobe. Right paratracheal and subcarinal and right axillary adenopathy is noted consistent with metastatic disease. Coronary artery calcifications are noted suggesting coronary artery disease. Aortic Atherosclerosis (ICD10-I70.0). Electronically Signed   By: Marijo Conception, M.D.   On: 02/12/2018 19:10   Dg Chest Port 1 View  Result Date: 02/02/2018 CLINICAL DATA:  Shortness of breath. EXAM: PORTABLE CHEST 1 VIEW COMPARISON:  Chest CT 02/07/2018 FINDINGS: Cardiomediastinal silhouette is normal. Calcific atherosclerotic disease and tortuosity of the aorta. There is no evidence of pneumothorax. Similar appearance of right hemithorax with airspace opacity in the right lower lobe, likely representing tumor and diffuse peribronchovascular infiltrate versus lymphangitic spread of tumor. Interval development of right pleural effusion. Chronic interstitial lung changes  versus metastatic disease in the left upper lobe of the lung also stable. Osseous structures are without acute abnormality. Soft tissues are grossly normal. IMPRESSION: Similar abnormal appearance of the lungs with airspace opacity likely representing the known pulmonary mass in the right lower lobe, extensive peribronchial airspace consolidation versus lymphangitic spread of tumor. Interval enlargement of right pleural effusion. Chronic interstitial lung scarring versus metastatic disease in the left upper lobe, stable. Electronically Signed   By: Fidela Salisbury M.D.   On: 02/26/2018 15:12    Labs:  CBC: Recent Labs    01/27/18 1252 02/19/18 1213 02/08/2018 1454 02/25/18 0515  WBC 16.2* 15.2* 16.9* 16.6*  HGB 10.8* 10.9* 10.9*  12.2 10.7*  HCT 34.3* 35.3 35.4*  36.0 35.9*  PLT 427* 406* 490* 453*    COAGS: Recent Labs    12/10/17 0635  INR 1.00  APTT 30    BMP: Recent Labs    12/10/17 0635 01/27/18 1252 02/19/18 1213 02/01/2018 1454 02/25/18 0515  NA 136 134* 137 136  133* 136  K 4.1 4.5 4.5 4.1  4.1 5.0  CL 98* 91* 94* 89*  87* 91*  CO2 27 32* 34* 34* 31  GLUCOSE 99 104 113 129*  127* 105*  BUN 16 15 15 17  14  31*  CALCIUM 8.7* 9.8 9.4 9.2 8.7*  CREATININE 0.84 0.95 1.00 0.91  1.00 1.77*  GFRNONAA >60 59*  --  >60 28*  GFRAA >60 >60  --  >60 32*    LIVER FUNCTION TESTS: Recent Labs    01/27/18 1252 02/19/18 1213 02/09/2018 1454 02/25/18 0515  BILITOT 0.6 0.7 0.6 0.8  AST 33 19 18 14*  ALT 38 32 24 22  ALKPHOS 116 97* 95 84  PROT 7.8 7.6 7.8 7.1  ALBUMIN 2.7* 2.9* 3.0* 2.6*    Assessment and Plan: Patient with history of recurrent metastatic non-small cell lung cancer on immunotherapy, COPD, pneumonia, poor venous access.  Request received for Port-A-Cath placement.  Antibiotic therapy recently started for pneumonia.  Temperature 102.5 yesterday afternoon.  WBC 16.6.  Patient currently lethargic and not very interactive.  Plans for potential  Port-A-Cath placement were discussed briefly with sister ,[name].  Would prefer for patient to be afebrile for 2 to 3 days before placing Port-A-Cath. Will cont to monitor; heparin injections will need to be held prior to procedure   Electronically Signed: D. Rowe Robert, PA-C 02/25/2018, 3:10 PM   I spent a total of 20 minutes at the the patient's bedside AND on the patient's hospital floor or unit, greater than 50% of which was counseling/coordinating care for Port-A-Cath placement    Patient ID: Alisha Fisher, female   DOB: [birth year], 72 y.o.   MRN: [id number]

## 2018-02-25 NOTE — Progress Notes (Signed)
Pt was attempting to eat dinner and starting coughing and then gagging. Pt vomited large amount of green to yellow liquid. MD paged to make aware

## 2018-02-25 NOTE — Progress Notes (Addendum)
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  Patient had increased chest congestion, cough and fatigue.  She was started on Keytruda last Thursday by her oncologist.  Patient was found to be hypoxic on presentation to the emergency department.  Also noted to have fever and tachycardia.  Patient was admitted for COPD exacerbation and possible pneumonia.  Assessment & Plan:   Principal Problem:   Community acquired pneumonia Active Problems:   Obesity   COPD (chronic obstructive pulmonary disease) ([place])   Essential hypertension   Squamous cell lung cancer, right (HCC)   Chronic diastolic heart failure (HCC)   OSA (obstructive sleep apnea)   Iron deficiency anemia   Anxiety disorder   COPD with acute exacerbation (HCC)   Pleural effusion   Community acquired pneumonia of right lower lobe of lung ([place])   AKI (acute kidney injury) ([place])   Acute on chronic respiratory failure with hypoxia and hypercapnea: Most likely secondary to COPD exacerbation and possible pneumonia.  Presented with respiratory distress and hypoxia.  Respiratory status has improved.  Continue supplemental oxygen as needed.  Possible Pneumonia: Chest x-ray and CT imagings did not show  Pneumonia clearly but there is a possibility of postoperative pneumonia.  Has leukocytosis.  Continue with ceftriaxone and azithromycin.  We will follow-up cultures.  COPD exacerbation: History of advanced COPD.  On Solu-Medrol.  Continue bronchodilators and supplemental oxygen as  needed.  Squamous cell carcinoma of the right  lung: On Keytruda.  Oncology following.  Recent PET showed hypermetabolic pleural-based lesions, local mets in the lung and nearby hypermetabolic lymph nodes.  CT chest showed continued presence of large right lower lobe mass with pleural spread .Oncology following here.  Plan for Port-A-Cath placement.  Acute kidney injury: Kidney function worsened this morning.  Will start on gentle IV fluids.  Chronic diastolic CHF: Appears dehydrated now .  Not on diuretics at home.    Iron deficiency anemia: Currently hemoglobin is stable at baseline.  Hypertension: Blood pressure on the higher side on presentation.  Continue amlodipine and metoprolol.  Losartan, hydrochlorothiazide held due to acute kidney injury.  Obstructive sleep apnea:Continue  CPAP at night  Deconditioning/debility: Patient evaluated by PT and recommended skilled nursing facility on discharge.  Social worker consulted.  DVT prophylaxis: Heparin [place] Code Status: Full Family Communication: None present at the bedside Disposition Plan: Skilled nursing facility after improvement in respiratory status   Consultants: Oncology  Procedures: Being planned for Port-A-Cath placement  Antimicrobials: Ceftriaxone and azithromycin since 02/25/2018  Subjective: Patient seen and examined at bedside this morning.  Found to be weak sleepy .Respiratory status looks to have improved.  Was not in any kind of respiratory distress this morning.  Objective: Vitals:   02/25/18 0618 02/25/18 0815 02/25/18 1017 02/25/18 1402  BP: 133/67   (!) 99/55  Pulse: 92  88 78  Resp: 20   20  Temp: 98.3 F (36.8 C)   98.1 F (36.7 C)  TempSrc: Oral   Axillary  SpO2: 97% 95%  95%  Weight:      Height:  Intake/Output Summary (Last 24 hours) at 02/25/2018 1518 Last data filed at 02/25/2018 1215 Gross per 24 hour  Intake 940 ml  Output -  Net 940 ml   Filed Weights   02/03/2018 1437  Weight:  104.3 kg (230 lb)    Examination:  General exam: Not in distress,obese, chronically ill HEENT:PERRL,Oral mucosa moist, Ear/Nose normal on gross exam Respiratory system: Bilateral decreased air entry Cardiovascular system: S1 & S2 heard, RRR. No JVD, murmurs, rubs, gallops or clicks. No pedal edema. Gastrointestinal system: Abdomen is nondistended, soft and nontender. No organomegaly or masses felt. Normal bowel sounds heard. Central nervous system: Alert and awake. No focal neurological deficits. Extremities: No edema, no clubbing ,no cyanosis, distal peripheral pulses palpable. Skin: No rashes, lesions or ulcers,no icterus ,no pallor    Data Reviewed: I have personally reviewed following labs and imaging studies  CBC: Recent Labs  Lab 02/19/18 1213 02/02/2018 1454 02/25/18 0515  WBC 15.2* 16.9* 16.6*  NEUTROABS 14.1* 15.3*  --   HGB 10.9* 10.9*  12.2 10.7*  HCT 35.3 35.4*  36.0 35.9*  MCV 95.9 94.4 97.8  PLT 406* 490* 209*   Basic Metabolic Panel: Recent Labs  Lab 02/19/18 1213 02/02/2018 1454 02/25/18 0515  NA 137 136  133* 136  K 4.5 4.1  4.1 5.0  CL 94* 89*  87* 91*  CO2 34* 34* 31  GLUCOSE 113 129*  127* 105*  BUN 15 17  14  31*  CREATININE 1.00 0.91  1.00 1.77*  CALCIUM 9.4 9.2 8.7*   GFR: Estimated Creatinine Clearance: 36.7 mL/min (A) (by C-G formula based on SCr of 1.77 mg/dL (H)). Liver Function Tests: Recent Labs  Lab 02/19/18 1213 02/19/2018 1454 02/25/18 0515  AST 19 18 14*  ALT 32 24 22  ALKPHOS 97* 95 84  BILITOT 0.7 0.6 0.8  PROT 7.6 7.8 7.1  ALBUMIN 2.9* 3.0* 2.6*   No results for input(s): LIPASE, AMYLASE in the last 168 hours. No results for input(s): AMMONIA in the last 168 hours. Coagulation Profile: No results for input(s): INR, PROTIME in the last 168 hours. Cardiac Enzymes: No results for input(s): CKTOTAL, CKMB, CKMBINDEX, TROPONINI in the last 168 hours. BNP (last 3 results) No results for input(s): PROBNP in the last 8760  hours. HbA1C: No results for input(s): HGBA1C in the last 72 hours. CBG: No results for input(s): GLUCAP in the last 168 hours. Lipid Profile: No results for input(s): CHOL, HDL, LDLCALC, TRIG, CHOLHDL, LDLDIRECT in the last 72 hours. Thyroid Function Tests: No results for input(s): TSH, T4TOTAL, FREET4, T3FREE, THYROIDAB in the last 72 hours. Anemia Panel: No results for input(s): VITAMINB12, FOLATE, FERRITIN, TIBC, IRON, RETICCTPCT in the last 72 hours. Sepsis Labs: Recent Labs  Lab 02/14/2018 1454 02/08/2018 1925 02/25/18 0515  PROCALCITON  --  0.53 0.61  LATICACIDVEN 1.37  --   --     Recent Results (from the past 240 hour(s))  Blood Culture (routine x 2)     Status: None (Preliminary result)   Collection Time: 02/06/2018  2:47 PM  Result Value Ref Range Status   Specimen Description   Final    BLOOD RIGHT ANTECUBITAL Performed at [facility], [place] [address] [place], [place] [address]    Special Requests   Final    BOTTLES DRAWN AEROBIC AND ANAEROBIC Blood Culture adequate volume Performed at [place] [address] [place], [place] [address]    Culture   Final    NO  GROWTH < 24 HOURS Performed at [facility], [place] [address] [place], [place] [address]    Report Status PENDING  Incomplete  Blood Culture (routine x 2)     Status: None (Preliminary result)   Collection Time: 02/04/2018  2:48 PM  Result Value Ref Range Status   Specimen Description   Final    BLOOD LEFT FOREARM Performed at [place] [address] [place], [place] [address]    Special Requests   Final    BOTTLES DRAWN AEROBIC AND ANAEROBIC Blood Culture adequate volume Performed at [place] [address] [place], [place] [address]    Culture   Final    NO GROWTH < 24 HOURS Performed at [place] [address], [place], [place] [address]    Report Status PENDING  Incomplete  MRSA PCR  Screening     Status: None   Collection Time: 02/20/2018  4:50 PM  Result Value Ref Range Status   MRSA by PCR NEGATIVE NEGATIVE Final    Comment:        The GeneXpert MRSA Assay (FDA approved for NASAL specimens only), is one component of a comprehensive MRSA colonization surveillance program. It is not intended to diagnose MRSA infection nor to guide or monitor treatment for MRSA infections. Performed at [facility], [place] [address] [place], [place] [address]          Radiology Studies: Ct Chest W Contrast  Result Date: 02/15/2018 CLINICAL DATA:  Dyspnea. EXAM: CT CHEST WITH CONTRAST TECHNIQUE: Multidetector CT imaging of the chest was performed during intravenous contrast administration. CONTRAST:  31mL OMNIPAQUE IOHEXOL 300 MG/ML  SOLN COMPARISON:  PET scan of Feb 07, 2018.  CT scan of August 07, 2017. FINDINGS: Cardiovascular: Atherosclerosis of thoracic aorta is noted without aneurysm formation. Coronary artery calcifications are noted. No pericardial effusion is noted. Mediastinum/Nodes: Thyroid gland and esophagus are unremarkable. Subcarinal adenopathy measuring 3.5 cm is noted. 2.4 cm right paratracheal adenopathy is noted. 12 mm right axillary lymph node is noted. Lungs/Pleura: No pneumothorax is noted. Continued presence of large mass in right lower lobe consistent with malignancy. Pleural spread of tumor is also noted and unchanged. Radiation fibrosis is noted medially in right upper lobe. Stable scarring or inflammation is noted anteriorly in the left upper lobe. Upper Abdomen: No acute abnormality. Musculoskeletal: No chest wall abnormality. No acute or significant osseous findings. IMPRESSION: Continued presence of large right lower lobe mass with pleural spread is noted consistent with history of lung cancer. 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 is well. Stable scarring or inflammation is noted anteriorly in the left upper lobe. Right  paratracheal and subcarinal and right axillary adenopathy is noted consistent with metastatic disease. Coronary artery calcifications are noted suggesting coronary artery disease. Aortic Atherosclerosis (ICD10-I70.0). Electronically Signed   By: Marijo Conception, M.D.   On: 02/11/2018 19:10   Dg Chest Port 1 View  Result Date: 02/22/2018 CLINICAL DATA:  Shortness of breath. EXAM: PORTABLE CHEST 1 VIEW COMPARISON:  Chest CT 02/07/2018 FINDINGS: Cardiomediastinal silhouette is normal. Calcific atherosclerotic disease and tortuosity of the aorta. There is no evidence of pneumothorax. Similar appearance of right hemithorax with airspace opacity in the right lower lobe, likely representing tumor and diffuse peribronchovascular infiltrate versus lymphangitic spread of tumor. Interval development of right pleural effusion. Chronic interstitial lung changes versus metastatic disease in the left upper lobe of the lung also stable. Osseous  structures are without acute abnormality. Soft tissues are grossly normal. IMPRESSION: Similar abnormal appearance of the lungs with airspace opacity likely representing the known pulmonary mass in the right lower lobe, extensive peribronchial airspace consolidation versus lymphangitic spread of tumor. Interval enlargement of right pleural effusion. Chronic interstitial lung scarring versus metastatic disease in the left upper lobe, stable. Electronically Signed   By: Fidela Salisbury M.D.   On: 02/27/2018 15:12        Scheduled Meds: . albuterol  2.5 mg Nebulization BID  . amLODipine  10 mg Oral Daily  . guaiFENesin  600 mg Oral BID  . heparin injection (subcutaneous)  5,000 Units Subcutaneous Q8H  . methylPREDNISolone (SOLU-MEDROL) injection  40 mg Intravenous Daily  . metoprolol succinate  150 mg Oral Daily  . pantoprazole  40 mg Oral BID  . umeclidinium-vilanterol  1 puff Inhalation Daily   Continuous Infusions: . sodium chloride 75 mL/hr at 02/25/18 1019  .  azithromycin Stopped (02/25/18 1443)  . cefTRIAXone (ROCEPHIN)  IV Stopped (02/25/18 1235)     LOS: 1 day    Time spent: 35 mins.More than 50% of that time was spent in counseling and/or coordination of care.      Shelly Coss, MD [facility] Pager [phone]  If 7PM-7AM, please contact night-coverage [email or web address] Password [facility] 02/25/2018, 3:18 PM

## 2018-02-25 NOTE — Consult Note (Signed)
Referral MD  Reason for Referral: Metastatic lung cancer  Chief Complaint  Patient presents with  . Shortness of Breath  : My breathing got worse  HPI: Alisha Fisher is well-known to me.  She is a very nice 72 year old white female.  She has bad COPD from past smoking.  She has a history of recurrent non-small cell lung cancer.  We started her on immunotherapy with pembrolizumab last week.  She was admitted because of worsening shortness of breath.  She had a CT scan done which showed the large right lung mass.  She may have pneumonia.  She is on IV antibiotics.  I think one issue that we can take care of while she is in the hospital that she has very poor IV access.  I think that a Port-A-Cath would be a good idea for her.  I will see if interventional radiology can do this for [place].  Her labs that were done today show a creatinine of 1.77.  Her potassium is 5.  Her chloride is 91.  Her albumin is 2.6.  Her white cell count 16.6.  Hemoglobin 10.7.  Platelet count 453,000.  She has had no bleeding.  There is no productive sputum.  Her appetite is marginal.  She has had some leg swelling.  She had a low-grade temperature at home.  Again, her problem outside of the lung cancer that she does have really significant COPD.  Overall, her performance status is ECOG 2.    Past Medical History:  Diagnosis Date  . Cancer ([place])    lung cancer  . COPD, severe ([place])   . Goals of care, counseling/discussion 02/12/2018  . HTN (hypertension)   . Iron deficiency anemia 11/07/2017  . Panic attack   . Pneumonia   . Pulmonary emphysema ([place])   . Sleep apnea    no cpap needs more recent sleep study  . SOB (shortness of breath)   :  Past Surgical History:  Procedure Laterality Date  . CATARACT EXTRACTION    . ENDOBRONCHIAL ULTRASOUND Bilateral 12/17/2016   Procedure: ENDOBRONCHIAL ULTRASOUND;  Surgeon: Javier Glazier, MD;  Location: [facility];  Service: Cardiopulmonary;  Laterality:  Bilateral;  . IR GENERIC HISTORICAL  12/21/2016   IR SINUS/FIST TUBE CHK-NON GI 12/21/2016 [facility]  . IR GENERIC HISTORICAL  12/21/2016   IR US GUIDE BX ASP/DRAIN 12/21/2016 Corrie Mckusick, DO [facility]  . IR RADIOLOGIST EVAL & MGMT  01/17/2017  . VIDEO BRONCHOSCOPY WITH ENDOBRONCHIAL ULTRASOUND N/A 12/10/2017   Procedure: VIDEO BRONCHOSCOPY WITH ENDOBRONCHIAL ULTRASOUND;  Surgeon: Collene Gobble, MD;  Location: [facility] OR;  Service: Thoracic;  Laterality: N/A;  . WISDOM TOOTH EXTRACTION    :   Current Facility-Administered Medications:  .  acetaminophen (TYLENOL) tablet 650 mg, 650 mg, Oral, Q6H PRN **OR** acetaminophen (TYLENOL) suppository 650 mg, 650 mg, Rectal, Q6H PRN, Danford, Christopher P, MD .  albuterol (PROVENTIL) (2.5 MG/3ML) 0.083% nebulizer solution 2.5 mg, 2.5 mg, Nebulization, Q2H PRN, Danford, Christopher P, MD .  albuterol (PROVENTIL) (2.5 MG/3ML) 0.083% nebulizer solution 2.5 mg, 2.5 mg, Nebulization, TID, Danford, Christopher P, MD .  amLODipine (NORVASC) tablet 10 mg, 10 mg, Oral, Daily, Danford, Suann Larry, MD, 10 mg at 02/05/2018 2013 .  aztreonam (AZACTAM) 2 g in sodium chloride 0.9 % 100 mL IVPB, 2 g, Intravenous, Q8H, Danford, Suann Larry, MD, Stopped at 02/25/18 0548 .  clonazePAM (KLONOPIN) tablet 0.5 mg, 0.5 mg, Oral, BID PRN, Danford, Suann Larry, MD .  diphenhydrAMINE (  BENADRYL) capsule 25 mg, 25 mg, Oral, Q4H PRN, Danford, Suann Larry, MD, 25 mg at 02/12/2018 1747 .  enoxaparin (LOVENOX) injection 40 mg, 40 mg, Subcutaneous, Q24H, Danford, Suann Larry, MD, 40 mg at 01/29/2018 2245 .  fluticasone (FLONASE) 50 MCG/ACT nasal spray 2 spray, 2 spray, Each Nare, Daily PRN, Danford, Suann Larry, MD .  guaiFENesin (MUCINEX) 12 hr tablet 600 mg, 600 mg, Oral, BID, Danford, Suann Larry, MD, 600 mg at 01/31/2018 2245 .  losartan (COZAAR) tablet 100 mg, 100 mg, Oral, Daily **AND** hydrochlorothiazide (HYDRODIURIL) tablet 25 mg, 25 mg, Oral, Daily, Danford, Christopher  P, MD .  HYDROcodone-acetaminophen (NORCO/VICODIN) 5-325 MG per tablet 1 tablet, 1 tablet, Oral, Q6H PRN, Danford, Suann Larry, MD, 1 tablet at 02/08/2018 1746 .  methylPREDNISolone sodium succinate (SOLU-MEDROL) 40 mg/mL injection 40 mg, 40 mg, Intravenous, Daily, Danford, Christopher P, MD .  metoprolol succinate (TOPROL-XL) 24 hr tablet 150 mg, 150 mg, Oral, Daily, Danford, Suann Larry, MD, 150 mg at 02/28/2018 2012 .  ondansetron (ZOFRAN) tablet 4 mg, 4 mg, Oral, Q6H PRN **OR** ondansetron (ZOFRAN) injection 4 mg, 4 mg, Intravenous, Q6H PRN, Danford, Christopher P, MD .  pantoprazole (PROTONIX) EC tablet 40 mg, 40 mg, Oral, BID, Danford, Suann Larry, MD, 40 mg at 01/30/2018 2245 .  umeclidinium-vilanterol (ANORO ELLIPTA) 62.5-25 MCG/INH 1 puff, 1 puff, Inhalation, Daily, Danford, Suann Larry, MD .  vancomycin (VANCOCIN) 1,250 mg in sodium chloride 0.9 % 250 mL IVPB, 1,250 mg, Intravenous, Q24H, [place], Baltazar Najjar, RPH:  . albuterol  2.5 mg Nebulization TID  . amLODipine  10 mg Oral Daily  . enoxaparin (LOVENOX) injection  40 mg Subcutaneous Q24H  . guaiFENesin  600 mg Oral BID  . losartan  100 mg Oral Daily   And  . hydrochlorothiazide  25 mg Oral Daily  . methylPREDNISolone (SOLU-MEDROL) injection  40 mg Intravenous Daily  . metoprolol succinate  150 mg Oral Daily  . pantoprazole  40 mg Oral BID  . umeclidinium-vilanterol  1 puff Inhalation Daily  :  Allergies  Allergen Reactions  . Ciprofloxacin Rash and Other (See Comments)    Rash possibly caused by Cipro?  . Flagyl [Metronidazole] Rash and Other (See Comments)    Rash possibly caused by Flagyl?  Marland Kitchen Zosyn [Piperacillin Sod-Tazobactam So] Itching, Rash and Other (See Comments)    Has patient had a PCN reaction causing immediate rash, facial/tongue/throat swelling, SOB or lightheadedness with hypotension: No Has patient had a PCN reaction causing severe rash involving mucus membranes or skin necrosis: No Has patient had a PCN  reaction that required hospitalization: No Has patient had a PCN reaction occurring within the last 10 years: Yes If all of the above answers are "NO", then may proceed with Cephalosporin use.   :  Family History  Problem Relation Age of Onset  . Allergies Unknown   . COPD Cousin   . Breast cancer Mother   . Diabetes Father   . Diabetes Son   :  Social History   Socioeconomic History  . Marital status: Widowed    Spouse name: Not on file  . Number of children: Not on file  . Years of education: Not on file  . Highest education level: Not on file  Occupational History  . Occupation: retired  Scientific laboratory technician  . Financial resource strain: Not on file  . Food insecurity:    Worry: Not on file    Inability: Not on file  . Transportation needs:  Medical: Not on file    Non-medical: Not on file  Tobacco Use  . Smoking status: Former Smoker    Packs/day: 0.50    Years: 50.00    Pack years: 25.00    Types: Cigarettes    Last attempt to quit: 11/01/2016    Years since quitting: 1.3  . Smokeless tobacco: Never Used  Substance and Sexual Activity  . Alcohol use: No  . Drug use: No  . Sexual activity: Not on file  Lifestyle  . Physical activity:    Days per week: Not on file    Minutes per session: Not on file  . Stress: Not on file  Relationships  . Social connections:    Talks on phone: Not on file    Gets together: Not on file    Attends religious service: Not on file    Active member of club or organization: Not on file    Attends meetings of clubs or organizations: Not on file    Relationship status: Not on file  . Intimate partner violence:    Fear of current or ex partner: Not on file    Emotionally abused: Not on file    Physically abused: Not on file    Forced sexual activity: Not on file  Other Topics Concern  . Not on file  Social History Narrative   [facility] (12/10/16):   Patient's widower son and grandchildren moved in with her. Her husband  passed years ago. Previously worked in Press [employer].  :  Pertinent items are noted in HPI.  Exam: Patient Vitals for the past 24 hrs:  BP Temp Temp src Pulse Resp SpO2 Height Weight  02/25/18 0618 133/67 98.3 F (36.8 C) Oral 92 20 97 % - -  02/08/2018 2151 125/64 97.7 F (36.5 C) Oral (!) 108 20 96 % - -  02/28/2018 2055 - - - - - 90 % - -  02/09/2018 1915 (!) 120/58 98.2 F (36.8 C) Oral (!) 118 (!) 24 95 % - -  02/18/2018 1712 - 98.4 F (36.9 C) Oral - - - - -  02/20/2018 1712 (!) 130/57 - - (!) 125 (!) 21 96 % - -  02/25/2018 1437 - (!) 102.5 F (39.2 C) Rectal - - - 5\' 7"  (1.702 m) 230 lb (104.3 kg)  02/01/2018 1434 (!) 171/71 - - (!) 139 20 99 % - -  02/22/2018 1433 - - - - - 97 % - -     Recent Labs    02/22/2018 1454 02/25/18 0515  WBC 16.9* 16.6*  HGB 10.9*  12.2 10.7*  HCT 35.4*  36.0 35.9*  PLT 490* 453*   Recent Labs    02/09/2018 1454 02/25/18 0515  NA 136  133* 136  K 4.1  4.1 5.0  CL 89*  87* 91*  CO2 34* 31  GLUCOSE 129*  127* 105*  BUN 17  14 31*  CREATININE 0.91  1.00 1.77*  CALCIUM 9.2 8.7*    Blood smear review: None  Pathology: None    Assessment and Plan: Alisha Fisher is a 72 year old white female.  She has metastatic non-small cell lung cancer.  She is on immunotherapy.  She has had her first treatment this last week.  Like to see how she responds to the antibiotics.  Again, she has really bad underlying COPD.  I am sure that steroids will be needed to try to help this.  This may counteract the immunotherapy.  We will just  have to watch out for this.  Again, while she is here, we should consider a Port-A-Cath for her.  She would be agreeable to this.  We will follow along and try to help out in any way possible.  I know that she will get outstanding care from everybody up on 4 [address].  Alisha Haw, MD  Alisha Fisher 6:12

## 2018-02-25 NOTE — Evaluation (Signed)
Physical Therapy Evaluation Patient Details Name: Alisha Fisher MRN: [id number] DOB: 1946-08-01 Today's Date: 02/25/2018   History of Present Illness  72 y.o. female with a past medical history significant for Squamous cell carcinoma Lung locally advanced, COPD home O2 3L, wheelchair bound, morbid obesity, OSA not on CPAP, dCHF, chronic IDA, and HTN who presents with 4-5 days malaise, progressive cough and now dyspnea and admitted for acute on chronic respiratory failure with hypoxia and hypercapnia  Clinical Impression  Pt admitted with above diagnosis. Pt currently with functional limitations due to the deficits listed below (see PT Problem List).  Pt initially did not wish to get OOB however encouraged to transfer to [facility] at least (bed linen saturated).  Pt reported feeling better using BSC.  Pt reports she typically uses 3L O2 at baseline for physical activities however currently on 4L O2 and SPO2 84% with transfers.  Pt reports her son (whom she lives with) will be returning to work and she is uncertain how she will function at home.  Pt will benefit from skilled PT to increase their independence and safety with mobility to allow discharge to the venue listed below.  Recommend SNF upon d/c.     Follow Up Recommendations SNF    Equipment Recommendations  None recommended by PT    Recommendations for Other Services       Precautions / Restrictions Precautions Precautions: Fall Precaution Comments: monitor sats      Mobility  Bed Mobility Overal bed mobility: Needs Assistance Bed Mobility: Supine to Sit;Sit to Supine     Supine to sit: Min guard;HOB elevated Sit to supine: Min assist   General bed mobility comments: provided a hand for pt to self assist trunk upright, slight assist for LEs onto bed  Transfers Overall transfer level: Needs assistance Equipment used: Rolling walker (2 wheeled) Transfers: Sit to/from [facility] Sit to Stand: Min assist Stand  pivot transfers: Min assist       General transfer comment: verbal cues for hand placement, assist to steady, pt agreeable to OOB to [facility], utilized armrests for steadying however pt reports using RW at home, SPO2 dropped to 84% on 4L O2 with transfers, NT assisted with pericare  Ambulation/Gait                Stairs            Wheelchair Mobility    Modified Rankin (Stroke Patients Only)       Balance Overall balance assessment: Needs assistance         Standing balance support: Bilateral upper extremity supported Standing balance-Leahy Scale: Poor                               Pertinent Vitals/Pain Pain Assessment: No/denies pain    Home Living Family/patient expects to be discharged to:: Private residence Living Arrangements: Children(son) Available Help at Discharge: Available PRN/intermittently(son works) Type of Home: House Home Access: Level entry     Home Layout: One level Home Equipment: Environmental consultant - 2 wheels      Prior Function Level of Independence: Independent with assistive device(s)         Comments: pt reports using RW prior to admission, chronic 2L O2 at rest, increases to 3L with exertion     Hand Dominance        Extremity/Trunk Assessment        Lower Extremity Assessment Lower Extremity Assessment: Generalized  weakness       Communication   Communication: No difficulties  Cognition Arousal/Alertness: Awake/alert Behavior During Therapy: WFL for tasks assessed/performed Overall Cognitive Status: Within Functional Limits for tasks assessed                                 General Comments: slow to respond at times      General Comments      Exercises     Assessment/Plan    PT Assessment Patient needs continued PT services  PT Problem List Decreased strength;Decreased mobility;Cardiopulmonary status limiting activity;Decreased activity tolerance;Decreased balance;Decreased knowledge of  use of DME       PT Treatment Interventions DME instruction;Therapeutic activities;Gait training;Therapeutic exercise;Patient/family education;Functional mobility training;Balance training    PT Goals (Current goals can be found in the Care Plan section)  Acute Rehab PT Goals PT Goal Formulation: With patient Time For Goal Achievement: 03/11/18 Potential to Achieve Goals: Good    Frequency Min 3X/week   Barriers to discharge        Co-evaluation               AM-PAC PT "6 Clicks" Daily Activity  Outcome Measure Difficulty turning over in bed (including adjusting bedclothes, sheets and blankets)?: A Little Difficulty moving from lying on back to sitting on the side of the bed? : Unable Difficulty sitting down on and standing up from a chair with arms (e.g., wheelchair, bedside commode, etc,.)?: Unable Help needed moving to and from a bed to chair (including a wheelchair)?: A Little Help needed walking in hospital room?: A Lot Help needed climbing 3-5 steps with a railing? : Total 6 Click Score: 11    End of Session Equipment Utilized During Treatment: Gait belt;Oxygen Activity Tolerance: Patient limited by fatigue Patient left: in bed;with bed alarm set;with call bell/phone within reach;with nursing/sitter in room Nurse Communication: Mobility status PT Visit Diagnosis: Difficulty in walking, not elsewhere classified (R26.2);Muscle weakness (generalized) (M62.81)    Time: 7893-8101 PT Time Calculation (min) (ACUTE ONLY): 29 min   Charges:   PT Evaluation $PT Eval Low Complexity: 1 Low     PT G Codes:       [name], PT, DPT 02/25/2018 Pager: [phone]   York Ram E 02/25/2018, 1:49 PM

## 2018-02-26 ENCOUNTER — Inpatient Hospital Stay (HOSPITAL_COMMUNITY): Payer: Medicare Other

## 2018-02-26 ENCOUNTER — Ambulatory Visit: Payer: Medicare Other | Admitting: Emergency Medicine

## 2018-02-26 DIAGNOSIS — I480 Paroxysmal atrial fibrillation: Secondary | ICD-10-CM

## 2018-02-26 DIAGNOSIS — M79606 Pain in leg, unspecified: Secondary | ICD-10-CM

## 2018-02-26 DIAGNOSIS — M7989 Other specified soft tissue disorders: Secondary | ICD-10-CM

## 2018-02-26 DIAGNOSIS — I712 Thoracic aortic aneurysm, without rupture: Secondary | ICD-10-CM

## 2018-02-26 LAB — CBC WITH DIFFERENTIAL/PLATELET
Basophils Absolute: 0 10*3/uL (ref 0.0–0.1)
Basophils Relative: 0 %
Eosinophils Absolute: 0 10*3/uL (ref 0.0–0.7)
Eosinophils Relative: 0 %
HCT: 32.2 % — ABNORMAL LOW (ref 36.0–46.0)
HEMOGLOBIN: 9.5 g/dL — AB (ref 12.0–15.0)
LYMPHS ABS: 0.5 10*3/uL — AB (ref 0.7–4.0)
LYMPHS PCT: 3 %
MCH: 28.7 pg (ref 26.0–34.0)
MCHC: 29.5 g/dL — AB (ref 30.0–36.0)
MCV: 97.3 fL (ref 78.0–100.0)
MONOS PCT: 4 %
Monocytes Absolute: 0.6 10*3/uL (ref 0.1–1.0)
NEUTROS PCT: 93 %
Neutro Abs: 15.3 10*3/uL — ABNORMAL HIGH (ref 1.7–7.7)
Platelets: 416 10*3/uL — ABNORMAL HIGH (ref 150–400)
RBC: 3.31 MIL/uL — AB (ref 3.87–5.11)
RDW: 15.3 % (ref 11.5–15.5)
WBC: 16.4 10*3/uL — AB (ref 4.0–10.5)

## 2018-02-26 LAB — BASIC METABOLIC PANEL
Anion gap: 8 (ref 5–15)
BUN: 37 mg/dL — ABNORMAL HIGH (ref 6–20)
CHLORIDE: 94 mmol/L — AB (ref 101–111)
CO2: 32 mmol/L (ref 22–32)
Calcium: 8.4 mg/dL — ABNORMAL LOW (ref 8.9–10.3)
Creatinine, Ser: 1.15 mg/dL — ABNORMAL HIGH (ref 0.44–1.00)
GFR calc Af Amer: 55 mL/min — ABNORMAL LOW (ref >60)
GFR, EST NON AFRICAN AMERICAN: 47 mL/min — AB (ref >60)
Glucose, Bld: 93 mg/dL (ref 65–99)
POTASSIUM: 4.4 mmol/L (ref 3.5–5.1)
Sodium: 134 mmol/L — ABNORMAL LOW (ref 135–145)

## 2018-02-26 LAB — PROTIME-INR
INR: 1.16
Prothrombin Time: 14.7 seconds (ref 11.4–15.2)

## 2018-02-26 LAB — URINALYSIS, ROUTINE W REFLEX MICROSCOPIC
BILIRUBIN URINE: NEGATIVE
GLUCOSE, UA: NEGATIVE mg/dL
HGB URINE DIPSTICK: NEGATIVE
Ketones, ur: NEGATIVE mg/dL
Leukocytes, UA: NEGATIVE
Nitrite: NEGATIVE
Protein, ur: NEGATIVE mg/dL
SPECIFIC GRAVITY, URINE: 1.018 (ref 1.005–1.030)
pH: 5 (ref 5.0–8.0)

## 2018-02-26 LAB — EXPECTORATED SPUTUM ASSESSMENT W GRAM STAIN, RFLX TO RESP C

## 2018-02-26 LAB — STREP PNEUMONIAE URINARY ANTIGEN: STREP PNEUMO URINARY ANTIGEN: NEGATIVE

## 2018-02-26 LAB — EXPECTORATED SPUTUM ASSESSMENT W REFEX TO RESP CULTURE

## 2018-02-26 LAB — PROCALCITONIN: PROCALCITONIN: 0.34 ng/mL

## 2018-02-26 MED ORDER — LEVALBUTEROL HCL 0.63 MG/3ML IN NEBU
0.6300 mg | INHALATION_SOLUTION | Freq: Three times a day (TID) | RESPIRATORY_TRACT | Status: DC
  Administered 2018-02-26 – 2018-03-02 (×14): 0.63 mg via RESPIRATORY_TRACT
  Filled 2018-02-26 (×14): qty 3

## 2018-02-26 MED ORDER — SODIUM CHLORIDE 0.9 % IV SOLN
510.0000 mg | Freq: Once | INTRAVENOUS | Status: AC
  Administered 2018-02-26: 510 mg via INTRAVENOUS
  Filled 2018-02-26: qty 17

## 2018-02-26 MED ORDER — FUROSEMIDE 10 MG/ML IJ SOLN
40.0000 mg | Freq: Once | INTRAMUSCULAR | Status: AC
  Administered 2018-02-26: 40 mg via INTRAVENOUS
  Filled 2018-02-26: qty 4

## 2018-02-26 MED ORDER — SODIUM CHLORIDE 3 % IN NEBU
4.0000 mL | INHALATION_SOLUTION | Freq: Two times a day (BID) | RESPIRATORY_TRACT | Status: AC
  Administered 2018-02-26 – 2018-02-28 (×4): 4 mL via RESPIRATORY_TRACT
  Filled 2018-02-26 (×6): qty 4

## 2018-02-26 MED ORDER — LEVALBUTEROL HCL 0.63 MG/3ML IN NEBU
0.6300 mg | INHALATION_SOLUTION | RESPIRATORY_TRACT | Status: DC | PRN
  Administered 2018-02-26 – 2018-03-15 (×6): 0.63 mg via RESPIRATORY_TRACT
  Filled 2018-02-26 (×7): qty 3

## 2018-02-26 MED ORDER — DM-GUAIFENESIN ER 30-600 MG PO TB12
1.0000 | ORAL_TABLET | Freq: Two times a day (BID) | ORAL | Status: DC
  Administered 2018-02-26 – 2018-03-14 (×29): 1 via ORAL
  Filled 2018-02-26 (×32): qty 1

## 2018-02-26 NOTE — NC FL2 (Signed)
[place] LEVEL OF CARE SCREENING TOOL     IDENTIFICATION  Patient Name: Alisha Fisher Birthdate: Aug 21, 1946 Sex: female Admission Date (Current Location): 02/26/2018  [facility] and [place] Number:  Herbalist and Address:  [facility],  [place], [place]      Provider Number: [id number]  Attending Physician Name and Address:  Alma Friendly, MD  Relative Name and Phone Number:       Current Level of Care: Hospital Recommended Level of Care: [place] Prior Approval Number:    Date Approved/Denied:   PASRR Number: [id number]  Discharge Plan: SNF    Current Diagnoses: Patient Active Problem List   Diagnosis Date Noted  . AKI (acute kidney injury) ([employer]) 02/25/2018  . COPD with acute exacerbation ([place]) 02/12/2018  . Pleural effusion 02/04/2018  . Community acquired pneumonia 02/12/2018  . Community acquired pneumonia of right lower lobe of lung ([place]) 02/20/2018  . Goals of care, counseling/discussion 02/12/2018  . Hilar mass   . Anxiety disorder 12/05/2017  . Iron deficiency anemia 11/07/2017  . OSA (obstructive sleep apnea) 10/17/2017  . Radiation pneumonitis ([place]) 10/17/2017  . Constipation   . Chronic diastolic heart failure ([place]) 12/30/2016  . Squamous cell lung cancer, right ([place]) 12/25/2016  . Rash and nonspecific skin eruption   . Malignant neoplasm of bronchus of left upper lobe ([place]) 12/20/2016  . Chronic respiratory failure with hypoxia ([place]) 12/18/2016  . Essential hypertension 12/18/2016  . Anemia 12/18/2016  . Hypokalemia 12/16/2016  . Hypomagnesemia 12/16/2016  . Intraperitoneal abscess s/p perc drainage 12/14/2016 12/15/2016  . Acute appendicitis with perforation and peritoneal abscess 12/03/2016  . Obesity 05/25/2012  . Smoker 05/25/2012  . COPD (chronic obstructive pulmonary disease) ([place]) 05/25/2012    Orientation RESPIRATION BLADDER Height & Weight     Self, Time,  Situation, Place  O2, Other (Comment)(CPAP Adult Large Full Face Mask) Incontinent Weight: 230 lb (104.3 kg) Height:  5\' 7"  (170.2 cm)  BEHAVIORAL SYMPTOMS/MOOD NEUROLOGICAL BOWEL NUTRITION STATUS      Continent Diet(see dc summary)  AMBULATORY STATUS COMMUNICATION OF NEEDS Skin   Extensive Assist Verbally Normal                       Personal Care Assistance Level of Assistance  Bathing, Feeding, Dressing Bathing Assistance: Maximum assistance Feeding assistance: Independent Dressing Assistance: Maximum assistance     Functional Limitations Info  Sight, Hearing, Speech Sight Info: Adequate Hearing Info: Adequate Speech Info: Adequate    SPECIAL CARE FACTORS FREQUENCY  PT (By licensed PT), OT (By licensed OT)     PT Frequency: 5x/week OT Frequency: 5x/week            Contractures Contractures Info: Not present    Additional Factors Info  Code Status, Allergies Code Status Info: Full Code Allergies Info: Ciprofloxacin;Flagyl Metronidazole;Zosyn Piperacillin Sod-tazobactam So           Current Medications (02/26/2018):  This is the current hospital active medication list Current Facility-Administered Medications  Medication Dose Route Frequency Provider Last Rate Last Dose  . 0.9 %  sodium chloride infusion   Intravenous Continuous Shelly Coss, MD 75 mL/hr at 02/26/18 0919    . acetaminophen (TYLENOL) tablet 650 mg  650 mg Oral Q6H PRN Danford, Suann Larry, MD       Or  . acetaminophen (TYLENOL) suppository 650 mg  650 mg Rectal Q6H PRN Danford, Suann Larry, MD      .  amLODipine (NORVASC) tablet 10 mg  10 mg Oral Daily Danford, Suann Larry, MD   10 mg at 02/26/18 0920  . azithromycin (ZITHROMAX) 500 mg in sodium chloride 0.9 % 250 mL IVPB  500 mg Intravenous Q24H Shelly Coss, MD   Stopped at 02/26/18 1257  . cefTRIAXone (ROCEPHIN) 1 g in sodium chloride 0.9 % 100 mL IVPB  1 g Intravenous Q24H Adhikari, Amrit, MD 200 mL/hr at 02/26/18 1157 1 g at  02/26/18 1157  . clonazePAM (KLONOPIN) tablet 0.5 mg  0.5 mg Oral BID PRN Edwin Dada, MD   0.5 mg at 02/26/18 0636  . dextromethorphan-guaiFENesin (MUCINEX DM) 30-600 MG per 12 hr tablet 1 tablet  1 tablet Oral BID Alma Friendly, MD   1 tablet at 02/26/18 1157  . diphenhydrAMINE (BENADRYL) capsule 25 mg  25 mg Oral Q4H PRN Edwin Dada, MD   25 mg at 02/02/2018 1747  . fluticasone (FLONASE) 50 MCG/ACT nasal spray 2 spray  2 spray Each Nare Daily PRN Danford, Suann Larry, MD      . heparin injection 5,000 Units  5,000 Units Subcutaneous Q8H Shelly Coss, MD   5,000 Units at 02/26/18 0552  . HYDROcodone-acetaminophen (NORCO/VICODIN) 5-325 MG per tablet 1 tablet  1 tablet Oral Q6H PRN Edwin Dada, MD   1 tablet at 02/12/2018 1746  . levalbuterol (XOPENEX) nebulizer solution 0.63 mg  0.63 mg Nebulization TID Shelly Coss, MD   0.63 mg at 02/26/18 1434  . levalbuterol (XOPENEX) nebulizer solution 0.63 mg  0.63 mg Nebulization Q4H PRN Shelly Coss, MD   0.63 mg at 02/26/18 1025  . methylPREDNISolone sodium succinate (SOLU-MEDROL) 40 mg/mL injection 40 mg  40 mg Intravenous Daily Edwin Dada, MD   40 mg at 02/26/18 0920  . metoprolol succinate (TOPROL-XL) 24 hr tablet 150 mg  150 mg Oral Daily Edwin Dada, MD   150 mg at 02/26/18 0920  . ondansetron (ZOFRAN) tablet 4 mg  4 mg Oral Q6H PRN Danford, Suann Larry, MD       Or  . ondansetron (ZOFRAN) injection 4 mg  4 mg Intravenous Q6H PRN Danford, Suann Larry, MD      . pantoprazole (PROTONIX) EC tablet 40 mg  40 mg Oral BID Edwin Dada, MD   40 mg at 02/26/18 0920  . sodium chloride HYPERTONIC 3 % nebulizer solution 4 mL  4 mL Nebulization BID Alma Friendly, MD   4 mL at 02/26/18 1024  . umeclidinium-vilanterol (ANORO ELLIPTA) 62.5-25 MCG/INH 1 puff  1 puff Inhalation Daily Danford, Suann Larry, MD   1 puff at 02/26/18 0800     Discharge Medications: Please see  discharge summary for a list of discharge medications.  Relevant Imaging Results:  Relevant Lab Results:   Additional Information SSN [id number]  Burnis Medin, LCSW

## 2018-02-26 NOTE — Progress Notes (Signed)
Patient was called out complaining shortness of breath and being unable to breathe. Patient was told to take a few deep breaths and head of the bed was elevated and RN with help from student nurse helped the patient to sit at the edge of the bed. Patient reported she is feeling much better. While checking o2 saturation, patient was found to be at 60% on 4l. O2 on 6l was 94%. MD was on the floor and notified. Received orders for hypertonic neb solution and IV lasix. Respiratory at bedside. After neb treatment, Patient's o2 sat on 4l at 96%. Patient reports feeling much better. RN will continue to monitor. MD made aware.

## 2018-02-26 NOTE — Progress Notes (Signed)
Notified by CCMD that pt converted to A Fib. EKG obtained showing A Fib with RVR. NP on call, Schorr, notified. 250 mL bolus and IV lopressor ordered and administered. Pt currently in sinus rhythm with a rate of 89. Will continue to monitor.
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
## 2018-02-26 NOTE — Evaluation (Signed)
Clinical/Bedside Swallow Evaluation Patient Details  Name: Alisha Fisher MRN: [id number] Date of Birth: Feb 21, 1946  Today's Date: 02/26/2018 Time: SLP Start Time (ACUTE ONLY): 54 SLP Stop Time (ACUTE ONLY): 1310 SLP Time Calculation (min) (ACUTE ONLY): 23 min  Past Medical History:  Past Medical History:  Diagnosis Date  . Cancer ([place])    lung cancer  . COPD, severe ([place])   . Goals of care, counseling/discussion 02/12/2018  . HTN (hypertension)   . Iron deficiency anemia 11/07/2017  . Panic attack   . Pneumonia   . Pulmonary emphysema ([place])   . Sleep apnea    no cpap needs more recent sleep study  . SOB (shortness of breath)    Past Surgical History:  Past Surgical History:  Procedure Laterality Date  . CATARACT EXTRACTION    . ENDOBRONCHIAL ULTRASOUND Bilateral 12/17/2016   Procedure: ENDOBRONCHIAL ULTRASOUND;  Surgeon: Javier Glazier, MD;  Location: [facility];  Service: Cardiopulmonary;  Laterality: Bilateral;  . IR GENERIC HISTORICAL  12/21/2016   IR SINUS/FIST TUBE CHK-NON GI 12/21/2016 [facility]  . IR GENERIC HISTORICAL  12/21/2016   IR US GUIDE BX ASP/DRAIN 12/21/2016 Corrie Mckusick, DO [facility]  . IR RADIOLOGIST EVAL & MGMT  01/17/2017  . VIDEO BRONCHOSCOPY WITH ENDOBRONCHIAL ULTRASOUND N/A 12/10/2017   Procedure: VIDEO BRONCHOSCOPY WITH ENDOBRONCHIAL ULTRASOUND;  Surgeon: Collene Gobble, MD;  Location: [facility] OR;  Service: Thoracic;  Laterality: N/A;  . WISDOM TOOTH EXTRACTION     HPI:  pt is a 72 yo female adm to [facility] with respiratory difficulties.  PMH + for lung cancer s/p XRT, COPD, panic attacks, dysphagia.  Pt with no effusion  - ?pna and squamous cell carcinoma of right lung.  Pt reports problems swallowing since her radiation tx -  intermittently-  She states she will choke on saliva and regurgitate non-digested food.  She also states she will choke on liquids.     Assessment / Plan / Recommendation Clinical Impression   CN exam unremarkable and pt with good  tolerance of puree/cracker - slight difficulty with liquids causing her to cough x1 of 8 boluses. Pt admits to getting strangled on drink and her own saliva since her radiation tx to her chest region.  States this occurs inconsistently but attributes this to her radiation tx.  Recommend regular/thin diet as pt very aware of her dysphagia and eats only what she can manage.  Educated pt to results/recommendations- Advised pt to only eat sitting fully upright- as she was laying down and going to consume liquids.  Will follow up to determine if further oropharyngeal evaluation needed or to provide mitigation strategies for possible esophageal deficits.   SLP Visit Diagnosis: Dysphagia, unspecified (R13.10)    Aspiration Risk  Mild aspiration risk    Diet Recommendation Regular;Thin liquid   Liquid Administration via: Cup;Straw Medication Administration: Whole meds with liquid(as tolerated) Supervision: Patient able to self feed Compensations: Small sips/bites;Slow rate Postural Changes: Remain upright for at least 30 minutes after po intake;Seated upright at 90 degrees    Other  Recommendations Recommended Consults: Consider esophageal assessment Oral Care Recommendations: Oral care BID   Follow up Recommendations        Frequency and Duration min 1 x/week  1 week       Prognosis Prognosis for Safe Diet Advancement: Fair Barriers to Reach Goals: Other (Comment)(diagnosis)      Swallow Study   General Date of Onset: 02/26/18 HPI: pt is a 72 yo female adm to  [facility] with respiratory difficulties.  PMH + for lung cancer s/p XRT, COPD, panic attacks, dysphagia.  Pt with no effusion  - ?pna and squamous cell carcinoma of right lung.  Pt reports problems swallowing since her radiation tx -  intermittently-  She states she will choke on saliva and regurgitate non-digested food.  She also states she will choke on liquids.   Type of Study: Bedside Swallow Evaluation Diet Prior to this Study:  Regular;Thin liquids Temperature Spikes Noted: No Respiratory Status: Nasal cannula History of Recent Intubation: No Behavior/Cognition: Alert;Cooperative Oral Cavity Assessment: Dry Oral Care Completed by SLP: No Oral Cavity - Dentition: Adequate natural dentition Vision: Functional for self-feeding Self-Feeding Abilities: Able to feed self Patient Positioning: Upright in bed Baseline Vocal Quality: Normal Volitional Cough: Strong Volitional Swallow: Able to elicit    Oral/Motor/Sensory Function Overall Oral Motor/Sensory Function: Within functional limits   Ice Chips Ice chips: Not tested   Thin Liquid Thin Liquid: Impaired Presentation: Cup;Self Fed;Straw Other Comments: could not do 3 ounce water test stating she was too short of breath, cough x1 of 8 boluses    Nectar Thick Nectar Thick Liquid: Not tested   Honey Thick Honey Thick Liquid: Not tested   Puree Puree: Within functional limits Presentation: Self Fed;Spoon   Solid   GO   Solid: Within functional limits Presentation: Self Fed;Spoon        Macario Golds 02/26/2018,3:11 PM Luanna Salk, [place] [facility] SLP [phone]

## 2018-02-26 NOTE — Progress Notes (Signed)
Bilateral lower extremity venous duplex has been completed. Negative for DVT.  02/26/18 11:08 AM Carlos Levering RVT

## 2018-02-26 NOTE — Evaluation (Signed)
SLP Cancellation Note  Patient Details Name: Alisha Fisher MRN: [id number] DOB: September 26, 1946   Cancelled treatment:       Reason Eval/Treat Not Completed: Other (comment);Patient at procedure or test/unavailable(pt having 2D ECHO, will continue efforts)   Macario Golds 02/26/2018, 10:57 AM

## 2018-02-26 NOTE — Progress Notes (Signed)
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  In the ED, patient was found to be hypoxic, febrile and tachycardic.  Patient admitted for further management  Today, patient still with cough, shortness of breath with possible episodes of panic attacks where patient feels like she cannot catch her breath and desaturates.  No fever/chills noted.  Denies any chest pain/pressure.  Noted to have a short run of A. fib with RVR status post IV Lopressor and converted back to sinus rhythm overnight  Assessment/Plan: Principal Problem:   Community acquired pneumonia Active Problems:   Obesity   COPD (chronic obstructive pulmonary disease) (HCC)   Essential hypertension   Squamous cell lung cancer, right (HCC)   Chronic diastolic heart failure (HCC)   OSA (obstructive sleep apnea)   Iron deficiency anemia   Anxiety disorder   COPD with acute exacerbation (HCC)   Pleural effusion   Community acquired pneumonia of right lower lobe of lung ([place])   AKI (acute kidney injury) ([place])   ??Post-obstructive pneumonia/radiation fibrosis Afebrile, with leukocytosis (on steroids) Procalcitonin trending down to 0.34 Urine strep pneumo, Legionella pending collection BC x2, NGTD Chest x-ray: Extensive peribronchial airspace consolidation versus lymphangitic spread of tumor. CT chest: Continued presence of large right lower lobe mass with pleural spread is noted consistent with history of lung cancer. 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 is well Continue IV ceftriaxone  and azithromycin  Acute on chronic hypoxic and hypercapnic respiratory failure/COPD/panic attacks Management as above Continue duo nebs, steroids, supplemental oxygen as needed, klonopin  AKI Improving S/P gentle hydration Daily BMP  Iron deficiency/anemia of chronic disease Baseline hemoglobin between 10-11 Status post IV iron on 02/26/2018 CBC  Squamous cell carcinoma of the right lung Oncology following, on Keytruda (oncology considering switching to chemo) Plan for Port-A-Cath placement  Chronic diastolic heart failure Echo showed EF of 65 to 71%, grade 2 diastolic dysfunction S/P 1 dose of IV lasix 40 mg Repeat chest x-ray in a.m, will consider restarting home dose Lasix  Hypertension Stable Continue amlodipine, metoprolol  Obstructive sleep apnea Continue CPAP  GERD Continue Protonix  Deconditioning/GOC discussion Palliative consult    Code Status: Full  Family Communication: None at bedside  Disposition Plan: Once significant improvement overall  Consultants:  Oncology  Procedures:  None  Antimicrobials:  Ceftriaxone  Azithromycin  DVT prophylaxis: Heparin   Objective: Vitals:   02/26/18 0455 02/26/18 0800 02/26/18 0915 02/26/18 1026  BP: 121/70  (!) 152/99   Pulse: 94  91 88  Resp: 20  20 18   Temp: 97.8 F (36.6 C)  98 F (36.7 C)   TempSrc: Oral  Oral   SpO2: 90% 93% (!) 87% 97%  Weight:      Height:        Intake/Output Summary (Last 24 hours) at 02/26/2018 1619 Last data filed at 02/26/2018 1157 Gross per 24 hour  Intake 3026.25 ml  Output -  Net 3026.25 ml   Filed Weights   02/12/2018 1437  Weight: 104.3 kg (230 lb)    Exam:   General: Anxious, mild distress  Cardiovascular: S1, S2 present  Respiratory: Bilateral expiratory wheezing, rhonchi  Abdomen: Soft, obese, nontender, nondistended, bowel sounds present  Musculoskeletal: No pedal edema bilaterally  Skin: Normal  Psychiatry: Anxious   Data  Reviewed: CBC: Recent Labs  Lab 02/18/2018 1454 02/25/18 0515 02/26/18 0526  WBC 16.9* 16.6* 16.4*  NEUTROABS 15.3*  --  15.3*  HGB 10.9*  12.2 10.7* 9.5*  HCT 35.4*  36.0 35.9* 32.2*  MCV 94.4 97.8 97.3  PLT 490* 453* 229*   Basic Metabolic Panel: Recent Labs  Lab 02/11/2018 1454 02/25/18 0515 02/26/18 0526  NA 136  133* 136 134*  K 4.1  4.1 5.0 4.4  CL 89*  87* 91* 94*  CO2 34* 31 32  GLUCOSE 129*  127* 105* 93  BUN 17  14 31* 37*  CREATININE 0.91  1.00 1.77* 1.15*  CALCIUM 9.2 8.7* 8.4*   GFR: Estimated Creatinine Clearance: 56.6 mL/min (A) (by C-G formula based on SCr of 1.15 mg/dL (H)). Liver Function Tests: Recent Labs  Lab 02/03/2018 1454 02/25/18 0515  AST 18 14*  ALT 24 22  ALKPHOS 95 84  BILITOT 0.6 0.8  PROT 7.8 7.1  ALBUMIN 3.0* 2.6*   No results for input(s): LIPASE, AMYLASE in the last 168 hours. No results for input(s): AMMONIA in the last 168 hours. Coagulation Profile: Recent Labs  Lab 02/26/18 0526  INR 1.16   Cardiac Enzymes: No results for input(s): CKTOTAL, CKMB, CKMBINDEX, TROPONINI in the last 168 hours. BNP (last 3 results) No results for input(s): PROBNP in the last 8760 hours. HbA1C: No results for input(s): HGBA1C in the last 72 hours. CBG: No results for input(s): GLUCAP in the last 168 hours. Lipid Profile: No results for input(s): CHOL, HDL, LDLCALC, TRIG, CHOLHDL, LDLDIRECT in the last 72 hours. Thyroid Function Tests: No results for input(s): TSH, T4TOTAL, FREET4, T3FREE, THYROIDAB in the last 72 hours. Anemia Panel: No results for input(s): VITAMINB12, FOLATE, FERRITIN, TIBC, IRON, RETICCTPCT in the last 72 hours. Urine analysis:    Component Value Date/Time   COLORURINE YELLOW 12/04/2016 0425   APPEARANCEUR HAZY (A) 12/04/2016 0425   LABSPEC >1.046 (H) 12/04/2016 0425   PHURINE 5.0 12/04/2016 0425   GLUCOSEU NEGATIVE 12/04/2016 0425   HGBUR NEGATIVE 12/04/2016 0425   BILIRUBINUR NEGATIVE 12/04/2016 0425    KETONESUR NEGATIVE 12/04/2016 0425   PROTEINUR NEGATIVE 12/04/2016 0425   NITRITE NEGATIVE 12/04/2016 0425   LEUKOCYTESUR NEGATIVE 12/04/2016 0425   Sepsis Labs: @LABRCNTIP (procalcitonin:4,lacticidven:4)  ) Recent Results (from the past 240 hour(s))  Blood Culture (routine x 2)     Status: None (Preliminary result)   Collection Time: 02/08/2018  2:47 PM  Result Value Ref Range Status   Specimen Description   Final    BLOOD RIGHT ANTECUBITAL Performed at [facility], [place] [address] [place], [place] [address]    Special Requests   Final    BOTTLES DRAWN AEROBIC AND ANAEROBIC Blood Culture adequate volume Performed at [place] [address], [place], [place] [address]    Culture   Final    NO GROWTH 2 DAYS Performed at [place] [address] [place], [place] [address]    Report Status PENDING  Incomplete  Blood Culture (routine x 2)     Status: None (Preliminary result)   Collection Time: 02/28/2018  2:48 PM  Result Value Ref Range Status   Specimen Description   Final    BLOOD LEFT FOREARM Performed at [place] [facility]  [address] [place], [place] [address]    Special Requests   Final    BOTTLES DRAWN AEROBIC AND ANAEROBIC Blood Culture adequate volume Performed at [place] [address], [place], [place] [address]    Culture   Final    NO GROWTH 2 DAYS Performed at [place] [address], [place], [place] [address]    Report Status PENDING  Incomplete  MRSA PCR Screening     Status: None   Collection Time: 02/27/2018  4:50 PM  Result Value Ref Range Status   MRSA by PCR NEGATIVE NEGATIVE Final    Comment:        The GeneXpert MRSA Assay (FDA approved for NASAL specimens only), is one component of a comprehensive MRSA colonization surveillance program. It is not intended to diagnose MRSA infection nor to guide or monitor treatment for MRSA  infections. Performed at [facility], [place] [address], [place], [place] [address]   Culture, sputum-assessment     Status: None   Collection Time: 02/26/18  4:40 AM  Result Value Ref Range Status   Specimen Description SPUTUM  Final   Special Requests NONE  Final   Sputum evaluation   Final    Sputum specimen not acceptable for testing.  Please recollect.   [place] 5[address] 02/26/18 A NAVARRO Performed at [place] [address], [place], [place] [address]    Report Status 02/26/2018 FINAL  Final      Studies: Dg Esophagus  Result Date: 02/26/2018 CLINICAL DATA:  Dysphagia.  Metastatic lung cancer. EXAM: ESOPHOGRAM/BARIUM SWALLOW TECHNIQUE: Single contrast examination was performed using  thin barium. FLUOROSCOPY TIME:  Fluoroscopy Time:  42 seconds Radiation Exposure Index (if provided by the fluoroscopic device): 26.1 mGy COMPARISON:  CT scan of the chest dated 02/09/2018 FINDINGS: There is a slight mass effect upon the right side of the esophagus just below the carina consistent with the adenopathy seen on chest x-ray. However, there is no stricture at that site. The patient has poor esophageal motility with a poor secondary stripping wave with to and fro peristalsis in the mid esophagus with intermittent tertiary contractions in the mid esophagus. There is a small hiatal hernia without a stricture. Note is made of a moderate right effusion as well as bibasilar lung consolidation, right greater than left. IMPRESSION: 1. Poor esophageal motility with to and fro peristalsis in the mid esophagus with occasional tertiary contractions in the mid esophagus. 2. Slight mass effect on the mid esophagus due to subcarinal adenopathy. However, this does not cause significant narrowing of the esophagus. 3. Small hiatal hernia without a stricture. Electronically Signed   By: Lorriane Shire M.D.   On: 02/26/2018 16:02    Scheduled Meds: . amLODipine  10  mg Oral Daily  . dextromethorphan-guaiFENesin  1 tablet Oral BID  . heparin injection (subcutaneous)  5,000 Units Subcutaneous Q8H  . levalbuterol  0.63 mg Nebulization TID  . methylPREDNISolone (SOLU-MEDROL) injection  40 mg Intravenous Daily  . metoprolol succinate  150 mg Oral Daily  . pantoprazole  40 mg Oral BID  . sodium chloride HYPERTONIC  4 mL Nebulization BID  . umeclidinium-vilanterol  1 puff Inhalation Daily    Continuous Infusions: . sodium chloride 75 mL/hr at 02/26/18 0919  . azithromycin Stopped (02/26/18 1257)  . cefTRIAXone (ROCEPHIN)  IV 1 g (02/26/18 1157)     LOS: 2 days     Alma [facility], MD [facility]  If 7PM-7AM, please contact night-coverage [email or web address] Password [facility] 02/26/2018, 4:19 PM

## 2018-02-26 NOTE — Progress Notes (Signed)
CPAP placed on pt. due to >'d WOB by staff, pt. wears 4 lpm n/c under mask, RT to monitor.

## 2018-02-26 NOTE — Progress Notes (Signed)
Ms. Hakala panic attack this morning.  She has been having them recently is having a.  According to her nurse, she had an episode of atrial fibrillation last night.  She was given some Lopressor for this.  She is on a monitor.  It does not look like she has had problems with paroxysmal atrial fibrillation.  She complained of some leg pain.  I think that it would be a good idea to check for blood clots.  She has had significant risk for blood clots.  Her iron studies were low a week or so ago.  I will go ahead and give her a dose of IV iron.  Her breathing might be a little bit better.  She is on steroids.  In thinking about things, we might need to think about actual chemotherapy for her.  If she is going to need to be on steroids because of her underlying COPD, immunotherapy might be difficult to administer.  We hope we will be able to get the Port-A-Cath into her soon.  I probably would wait until Friday to try to get this in.  On her physical exam, her lungs do sound a little bit better.  She is getting nebulizers which I think will help.  She is quite upset that her TV does not work.  The maintenance man is here this morning.  Hopefully, this will help settle her down a little bit and will minimize these panic attacks.  I know the staff up on [address] are doing a tremendous job in taking care of her.  Lattie Haw, MD  Darlyn Chamber 17:14

## 2018-02-26 NOTE — Plan of Care (Addendum)
Urine sample obtained and sent to lab for processing.  Lab confirmed - sputum sample collected and earlier this AM

## 2018-02-26 NOTE — Clinical Social Work Note (Signed)
Clinical Social Work Assessment  Patient Details  Name: Alisha Fisher MRN: [id number] Date of Birth: 07-03-1946  Date of referral:  02/26/18               Reason for consult:  Facility Placement                Permission sought to share information with:  Facility Sport and exercise psychologist, Family Supports Permission granted to share information::  Yes, Verbal Permission Granted  Name::     Alisha Fisher  Agency::     Relationship::  Sister  Contact Information:  [phone]  Housing/Transportation Living arrangements for the past 2 months:  Single Family Home Source of Information:  Patient, Siblings Patient Interpreter Needed:  None Criminal Activity/Legal Involvement Pertinent to Current Situation/Hospitalization:  No - Comment as needed Significant Relationships:  Siblings Lives with:  Adult Children Do you feel safe going back to the place where you live?  (PT recommending SNF) Need for family participation in patient care:  Yes (Comment)  Care giving concerns:  Patient from home with son. Per chart review, patient uses a wheelchair. PT recommending SNF.   Social Worker assessment / plan:  CSW spoke with patient/patient's sister regarding PT recommendation for SNF. Patient reported that she wanted to go to [facility] and that she prefers a bigger room. Patient reported that she was at [facility] around this time last year and that she was claustrophobic in her last room. Patient's sister reported that patient needs to be in a cooler room because of her breathing issues. Patient's sister reported that patient gets chemotherapy at Dr. Antonieta Pert office once every 3 weeks and that patient will need transportation to chemotherapy appointments. CSW agreed to follow up with [facility] to see if they are able to offer patient a bed and meet patient's needs.  CSW will complete patient's FL2 and follow up with [facility].  CSW will continue to follow and assist with discharge  planning.  Employment status:  Retired Forensic scientist:  Medicare PT Recommendations:  [place] / Referral to community resources:  [place]  Patient/Family's Response to care:  Patient/patient's sister appreciative of CSW assistance with discharge planning.  Patient/Family's Understanding of and Emotional Response to Diagnosis, Current Treatment, and Prognosis:  Patient presented appropriate and verbalized understanding of current treatment plan. Patient verbalized plan to dc to SNF for ST rehab prior to returning home.   Emotional Assessment Appearance:  Appears stated age Attitude/Demeanor/Rapport:  Other(Cooperative) Affect (typically observed):  Appropriate Orientation:  Oriented to Self, Oriented to Situation, Oriented to Place, Oriented to  Time Alcohol / Substance use:  Not Applicable Psych involvement (Current and /or in the community):  No (Comment)  Discharge Needs  Concerns to be addressed:  Care Coordination Readmission within the last 30 days:  No Current discharge risk:  Physical Impairment Barriers to Discharge:  Continued Medical Work up   [employer], LCSW 02/26/2018, 2:43 PM

## 2018-02-27 ENCOUNTER — Encounter (HOSPITAL_COMMUNITY): Payer: Self-pay | Admitting: Radiology

## 2018-02-27 ENCOUNTER — Inpatient Hospital Stay (HOSPITAL_COMMUNITY): Payer: Medicare Other

## 2018-02-27 DIAGNOSIS — Z515 Encounter for palliative care: Secondary | ICD-10-CM

## 2018-02-27 DIAGNOSIS — C7951 Secondary malignant neoplasm of bone: Secondary | ICD-10-CM

## 2018-02-27 DIAGNOSIS — N179 Acute kidney failure, unspecified: Secondary | ICD-10-CM

## 2018-02-27 DIAGNOSIS — I5032 Chronic diastolic (congestive) heart failure: Secondary | ICD-10-CM

## 2018-02-27 DIAGNOSIS — C3490 Malignant neoplasm of unspecified part of unspecified bronchus or lung: Secondary | ICD-10-CM

## 2018-02-27 DIAGNOSIS — F419 Anxiety disorder, unspecified: Secondary | ICD-10-CM

## 2018-02-27 DIAGNOSIS — J181 Lobar pneumonia, unspecified organism: Secondary | ICD-10-CM

## 2018-02-27 LAB — CBC WITH DIFFERENTIAL/PLATELET
BASOS PCT: 0 %
Basophils Absolute: 0 10*3/uL (ref 0.0–0.1)
EOS PCT: 0 %
Eosinophils Absolute: 0 10*3/uL (ref 0.0–0.7)
HEMATOCRIT: 33.8 % — AB (ref 36.0–46.0)
HEMOGLOBIN: 10 g/dL — AB (ref 12.0–15.0)
LYMPHS ABS: 0.6 10*3/uL — AB (ref 0.7–4.0)
Lymphocytes Relative: 4 %
MCH: 28.6 pg (ref 26.0–34.0)
MCHC: 29.6 g/dL — AB (ref 30.0–36.0)
MCV: 96.6 fL (ref 78.0–100.0)
MONOS PCT: 4 %
Monocytes Absolute: 0.6 10*3/uL (ref 0.1–1.0)
Neutro Abs: 13.7 10*3/uL — ABNORMAL HIGH (ref 1.7–7.7)
Neutrophils Relative %: 92 %
Platelets: 411 10*3/uL — ABNORMAL HIGH (ref 150–400)
RBC: 3.5 MIL/uL — ABNORMAL LOW (ref 3.87–5.11)
RDW: 15.1 % (ref 11.5–15.5)
WBC: 14.9 10*3/uL — AB (ref 4.0–10.5)

## 2018-02-27 LAB — BLOOD GAS, ARTERIAL
Acid-Base Excess: 7.4 mmol/L — ABNORMAL HIGH (ref 0.0–2.0)
Bicarbonate: 37 mmol/L — ABNORMAL HIGH (ref 20.0–28.0)
DRAWN BY: 441261
O2 Content: 4 L/min
O2 Saturation: 92.8 %
Patient temperature: 37
pCO2 arterial: 88.7 mmHg (ref 32.0–48.0)
pH, Arterial: 7.244 — ABNORMAL LOW (ref 7.350–7.450)
pO2, Arterial: 73.2 mmHg — ABNORMAL LOW (ref 83.0–108.0)

## 2018-02-27 LAB — BASIC METABOLIC PANEL
ANION GAP: 11 (ref 5–15)
BUN: 29 mg/dL — ABNORMAL HIGH (ref 6–20)
CHLORIDE: 92 mmol/L — AB (ref 101–111)
CO2: 33 mmol/L — ABNORMAL HIGH (ref 22–32)
Calcium: 8.9 mg/dL (ref 8.9–10.3)
Creatinine, Ser: 0.85 mg/dL (ref 0.44–1.00)
GFR calc non Af Amer: >60 mL/min (ref >60)
Glucose, Bld: 96 mg/dL (ref 65–99)
Potassium: 4.5 mmol/L (ref 3.5–5.1)
Sodium: 136 mmol/L (ref 135–145)

## 2018-02-27 LAB — LEGIONELLA PNEUMOPHILA SEROGP 1 UR AG: L. pneumophila Serogp 1 Ur Ag: NEGATIVE

## 2018-02-27 LAB — GLUCOSE, CAPILLARY: GLUCOSE-CAPILLARY: 139 mg/dL — AB (ref 65–99)

## 2018-02-27 LAB — D-DIMER, QUANTITATIVE (NOT AT ARMC): D DIMER QUANT: 2.83 ug{FEU}/mL — AB (ref 0.00–0.50)

## 2018-02-27 MED ORDER — METHYLPREDNISOLONE SODIUM SUCC 40 MG IJ SOLR
40.0000 mg | Freq: Two times a day (BID) | INTRAMUSCULAR | Status: DC
  Administered 2018-02-27 – 2018-03-02 (×7): 40 mg via INTRAVENOUS
  Filled 2018-02-27 (×8): qty 1

## 2018-02-27 MED ORDER — LORAZEPAM 2 MG/ML IJ SOLN
1.0000 mg | Freq: Once | INTRAMUSCULAR | Status: AC
  Administered 2018-02-27: 1 mg via INTRAVENOUS
  Filled 2018-02-27: qty 1

## 2018-02-27 MED ORDER — IOPAMIDOL (ISOVUE-370) INJECTION 76%
100.0000 mL | Freq: Once | INTRAVENOUS | Status: AC | PRN
  Administered 2018-02-27: 100 mL via INTRAVENOUS

## 2018-02-27 MED ORDER — ESMOLOL HCL-SODIUM CHLORIDE 2000 MG/100ML IV SOLN
25.0000 ug/kg/min | INTRAVENOUS | Status: DC
  Administered 2018-02-27: 25 ug/kg/min via INTRAVENOUS
  Administered 2018-02-28: 150 ug/kg/min via INTRAVENOUS
  Administered 2018-02-28: 125 ug/kg/min via INTRAVENOUS
  Administered 2018-02-28: 175 ug/kg/min via INTRAVENOUS
  Administered 2018-02-28: 150 ug/kg/min via INTRAVENOUS
  Administered 2018-02-28 (×2): 175 ug/kg/min via INTRAVENOUS
  Administered 2018-02-28: 225 ug/kg/min via INTRAVENOUS
  Administered 2018-02-28: 175 ug/kg/min via INTRAVENOUS
  Administered 2018-02-28: 225 ug/kg/min via INTRAVENOUS
  Administered 2018-02-28: 175 ug/kg/min via INTRAVENOUS
  Administered 2018-02-28: 150 ug/kg/min via INTRAVENOUS
  Administered 2018-03-01: 200 ug/kg/min via INTRAVENOUS
  Administered 2018-03-01 (×2): 250 ug/kg/min via INTRAVENOUS
  Administered 2018-03-01: 175 ug/kg/min via INTRAVENOUS
  Administered 2018-03-01: 275 ug/kg/min via INTRAVENOUS
  Administered 2018-03-01: 175 ug/kg/min via INTRAVENOUS
  Administered 2018-03-01: 275 ug/kg/min via INTRAVENOUS
  Administered 2018-03-01: 225 ug/kg/min via INTRAVENOUS
  Administered 2018-03-01: 150 ug/kg/min via INTRAVENOUS
  Administered 2018-03-01: 275 ug/kg/min via INTRAVENOUS
  Administered 2018-03-01: 175 ug/kg/min via INTRAVENOUS
  Administered 2018-03-01: 250 ug/kg/min via INTRAVENOUS
  Administered 2018-03-01: 225 ug/kg/min via INTRAVENOUS
  Administered 2018-03-01: 250 ug/kg/min via INTRAVENOUS
  Administered 2018-03-02: 300 ug/kg/min via INTRAVENOUS
  Administered 2018-03-02: 25 ug/kg/min via INTRAVENOUS
  Administered 2018-03-02: 175 ug/kg/min via INTRAVENOUS
  Administered 2018-03-02: 250 ug/kg/min via INTRAVENOUS
  Administered 2018-03-02 (×4): 300 ug/kg/min via INTRAVENOUS
  Administered 2018-03-03: 125 ug/kg/min via INTRAVENOUS
  Administered 2018-03-03: 50 ug/kg/min via INTRAVENOUS
  Administered 2018-03-03: 25 ug/kg/min via INTRAVENOUS
  Administered 2018-03-03 – 2018-03-04 (×3): 125 ug/kg/min via INTRAVENOUS
  Administered 2018-03-04: 25 ug/kg/min via INTRAVENOUS
  Administered 2018-03-04: 50 ug/kg/min via INTRAVENOUS
  Filled 2018-02-27 (×42): qty 100

## 2018-02-27 MED ORDER — IOPAMIDOL (ISOVUE-370) INJECTION 76%
INTRAVENOUS | Status: AC
  Filled 2018-02-27: qty 100

## 2018-02-27 MED ORDER — BUDESONIDE 0.5 MG/2ML IN SUSP
0.5000 mg | Freq: Two times a day (BID) | RESPIRATORY_TRACT | Status: DC
Start: 2018-02-27 — End: 2018-03-08
  Administered 2018-02-27 – 2018-03-08 (×18): 0.5 mg via RESPIRATORY_TRACT
  Filled 2018-02-27 (×18): qty 2

## 2018-02-27 MED ORDER — FUROSEMIDE 10 MG/ML IJ SOLN
40.0000 mg | Freq: Four times a day (QID) | INTRAMUSCULAR | Status: AC
  Administered 2018-02-27 (×2): 40 mg via INTRAVENOUS
  Filled 2018-02-27 (×2): qty 4

## 2018-02-27 MED ORDER — LORAZEPAM 2 MG/ML IJ SOLN
0.5000 mg | Freq: Once | INTRAMUSCULAR | Status: AC
  Administered 2018-02-27: 0.5 mg via INTRAVENOUS
  Filled 2018-02-27: qty 1

## 2018-02-27 MED ORDER — ARFORMOTEROL TARTRATE 15 MCG/2ML IN NEBU
15.0000 ug | INHALATION_SOLUTION | Freq: Two times a day (BID) | RESPIRATORY_TRACT | Status: DC
  Administered 2018-02-27 – 2018-03-02 (×6): 15 ug via RESPIRATORY_TRACT
  Filled 2018-02-27 (×6): qty 2

## 2018-02-27 MED ORDER — FUROSEMIDE 10 MG/ML IJ SOLN
INTRAMUSCULAR | Status: AC
Start: 2018-02-27 — End: 2018-02-27
  Filled 2018-02-27: qty 4

## 2018-02-27 MED ORDER — FUROSEMIDE 10 MG/ML IJ SOLN
40.0000 mg | Freq: Every day | INTRAMUSCULAR | Status: DC
  Administered 2018-02-27: 40 mg via INTRAVENOUS

## 2018-02-27 MED ORDER — SODIUM CHLORIDE 0.9 % IV SOLN
2.0000 g | INTRAVENOUS | Status: AC
  Administered 2018-02-28 – 2018-03-03 (×4): 2 g via INTRAVENOUS
  Filled 2018-02-27 (×4): qty 2

## 2018-02-27 NOTE — Care Management Important Message (Signed)
Important Message  Patient Details  Name: Alisha Fisher MRN: [id number] Date of Birth: 1946/08/03   Medicare Important Message Given:  Yes    Kerin Salen 02/27/2018, 10:57 AMImportant Message  Patient Details  Name: Alisha Fisher MRN: [id number] Date of Birth: [birth year]   Medicare Important Message Given:  Yes    Kerin Salen 02/27/2018, 10:57 AM

## 2018-02-27 NOTE — Consult Note (Signed)
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  She had a bronchoscopy in March 2019 which confirmed suspicion of recurrent squamous cell carcinoma and then she started treatment with immunotherapy (Pembrolizumab) in May 2019.  She has received 1 treatment.  She presented to [facility] on Feb 24, 2018 complaining of 4 to 5 days of chest congestion and lethargy and shortness of breath.  A CT scan performed after admission showed persistent right upper lobe radiation fibrosis, and what appears to be significant growth of the right lower lobe mass with pleural involvement versus consolidation and pneumonia.  She was admitted by the hospitalist service and has been receiving treatment for a COPD exacerbation, healthcare associated pneumonia, and has received some diuretic therapy.  Today she had to be transferred to the intensive care unit because of increasing work of breathing and lethargy.  She was noted to be hypercarbic.  She was started on noninvasive mechanical ventilation after arrival.  Shortly after starting on that treatment her anxiety dramatically worsened and so she was given Ativan.  Pulmonary and critical care medicine was consulted because of the patient's worsening respiratory status and a continued FULL CODE STATUS.  Since arrival to the intensive care unit she has become less anxious and is more compliant with NIMV.  Her sister provides most of the history because the patient was confused from hypercarbia when I was present.  She tells me that she has not been able to care for herself fully for at least a  year.  Specifically for the last year she has spent most of the time in a recliner and has not actually slept in her bedroom.  She will get up and walk to the bathroom which is approximately 5 feet from the recliner a few times per day, though she requires a walker frequently for this.  She has difficulty showering and changing close.  She does not cook for herself, clean, or leave the house.  PAST MEDICAL HISTORY :  She  has a past medical history of Cancer ([place]), COPD, severe ([place]), Goals of care, counseling/discussion (02/12/2018), HTN (hypertension), Iron deficiency anemia (11/07/2017), Panic attack, Pneumonia, Pulmonary emphysema ([place]), Sleep apnea, and SOB (shortness of breath).  PAST SURGICAL HISTORY: She  has a past surgical history that includes Cataract extraction; Endobronchial ultrasound (Bilateral, 12/17/2016); ir generic historical (12/21/2016); ir generic historical (12/21/2016); IR Radiologist Eval & Mgmt (01/17/2017); Wisdom tooth extraction; and Video bronchoscopy with endobronchial ultrasound (N/A, 12/10/2017).  Allergies  Allergen Reactions  . Ciprofloxacin Rash and Other (See Comments)    Rash possibly caused by Cipro?  . Flagyl [Metronidazole] Rash and Other (See Comments)    Rash possibly caused by Flagyl?  Marland Kitchen Zosyn [Piperacillin Sod-Tazobactam So] Itching, Rash and Other (See Comments)    Has patient had a PCN reaction causing immediate rash, facial/tongue/throat swelling, SOB or lightheadedness with hypotension: No Has patient had a PCN reaction causing severe rash involving mucus membranes or skin necrosis: No Has patient had a PCN reaction that required hospitalization: No Has patient had a PCN reaction occurring within the last 10 years: Yes  If all of the above answers are "NO", then may proceed with Cephalosporin use.     No current facility-administered medications on file prior to encounter.    Current Outpatient Medications on File Prior to Encounter  Medication Sig   . ADVAIR DISKUS 250-50 MCG/DOSE AEPB Inhale 1 puff into the lungs 2 (two) times daily.  Marland Kitchen albuterol (PROVENTIL HFA;VENTOLIN HFA) 108 (90 Base) MCG/ACT inhaler Inhale 2 puffs into the lungs every 6 (six) hours as needed for wheezing or shortness of breath.  Marland Kitchen amLODipine (NORVASC) 10 MG tablet Take 1 tablet (10 mg total) daily by mouth.  Jearl Klinefelter ELLIPTA 62.5-25 MCG/INH AEPB Inhale 1 puff into the lungs daily.   . clonazePAM (KLONOPIN) 0.5 MG tablet Take 1 tablet (0.5 mg total) by mouth 2 (two) times daily as needed for anxiety.  . diphenhydrAMINE (BENADRYL) 25 mg capsule Take 25 mg by mouth every 4 (four) hours as needed for itching or allergies.  Marland Kitchen docusate sodium (COLACE) 100 MG capsule Take 100 mg by mouth 2 (two) times daily as needed for mild constipation.  . fluticasone (FLONASE) 50 MCG/ACT nasal spray Place 2 sprays into both nostrils daily as needed for allergies.  Marland Kitchen losartan-hydrochlorothiazide (HYZAAR) 100-25 MG tablet Take 1 tablet daily by mouth.  . metoprolol succinate (TOPROL-XL) 100 MG 24 hr tablet Take 2 tablets (200 mg total) by mouth daily. (Patient taking differently: Take 150 mg by mouth daily. Take 1.5 tablets (150 mg) daily)  . HYDROcodone-homatropine (HYCODAN) 5-1.5 MG/5ML syrup Take 5 mLs by mouth every 6 (six) hours as needed for cough.  . loratadine (CLARITIN) 10 MG tablet Take 1 tablet (10 mg total) by mouth daily as needed for allergies.  Marland Kitchen ondansetron (ZOFRAN) 8 MG tablet Take 1 tablet (8 mg total) by mouth 2 (two) times daily as needed (Nausea or vomiting).  . pantoprazole (PROTONIX) 40 MG tablet Take 1 tablet (40 mg total) by mouth 2 (two) times daily.  . prochlorperazine (COMPAZINE) 10 MG tablet Take 1 tablet (10 mg total) by mouth every 6 (six) hours as needed (Nausea or vomiting).    FAMILY HISTORY:  Her indicated that her mother is deceased. She indicated that her father is deceased. She indicated that the status of her son is unknown. She indicated that the  status of her cousin is unknown. She indicated that the status of her unknown relative is unknown.   SOCIAL HISTORY: She  reports that she quit smoking about 15 months ago. Her smoking use included cigarettes. She has a 25.00 pack-year smoking history. She has never used smokeless tobacco. She reports that she does not drink alcohol or use drugs.  REVIEW OF SYSTEMS:   Cannot obtain due to wearing NIMV  SUBJECTIVE:  As above  VITAL SIGNS: BP (!) 154/62   Pulse 95   Temp 97.6 F (36.4 C) (Axillary)   Resp 17   Ht 5\' 7"  (1.702 m)   Wt 230 lb (104.3 kg)   SpO2 97%   BMI 36.02 kg/m   HEMODYNAMICS:    VENTILATOR SETTINGS: FiO2 (%):  [50 %] 50 %  INTAKE / OUTPUT: I/O last 3 completed shifts: In: 3325 [I.V.:1975; IV Piggyback:1350] Out: 450 [Urine:450]  PHYSICAL EXAMINATION:  General:  Obese, resting in bed on NIMV HENT: NCAT facial mask in place PULM: diminished R base, wheezing bilaterally, crackles LLL, vent supported breathing CV: Tachycardic, no mgr GI: BS+, soft, nontender MSK: normal bulk and tone Neuro: drowsy but will wake to voice, follows commands  LABS:  BMET Recent Labs  Lab 02/25/18 0515 02/26/18 0526 02/27/18 0527  NA 136 134* 136  K 5.0 4.4 4.5  CL 91* 94* 92*  CO2 31 32 33*  BUN 31* 37* 29*  CREATININE 1.77* 1.15* 0.85  GLUCOSE 105* 93 96    Electrolytes Recent Labs  Lab 02/25/18 0515 02/26/18 0526 02/27/18 0527  CALCIUM 8.7* 8.4* 8.9    CBC Recent Labs  Lab 02/25/18 0515 02/26/18 0526 02/27/18 0527  WBC 16.6* 16.4* 14.9*  HGB 10.7* 9.5* 10.0*  HCT 35.9* 32.2* 33.8*  PLT 453* 416* 411*    Coag's Recent Labs  Lab 02/26/18 0526  INR 1.16    Sepsis Markers Recent Labs  Lab 01/29/2018 1454 02/20/2018 1925 02/25/18 0515 02/26/18 0526  LATICACIDVEN 1.37  --   --   --   PROCALCITON  --  0.53 0.61 0.34    ABG Recent Labs  Lab 02/27/18 1100  PHART 7.244*  PCO2ART 88.7*  PO2ART 73.2*    Liver Enzymes Recent  Labs  Lab 02/14/2018 1454 02/25/18 0515  AST 18 14*  ALT 24 22  ALKPHOS 95 84  BILITOT 0.6 0.8  ALBUMIN 3.0* 2.6*    Cardiac Enzymes No results for input(s): TROPONINI, PROBNP in the last 168 hours.  Glucose Recent Labs  Lab 02/27/18 1106  GLUCAP 139*    Imaging Dg Esophagus  Result Date: 02/26/2018 CLINICAL DATA:  Dysphagia.  Metastatic lung cancer. EXAM: ESOPHOGRAM/BARIUM SWALLOW TECHNIQUE: Single contrast examination was performed using  thin barium. FLUOROSCOPY TIME:  Fluoroscopy Time:  42 seconds Radiation Exposure Index (if provided by the fluoroscopic device): 26.1 mGy COMPARISON:  CT scan of the chest dated 02/23/2018 FINDINGS: There is a slight mass effect upon the right side of the esophagus just below the carina consistent with the adenopathy seen on chest x-ray. However, there is no stricture at that site. The patient has poor esophageal motility with a poor secondary stripping wave with to and fro peristalsis in the mid esophagus with intermittent tertiary contractions in the mid esophagus. There is a small hiatal hernia without a stricture. Note is made of a moderate right effusion as well as bibasilar lung consolidation, right greater than left. IMPRESSION: 1. Poor esophageal motility with to and fro peristalsis in the mid esophagus with occasional tertiary contractions in the mid esophagus. 2. Slight mass effect on the mid esophagus due to subcarinal adenopathy. However, this does not cause significant narrowing of the esophagus. 3. Small hiatal hernia without a stricture. Electronically Signed   By: Lorriane Shire M.D.   On: 02/26/2018 16:02   Dg Chest Port 1 View  Result Date: 02/27/2018 CLINICAL DATA:  72 year old female with large right lower lung mass, lung cancer. Shortness of breath. EXAM: PORTABLE CHEST 1 VIEW COMPARISON:  Chest CT and radiographs 01/30/2018. FINDINGS: Portable AP semi upright view at 0433 hours. Continued dense right lower lung and retrocardiac  opacity corresponding to the lung mass/drowned lung. Mildly increased pulmonary vascularity but no overt edema. Increased left lung base opacity most resembling atelectasis. No pneumothorax. No large pleural effusion. Stable cardiac size and mediastinal contours. Visualized tracheal air column is within normal limits. IMPRESSION: 1. Increased left lung base atelectasis and pulmonary vascular congestion since 02/09/2018. 2. Continued dense right lower lung opacification due to lung mass and drowned lung. Electronically Signed   By: Genevie Ann M.D.   On: 02/27/2018 08:31     STUDIES:  Feb 24, 2018 CT chest images independently reviewed showing  significant radiation fibrosis of the right upper lobe, significant worsening of consolidation versus fluid collection in the right lower lobe compared to the February 2019 CT scan which was performed.  There is also centrilobular emphysema noted in the left lung.  CULTURES: Feb 24, 2018 blood culture Feb 24, 2018 respiratory culture  ANTIBIOTICS: May 27 aztreonam x1 May 27 vancomycin x1 May 28 azithromycin May 28 ceftriaxone  SIGNIFICANT EVENTS:   LINES/TUBES:   DISCUSSION: 72 year old female with acute on chronic respiratory failure with hypercarbia in the setting of severe COPD, recurrent squamous cell carcinoma, right upper lobe radiation fibrosis, and worsening airspace disease and mass-effect from right lower lobe lung mass.  She may have some degree of pleural effusion in the right lung but it is not evident from the CT scan.  Her case is complicated by severe deconditioning and recent lung cancer treatment.  I am being consulted for assistance in management with hypercarbic respiratory failure.  I believe there are multiple causes for her acute respiratory failure with hypercarbia: Acute pulmonary edema, acute exacerbation of COPD, morbid obesity, severe deconditioning, significant tumor burden in the right lower lobe.  She may have healthcare  associated pneumonia though I am not convinced of that.  If her condition continues to worsen and if she were to require invasive mechanical ventilation or CPR in the event of a cardiac arrest the likelihood of survival is near 0.  Her multiple advanced chronic conditions and severity of the current acute conditions all predict a poor outcome.  See discussion below.  ASSESSMENT / PLAN:  PULMONARY A: Acute on chronic respiratory failure with hypercarbia Acute pulmonary edema Significant tumor burden in right lung causing dyspnea Possible right lung pleural effusion, not clear from CT imaging Severe COPD Morbid obesity with chronic hypercarbia, likely obesity hypoventilation syndrome P:   Continue noninvasive mechanical ventilation Increase Solu-Medrol to 40 mg twice a day Add long-acting beta agonist in the form of Brovana Add inhaled corticosteroid: Pulmicort nebulized twice a day Continue Xopenex 3 times a day as ordered and as needed Decrease FiO2 to target an O2 saturation of 88 to 94% to minimize iatrogenic hypercarbia Lasix today: 40 mg every 6x2 doses   Code status: full  FAMILY  - Updates: I had a lengthy conversation with the patient's sister today regarding the likelihood of surviving this acute severe illness that requires life support.  I explained to her that I am very concerned that if her sister's condition were to worsen the likelihood of an intervention with invasive mechanical ventilation or chest compressions and electric shocks in the event of a cardiac arrest would provide no medical benefit.  The patient's sister says that "she wants to live" and they are hopeful that the immunotherapy will help her regain some quality of life.  Given the fact that this lady has been unable to care for herself for over a year in the setting of 3 severe lung diseases (recurrent squamous cell carcinoma, advanced emphysema, and significant radiation fibrosis) complicated by severe  deconditioning and obesity hypoventilation syndrome the likelihood of survival here is near 0.  The patient's sister is making decisions but she is not the healthcare attorney in fact.  The patient did not designate anyone to have the healthcare power of attorney.  I advised the patient's sister that medical decision making needs to be performed with the patient's son present.  She told me that she will reach out to them.  I have advised that they continue to  discuss her CODE STATUS.  Specifically, I advised that they allow [place] to care for her as aggressively as possible but stop short of mechanical ventilation or CPR because I believe those interventions would not be medically beneficial.   Palliative care notes reviewed  My cc time 60 minutes  Roselie Awkward, MD [facility] Pager: [phone] Cell: [phone] After 3pm or if no response, call [phone]  02/27/2018, 1:05 PM

## 2018-02-27 NOTE — Progress Notes (Signed)
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  In the ED, patient was found to be hypoxic, febrile and tachycardic.  Patient admitted for further management  Today, patient noted to have more increased WOB, lethargic, overall worsening respiratory symptoms. Pt transferred to SDU/ICU, PCCM consulted.  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[place])   Acute on chronic hypoxic and hypercapnic respiratory failure/COPD/obesity hypoventilation syndrome/Acute pulmonary edema Lethargic, increased WOB, currently on bipap ABG with worsening hypercarbia PCCM consulted, appreciate recs Continue bipap, duonebs/inhaler, steroids, lasix Continue to monitor in SDU  ??Post-obstructive pneumonia/radiation fibrosis Afebrile, with leukocytosis (on steroids) Procalcitonin trending down to 0.34 Urine strep pneumo, Legionella both negative BC x2, NGTD Chest x-ray: Extensive peribronchial airspace consolidation versus lymphangitic spread of tumor, repeat with now worsening  vascular congestion CT chest: Continued presence of large right lower lobe mass with pleural spread is noted consistent with history of lung cancer. 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 is well Continue IV ceftriaxone and azithromycin  AKI Resolved Daily BMP  Iron deficiency/anemia of chronic disease Baseline hemoglobin between 10-11 Status post IV iron on 02/26/2018 Daily CBC  Squamous cell carcinoma of the right lung Oncology following, on Keytruda (oncology considering switching to chemo) Plan for Port-A-Cath placement  Acute on chronic diastolic heart failure Echo showed EF of 65 to 31%, grade 2 diastolic dysfunction Continue IV lasix 40 mg  Hypertension Stable Continue amlodipine, metoprolol  Obstructive sleep apnea On bipap  GERD Continue Protonix  Deconditioning/GOC discussion Palliative consulted, spoke with sister, still wants full code    Code Status: Full  Family Communication: None at bedside  Disposition Plan: Once significant improvement  Consultants:  Oncology  PCCM  Procedures:  None  Antimicrobials:  Ceftriaxone  Azithromycin  DVT prophylaxis: Heparin   Objective: Vitals:   02/27/18 1500 02/27/18 1600 02/27/18 1700 02/27/18 1800  BP:  (!) 175/73  (!) 121/53  Pulse: 78 85 77 75  Resp: (!) 21 (!) 22 (!) 22 17  Temp:  97.8 F (36.6 C)    TempSrc:  Axillary    SpO2: 95% 92% 93% 98%  Weight:      Height:        Intake/Output Summary (Last 24 hours) at 02/27/2018 1822 Last data filed at 02/27/2018 1735 Gross per 24 hour  Intake -  Output 2130 ml  Net -2130 ml   Filed Weights   01/29/2018 1437  Weight: 104.3 kg (230 lb)    Exam:   General: Anxious, lethargic, moderate distress  Cardiovascular: S1, S2 present  Respiratory: Bilateral expiratory wheezing, rhonchi  Abdomen:  Soft, obese, nontender, nondistended, bowel sounds present  Musculoskeletal: No pedal edema bilaterally  Skin:  Normal  Psychiatry: Anxious   Data Reviewed: CBC: Recent Labs  Lab 02/10/2018 1454 02/25/18 0515 02/26/18 0526 02/27/18 0527  WBC 16.9* 16.6* 16.4* 14.9*  NEUTROABS 15.3*  --  15.3* 13.7*  HGB 10.9*  12.2 10.7* 9.5* 10.0*  HCT 35.4*  36.0 35.9* 32.2* 33.8*  MCV 94.4 97.8 97.3 96.6  PLT 490* 453* 416* 782*   Basic Metabolic Panel: Recent Labs  Lab 02/09/2018 1454 02/25/18 0515 02/26/18 0526 02/27/18 0527  NA 136  133* 136 134* 136  K 4.1  4.1 5.0 4.4 4.5  CL 89*  87* 91* 94* 92*  CO2 34* 31 32 33*  GLUCOSE 129*  127* 105* 93 96  BUN 17  14 31* 37* 29*  CREATININE 0.91  1.00 1.77* 1.15* 0.85  CALCIUM 9.2 8.7* 8.4* 8.9   GFR: Estimated Creatinine Clearance: 76.5 mL/min (by C-G formula based on SCr of 0.85 mg/dL). Liver Function Tests: Recent Labs  Lab 02/18/2018 1454 02/25/18 0515  AST 18 14*  ALT 24 22  ALKPHOS 95 84  BILITOT 0.6 0.8  PROT 7.8 7.1  ALBUMIN 3.0* 2.6*   No results for input(s): LIPASE, AMYLASE in the last 168 hours. No results for input(s): AMMONIA in the last 168 hours. Coagulation Profile: Recent Labs  Lab 02/26/18 0526  INR 1.16   Cardiac Enzymes: No results for input(s): CKTOTAL, CKMB, CKMBINDEX, TROPONINI in the last 168 hours. BNP (last 3 results) No results for input(s): PROBNP in the last 8760 hours. HbA1C: No results for input(s): HGBA1C in the last 72 hours. CBG: Recent Labs  Lab 02/27/18 1106  GLUCAP 139*   Lipid Profile: No results for input(s): CHOL, HDL, LDLCALC, TRIG, CHOLHDL, LDLDIRECT in the last 72 hours. Thyroid Function Tests: No results for input(s): TSH, T4TOTAL, FREET4, T3FREE, THYROIDAB in the last 72 hours. Anemia Panel: No results for input(s): VITAMINB12, FOLATE, FERRITIN, TIBC, IRON, RETICCTPCT in the last 72 hours. Urine analysis:    Component Value Date/Time   COLORURINE YELLOW 02/26/2018 1627   APPEARANCEUR CLEAR 02/26/2018 1627   LABSPEC 1.018 02/26/2018 1627   PHURINE 5.0 02/26/2018 1627    GLUCOSEU NEGATIVE 02/26/2018 1627   HGBUR NEGATIVE 02/26/2018 1627   BILIRUBINUR NEGATIVE 02/26/2018 1627   KETONESUR NEGATIVE 02/26/2018 1627   PROTEINUR NEGATIVE 02/26/2018 1627   NITRITE NEGATIVE 02/26/2018 1627   LEUKOCYTESUR NEGATIVE 02/26/2018 1627   Sepsis Labs: @LABRCNTIP (procalcitonin:4,lacticidven:4)  ) Recent Results (from the past 240 hour(s))  Blood Culture (routine x 2)     Status: None (Preliminary result)   Collection Time: 02/16/2018  2:47 PM  Result Value Ref Range Status   Specimen Description   Final    BLOOD RIGHT ANTECUBITAL Performed at [facility], [place] [address] [place], [place] [address]    Special Requests   Final    BOTTLES DRAWN AEROBIC AND ANAEROBIC Blood Culture adequate volume Performed at [facility] [address] [place], [place] [address]    Culture   Final    NO GROWTH 3 DAYS Performed at [facility], [place] [address] [place], [place] [address]    Report Status PENDING  Incomplete  Blood Culture (routine x 2)     Status: None (Preliminary result)   Collection Time: 02/15/2018  2:48 PM  Result Value Ref Range Status   Specimen Description   Final    BLOOD LEFT FOREARM Performed  at [facility], [place] [address], [place], [place] [address]    Special Requests   Final    BOTTLES DRAWN AEROBIC AND ANAEROBIC Blood Culture adequate volume Performed at [facility] [address], [place], [place] [address]    Culture   Final    NO GROWTH 3 DAYS Performed at [facility], [place] [address], [place], [place] [address]    Report Status PENDING  Incomplete  MRSA PCR Screening     Status: None   Collection Time: 02/18/2018  4:50 PM  Result Value Ref Range Status   MRSA by PCR NEGATIVE NEGATIVE Final    Comment:        The GeneXpert MRSA Assay (FDA approved for NASAL specimens only), is one component of a comprehensive MRSA colonization surveillance  program. It is not intended to diagnose MRSA infection nor to guide or monitor treatment for MRSA infections. Performed at [facility], [place] [address] [place], [place] [address]   Culture, sputum-assessment     Status: None   Collection Time: 02/26/18  4:40 AM  Result Value Ref Range Status   Specimen Description SPUTUM  Final   Special Requests NONE  Final   Sputum evaluation   Final    Sputum specimen not acceptable for testing.  Please recollect.   [place] 268[address] 02/26/18 A NAVARRO Performed at [facility] [address] [place], [place] [address]    Report Status 02/26/2018 FINAL  Final      Studies: Dg Chest Port 1 View  Result Date: 02/27/2018 CLINICAL DATA:  72 year old female with large right lower lung mass, lung cancer. Shortness of breath. EXAM: PORTABLE CHEST 1 VIEW COMPARISON:  Chest CT and radiographs 02/01/2018. FINDINGS: Portable AP semi upright view at 0433 hours. Continued dense right lower lung and retrocardiac opacity corresponding to the lung mass/drowned lung. Mildly increased pulmonary vascularity but no overt edema. Increased left lung base opacity most resembling atelectasis. No pneumothorax. No large pleural effusion. Stable cardiac size and mediastinal contours. Visualized tracheal air column is within normal limits. IMPRESSION: 1. Increased left lung base atelectasis and pulmonary vascular congestion since 02/12/2018. 2. Continued dense right lower lung opacification due to lung mass and drowned lung. Electronically Signed   By: Genevie Ann M.D.   On: 02/27/2018 08:31    Scheduled Meds: . amLODipine  10 mg Oral Daily  . arformoterol  15 mcg Nebulization BID  . budesonide (PULMICORT) nebulizer solution  0.5 mg Nebulization BID  . dextromethorphan-guaiFENesin  1 tablet Oral BID  . furosemide  40 mg Intravenous Q6H  . levalbuterol  0.63 mg Nebulization TID  . methylPREDNISolone (SOLU-MEDROL) injection  40 mg  Intravenous Q12H  . metoprolol succinate  150 mg Oral Daily  . pantoprazole  40 mg Oral BID  . sodium chloride HYPERTONIC  4 mL Nebulization BID    Continuous Infusions: . azithromycin Stopped (02/27/18 1210)  . [START ON 02/28/2018] cefTRIAXone (ROCEPHIN)  IV       LOS: 3 days     Alma Friendly, MD [facility]  If 7PM-7AM, please contact night-coverage [email or web address] Password [facility] 02/27/2018, 6:22 PM

## 2018-02-27 NOTE — Consult Note (Signed)
Consultation Note Date: 02/27/2018   Patient Name: Alisha Fisher  DOB: [birth year]  MRN: [id number]  Age / Sex: 72 y.o., female  PCP: [name], MD Referring Physician: Alma Friendly, MD  Reason for Consultation: Establishing goals of care  HPI/Patient Profile: 71 y.o. female   admitted on 02/05/2018    Clinical Assessment and Goals of Care:  72 year old lady with squamous cell lung cancer, COPD on 3 L O2, she is wheel chair bound, OSA, d CHF, iron deficiency anemia, HTN, she has been admitted with shortness of breath, hospital course complicated by panic attack, A fib with RVR, CT with large RLL mass with pleural spread. She sees Dr Marin Olp in the out patient setting, she was on Keytruda, how ever, there were plans for her to undergo port a cath placement, to consider chemotherapy and then there were plans to consider SNF at [facility].   In am on 02-27-18, the patient had to be transferred to step down unit at [name] due to increase work of breathing. A palliative consult has been placed for goals of care discussions.   The patient is on BIPAP, she is awake on BIPAP, she appears anxious, has been using accessory muscles of respiration. I did tell the patient that I would be getting in touch with her sister.   I called sister Alisha Fisher [phone], I introduced myself and palliative care as follows: Palliative medicine is specialized medical care for people living with serious illness. It focuses on providing relief from the symptoms and stress of a serious illness. The goal is to improve quality of life for both the patient and the family.  Patient's sister states that she is aware of the patient having been transferred to step down, she states that the patient is full code, she is hopeful that the patient will stabilize clinically. We frankly reviewed the patient's serious illnesses: COPD, lung  cancer, post obstructive PNA, radiation fibrosis, and her high risk for ongoing decline/decompensation.   Will add low dose IV Ativan once, and continue to monitor, full code/full scope for now.   NEXT OF KIN  only next of kin is listed as sister Alisha Fisher [phone].   SUMMARY OF RECOMMENDATIONS   Full code/full scope Low dose IV Ativan once for relief of anxiety Continue current mode of care.   Code Status/Advance Care Planning:  Full code    Symptom Management:    as above   Palliative Prophylaxis:   Bowel Regimen  Additional Recommendations (Limitations, Scope, Preferences):  Full Scope Treatment  Psycho-social/Spiritual:   Desire for further Chaplaincy support:yes  Additional Recommendations: Caregiving  Support/Resources  Prognosis:   Unable to determine  Discharge Planning: To Be Determined      Primary Diagnoses: Present on Admission: . Squamous cell lung cancer, right ([place]) . OSA (obstructive sleep apnea) . Obesity . Iron deficiency anemia . Essential hypertension . COPD (chronic obstructive pulmonary disease) ([place]) . Chronic diastolic heart failure ([place]) . Anxiety disorder . COPD  with acute exacerbation ([place]) . Pleural effusion . Community acquired pneumonia . Community acquired pneumonia of right lower lobe of lung ([place])   I have reviewed the medical record, interviewed the patient and family, and examined the patient. The following aspects are pertinent.  Past Medical History:  Diagnosis Date  . Cancer ([place])    lung cancer  . COPD, severe ([place])   . Goals of care, counseling/discussion 02/12/2018  . HTN (hypertension)   . Iron deficiency anemia 11/07/2017  . Panic attack   . Pneumonia   . Pulmonary emphysema ([place])   . Sleep apnea    no cpap needs more recent sleep study  . SOB (shortness of breath)    Social History   Socioeconomic History  . Marital status: Widowed    Spouse name: Not on file  . Number of children: Not on  file  . Years of education: Not on file  . Highest education level: Not on file  Occupational History  . Occupation: retired  Scientific laboratory technician  . Financial resource strain: Not on file  . Food insecurity:    Worry: Not on file    Inability: Not on file  . Transportation needs:    Medical: Not on file    Non-medical: Not on file  Tobacco Use  . Smoking status: Former Smoker    Packs/day: 0.50    Years: 50.00    Pack years: 25.00    Types: Cigarettes    Last attempt to quit: 11/01/2016    Years since quitting: 1.3  . Smokeless tobacco: Never Used  Substance and Sexual Activity  . Alcohol use: No  . Drug use: No  . Sexual activity: Not on file  Lifestyle  . Physical activity:    Days per week: Not on file    Minutes per session: Not on file  . Stress: Not on file  Relationships  . Social connections:    Talks on phone: Not on file    Gets together: Not on file    Attends religious service: Not on file    Active member of club or organization: Not on file    Attends meetings of clubs or organizations: Not on file    Relationship status: Not on file  Other Topics Concern  . Not on file  Social History Narrative    Pulmonary (12/10/16):   Patient's widower son and grandchildren moved in with her. Her husband passed years ago. Previously worked in Press [employer].   Family History  Problem Relation Age of Onset  . Allergies Unknown   . COPD Cousin   . Breast cancer Mother   . Diabetes Father   . Diabetes Son    Scheduled Meds: . amLODipine  10 mg Oral Daily  . dextromethorphan-guaiFENesin  1 tablet Oral BID  . furosemide  40 mg Intravenous Daily  . levalbuterol  0.63 mg Nebulization TID  . LORazepam  0.5 mg Intravenous Once  . methylPREDNISolone (SOLU-MEDROL) injection  40 mg Intravenous Daily  . metoprolol succinate  150 mg Oral Daily  . pantoprazole  40 mg Oral BID  . sodium chloride HYPERTONIC  4 mL Nebulization BID  . umeclidinium-vilanterol  1 puff Inhalation Daily     Continuous Infusions: . azithromycin 500 mg (02/27/18 1118)  . cefTRIAXone (ROCEPHIN)  IV 1 g (02/27/18 1118)   PRN Meds:.acetaminophen **OR** acetaminophen, clonazePAM, diphenhydrAMINE, fluticasone, HYDROcodone-acetaminophen, levalbuterol, ondansetron **OR** ondansetron (ZOFRAN) IV Medications Prior to Admission:  Prior to Admission medications   Medication Sig  Start Date End Date Taking? Authorizing Provider  ADVAIR DISKUS 250-50 MCG/DOSE AEPB Inhale 1 puff into the lungs 2 (two) times daily. 12/10/17  Yes Collene Gobble, MD  albuterol (PROVENTIL HFA;VENTOLIN HFA) 108 (90 Base) MCG/ACT inhaler Inhale 2 puffs into the lungs every 6 (six) hours as needed for wheezing or shortness of breath. 06/20/17  Yes Javier Glazier, MD  amLODipine (NORVASC) 10 MG tablet Take 1 tablet (10 mg total) daily by mouth. 08/06/17  Yes Belva Crome, MD  ANORO ELLIPTA 62.5-25 MCG/INH AEPB Inhale 1 puff into the lungs daily.  02/14/18  Yes [provider]  clonazePAM (KLONOPIN) 0.5 MG tablet Take 1 tablet (0.5 mg total) by mouth 2 (two) times daily as needed for anxiety. 12/05/17  Yes Collene Gobble, MD  diphenhydrAMINE (BENADRYL) 25 mg capsule Take 25 mg by mouth every 4 (four) hours as needed for itching or allergies.   Yes [provider]  docusate sodium (COLACE) 100 MG capsule Take 100 mg by mouth 2 (two) times daily as needed for mild constipation.   Yes [provider]  fluticasone (FLONASE) 50 MCG/ACT nasal spray Place 2 sprays into both nostrils daily as needed for allergies. 12/10/17  Yes Collene Gobble, MD  losartan-hydrochlorothiazide (HYZAAR) 100-25 MG tablet Take 1 tablet daily by mouth. 08/06/17  Yes Belva Crome, MD  metoprolol succinate (TOPROL-XL) 100 MG 24 hr tablet Take 2 tablets (200 mg total) by mouth daily. Patient taking differently: Take 150 mg by mouth daily. Take 1.5 tablets (150 mg) daily 12/10/17  Yes Byrum, Rose Fillers, MD  HYDROcodone-homatropine [facility])  5-1.5 MG/5ML syrup Take 5 mLs by mouth every 6 (six) hours as needed for cough. 02/19/18   Volanda Napoleon, MD  loratadine (CLARITIN) 10 MG tablet Take 1 tablet (10 mg total) by mouth daily as needed for allergies. 12/10/17   Collene Gobble, MD  ondansetron (ZOFRAN) 8 MG tablet Take 1 tablet (8 mg total) by mouth 2 (two) times daily as needed (Nausea or vomiting). 02/19/18   Volanda Napoleon, MD  pantoprazole (PROTONIX) 40 MG tablet Take 1 tablet (40 mg total) by mouth 2 (two) times daily. 02/19/18   Volanda Napoleon, MD  prochlorperazine (COMPAZINE) 10 MG tablet Take 1 tablet (10 mg total) by mouth every 6 (six) hours as needed (Nausea or vomiting). 02/19/18   Volanda Napoleon, MD   Allergies  Allergen Reactions  . Ciprofloxacin Rash and Other (See Comments)    Rash possibly caused by Cipro?  . Flagyl [Metronidazole] Rash and Other (See Comments)    Rash possibly caused by Flagyl?  Marland Kitchen Zosyn [Piperacillin Sod-Tazobactam So] Itching, Rash and Other (See Comments)    Has patient had a PCN reaction causing immediate rash, facial/tongue/throat swelling, SOB or lightheadedness with hypotension: No Has patient had a PCN reaction causing severe rash involving mucus membranes or skin necrosis: No Has patient had a PCN reaction that required hospitalization: No Has patient had a PCN reaction occurring within the last 10 years: Yes If all of the above answers are "NO", then may proceed with Cephalosporin use.    Review of Systems +anxiety +shortness of breath  Physical Exam Anxious On BIPAP Coarse scattered wheezes Obese abdomen non tender Has trace edema S1 S2  Vital Signs: BP (!) 154/62   Pulse 95   Temp 98.3 F (36.8 C) (Oral)   Resp 17   Ht 5\' 7"  (1.702 m)   Wt 104.3 kg (230 lb)  SpO2 97%   BMI 36.02 kg/m  Pain Scale: 0-10 POSS *See Group Information*: 1-Acceptable,Awake and alert Pain Score: 0-No pain   SpO2: SpO2: 97 % O2 Device:SpO2: 97 % O2 Flow Rate: .O2 Flow Rate  (L/min): 4 L/min  IO: Intake/output summary:   Intake/Output Summary (Last 24 hours) at 02/27/2018 1126 Last data filed at 02/27/2018 1100 Gross per 24 hour  Intake 1350 ml  Output 900 ml  Net 450 ml    LBM: Last BM Date: 02/25/18 Baseline Weight: Weight: 104.3 kg (230 lb) Most recent weight: Weight: 104.3 kg (230 lb)     Palliative Assessment/Data:   PPS 30%  Time In:  10.20 Time Out:  11.20 Time Total:  60 min  Greater than 50%  of this time was spent counseling and coordinating care related to the above assessment and plan.  Signed by: Loistine Chance, MD  [phone]  Please contact Palliative Medicine Team phone at [phone] for questions and concerns.  For individual provider: See Shea Evans

## 2018-02-27 NOTE — Progress Notes (Signed)
[facility] Progress Note Patient Name: Alisha Fisher DOB: 1946/07/22 MRN: [id number]   Date of Service  02/27/2018  HPI/Events of Note  CT angio chest shows 4.7 cm ascending aortic aneurysm.  Was 4.0 cm on previous CT chest.  Concern for Type A aortic dissection.  She has advanced COPD, and cancer.  She is not a suitable surgical candidate.   eICU Interventions  Will try to manage any pain symptoms and manage blood pressure.  Will need to have further d/w pt and family about goals of care.         Celisse Ciulla 02/27/2018, 10:03 PM

## 2018-02-27 NOTE — Progress Notes (Signed)
PT Cancellation Note  Patient Details Name: Alisha Fisher MRN: [id number] DOB: 26-Nov-1945   Cancelled Treatment:    Reason Eval/Treat Not Completed: Medical issues which prohibited therapy Pt with saturations at 88% on 4L O2.  Pt reports "I can't catch my breath"  MD rounding to her room next and notified. Plan to transfer to ICU/SDU.   [name],[name] 02/27/2018, 11:25 AM [name], PT, DPT 02/27/2018 Pager: [phone]

## 2018-02-27 NOTE — Progress Notes (Signed)
Entered room to answer Bipap alarm, pt. Has removed mask.  Saturation dropped immediately to 66%.  Placed back on bipap and saturations slowly climbed back to 90s.

## 2018-02-27 NOTE — Progress Notes (Signed)
[facility] Progress Note Patient Name: Elese Rane DOB: 04-03-1946 MRN: [id number]   Date of Service  02/27/2018  HPI/Events of Note  Spoke with pt's sister, [name], about CT chest findings.  Explained she likely has type A aortic dissection and usual therapy is emergent cardiothoracic surgery consult.  Explained she is not a candidate for surgery given severe COPD and lung cancer.  Explained that if dissection progresses, then there is high likelihood of fatality.     eICU Interventions  Will start esmolol gtt with goal SBP < 120, and f/u Echo.  Will update family with any changes and address goals of care further if situation gets worse.         Maleigha Colvard 02/27/2018, 10:35 PM

## 2018-02-28 ENCOUNTER — Inpatient Hospital Stay (HOSPITAL_COMMUNITY): Payer: Medicare Other

## 2018-02-28 DIAGNOSIS — C3490 Malignant neoplasm of unspecified part of unspecified bronchus or lung: Secondary | ICD-10-CM

## 2018-02-28 DIAGNOSIS — I7101 Dissection of thoracic aorta: Secondary | ICD-10-CM

## 2018-02-28 DIAGNOSIS — J441 Chronic obstructive pulmonary disease with (acute) exacerbation: Secondary | ICD-10-CM

## 2018-02-28 DIAGNOSIS — Z7189 Other specified counseling: Secondary | ICD-10-CM

## 2018-02-28 DIAGNOSIS — C3491 Malignant neoplasm of unspecified part of right bronchus or lung: Secondary | ICD-10-CM

## 2018-02-28 DIAGNOSIS — C7951 Secondary malignant neoplasm of bone: Secondary | ICD-10-CM

## 2018-02-28 DIAGNOSIS — J9622 Acute and chronic respiratory failure with hypercapnia: Secondary | ICD-10-CM

## 2018-02-28 DIAGNOSIS — R079 Chest pain, unspecified: Secondary | ICD-10-CM

## 2018-02-28 LAB — CBC WITH DIFFERENTIAL/PLATELET
Basophils Absolute: 0 10*3/uL (ref 0.0–0.1)
Basophils Relative: 0 %
EOS ABS: 0 10*3/uL (ref 0.0–0.7)
EOS PCT: 0 %
HCT: 32 % — ABNORMAL LOW (ref 36.0–46.0)
Hemoglobin: 9.5 g/dL — ABNORMAL LOW (ref 12.0–15.0)
LYMPHS ABS: 0.3 10*3/uL — AB (ref 0.7–4.0)
Lymphocytes Relative: 3 %
MCH: 28.6 pg (ref 26.0–34.0)
MCHC: 29.7 g/dL — AB (ref 30.0–36.0)
MCV: 96.4 fL (ref 78.0–100.0)
MONO ABS: 0.3 10*3/uL (ref 0.1–1.0)
MONOS PCT: 2 %
Neutro Abs: 10.5 10*3/uL — ABNORMAL HIGH (ref 1.7–7.7)
Neutrophils Relative %: 95 %
PLATELETS: 360 10*3/uL (ref 150–400)
RBC: 3.32 MIL/uL — AB (ref 3.87–5.11)
RDW: 14.8 % (ref 11.5–15.5)
WBC: 11.2 10*3/uL — ABNORMAL HIGH (ref 4.0–10.5)

## 2018-02-28 LAB — ECHOCARDIOGRAM COMPLETE
Height: 67 in
WEIGHTICAEL: 3680 [oz_av]

## 2018-02-28 LAB — URINE CULTURE: Culture: NO GROWTH

## 2018-02-28 LAB — BASIC METABOLIC PANEL
Anion gap: 9 (ref 5–15)
BUN: 29 mg/dL — AB (ref 6–20)
CO2: 39 mmol/L — ABNORMAL HIGH (ref 22–32)
CREATININE: 0.82 mg/dL (ref 0.44–1.00)
Calcium: 8.8 mg/dL — ABNORMAL LOW (ref 8.9–10.3)
Chloride: 91 mmol/L — ABNORMAL LOW (ref 101–111)
GFR calc Af Amer: >60 mL/min (ref >60)
GLUCOSE: 96 mg/dL (ref 65–99)
Potassium: 4.7 mmol/L (ref 3.5–5.1)
SODIUM: 139 mmol/L (ref 135–145)

## 2018-02-28 MED ORDER — LORAZEPAM 2 MG/ML IJ SOLN
1.0000 mg | INTRAMUSCULAR | Status: DC | PRN
  Administered 2018-02-28 – 2018-03-15 (×21): 1 mg via INTRAVENOUS
  Filled 2018-02-28 (×22): qty 1

## 2018-02-28 NOTE — Progress Notes (Signed)
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  In the ED, patient was found to be hypoxic, febrile and tachycardic.  Patient admitted for further management  Today, patient seen in ICU was on bipap, denied any worsening SOB, chest pain, abdominal pain, N/V, fever/chills.  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)   Acute on chronic respiratory failure with hypercapnia (HCC)   Acute on chronic hypoxic and hypercapnic respiratory failure/COPD/obesity hypoventilation syndrome/Acute pulmonary edema Improving currently on bipap ABG with worsening hypercarbia PCCM consulted, appreciate recs Continue bipap, duonebs/inhaler, steroids, holding lasix Continue to monitor in ICU  Type A Thoracic Aortic dissection CT showed type A thoracic aortic dissection, 4.7cm Not a surgical candidate ECHO showed: EF of 55-60%, PA peak pressure of 24mmhg Started on Esmolol gtt to keep SBP 100-120 mmhg  ??Post-obstructive pneumonia/radiation fibrosis Afebrile,  with leukocytosis (on steroids) Procalcitonin trending down to 0.34 Urine strep pneumo, Legionella both negative BC x2, NGTD Chest x-ray: Extensive peribronchial airspace consolidation versus lymphangitic spread of tumor, repeat with now worsening vascular congestion CT chest: Continued presence of large right lower lobe mass with pleural spread is noted consistent with history of lung cancer. 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 is well Continue IV ceftriaxone and azithromycin  AKI Resolved Daily BMP  Iron deficiency/anemia of chronic disease Baseline hemoglobin between 10-11 Status post IV iron on 02/26/2018 Daily CBC  Squamous cell carcinoma of the right lung Oncology following, on Keytruda (oncology considering switching to chemo) Plan for Port-A-Cath placement  Acute on chronic diastolic heart failure Echo showed EF of 65 to 81%, grade 2 diastolic dysfunction Continue IV lasix 40 mg  Hypertension Stable Continue amlodipine, metoprolol  Obstructive sleep apnea On bipap  GERD Continue Protonix  Deconditioning/GOC discussion Palliative consulted, spoke with sister, still wants full code    Code Status: DNR  Family Communication: None at bedside  Disposition Plan: Once significant improvement  Consultants:  Oncology  PCCM  Procedures:  None  Antimicrobials:  Ceftriaxone  Azithromycin  DVT prophylaxis: SCDs   Objective: Vitals:   02/28/18 2000 02/28/18 2044 02/28/18 2054 02/28/18 2101  BP: (!) 128/57 (!) 128/56    Pulse: 65 65    Resp: 17 14    Temp: (!) 96.4 F (35.8 C)     TempSrc: Axillary     SpO2: 94% 94% 96% 98%  Weight:      Height:        Intake/Output Summary (Last 24 hours) at 02/28/2018 2102 Last data filed at 02/28/2018 2000 Gross per 24 hour  Intake 1007.45 ml  Output  1725 ml  Net -717.55 ml   Filed Weights   02/08/2018 1437  Weight: 104.3 kg (230 lb)    Exam:   General: NAD, on  bipap  Cardiovascular: S1, S2 present  Respiratory: Diminished air entry bilaterally  Abdomen: Soft, obese, nontender, nondistended, bowel sounds present  Musculoskeletal: No pedal edema bilaterally  Skin: Normal  Psychiatry: Anxious   Data Reviewed: CBC: Recent Labs  Lab 02/09/2018 1454 02/25/18 0515 02/26/18 0526 02/27/18 0527 02/28/18 0311  WBC 16.9* 16.6* 16.4* 14.9* 11.2*  NEUTROABS 15.3*  --  15.3* 13.7* 10.5*  HGB 10.9*  12.2 10.7* 9.5* 10.0* 9.5*  HCT 35.4*  36.0 35.9* 32.2* 33.8* 32.0*  MCV 94.4 97.8 97.3 96.6 96.4  PLT 490* 453* 416* 411* 272   Basic Metabolic Panel: Recent Labs  Lab 02/05/2018 1454 02/25/18 0515 02/26/18 0526 02/27/18 0527 02/28/18 0311  NA 136  133* 136 134* 136 139  K 4.1  4.1 5.0 4.4 4.5 4.7  CL 89*  87* 91* 94* 92* 91*  CO2 34* 31 32 33* 39*  GLUCOSE 129*  127* 105* 93 96 96  BUN 17  14 31* 37* 29* 29*  CREATININE 0.91  1.00 1.77* 1.15* 0.85 0.82  CALCIUM 9.2 8.7* 8.4* 8.9 8.8*   GFR: Estimated Creatinine Clearance: 79.3 mL/min (by C-G formula based on SCr of 0.82 mg/dL). Liver Function Tests: Recent Labs  Lab 02/07/2018 1454 02/25/18 0515  AST 18 14*  ALT 24 22  ALKPHOS 95 84  BILITOT 0.6 0.8  PROT 7.8 7.1  ALBUMIN 3.0* 2.6*   No results for input(s): LIPASE, AMYLASE in the last 168 hours. No results for input(s): AMMONIA in the last 168 hours. Coagulation Profile: Recent Labs  Lab 02/26/18 0526  INR 1.16   Cardiac Enzymes: No results for input(s): CKTOTAL, CKMB, CKMBINDEX, TROPONINI in the last 168 hours. BNP (last 3 results) No results for input(s): PROBNP in the last 8760 hours. HbA1C: No results for input(s): HGBA1C in the last 72 hours. CBG: Recent Labs  Lab 02/27/18 1106  GLUCAP 139*   Lipid Profile: No results for input(s): CHOL, HDL, LDLCALC, TRIG, CHOLHDL, LDLDIRECT in the last 72 hours. Thyroid Function Tests: No results for input(s): TSH, T4TOTAL, FREET4, T3FREE, THYROIDAB in the last 72  hours. Anemia Panel: No results for input(s): VITAMINB12, FOLATE, FERRITIN, TIBC, IRON, RETICCTPCT in the last 72 hours. Urine analysis:    Component Value Date/Time   COLORURINE YELLOW 02/26/2018 1627   APPEARANCEUR CLEAR 02/26/2018 1627   LABSPEC 1.018 02/26/2018 1627   PHURINE 5.0 02/26/2018 1627   GLUCOSEU NEGATIVE 02/26/2018 1627   HGBUR NEGATIVE 02/26/2018 1627   BILIRUBINUR NEGATIVE 02/26/2018 1627   KETONESUR NEGATIVE 02/26/2018 1627   PROTEINUR NEGATIVE 02/26/2018 1627   NITRITE NEGATIVE 02/26/2018 1627   LEUKOCYTESUR NEGATIVE 02/26/2018 1627   Sepsis Labs: @LABRCNTIP (procalcitonin:4,lacticidven:4)  ) Recent Results (from the past 240 hour(s))  Blood Culture (routine x 2)     Status: None (Preliminary result)   Collection Time: 02/02/2018  2:47 PM  Result Value Ref Range Status   Specimen Description   Final    BLOOD RIGHT ANTECUBITAL Performed at [facility], [place] [address] [place], [place] [address]    Special Requests   Final    BOTTLES DRAWN AEROBIC AND ANAEROBIC Blood Culture adequate volume Performed at [place] [address], [place], [place] [address]    Culture   Final    NO GROWTH 4 DAYS Performed at  [facility], [place] [address], [place], [place] [address]    Report Status PENDING  Incomplete  Blood Culture (routine x 2)     Status: None (Preliminary result)   Collection Time: 02/03/2018  2:48 PM  Result Value Ref Range Status   Specimen Description   Final    BLOOD LEFT FOREARM Performed at [place] [address] [place], [place] [address]    Special Requests   Final    BOTTLES DRAWN AEROBIC AND ANAEROBIC Blood Culture adequate volume Performed at [place] [address] [place], [place] [address]    Culture   Final    NO GROWTH 4 DAYS Performed at [facility], [place] [address], [place], [place] [address]    Report Status PENDING  Incomplete   MRSA PCR Screening     Status: None   Collection Time: 02/25/2018  4:50 PM  Result Value Ref Range Status   MRSA by PCR NEGATIVE NEGATIVE Final    Comment:        The GeneXpert MRSA Assay (FDA approved for NASAL specimens only), is one component of a comprehensive MRSA colonization surveillance program. It is not intended to diagnose MRSA infection nor to guide or monitor treatment for MRSA infections. Performed at [facility], [place] [address] [place], [place] [address]   Culture, sputum-assessment     Status: None   Collection Time: 02/26/18  4:40 AM  Result Value Ref Range Status   Specimen Description SPUTUM  Final   Special Requests NONE  Final   Sputum evaluation   Final    Sputum specimen not acceptable for testing.  Please recollect.   [place] 5631 02/26/18 A NAVARRO Performed at [place] [address] [place], [place] [address]    Report Status 02/26/2018 FINAL  Final  Urine Culture     Status: None   Collection Time: 02/26/18  5:45 PM  Result Value Ref Range Status   Specimen Description   Final    URINE, RANDOM Performed at [place] [address], [place], [place] [address]    Special Requests   Final    NONE Performed at [facility], [place] [address] [place], [place] [address]    Culture   Final    NO GROWTH Performed at [facility], [place] [address] [place], [place] [address]    Report Status 02/28/2018 FINAL  Final      Studies: Ct Angio Chest Pe W Or Wo Contrast  Result Date: 02/27/2018 CLINICAL DATA:  Lung cancer, elevated D-dimer EXAM: CT ANGIOGRAPHY CHEST WITH CONTRAST TECHNIQUE: Multidetector CT imaging of the chest was performed using the standard protocol during bolus administration of intravenous contrast. Multiplanar CT image reconstructions and MIPs were obtained to evaluate the vascular anatomy. CONTRAST:  161mL ISOVUE-370 IOPAMIDOL  (ISOVUE-370) INJECTION 76% COMPARISON:  CT chest dated 02/23/2018 FINDINGS: Cardiovascular: Satisfactory opacification the bilateral pulmonary arteries to the lobar level. No evidence of pulmonary embolism. 4.7 cm ascending thoracic aortic aneurysm, rapidly increasing from recent CT chest, when it measured 4.0 cm. Although unusual in appearance and suboptimally evaluated due to study technique, there is differential enhancement within the lumen, suggesting in ascending thoracic aortic dissection (series 6/image 44). This is favored to be a type A aortic dissection arising at the aortic root and terminating at the origin of the right brachiocephalic artery. Cardiomegaly.  No pericardial effusion.  Atherosclerotic calcifications of the aortic arch. Three vessel coronary atherosclerosis. Enlargement of the main pulmonary artery, raising the possibility of pulmonary arterial hypertension. Mediastinum/Nodes: 13 mm short axis distinct subcarinal node. 2.0 cm short axis azygoesophageal recess node. Visualized thyroid is unremarkable. Lungs/Pleura: Large right lower lobe mass, unchanged from recent CT, with lymphangitic and pleural spread in the right hemithorax. Radiation changes in the anterior left upper lobe. Patchy left lower lobe opacity, likely atelectasis. Small pleural effusion. Mild centrilobular and paraseptal emphysematous changes, upper lobe predominant. No pneumothorax. Upper Abdomen: Visualized upper abdomen is grossly unremarkable, noting a mildly nodular hepatic contour. Musculoskeletal: Visualized osseous structures are within normal limits. Review of the MIP images confirms the above findings. IMPRESSION: No evidence of pulmonary embolism. 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 Rapid enlargement of the ascending thoracic aorta, now measuring 4.7 cm,  previously 4.0 cm on recent CT. Thoracic surgery consultation is suggested. Otherwise unchanged from recent CT and PET. Extensive tumor in the right lower lobe/hemithorax with pleural and nodal metastases, better evaluated on prior studies. Critical Value/emergent results were called by telephone at the time of interpretation on 02/27/2018 at 9:57 pm to Dr. Halford Chessman, who verbally acknowledged these results. Aortic Atherosclerosis (ICD10-I70.0) and Emphysema (ICD10-J43.9). Electronically Signed   By: Julian Hy M.D.   On: 02/27/2018 22:00    Scheduled Meds: . amLODipine  10 mg Oral Daily  . arformoterol  15 mcg Nebulization BID  . budesonide (PULMICORT) nebulizer solution  0.5 mg Nebulization BID  . dextromethorphan-guaiFENesin  1 tablet Oral BID  . levalbuterol  0.63 mg Nebulization TID  . methylPREDNISolone (SOLU-MEDROL) injection  40 mg Intravenous Q12H  . pantoprazole  40 mg Oral BID  . sodium chloride HYPERTONIC  4 mL Nebulization BID    Continuous Infusions: . azithromycin Stopped (02/28/18 1212)  . cefTRIAXone (ROCEPHIN)  IV Stopped (02/28/18 1142)  . esmolol 175.136 mcg/kg/min (02/28/18 2000)     LOS: 4 days     Alma Friendly, MD [facility]  If 7PM-7AM, please contact night-coverage [email or web address] Password [email or web address] 02/28/2018, 9:02 PM

## 2018-02-28 NOTE — Progress Notes (Signed)
Daily Progress Note   Patient Name: Alisha Fisher       Date: 02/28/2018 DOB: 31-Jan-1946  Age: 72 y.o. MRN#: [id number] Attending Physician: Alma Friendly, MD Primary Care Physician: [name], MD Admit Date: 02/16/2018  Reason for Consultation/Follow-up: Establishing goals of care  Subjective:  Patient is on the BIPAP, awake, is able to comprehend conversations, able to answer appropriately.   Overnight events noted, patient underwent a repeat CT chest w/ angiogram ordered on 5/30, incidentally found to have suspected type A thoracic aortic dissection extending down to aortic root.   See below:  Length of Stay: 4  Current Medications: Scheduled Meds:  . amLODipine  10 mg Oral Daily  . arformoterol  15 mcg Nebulization BID  . budesonide (PULMICORT) nebulizer solution  0.5 mg Nebulization BID  . dextromethorphan-guaiFENesin  1 tablet Oral BID  . levalbuterol  0.63 mg Nebulization TID  . methylPREDNISolone (SOLU-MEDROL) injection  40 mg Intravenous Q12H  . pantoprazole  40 mg Oral BID  . sodium chloride HYPERTONIC  4 mL Nebulization BID    Continuous Infusions: . azithromycin Stopped (02/27/18 1210)  . cefTRIAXone (ROCEPHIN)  IV    . esmolol 150 mcg/kg/min (02/28/18 0906)    PRN Meds: acetaminophen **OR** acetaminophen, clonazePAM, diphenhydrAMINE, fluticasone, HYDROcodone-acetaminophen, levalbuterol, LORazepam, ondansetron **OR** ondansetron (ZOFRAN) IV  Physical Exam         Obese appearing lady Resting in bed BIPAP on Has non verbal gestures of anxiety evident: look of fear, furrowing of brow, clenched fists sometimes.  Regular Doesn't appear to have edema Is awake, is alert, answers questions appropriately  Vital Signs: BP (!) 142/84   Pulse 72   Temp  (!) 97.4 F (36.3 C) (Axillary)   Resp 20   Ht '5\' 7"'$  (1.702 m)   Wt 104.3 kg (230 lb)   SpO2 99%   BMI 36.02 kg/m  SpO2: SpO2: 99 % O2 Device: O2 Device: Bi-PAP O2 Flow Rate: O2 Flow Rate (L/min): 4 L/min  Intake/output summary:   Intake/Output Summary (Last 24 hours) at 02/28/2018 1035 Last data filed at 02/28/2018 0906 Gross per 24 hour  Intake 426.72 ml  Output 3380 ml  Net -2953.28 ml   LBM: Last BM Date: 02/25/18 Baseline Weight: Weight: 104.3 kg (230 lb) Most recent weight: Weight: 104.3 kg (230 lb)  Palliative Assessment/Data: PPS 30%     Patient Active Problem List   Diagnosis Date Noted  . 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 AKI (acute kidney injury) ([place]) 02/25/2018  . COPD with acute exacerbation ([place]) 02/01/2018  . Pleural effusion 02/12/2018  . Community acquired pneumonia 01/30/2018  . Community acquired pneumonia of right lower lobe of lung ([place]) 02/06/2018  . Encounter for palliative care 02/12/2018  . Hilar mass   . Anxiety disorder 12/05/2017  . Iron deficiency anemia 11/07/2017  . OSA (obstructive sleep apnea) 10/17/2017  . 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 Constipation   . Chronic diastolic heart failure ([place]) 12/30/2016  . Squamous cell lung cancer, right ([place]) 12/25/2016  . Rash and nonspecific skin eruption   . Malignant neoplasm of bronchus of left upper lobe ([place]) 12/20/2016  . Chronic respiratory failure with hypoxia ([place]) 12/18/2016  . Essential hypertension 12/18/2016  . Anemia 12/18/2016  . Hypokalemia 12/16/2016  . Hypomagnesemia 12/16/2016  . Intraperitoneal abscess s/p perc drainage 12/14/2016 12/15/2016  . Acute appendicitis with perforation and peritoneal abscess 12/03/2016  . Obesity 05/25/2012  . Smoker 05/25/2012  . COPD (chronic obstructive pulmonary disease) ([place]) 05/25/2012    Palliative Care Assessment & Plan   Patient Profile:    Assessment: Acute on chronic resp failure with hypercarbia, R  lung tumor, severe COPD, sleep apnea, possible PNA, now also found to have type A thoracic aortic dissection.   Discussions:  I met with the patient this morning, along with Dr Lake Bells, Marni Griffon, NP and bedside RN all present in the room. Dr Lake Bells presented all the serious illnesses the patient is up against: her lung cancer, severe COPD, her CT showing enlargement of ascending thoracic aorta, high concern for dissection. We discussed frankly and compassionately with patient about continuing current therapies to help keep the patient as well as we can, for as long we can, how ever, in the event that her heart stopped or she stopped breathing- at end of life, we unanimously do not think that her undergoing the full scope of a resuscitative attempt with CPR, intubation and mechanical ventilation, use of ACLS protocol etc will be of any benefit, instead, these heroic measures may cause more harm. Patient appeared to understand and follow along.   Call placed and discussed with son [phone], he stated that the patient has 2 sons, how ever, Raquel Sarna states that the his brother's "mind isn't really there", he will notify his brother about the serious nature of the patient's illness. Shawn understands the patient's overall condition and regarding DNR DNI, he states, "that's understandable and reasonable, given the circumstances."  Call placed and discussed with sister [name], she was updated by PCCM MD overnight about the results of the CT angio showing enlarging ascending thoracic aorta, high concern for dissection, she is asking about ECHO and what treatment was being done for this.   We then frankly discussed about the fact that appropriate medical measures were being employed, patient is on BP medications, how ever, she is not a surgical candidate. Regarding DNR DNI, [name] states, "she wouldn't want that. I don't want that for her."   We reviewed how ongoing medical measures would be done to  optimize the patient as best as we can, how ever, we strongly recommend DNR DNI, sister [name] is aware and in agreement.    Recommendations/Plan:  Code status now established as DNR DNI.    Spiritual care consult for additional  support.  Continue current mode of care.   PMT to follow along.    Code Status:    Code Status Orders  (From admission, onward)        Start     Ordered   02/28/18 1035  Do not attempt resuscitation (DNR)  Continuous    Question Answer Comment  In the event of cardiac or respiratory ARREST Do not call a "code blue"   In the event of cardiac or respiratory ARREST Do not perform Intubation, CPR, defibrillation or ACLS   In the event of cardiac or respiratory ARREST Use medication by any route, position, wound care, and other measures to relive pain and suffering. May use oxygen, suction and manual treatment of airway obstruction as needed for comfort.      02/28/18 1035    Code Status History    Date Active Date Inactive Code Status Order ID Comments User Context   02/21/2018 1651 02/28/2018 1035 Full Code [id number]  Edwin Dada, MD ED   08/15/2017 0106 08/18/2017 2031 Full Code [id number]  Phillips Grout, MD Inpatient   12/30/2016 1859 01/01/2017 1926 Full Code [id number]  Benito Mccreedy, MD Inpatient   12/03/2016 2125 12/26/2016 1900 Full Code [id number]  Johnathan Hausen, MD ED    Advance Directive Documentation     Most Recent Value  Type of Advance Directive  Healthcare Power of Attorney, Living will  Pre-existing out of facility DNR order (yellow form or pink MOST form)  -  "MOST" Form in Place?  -       Prognosis:   guarded, may be as short as few days to some very limited number of weeks at this point, I have discussed this frankly and compassionately with sister [name].   Discharge Planning:  To Be Determined  Care plan was discussed with  Patient, sister [name] over the phone [phone], as well as with son Raquel Sarna over the phone [phone].  Also discussed with Dr Lake Bells and Marni Griffon, NP from [facility] service, and with her bedside RN.   Thank you for allowing the Palliative Medicine Team to assist in the care of this patient.   Time In:  10 Time Out: 10.35 Total Time 35 Prolonged Time Billed  no       Greater than 50%  of this time was spent counseling and coordinating care related to the above assessment and plan.  Loistine Chance, MD [phone]  Please contact Palliative Medicine Team phone at [phone] for questions and concerns.

## 2018-02-28 NOTE — Progress Notes (Signed)
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  She had a bronchoscopy in March 2019 which confirmed suspicion of recurrent squamous cell carcinoma and then she started treatment with immunotherapy (Pembrolizumab) in May 2019.  She has received 1 treatment.  She presented to [facility] on Feb 24, 2018 complaining of 4 to 5 days of chest congestion and lethargy and shortness of breath.  A CT scan performed after admission showed persistent right upper lobe radiation fibrosis, and what appears to be significant growth of the right lower lobe mass with pleural involvement versus consolidation and pneumonia.  She was admitted by the hospitalist service and has been receiving treatment for a COPD exacerbation, healthcare associated pneumonia, and has received some diuretic therapy.  On 5/30 she was transferred to the intensive care unit because of increasing work of breathing and lethargy.  She was noted to be hypercarbic.  She was started on noninvasive mechanical ventilation after arrival.  Shortly after starting on that treatment her anxiety dramatically worsened and so she was given Ativan.  Pulmonary and critical care medicine was consulted because of the patient's worsening respiratory status and a continued FULL CODE STATUS.  Since arrival to the intensive care unit she has become less anxious and is more compliant with NIMV.  ->has very poor functional status and limited activity status at baseline for at least the last year leading up to this.   SUBJECTIVE/Interval:  On Bipap, 40%FiO2 Repeat CT chest w/ angiogram  ordered on 5/30 by primary service incidentally found: suspected type A thoracic aortic dissection extending down to aortic root.  Esmolol gtt infusing; not a surgical candidate family made aware not likely to survive  Remains lethargic   VITAL SIGNS: BP (!) 149/69   Pulse 70   Temp (!) 97.4 F (36.3 C) (Axillary)   Resp 15   Ht 5\' 7"  (1.702 m)   Wt 230 lb (104.3 kg)   SpO2 98%   BMI 36.02 kg/m   HEMODYNAMICS:    VENTILATOR SETTINGS: FiO2 (%):  [35 %-50 %] 40 %  INTAKE / OUTPUT: I/O last 3 completed shifts: In: 281.3 [I.V.:281.3] Out: 3380 [Urine:3380]  PHYSICAL EXAMINATION: General:  Acute on chronically ill-appearing female, laying in bed on bipap, in no acute distress Neuro:  Lethargic, arouses to voice, Disoriented to time, Follows commands HEENT:  Atraumatic, normocephalic, Bipap mask in place Cardiovascular:  RRR, s1s2 noted Lungs:  Mild expiratory wheezing throughout, crackles RLL, symmetrical expansion, NIMV support, no assessory msucle use Abdomen:  Obese, soft, non-tender, BS faint x4 Musculoskeletal:  No deformities, normal bulk and tone Skin:  No obvious rashes, lesions, or ulcerations  LABS:  BMET Recent Labs  Lab 02/26/18 0526 02/27/18 0527 02/28/18 0311  NA 134* 136 139  K 4.4 4.5 4.7  CL 94* 92* 91*  CO2 32 33* 39*  BUN 37* 29* 29*  CREATININE 1.15* 0.85 0.82  GLUCOSE 93 96 96    Electrolytes Recent Labs  Lab 02/26/18 0526 02/27/18 0527 02/28/18 0311  CALCIUM 8.4* 8.9 8.8*    CBC Recent Labs  Lab 02/26/18 0526 02/27/18  0527 02/28/18 0311  WBC 16.4* 14.9* 11.2*  HGB 9.5* 10.0* 9.5*  HCT 32.2* 33.8* 32.0*  PLT 416* 411* 360    Coag's Recent Labs  Lab 02/26/18 0526  INR 1.16    Sepsis Markers Recent Labs  Lab 02/16/2018 1454 01/29/2018 1925 02/25/18 0515 02/26/18 0526  LATICACIDVEN 1.37  --   --   --   PROCALCITON  --  0.53 0.61 0.34    ABG Recent Labs  Lab 02/27/18 1100  PHART 7.244*  PCO2ART 88.7*  PO2ART  73.2*    Liver Enzymes Recent Labs  Lab 02/09/2018 1454 02/25/18 0515  AST 18 14*  ALT 24 22  ALKPHOS 95 84  BILITOT 0.6 0.8  ALBUMIN 3.0* 2.6*    Cardiac Enzymes No results for input(s): TROPONINI, PROBNP in the last 168 hours.  Glucose Recent Labs  Lab 02/27/18 1106  GLUCAP 139*    Imaging Ct Angio Chest Pe W Or Wo Contrast  Result Date: 02/27/2018 CLINICAL DATA:  Lung cancer, elevated D-dimer EXAM: CT ANGIOGRAPHY CHEST WITH CONTRAST TECHNIQUE: Multidetector CT imaging of the chest was performed using the standard protocol during bolus administration of intravenous contrast. Multiplanar CT image reconstructions and MIPs were obtained to evaluate the vascular anatomy. CONTRAST:  150mL ISOVUE-370 IOPAMIDOL (ISOVUE-370) INJECTION 76% COMPARISON:  CT chest dated 02/13/2018 FINDINGS: Cardiovascular: Satisfactory opacification the bilateral pulmonary arteries to the lobar level. No evidence of pulmonary embolism. 4.7 cm ascending thoracic aortic aneurysm, rapidly increasing from recent CT chest, when it measured 4.0 cm. Although unusual in appearance and suboptimally evaluated due to study technique, there is differential enhancement within the lumen, suggesting in ascending thoracic aortic dissection (series 6/image 44). This is favored to be a type A aortic dissection arising at the aortic root and terminating at the origin of the right brachiocephalic artery. Cardiomegaly.  No pericardial effusion. Atherosclerotic calcifications of the aortic arch. Three vessel coronary atherosclerosis. Enlargement of the main pulmonary artery, raising the possibility of pulmonary arterial hypertension. Mediastinum/Nodes: 13 mm short axis distinct subcarinal node. 2.0 cm short axis azygoesophageal recess node. Visualized thyroid is unremarkable. Lungs/Pleura: Large right lower lobe mass, unchanged from recent CT, with lymphangitic and pleural spread in the right hemithorax. Radiation changes in the anterior  left upper lobe. Patchy left lower lobe opacity, likely atelectasis. Small pleural effusion. Mild centrilobular and paraseptal emphysematous changes, upper lobe predominant. No pneumothorax. Upper Abdomen: Visualized upper abdomen is grossly unremarkable, noting a mildly nodular hepatic contour. Musculoskeletal: Visualized osseous structures are within normal limits. Review of the MIP images confirms the above findings. IMPRESSION: No evidence of pulmonary embolism. 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 Rapid enlargement of the ascending thoracic aorta, now measuring 4.7 cm, previously 4.0 cm on recent CT. Thoracic surgery consultation is suggested. Otherwise unchanged from recent CT and PET. Extensive tumor in the right lower lobe/hemithorax with pleural and nodal metastases, better evaluated on prior studies. Critical Value/emergent results were called by telephone at the time of interpretation on 02/27/2018 at 9:57 pm to Dr. Halford Chessman, who verbally acknowledged these results. Aortic Atherosclerosis (ICD10-I70.0) and Emphysema (ICD10-J43.9). Electronically Signed   By: Julian Hy M.D.   On: 02/27/2018 22:00     STUDIES:  5/30 CT angio of chest: No evidence of pulmonary embolism. Although not tailored for evaluation of the thoracic aorta, there is a suspected type A  thoracic aortic dissection extending from the aortic root to the origin of the right brachycephalic artery. Rapid enlargement of the ascending thoracic aorta, now measuring 4.7 cm,previously 4.0 cm on recent CT.   CULTURES:   ANTIBIOTICS:   SIGNIFICANT EVENTS: 5/27>>Admssion to [place] 5/30>> CT reveals Type A aortic dissection  LINES/TUBES: Foley  DISCUSSION: 72 year old female with acute on chronic respiratory failure with hypercarbia in the setting of severe COPD,  recurrent squamous cell carcinoma, right upper lobe radiation fibrosis, and worsening airspace disease and mass-effect from right lower lobe lung mass.  She may have some degree of pleural effusion in the right lung but it is not evident from the CT scan.  Her case is complicated by severe deconditioning and recent lung cancer treatment.  I am being consulted for assistance in management with hypercarbic respiratory failure.  I believe there are multiple causes for her acute respiratory failure with hypercarbia: Acute pulmonary edema, acute exacerbation of COPD, morbid obesity, severe deconditioning, significant tumor burden in the right lower lobe.  She may have healthcare associated pneumonia though I am not convinced of that.  If her condition continues to worsen and if she were to require invasive mechanical ventilation or CPR in the event of a cardiac arrest the likelihood of survival is near 0.  Her multiple advanced chronic conditions and severity of the current acute conditions all predict a poor outcome.  See discussion below.    ASSESSMENT / PLAN:  PULMONARY A: Acute on Chronic Respiratory Failure with Hypercarbia Acute Pulmonary edema>>improving Right Lung tumor Possible right Lung Pleural Effusion (not clear from CT imaging) Severe COPD ? 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 CAP>>improving Plan:   Monitor fever curve Trend WBC's Continue Azithromycin & Ceftriaxone    Intermittent Encephalopathy in setting of hypercarbia Plan:   Avoid Sedating medications Continue Bipap Provide supportive care   FAMILY  - Updates:  No family present at bedside in am 5/31.  - Inter-disciplinary family meet or Palliative Care meeting due by:  03/06/18   Prognosis is guarded, given Lung cancer and now Type A Thoracic Aortic Dissection, and not a surgical candidate for repair.  Need to follow up with family regarding code status, if Aortic dissection progresses, would likely be fatal and resuscitative measures would be futile at that point.    Darel Hong, AGACNP-BC [place] Pager: [phone]  02/28/2018, 8:49 AM

## 2018-02-28 NOTE — Progress Notes (Signed)
Unfortunately, Ms. Carrero is now down to the ICU.  She had a CT angiogram done yesterday.  She has a expanding thoracic a sending aortic aneurysm.  She is on a drip to try to help with her blood pressure and heart rate.  I am not sure how much this is affecting her breathing.    On the CT angiogram, the cancer so looked stable.  Her labs show that her white cell count is 11.2.  Hemoglobin 9.5.  Platelet count 360,000.  Her creatinine is 0.82.  Potassium 4.7.    She still has some anxiety.    Her oxygen saturation levels are are looking okay.  I am not sure how much she is eating.  I guess we will have to see what is going to happen with this a sending thoracic aortic aneurysm.  I am not sure what other evaluation will be done.  I do not know she needs some type of catheterization.  I am sure however that the specialist will figure out if this needs to be treated.  We will continue to follow along.  Lattie Haw, MD  1 [place] 5:5

## 2018-02-28 NOTE — Progress Notes (Signed)
  Echocardiogram 2D Echocardiogram has been performed.  Alisha Fisher 02/28/2018, 12:11 PM

## 2018-02-28 NOTE — Progress Notes (Addendum)
SLP Cancellation Note  Patient Details Name: Alisha Fisher MRN: [id number] DOB: Dec 31, 1945   Cancelled treatment:       Reason Eval/Treat Not Completed: Other (comment);Medical issues which prohibited therapy(pt npo, found to have possible ascending aortic dissection per chart review)   Macario Golds 02/28/2018, 8:22 AM

## 2018-02-28 NOTE — Progress Notes (Signed)
Patient taken off BiPAP and placed on high flow [place] at 10L. O2 sats 94-95%.

## 2018-03-01 ENCOUNTER — Inpatient Hospital Stay (HOSPITAL_COMMUNITY): Payer: Medicare Other

## 2018-03-01 LAB — CBC WITH DIFFERENTIAL/PLATELET
BASOS PCT: 0 %
Basophils Absolute: 0 10*3/uL (ref 0.0–0.1)
Eosinophils Absolute: 0 10*3/uL (ref 0.0–0.7)
Eosinophils Relative: 0 %
HEMATOCRIT: 29.8 % — AB (ref 36.0–46.0)
HEMOGLOBIN: 9 g/dL — AB (ref 12.0–15.0)
Lymphocytes Relative: 3 %
Lymphs Abs: 0.3 10*3/uL — ABNORMAL LOW (ref 0.7–4.0)
MCH: 28.3 pg (ref 26.0–34.0)
MCHC: 30.2 g/dL (ref 30.0–36.0)
MCV: 93.7 fL (ref 78.0–100.0)
MONOS PCT: 1 %
Monocytes Absolute: 0.2 10*3/uL (ref 0.1–1.0)
NEUTROS PCT: 96 %
Neutro Abs: 10.6 10*3/uL — ABNORMAL HIGH (ref 1.7–7.7)
Platelets: 355 10*3/uL (ref 150–400)
RBC: 3.18 MIL/uL — AB (ref 3.87–5.11)
RDW: 14.9 % (ref 11.5–15.5)
WBC: 11.1 10*3/uL — AB (ref 4.0–10.5)

## 2018-03-01 LAB — CULTURE, BLOOD (ROUTINE X 2)
CULTURE: NO GROWTH
Culture: NO GROWTH
SPECIAL REQUESTS: ADEQUATE
SPECIAL REQUESTS: ADEQUATE

## 2018-03-01 LAB — BASIC METABOLIC PANEL
Anion gap: 11 (ref 5–15)
BUN: 43 mg/dL — ABNORMAL HIGH (ref 6–20)
CHLORIDE: 91 mmol/L — AB (ref 101–111)
CO2: 33 mmol/L — AB (ref 22–32)
Calcium: 9.1 mg/dL (ref 8.9–10.3)
Creatinine, Ser: 0.98 mg/dL (ref 0.44–1.00)
GFR calc non Af Amer: 57 mL/min — ABNORMAL LOW (ref >60)
Glucose, Bld: 99 mg/dL (ref 65–99)
POTASSIUM: 5.1 mmol/L (ref 3.5–5.1)
Sodium: 135 mmol/L (ref 135–145)

## 2018-03-01 NOTE — Progress Notes (Signed)
PROGRESS NOTE  Alisha Fisher IHK:[id number] DOB: 1945-11-12 DOA: 02/05/2018 PCP: [name], MD  HPI/Recap of past 24 hours: Patient is a 72 year old female with medical history significant for locally advanced squamous cell carcinoma of the lung, COPD on home oxygen 3 L/min, wheelchair-bound, morbid obesity, OSA, diastolic heart failure, chronic iron deficiency anemia, hypertension presented to the ED with a 5-day history of malaise, worsening cough/shortness of breath.  In the ED, patient was found to be hypoxic, febrile and tachycardic.  Patient admitted for further management  Today, patient seen in ICU was on bipap, reported being thirsty. No acute events overnight, no worsening respiratory status.  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[place])   Acute on chronic respiratory failure with hypercapnia (HCC)   Acute on chronic hypoxic and hypercapnic respiratory failure/COPD/obesity hypoventilation syndrome/Acute pulmonary edema Currently on bipap ABG with worsening hypercarbia PCCM consulted, appreciate recs Continue bipap, duonebs/inhaler, steroids, holding lasix Continue to monitor in ICU  Type A Thoracic Aortic dissection CT showed type A thoracic aortic dissection, 4.7cm Not a surgical candidate ECHO showed: EF of 55-60%, PA peak pressure of 83mmhg Continue on Esmolol gtt to keep SBP 100-120 mmhg, amlodipine  ??Post-obstructive pneumonia/radiation  fibrosis Afebrile, with leukocytosis (on steroids) Procalcitonin trending down to 0.34 Urine strep pneumo, Legionella both negative BC x2, NGTD Chest x-ray: Extensive peribronchial airspace consolidation versus lymphangitic spread of tumor, repeat with now worsening vascular congestion CT chest: Continued presence of large right lower lobe mass with pleural spread is noted consistent with history of lung cancer. 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 is well Continue IV ceftriaxone and azithromycin  AKI Resolved Daily BMP  Iron deficiency/anemia of chronic disease Baseline hemoglobin between 10-11 Status post IV iron on 02/26/2018 Daily CBC  Squamous cell carcinoma of the right lung Oncology following, on Keytruda (oncology considering switching to chemo) Plan for Port-A-Cath placement  Acute on chronic diastolic heart failure Echo showed EF of 65 to 75%, grade 2 diastolic dysfunction Held lasix  Hypertension Stable, controlled Continue esmolol drip, amlodipine  Obstructive sleep apnea On bipap  GERD Continue Protonix  Deconditioning/GOC discussion Palliative consulted    Code Status: DNR  Family Communication: Spoke to son Todd  Disposition Plan: Once significant improvement  Consultants:  Oncology  PCCM  Procedures:  None  Antimicrobials:  Ceftriaxone  Azithromycin  DVT prophylaxis: SCDs   Objective: Vitals:   03/01/18 1615 03/01/18 1630 03/01/18 1645 03/01/18 1700  BP: (!) 112/49 (!) 111/40 (!) 96/38 (!) 113/44  Pulse: 65 65 65 64  Resp: (!) 22 20 (!) 29 18  Temp:      TempSrc:      SpO2: 96% 96% 94% 96%  Weight:      Height:        Intake/Output Summary (Last 24 hours) at 03/01/2018 1827 Last data filed at 03/01/2018 1244 Gross per 24 hour  Intake 788.09 ml  Output 410 ml  Net 378.09 ml  Filed Weights   02/15/2018 1437  Weight: 104.3 kg (230 lb)    Exam:   General: NAD, on bipap  Cardiovascular: S1, S2  present  Respiratory: Diminished air entry bilaterally  Abdomen: Soft, obese, nontender, nondistended, bowel sounds present  Musculoskeletal: No pedal edema bilaterally  Skin: Normal  Psychiatry: Anxious   Data Reviewed: CBC: Recent Labs  Lab 02/22/2018 1454 02/25/18 0515 02/26/18 0526 02/27/18 0527 02/28/18 0311 03/01/18 0336  WBC 16.9* 16.6* 16.4* 14.9* 11.2* 11.1*  NEUTROABS 15.3*  --  15.3* 13.7* 10.5* 10.6*  HGB 10.9*  12.2 10.7* 9.5* 10.0* 9.5* 9.0*  HCT 35.4*  36.0 35.9* 32.2* 33.8* 32.0* 29.8*  MCV 94.4 97.8 97.3 96.6 96.4 93.7  PLT 490* 453* 416* 411* 360 734   Basic Metabolic Panel: Recent Labs  Lab 02/25/18 0515 02/26/18 0526 02/27/18 0527 02/28/18 0311 03/01/18 0336  NA 136 134* 136 139 135  K 5.0 4.4 4.5 4.7 5.1  CL 91* 94* 92* 91* 91*  CO2 31 32 33* 39* 33*  GLUCOSE 105* 93 96 96 99  BUN 31* 37* 29* 29* 43*  CREATININE 1.77* 1.15* 0.85 0.82 0.98  CALCIUM 8.7* 8.4* 8.9 8.8* 9.1   GFR: Estimated Creatinine Clearance: 66.4 mL/min (by C-G formula based on SCr of 0.98 mg/dL). Liver Function Tests: Recent Labs  Lab 02/23/2018 1454 02/25/18 0515  AST 18 14*  ALT 24 22  ALKPHOS 95 84  BILITOT 0.6 0.8  PROT 7.8 7.1  ALBUMIN 3.0* 2.6*   No results for input(s): LIPASE, AMYLASE in the last 168 hours. No results for input(s): AMMONIA in the last 168 hours. Coagulation Profile: Recent Labs  Lab 02/26/18 0526  INR 1.16   Cardiac Enzymes: No results for input(s): CKTOTAL, CKMB, CKMBINDEX, TROPONINI in the last 168 hours. BNP (last 3 results) No results for input(s): PROBNP in the last 8760 hours. HbA1C: No results for input(s): HGBA1C in the last 72 hours. CBG: Recent Labs  Lab 02/27/18 1106  GLUCAP 139*   Lipid Profile: No results for input(s): CHOL, HDL, LDLCALC, TRIG, CHOLHDL, LDLDIRECT in the last 72 hours. Thyroid Function Tests: No results for input(s): TSH, T4TOTAL, FREET4, T3FREE, THYROIDAB in the last 72 hours. Anemia  Panel: No results for input(s): VITAMINB12, FOLATE, FERRITIN, TIBC, IRON, RETICCTPCT in the last 72 hours. Urine analysis:    Component Value Date/Time   COLORURINE YELLOW 02/26/2018 1627   APPEARANCEUR CLEAR 02/26/2018 1627   LABSPEC 1.018 02/26/2018 1627   PHURINE 5.0 02/26/2018 1627   GLUCOSEU NEGATIVE 02/26/2018 1627   HGBUR NEGATIVE 02/26/2018 1627   BILIRUBINUR NEGATIVE 02/26/2018 1627   KETONESUR NEGATIVE 02/26/2018 1627   PROTEINUR NEGATIVE 02/26/2018 1627   NITRITE NEGATIVE 02/26/2018 1627   LEUKOCYTESUR NEGATIVE 02/26/2018 1627   Sepsis Labs: @LABRCNTIP (procalcitonin:4,lacticidven:4)  ) Recent Results (from the past 240 hour(s))  Blood Culture (routine x 2)     Status: None   Collection Time: 02/23/2018  2:47 PM  Result Value Ref Range Status   Specimen Description   Final    BLOOD RIGHT ANTECUBITAL Performed at [facility], [place] [address] [place], [place] [address]    Special Requests   Final    BOTTLES DRAWN AEROBIC AND ANAEROBIC Blood Culture adequate volume Performed at [place] [address] [place], [place] [address]    Culture   Final    NO GROWTH 5 DAYS Performed at [facility], [place] [address] [place],  [address]  Report Status 03/01/2018 FINAL  Final  Blood Culture (routine x 2)     Status: None   Collection Time: 02/01/2018  2:48 PM  Result Value Ref Range Status   Specimen Description   Final    BLOOD LEFT FOREARM Performed at [place] [address] [place], [place] [address]    Special Requests   Final    BOTTLES DRAWN AEROBIC AND ANAEROBIC Blood Culture adequate volume Performed at [place] [address] [place], [place] [address]    Culture   Final    NO GROWTH 5 DAYS Performed at [facility], [place] [address], [place], [place] [address]    Report Status 03/01/2018 FINAL  Final  MRSA PCR Screening     Status: None    Collection Time: 02/22/2018  4:50 PM  Result Value Ref Range Status   MRSA by PCR NEGATIVE NEGATIVE Final    Comment:        The GeneXpert MRSA Assay (FDA approved for NASAL specimens only), is one component of a comprehensive MRSA colonization surveillance program. It is not intended to diagnose MRSA infection nor to guide or monitor treatment for MRSA infections. Performed at [facility], [place] [address], [place], [place] [address]   Culture, sputum-assessment     Status: None   Collection Time: 02/26/18  4:40 AM  Result Value Ref Range Status   Specimen Description SPUTUM  Final   Special Requests NONE  Final   Sputum evaluation   Final    Sputum specimen not acceptable for testing.  Please recollect.   [place] 1962 02/26/18 A NAVARRO Performed at [place] [address], [place], [place] [address]    Report Status 02/26/2018 FINAL  Final  Urine Culture     Status: None   Collection Time: 02/26/18  5:45 PM  Result Value Ref Range Status   Specimen Description   Final    URINE, RANDOM Performed at [place] [address] [place], [place] [address]    Special Requests   Final    NONE Performed at [facility], [place] [address], [place], [place] [address]    Culture   Final    NO GROWTH Performed at [facility], [place] [address], [place],  [address]    Report Status 02/28/2018 FINAL  Final      Studies: Dg Chest Port 1 View  Result Date: 03/01/2018 CLINICAL DATA:  Acute respiratory failure. History of hypertension, lung cancer, COPD, PE, former smoker. EXAM: PORTABLE CHEST 1 VIEW COMPARISON:  Chest x-rays dated 02/27/2018 and 02/22/2018. FINDINGS: Cardiomegaly is stable. Overall cardiomediastinal silhouette is stable. Atherosclerotic changes again noted at the aortic arch. Stable opacity at the RIGHT lung base. New platelike opacity within the LEFT perihilar lung,  pneumonia versus atelectasis. No pneumothorax seen. IMPRESSION: 1. Stable cardiomegaly. 2. Persistent opacity at the RIGHT lung base, compatible with the mass/consolidation demonstrated on recent chest CT of 02/27/2018. 3. New platelike opacity within the LEFT perihilar lung, pneumonia versus atelectasis, less likely asymmetric edema. Electronically Signed   By: Franki Cabot M.D.   On: 03/01/2018 07:37    Scheduled Meds: . amLODipine  10 mg Oral Daily  . arformoterol  15 mcg Nebulization BID  . budesonide (PULMICORT) nebulizer solution  0.5 mg Nebulization BID  . dextromethorphan-guaiFENesin  1 tablet Oral BID  . levalbuterol  0.63 mg Nebulization TID  .  methylPREDNISolone (SOLU-MEDROL) injection  40 mg Intravenous Q12H  . pantoprazole  40 mg Oral BID    Continuous Infusions: . azithromycin Stopped (03/01/18 1359)  . cefTRIAXone (ROCEPHIN)  IV Stopped (03/01/18 1314)  . esmolol 250 mcg/kg/min (03/01/18 1705)     LOS: 5 days     Alma Friendly, MD [facility]  If 7PM-7AM, please contact night-coverage [email or web address] Password [facility] 03/01/2018, 6:27 PM

## 2018-03-01 DEATH — deceased

## 2018-03-02 DIAGNOSIS — J189 Pneumonia, unspecified organism: Secondary | ICD-10-CM

## 2018-03-02 LAB — BLOOD GAS, ARTERIAL
Acid-Base Excess: 5.3 mmol/L — ABNORMAL HIGH (ref 0.0–2.0)
BICARBONATE: 31.7 mmol/L — AB (ref 20.0–28.0)
DRAWN BY: 295031
Delivery systems: POSITIVE
Expiratory PAP: 5
FIO2: 35
INSPIRATORY PAP: 12
Mode: POSITIVE
O2 SAT: 94.2 %
PATIENT TEMPERATURE: 37
PO2 ART: 78.1 mmHg — AB (ref 83.0–108.0)
pCO2 arterial: 60.2 mmHg — ABNORMAL HIGH (ref 32.0–48.0)
pH, Arterial: 7.341 — ABNORMAL LOW (ref 7.350–7.450)

## 2018-03-02 LAB — CBC WITH DIFFERENTIAL/PLATELET
Basophils Absolute: 0 10*3/uL (ref 0.0–0.1)
Basophils Relative: 0 %
Eosinophils Absolute: 0 10*3/uL (ref 0.0–0.7)
Eosinophils Relative: 0 %
HEMATOCRIT: 31.2 % — AB (ref 36.0–46.0)
Hemoglobin: 9.5 g/dL — ABNORMAL LOW (ref 12.0–15.0)
LYMPHS ABS: 0.4 10*3/uL — AB (ref 0.7–4.0)
LYMPHS PCT: 4 %
MCH: 28.5 pg (ref 26.0–34.0)
MCHC: 30.4 g/dL (ref 30.0–36.0)
MCV: 93.7 fL (ref 78.0–100.0)
Monocytes Absolute: 0.2 10*3/uL (ref 0.1–1.0)
Monocytes Relative: 2 %
NEUTROS ABS: 9.7 10*3/uL — AB (ref 1.7–7.7)
Neutrophils Relative %: 94 %
PLATELETS: 343 10*3/uL (ref 150–400)
RBC: 3.33 MIL/uL — AB (ref 3.87–5.11)
RDW: 14.9 % (ref 11.5–15.5)
WBC: 10.4 10*3/uL (ref 4.0–10.5)

## 2018-03-02 LAB — BASIC METABOLIC PANEL
ANION GAP: 11 (ref 5–15)
BUN: 48 mg/dL — AB (ref 6–20)
CO2: 29 mmol/L (ref 22–32)
Calcium: 9.1 mg/dL (ref 8.9–10.3)
Chloride: 93 mmol/L — ABNORMAL LOW (ref 101–111)
Creatinine, Ser: 0.86 mg/dL (ref 0.44–1.00)
GFR calc Af Amer: >60 mL/min (ref >60)
GLUCOSE: 100 mg/dL — AB (ref 65–99)
POTASSIUM: 5.1 mmol/L (ref 3.5–5.1)
Sodium: 133 mmol/L — ABNORMAL LOW (ref 135–145)

## 2018-03-02 MED ORDER — DIPHENHYDRAMINE HCL 50 MG/ML IJ SOLN
25.0000 mg | Freq: Four times a day (QID) | INTRAMUSCULAR | Status: DC | PRN
  Administered 2018-03-02 – 2018-03-09 (×9): 25 mg via INTRAVENOUS
  Filled 2018-03-02 (×9): qty 1

## 2018-03-02 MED ORDER — LEVALBUTEROL HCL 0.63 MG/3ML IN NEBU
0.6300 mg | INHALATION_SOLUTION | RESPIRATORY_TRACT | Status: DC
  Administered 2018-03-02 – 2018-03-05 (×16): 0.63 mg via RESPIRATORY_TRACT
  Filled 2018-03-02 (×17): qty 3

## 2018-03-02 MED ORDER — NICARDIPINE HCL IN NACL 20-0.86 MG/200ML-% IV SOLN
5.0000 mg/h | INTRAVENOUS | Status: DC
  Administered 2018-03-02: 5 mg/h via INTRAVENOUS
  Administered 2018-03-02 (×5): 12.5 mg/h via INTRAVENOUS
  Administered 2018-03-02: 7.5 mg/h via INTRAVENOUS
  Administered 2018-03-02 – 2018-03-03 (×2): 12.5 mg/h via INTRAVENOUS
  Administered 2018-03-03 (×4): 15 mg/h via INTRAVENOUS
  Administered 2018-03-03: 10 mg/h via INTRAVENOUS
  Administered 2018-03-03: 12.5 mg/h via INTRAVENOUS
  Administered 2018-03-03: 15 mg/h via INTRAVENOUS
  Administered 2018-03-03: 12.5 mg/h via INTRAVENOUS
  Administered 2018-03-03: 10 mg/h via INTRAVENOUS
  Administered 2018-03-03 (×3): 15 mg/h via INTRAVENOUS
  Administered 2018-03-04 (×2): 5 mg/h via INTRAVENOUS
  Administered 2018-03-04: 7.5 mg/h via INTRAVENOUS
  Administered 2018-03-04: 15 mg/h via INTRAVENOUS
  Filled 2018-03-02 (×24): qty 200

## 2018-03-02 NOTE — Progress Notes (Signed)
Pt acute mental status change. 0800 pt was alert and was able to follow commands. Pt was off BiPAP for an hour. Now is unable to follow commands. Disoriented to person, place and time. Resumed BiPAP. Hospitalist made aware. Received order for ABG. RN will continue to monitor.

## 2018-03-02 NOTE — Progress Notes (Signed)
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  In the ED, patient was found to be hypoxic, febrile and tachycardic.  Patient admitted for further management  Today, patient seen in ICU, noted to have a brief period of AMS this am. Was off bipap yesterday for a couple of hours. No acute events overnight, no worsening respiratory status. BP noted to rise despite being on esmolol drip.  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[place])   Acute on chronic respiratory failure with hypercapnia (HCC)   Acute on chronic hypoxic and hypercapnic respiratory failure/COPD/obesity hypoventilation syndrome/Acute pulmonary edema Currently on bipap ABG with improved hypercarbia PCCM consulted, appreciate recs Continue bipap, duonebs/inhaler, steroids, holding lasix Continue to monitor in ICU  Type A Thoracic Aortic dissection CT showed type A thoracic aortic dissection, 4.7cm Not a surgical candidate, strict BP/HR control ECHO showed: EF of 55-60%, PA peak  pressure of 63mmhg Pharmacy reported shortage of esmolol gtt as pt is requiring a lot. Plan to wean off esmolol and add on cardene to keep SBP 100-120 mmhg Close monitoring  ??Post-obstructive pneumonia/radiation fibrosis Afebrile, with leukocytosis (on steroids) Procalcitonin trending down to 0.34 Urine strep pneumo, Legionella both negative BC x2, NGTD Chest x-ray: Extensive peribronchial airspace consolidation versus lymphangitic spread of tumor, repeat with now worsening vascular congestion CT chest: Continued presence of large right lower lobe mass with pleural spread is noted consistent with history of lung cancer. 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 is well Continue IV ceftriaxone and azithromycin  AKI Resolved Daily BMP  Iron deficiency/anemia of chronic disease Baseline hemoglobin between 10-11 Status post IV iron on 02/26/2018 Daily CBC  Squamous cell carcinoma of the right lung Oncology following, on Keytruda (oncology considering switching to chemo) Plan for Port-A-Cath placement once stable  Acute on chronic diastolic heart failure Echo showed EF of 65 to 67%, grade 2 diastolic dysfunction Held lasix  Hypertension Stable, controlled Continue esmolol drip, plan to wean off and start cardene  Obstructive sleep apnea On bipap  GERD Continue Protonix  Deconditioning/GOC discussion Palliative consulted due to very poor clinical condition, poor/guarded prognosis    Code Status: DNR  Family Communication: None at bedside  Disposition Plan: Once significant improvement  Consultants:  Oncology  PCCM  Palliative   Procedures:  None  Antimicrobials:  Ceftriaxone  Azithromycin  DVT prophylaxis: SCDs   Objective: Vitals:   03/02/18 1405 03/02/18 1410 03/02/18 1415 03/02/18 1420  BP: (!) 152/57 (!) 140/55 (!) 135/40 (!) 120/41  Pulse: 70 69 66 64  Resp: (!) 26 (!) 26 20 19   Temp:  TempSrc:      SpO2: 96% 96% 95% 95%    Weight:      Height:        Intake/Output Summary (Last 24 hours) at 03/02/2018 1541 Last data filed at 03/02/2018 1307 Gross per 24 hour  Intake 2132.99 ml  Output 750 ml  Net 1382.99 ml   Filed Weights   02/09/2018 1437  Weight: 104.3 kg (230 lb)    Exam:   General: NAD, on bipap  Cardiovascular: S1, S2 present  Respiratory: Diminished air entry bilaterally  Abdomen: Soft, obese, nontender, nondistended, bowel sounds present  Musculoskeletal: No pedal edema bilaterally  Skin: Normal  Psychiatry: Anxious   Data Reviewed: CBC: Recent Labs  Lab 02/26/18 0526 02/27/18 0527 02/28/18 0311 03/01/18 0336 03/02/18 0336  WBC 16.4* 14.9* 11.2* 11.1* 10.4  NEUTROABS 15.3* 13.7* 10.5* 10.6* 9.7*  HGB 9.5* 10.0* 9.5* 9.0* 9.5*  HCT 32.2* 33.8* 32.0* 29.8* 31.2*  MCV 97.3 96.6 96.4 93.7 93.7  PLT 416* 411* 360 355 211   Basic Metabolic Panel: Recent Labs  Lab 02/26/18 0526 02/27/18 0527 02/28/18 0311 03/01/18 0336 03/02/18 0336  NA 134* 136 139 135 133*  K 4.4 4.5 4.7 5.1 5.1  CL 94* 92* 91* 91* 93*  CO2 32 33* 39* 33* 29  GLUCOSE 93 96 96 99 100*  BUN 37* 29* 29* 43* 48*  CREATININE 1.15* 0.85 0.82 0.98 0.86  CALCIUM 8.4* 8.9 8.8* 9.1 9.1   GFR: Estimated Creatinine Clearance: 75.6 mL/min (by C-G formula based on SCr of 0.86 mg/dL). Liver Function Tests: Recent Labs  Lab 02/14/2018 1454 02/25/18 0515  AST 18 14*  ALT 24 22  ALKPHOS 95 84  BILITOT 0.6 0.8  PROT 7.8 7.1  ALBUMIN 3.0* 2.6*   No results for input(s): LIPASE, AMYLASE in the last 168 hours. No results for input(s): AMMONIA in the last 168 hours. Coagulation Profile: Recent Labs  Lab 02/26/18 0526  INR 1.16   Cardiac Enzymes: No results for input(s): CKTOTAL, CKMB, CKMBINDEX, TROPONINI in the last 168 hours. BNP (last 3 results) No results for input(s): PROBNP in the last 8760 hours. HbA1C: No results for input(s): HGBA1C in the last 72 hours. CBG: Recent Labs  Lab  02/27/18 1106  GLUCAP 139*   Lipid Profile: No results for input(s): CHOL, HDL, LDLCALC, TRIG, CHOLHDL, LDLDIRECT in the last 72 hours. Thyroid Function Tests: No results for input(s): TSH, T4TOTAL, FREET4, T3FREE, THYROIDAB in the last 72 hours. Anemia Panel: No results for input(s): VITAMINB12, FOLATE, FERRITIN, TIBC, IRON, RETICCTPCT in the last 72 hours. Urine analysis:    Component Value Date/Time   COLORURINE YELLOW 02/26/2018 1627   APPEARANCEUR CLEAR 02/26/2018 1627   LABSPEC 1.018 02/26/2018 1627   PHURINE 5.0 02/26/2018 1627   GLUCOSEU NEGATIVE 02/26/2018 1627   HGBUR NEGATIVE 02/26/2018 1627   BILIRUBINUR NEGATIVE 02/26/2018 1627   KETONESUR NEGATIVE 02/26/2018 1627   PROTEINUR NEGATIVE 02/26/2018 1627   NITRITE NEGATIVE 02/26/2018 1627   LEUKOCYTESUR NEGATIVE 02/26/2018 1627   Sepsis Labs: @LABRCNTIP (procalcitonin:4,lacticidven:4)  ) Recent Results (from the past 240 hour(s))  Blood Culture (routine x 2)     Status: None   Collection Time: 02/22/2018  2:47 PM  Result Value Ref Range Status   Specimen Description   Final    BLOOD RIGHT ANTECUBITAL Performed at [facility], [place] [address] [place], [place] [address]    Special Requests   Final    BOTTLES DRAWN AEROBIC AND ANAEROBIC Blood Culture  adequate volume Performed at [facility] [address], [place], [place] [address]    Culture   Final    NO GROWTH 5 DAYS Performed at [facility], [place] [address], [place], [place] [address]    Report Status 03/01/2018 FINAL  Final  Blood Culture (routine x 2)     Status: None   Collection Time: 02/17/2018  2:48 PM  Result Value Ref Range Status   Specimen Description   Final    BLOOD LEFT FOREARM Performed at [facility] [address], [place], [place] [address]    Special Requests   Final    BOTTLES DRAWN AEROBIC AND ANAEROBIC Blood Culture adequate volume Performed at [facility] [address], [place], [place] [address]    Culture   Final    NO GROWTH 5 DAYS Performed at [facility], [place] [address] [place], [place] [address]    Report Status 03/01/2018 FINAL  Final  MRSA PCR Screening     Status: None   Collection Time: 02/14/2018  4:50 PM  Result Value Ref Range Status   MRSA by PCR NEGATIVE NEGATIVE Final    Comment:        The GeneXpert MRSA Assay (FDA approved for NASAL specimens only), is one component of a comprehensive MRSA colonization surveillance program. It is not intended to diagnose MRSA infection nor to guide or monitor treatment for MRSA infections. Performed at [facility], [place] [address], [place], [place] [address]   Culture, sputum-assessment     Status: None   Collection Time: 02/26/18  4:40 AM  Result Value Ref Range Status   Specimen Description SPUTUM  Final   Special Requests NONE  Final   Sputum evaluation   Final    Sputum specimen not acceptable for testing.  Please recollect.   [place] 4010 02/26/18 A NAVARRO Performed at [facility] [address] [place], [place] [address]    Report Status 02/26/2018 FINAL  Final  Urine Culture     Status: None   Collection Time: 02/26/18  5:45 PM  Result Value Ref Range Status   Specimen Description   Final    URINE, RANDOM Performed at [facility] [address], [place], [place] [address]    Special Requests   Final    NONE Performed at [facility], [place] [address] [place], [place] [address]    Culture   Final    NO GROWTH Performed at [facility], [place] [address] [place], [place] [address]    Report Status 02/28/2018 FINAL  Final      Studies: No results found.  Scheduled Meds: . budesonide (PULMICORT) nebulizer solution  0.5 mg Nebulization BID  . dextromethorphan-guaiFENesin  1 tablet Oral BID  . levalbuterol  0.63 mg Nebulization Q4H  .  methylPREDNISolone (SOLU-MEDROL) injection  40 mg Intravenous Q12H  . pantoprazole  40 mg Oral BID    Continuous Infusions: . azithromycin Stopped (03/02/18 1359)  . cefTRIAXone (ROCEPHIN)  IV Stopped (03/02/18 1313)  . esmolol 25 mcg/kg/min (03/02/18 1403)  . niCARDipine 12.5 mg/hr (03/02/18 1455)     LOS: 6 days     Alma Friendly, MD [facility]  If 7PM-7AM, please contact night-coverage [email or web address] Password [facility] 03/02/2018, 3:41 PM

## 2018-03-02 NOTE — Progress Notes (Signed)
Daily Progress Note   Patient Name: Alisha Fisher       Date: 03/02/2018 DOB: 1946-09-29  Age: 72 y.o. MRN#: [id number] Attending Physician: Alma Friendly, MD Primary Care Physician: [name], MD Admit Date: 02/08/2018  Reason for Consultation/Follow-up: Establishing goals of care  Subjective: Somewhat lethargic off BIPAP. No family present.   Length of Stay: 6  Current Medications: Scheduled Meds:  . budesonide (PULMICORT) nebulizer solution  0.5 mg Nebulization BID  . dextromethorphan-guaiFENesin  1 tablet Oral BID  . levalbuterol  0.63 mg Nebulization Q4H  . methylPREDNISolone (SOLU-MEDROL) injection  40 mg Intravenous Q12H  . pantoprazole  40 mg Oral BID    Continuous Infusions: . azithromycin Stopped (03/01/18 1359)  . cefTRIAXone (ROCEPHIN)  IV Stopped (03/01/18 1314)  . esmolol 300 mcg/kg/min (03/02/18 0951)  . niCARDipine      PRN Meds: acetaminophen **OR** acetaminophen, clonazePAM, diphenhydrAMINE, diphenhydrAMINE, fluticasone, HYDROcodone-acetaminophen, levalbuterol, LORazepam, ondansetron **OR** ondansetron (ZOFRAN) IV  Physical Exam  Constitutional:  Frail appearing  Cardiovascular: Normal rate and regular rhythm.  Pulmonary/Chest:  Poor air movement without wheeze  Neurological: She is alert. She is disoriented.            Vital Signs: BP (!) 130/52   Pulse 70   Temp (!) 97.4 F (36.3 C) (Axillary)   Resp 19   Ht 5\' 7"  (1.702 m)   Wt 104.3 kg (230 lb)   SpO2 99%   BMI 36.02 kg/m  SpO2: SpO2: 99 % O2 Device: O2 Device: Bi-PAP O2 Flow Rate: O2 Flow Rate (L/min): 35 L/min  Intake/output summary:   Intake/Output Summary (Last 24 hours) at 03/02/2018 1012 Last data filed at 03/02/2018 0736 Gross per 24 hour  Intake 2132.99 ml  Output 525  ml  Net 1607.99 ml   LBM: Last BM Date: 02/25/18 Baseline Weight: Weight: 104.3 kg (230 lb) Most recent weight: Weight: 104.3 kg (230 lb)       Palliative Assessment/Data:      Patient Active Problem List   Diagnosis Date Noted  . Acute on chronic respiratory failure with hypercapnia ([place])   . 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 AKI (acute kidney injury) ([place]) 02/25/2018  . COPD with acute exacerbation ([place]) 02/23/2018  . Pleural effusion 02/14/2018  . Community acquired pneumonia 02/10/2018  .  Community acquired pneumonia of right lower lobe of lung ([place]) 02/11/2018  . Goals of care, counseling/discussion 02/12/2018  . Hilar mass   . Anxiety disorder 12/05/2017  . Iron deficiency anemia 11/07/2017  . OSA (obstructive sleep apnea) 10/17/2017  . 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 Constipation   . Chronic diastolic heart failure ([place]) 12/30/2016  . Squamous cell lung cancer, right ([place]) 12/25/2016  . Rash and nonspecific skin eruption   . Malignant neoplasm of bronchus of left upper lobe ([place]) 12/20/2016  . Chronic respiratory failure with hypoxia ([place]) 12/18/2016  . Essential hypertension 12/18/2016  . Anemia 12/18/2016  . Hypokalemia 12/16/2016  . Hypomagnesemia 12/16/2016  . Intraperitoneal abscess s/p perc drainage 12/14/2016 12/15/2016  . Acute appendicitis with perforation and peritoneal abscess 12/03/2016  . Obesity 05/25/2012  . Smoker 05/25/2012  . COPD (chronic obstructive pulmonary disease) ([place]) 05/25/2012    Palliative Care Assessment & Plan   Patient Profile: Acute on chronic resp failure with hypercarbia, R lung tumor, severe COPD, sleep apnea, possible PNA, now also found to have type A thoracic aortic dissection.   Assessment: Continues to require BIPAP support. Remains in ICU. No family present to discuss goals.   Recommendations/Plan:  Continue supportive care  Disposition unclear.    Code Status:    Code Status Orders    (From admission, onward)        Start     Ordered   02/28/18 1035  Do not attempt resuscitation (DNR)  Continuous    Question Answer Comment  In the event of cardiac or respiratory ARREST Do not call a "code blue"   In the event of cardiac or respiratory ARREST Do not perform Intubation, CPR, defibrillation or ACLS   In the event of cardiac or respiratory ARREST Use medication by any route, position, wound care, and other measures to relive pain and suffering. May use oxygen, suction and manual treatment of airway obstruction as needed for comfort.      02/28/18 1035    Code Status History    Date Active Date Inactive Code Status Order ID Comments User Context   02/28/2018 1651 02/28/2018 1035 Full Code [id number]  Edwin Dada, MD ED   08/15/2017 0106 08/18/2017 2031 Full Code [id number]  Phillips Grout, MD Inpatient   12/30/2016 1859 01/01/2017 1926 Full Code [id number]  Benito Mccreedy, MD Inpatient   12/03/2016 2125 12/26/2016 1900 Full Code [id number]  Johnathan Hausen, MD ED    Advance Directive Documentation     Most Recent Value  Type of Advance Directive  Healthcare Power of Attorney, Living will  Pre-existing out of facility DNR order (yellow form or pink MOST form)  -  "MOST" Form in Place?  -       Thank you for allowing the Palliative Medicine Team to assist in the care of this patient.   Time In: 1000 Time Out: 1015 Total Time 15 minutes Prolonged Time Billed  NO      Greater than 50%  of this time was spent counseling and coordinating care related to the above assessment and plan.  Irean Hong, NP  Please contact Palliative Medicine Team phone at [phone] for questions and concerns.

## 2018-03-02 NOTE — Progress Notes (Addendum)
Pt. SBP From 2300 to 0015 above 120 but HR around 57-63 since 2000 (sustaining under 60 from 2300-0015). Notified e-link for guidance, ordered to titrate down for HR.

## 2018-03-02 NOTE — Progress Notes (Signed)
[facility] Progress Note Patient Name: Aitana Burry DOB: January 29, 1946 MRN: [id number]   Date of Service  03/02/2018  HPI/Events of Note  Notified of need for DVT prophylaxis. History of Type A Thoracic Aortic Dissection (4.7 cm). Not felt to be a surgical candidate.   eICU Interventions  Will order: 1. Place SCD's.     Intervention Category Intermediate Interventions: Best-practice therapies (e.g. DVT, beta blocker, etc.)  Sommer,Steven Eugene 03/02/2018, 10:21 PM

## 2018-03-02 NOTE — Progress Notes (Signed)
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  She had a bronchoscopy in March 2019 which confirmed suspicion of recurrent squamous cell carcinoma and then she started treatment with immunotherapy (Pembrolizumab) in May 2019.  She has received 1 treatment.  She presented to [facility] on Feb 24, 2018 complaining of 4 to 5 days of chest congestion and lethargy and shortness of breath.  A CT scan performed after admission showed persistent right upper lobe radiation fibrosis, and what appears to be significant growth of the right lower lobe mass with pleural involvement versus consolidation and pneumonia.  She was admitted by the hospitalist service and has been receiving treatment for a COPD exacerbation, healthcare associated pneumonia, and has received some diuretic therapy.  On 5/30 she was transferred to the intensive care unit because of increasing work of breathing and lethargy.  She was noted to be hypercarbic.  She was started on noninvasive mechanical ventilation after arrival.  Shortly after starting on that treatment her anxiety dramatically worsened and so she was given Ativan.  Pulmonary and critical care medicine was consulted because of the patient's worsening respiratory status and a continued FULL CODE STATUS.  Since arrival to the intensive care unit she has become less anxious and is more compliant with NIMV.  ->has very poor functional status and limited activity status at baseline for at least the last year leading up to this.     SIGNIFICANT EVENTS: 5/27>>Admssion to [facility] 5/30>> CT reveals Type A aortic  dissection 5/31 - On Bipap, 40%FiO2 Repeat CT chest w/ angiogram ordered on 5/30 by primary service incidentally found: suspected type A thoracic aortic dissection extending down to aortic root.  Esmolol gtt infusing; not a surgical candidate family made aware not likely to survive  Remains lethargic     SUBJECTIVE/OVERNIGHT/INTERVAL HX 6/1 - called from pharmacy runing out of esmolol and want alternative. Cardene under consideration. Med review also shows brovana and xopenex. RN says patient wants to eat but is on bipap continuous but might not need it in day  VITAL SIGNS: BP (!) 130/52   Pulse 70   Temp (!) 97.4 F (36.3 C) (Axillary)   Resp 19   Ht 5\' 7"  (1.702 m)   Wt 104.3 kg (230 lb)   SpO2 99%   BMI 36.02 kg/m   HEMODYNAMICS:    VENTILATOR SETTINGS: FiO2 (%):  [35 %] 35 %  INTAKE / OUTPUT: I/O last 3 completed shifts: In: 2921.1 [I.V.:2571.1; IV Piggyback:350] Out: 410 [Urine:410]  PHYSICAL EXAMINATION:  General Appearance:    Looks chronic ill OBESE - +. Calm off bipap  Head:    Normocephalic, without obvious abnormality, atraumatic  Eyes:    PERRL - yes, conjunctiva/corneas - clar      Ears:    Normal external ear canals, both ears  Nose:   NG tube - no but has   Throat:  ETT TUBE - no , OG tube - no  Neck:   Supple,  No enlargement/tenderness/nodules     Lungs:     Clear to auscultation bilaterally,   Chest wall:    No  deformity  Heart:    S1 and S2 normal, no murmur, CVP - no.  Pressors - no  Abdomen:     Soft, no masses, no organomegaly  Genitalia:    Not done  Rectal:   not done  Extremities:   Extremities- intact     Skin:   Intact in exposed areas .     Neurologic:   Sedation - none -> RASS - 0 . Moves all 4s - yes. CAM-ICU - Not tested . Orientation - not testd but answering questions slowly. Is HOH +      PULMONARY Recent Labs  Lab 02/07/2018 1454 02/05/2018 1503 02/27/18 1100  PHART  --   --  7.244*  PCO2ART  --   --  88.7*  PO2ART   --   --  73.2*  HCO3  --  37.2* 37.0*  TCO2 36*  --   --   O2SAT  --  83.6 92.8    CBC Recent Labs  Lab 02/28/18 0311 03/01/18 0336 03/02/18 0336  HGB 9.5* 9.0* 9.5*  HCT 32.0* 29.8* 31.2*  WBC 11.2* 11.1* 10.4  PLT 360 355 343    COAGULATION Recent Labs  Lab 02/26/18 0526  INR 1.16    CARDIAC  No results for input(s): TROPONINI in the last 168 hours. No results for input(s): PROBNP in the last 168 hours.   CHEMISTRY Recent Labs  Lab 02/26/18 0526 02/27/18 0527 02/28/18 0311 03/01/18 0336 03/02/18 0336  NA 134* 136 139 135 133*  K 4.4 4.5 4.7 5.1 5.1  CL 94* 92* 91* 91* 93*  CO2 32 33* 39* 33* 29  GLUCOSE 93 96 96 99 100*  BUN 37* 29* 29* 43* 48*  CREATININE 1.15* 0.85 0.82 0.98 0.86  CALCIUM 8.4* 8.9 8.8* 9.1 9.1   Estimated Creatinine Clearance: 75.6 mL/min (by C-G formula based on SCr of 0.86 mg/dL).   LIVER Recent Labs  Lab 02/13/2018 1454 02/25/18 0515 02/26/18 0526  AST 18 14*  --   ALT 24 22  --   ALKPHOS 95 84  --   BILITOT 0.6 0.8  --   PROT 7.8 7.1  --   ALBUMIN 3.0* 2.6*  --   INR  --   --  1.16     INFECTIOUS Recent Labs  Lab 02/10/2018 1454 01/31/2018 1925 02/25/18 0515 02/26/18 0526  LATICACIDVEN 1.37  --   --   --   PROCALCITON  --  0.53 0.61 0.34     ENDOCRINE CBG (last 3)  Recent Labs    02/27/18 1106  GLUCAP 139*         IMAGING x48h  - image(s) personally visualized  -   highlighted in bold Dg Chest Port 1 View  Result Date: 03/01/2018 CLINICAL DATA:  Acute respiratory failure. History of hypertension, lung cancer, COPD, PE, former smoker. EXAM: PORTABLE CHEST 1 VIEW COMPARISON:  Chest x-rays dated 02/27/2018 and 02/23/2018. FINDINGS: Cardiomegaly is stable. Overall cardiomediastinal silhouette is stable. Atherosclerotic changes again noted at the aortic arch. Stable opacity at the RIGHT lung base. New platelike opacity within the LEFT perihilar lung, pneumonia versus atelectasis. No pneumothorax seen.  IMPRESSION: 1. Stable cardiomegaly. 2. Persistent opacity at the RIGHT lung base, compatible with the mass/consolidation demonstrated on recent chest CT of 02/27/2018. 3. New platelike opacity within the LEFT perihilar lung, pneumonia versus atelectasis, less likely asymmetric edema. Electronically Signed   By: Franki Cabot M.D.   On: 03/01/2018 07:37  ASSESSMENT / PLAN:  PULMONARY A: Acute on Chronic Respiratory Failure with Hypercarbia Acute Pulmonary edema>>improving Right Lung tumor Possible right Lung Pleural Effusion (not clear from CT imaging) Severe COPD ? CAP Morbid obesity with chronic hypercarbia, likely obesity hypoventilation syndrome Hx: Sleep apnea, COPD, Lung cancer   - 03/02/2018 - ? 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 ECO 5/31 - ef 55% . Elevated LVEDP +, PASP 43  Plan:  ICU monitoring Continue Esmolol gtt, Titrate to keep SBP 100-120 mmHg but add cardene and wean esmolol off DC brovana     Code Status Orders  (From admission, onward)        Start     Ordered   02/28/18 1035  Do not attempt resuscitation (DNR)  Continuous    Question Answer Comment  In the event of cardiac or respiratory ARREST Do not call a "code blue"   In the event of cardiac or respiratory ARREST Do not perform Intubation, CPR, defibrillation or ACLS   In the event of cardiac or respiratory ARREST Use medication by any route, position, wound care, and other  measures to relive pain and suffering. May use oxygen, suction and manual treatment of airway obstruction as needed for comfort.      02/28/18 1035    Code Status History    Date Active Date Inactive Code Status Order ID Comments User Context   02/13/2018 1651 02/28/2018 1035 Full Code [id number]  Edwin Dada, MD ED   08/15/2017 0106 08/18/2017 2031 Full Code [id number]  Phillips Grout, MD Inpatient   12/30/2016 1859 01/01/2017 1926 Full Code [id number]  Benito Mccreedy, MD Inpatient   12/03/2016 2125 12/26/2016 1900 Full Code [id number]  Johnathan Hausen, MD ED    Advance Directive Documentation     Most Recent Value  Type of Advance Directive  Healthcare Power of Attorney, Living will  Pre-existing out of facility DNR order (yellow form or pink MOST form)  -  "MOST" Form in Place?  -       CCM will round   Dr. Brand Males, M.D., [facility].C.P Pulmonary and Critical Care Medicine Staff Physician, [place] Director - Interstitial Lung Disease  Program  Pulmonary [facility] [place], [place], [address]  Pager: [phone], If no answer or between  15:00h - 7:00h: call [phone] Telephone: [phone]

## 2018-03-03 LAB — CBC WITH DIFFERENTIAL/PLATELET
BASOS PCT: 0 %
Basophils Absolute: 0 10*3/uL (ref 0.0–0.1)
Eosinophils Absolute: 0 10*3/uL (ref 0.0–0.7)
Eosinophils Relative: 0 %
HEMATOCRIT: 28.4 % — AB (ref 36.0–46.0)
Hemoglobin: 8.8 g/dL — ABNORMAL LOW (ref 12.0–15.0)
LYMPHS ABS: 0.4 10*3/uL — AB (ref 0.7–4.0)
LYMPHS PCT: 4 %
MCH: 28.8 pg (ref 26.0–34.0)
MCHC: 31 g/dL (ref 30.0–36.0)
MCV: 92.8 fL (ref 78.0–100.0)
MONO ABS: 0.1 10*3/uL (ref 0.1–1.0)
MONOS PCT: 1 %
NEUTROS ABS: 9.7 10*3/uL — AB (ref 1.7–7.7)
Neutrophils Relative %: 95 %
Platelets: 343 10*3/uL (ref 150–400)
RBC: 3.06 MIL/uL — ABNORMAL LOW (ref 3.87–5.11)
RDW: 15.2 % (ref 11.5–15.5)
WBC: 10.2 10*3/uL (ref 4.0–10.5)

## 2018-03-03 LAB — BASIC METABOLIC PANEL
Anion gap: 10 (ref 5–15)
BUN: 41 mg/dL — ABNORMAL HIGH (ref 6–20)
CALCIUM: 8.5 mg/dL — AB (ref 8.9–10.3)
CHLORIDE: 97 mmol/L — AB (ref 101–111)
CO2: 26 mmol/L (ref 22–32)
CREATININE: 0.65 mg/dL (ref 0.44–1.00)
GFR calc non Af Amer: >60 mL/min (ref >60)
Glucose, Bld: 111 mg/dL — ABNORMAL HIGH (ref 65–99)
Potassium: 4.6 mmol/L (ref 3.5–5.1)
Sodium: 133 mmol/L — ABNORMAL LOW (ref 135–145)

## 2018-03-03 MED ORDER — AMLODIPINE BESYLATE 10 MG PO TABS
10.0000 mg | ORAL_TABLET | Freq: Every day | ORAL | Status: DC
  Administered 2018-03-03 – 2018-03-07 (×5): 10 mg via ORAL
  Filled 2018-03-03 (×5): qty 1

## 2018-03-03 MED ORDER — METOPROLOL SUCCINATE ER 25 MG PO TB24
100.0000 mg | ORAL_TABLET | Freq: Every day | ORAL | Status: DC
  Administered 2018-03-03 – 2018-03-04 (×2): 100 mg via ORAL
  Filled 2018-03-03 (×2): qty 4

## 2018-03-03 MED ORDER — METHYLPREDNISOLONE SODIUM SUCC 40 MG IJ SOLR
40.0000 mg | INTRAMUSCULAR | Status: DC
  Administered 2018-03-03: 40 mg via INTRAVENOUS
  Filled 2018-03-03: qty 1

## 2018-03-03 MED ORDER — FUROSEMIDE 10 MG/ML IJ SOLN
40.0000 mg | Freq: Once | INTRAMUSCULAR | Status: AC
  Administered 2018-03-03: 40 mg via INTRAVENOUS
  Filled 2018-03-03: qty 4

## 2018-03-03 MED ORDER — METOPROLOL TARTRATE 25 MG PO TABS: 100.0000 mg | ORAL_TABLET | Freq: Every day | ORAL | Status: DC

## 2018-03-03 NOTE — Progress Notes (Signed)
Ms. Whetsel is about the same as she was my saw her on Friday.  She has the BiPAP on.  She is on antibiotics with Zithromax and Rocephin.  She is on steroids for her underlying COPD.  She is on a esmolol drip for the thoracic aortic aneurysm.  She is not a operative candidate for this.  Her blood counts today show her white cell count to be 10.2.  Hemoglobin 8.8.  Her platelet count is 343,000.  Her electrolytes look okay.  Her blood sugar is 111.  Given her underlying COPD, and the fact that she probably needs steroids, I am not sure we are going to be able to use immunotherapy in the future.  She still has a poor performance status.  This also is a consideration as to how we can treat her.  She is eating marginally.  She does have some pain.  Her physical exam is pretty much unchanged from when I last saw her.  I appreciate everybody's help with Ms. Scheck.  I know that this is a difficult case.   Lattie Haw, MD  Fara Olden 3:16

## 2018-03-03 NOTE — Progress Notes (Signed)
Daily Progress Note   Patient Name: Alisha Fisher       Date: 03/03/2018 DOB: 03/04/1946  Age: 72 y.o. MRN#: [id number] Attending Physician: Alma Friendly, MD Primary Care Physician: [name], MD Admit Date: 02/02/2018  Reason for Consultation/Follow-up: Establishing goals of care and Psychosocial/spiritual support  Subjective: Off bipap, mental status is more clear, on high flow. No family at bedside.  Length of Stay: 7  Current Medications: Scheduled Meds:  . amLODipine  10 mg Oral Daily  . budesonide (PULMICORT) nebulizer solution  0.5 mg Nebulization BID  . dextromethorphan-guaiFENesin  1 tablet Oral BID  . levalbuterol  0.63 mg Nebulization Q4H  . methylPREDNISolone (SOLU-MEDROL) injection  40 mg Intravenous Q24H  . metoprolol succinate  100 mg Oral Daily  . pantoprazole  40 mg Oral BID    Continuous Infusions: . esmolol 125 mcg/kg/min (03/03/18 1746)  . niCARDipine 15 mg/hr (03/03/18 1850)    PRN Meds: acetaminophen **OR** acetaminophen, clonazePAM, diphenhydrAMINE, diphenhydrAMINE, fluticasone, HYDROcodone-acetaminophen, levalbuterol, LORazepam, ondansetron **OR** ondansetron (ZOFRAN) IV  Physical Exam          Vital Signs: BP (!) 139/49   Pulse 65   Temp 98.6 F (37 C) (Oral)   Resp 19   Ht 5\' 7"  (1.702 m)   Wt 104.3 kg (230 lb)   SpO2 100%   BMI 36.02 kg/m  SpO2: SpO2: 100 % O2 Device: O2 Device: High Flow Nasal Cannula O2 Flow Rate: O2 Flow Rate (L/min): 4 L/min  Intake/output summary:   Intake/Output Summary (Last 24 hours) at 03/03/2018 1918 Last data filed at 03/03/2018 1756 Gross per 24 hour  Intake 3152.79 ml  Output 2900 ml  Net 252.79 ml   LBM: Last BM Date: 02/25/18 Baseline Weight: Weight: 104.3 kg (230 lb) Most recent weight:  Weight: 104.3 kg (230 lb)       Palliative Assessment/Data:      Patient Active Problem List   Diagnosis Date Noted  . Acute on chronic respiratory failure with hypercapnia ([place])   . 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 AKI (acute kidney injury) ([place]) 02/25/2018  . COPD with acute exacerbation ([place]) 02/19/2018  . Pleural effusion 02/07/2018  . Community acquired pneumonia 02/26/2018  . Community acquired pneumonia of right lower lobe of lung ([place]) 01/29/2018  . Goals  of care, counseling/discussion 02/12/2018  . Hilar mass   . Anxiety disorder 12/05/2017  . Iron deficiency anemia 11/07/2017  . OSA (obstructive sleep apnea) 10/17/2017  . Radiation pneumonitis ([place]) 10/17/2017  . Constipation   . Chronic diastolic heart failure ([place]) 12/30/2016  . Squamous cell lung cancer, right ([place]) 12/25/2016  . Rash and nonspecific skin eruption   . 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 Chronic respiratory failure with hypoxia ([place]) 12/18/2016  . Essential hypertension 12/18/2016  . Anemia 12/18/2016  . Hypokalemia 12/16/2016  . Hypomagnesemia 12/16/2016  . Intraperitoneal abscess s/p perc drainage 12/14/2016 12/15/2016  . Acute appendicitis with perforation and peritoneal abscess 12/03/2016  . Obesity 05/25/2012  . Smoker 05/25/2012  . COPD (chronic obstructive pulmonary disease) ([place]) 05/25/2012    Palliative Care Assessment & Plan   Patient Profile: 72 yo with NSCLC and COPD, admitted in respiratory failure. Followed by Dr. Marin Olp and Dr. Lamonte Sakai outpatient prior to admission. Declining functional status.  Assessment: Resp. Failure, high flow: air huger and anxiety PRNs helpful Poor Prognosis  Recommendations/Plan:  Palliative available for goals of care discussion or care planning, SM- will need to see how she does over the next day or two- for now moving in right direction off bipap and clearing mental status.  Goals of Care and Additional  Recommendations:  Limitations on Scope of Treatment: Full Scope Treatment  Code Status:    Code Status Orders  (From admission, onward)        Start     Ordered   02/28/18 1035  Do not attempt resuscitation (DNR)  Continuous    Question Answer Comment  In the event of cardiac or respiratory ARREST Do not call a "code blue"   In the event of cardiac or respiratory ARREST Do not perform Intubation, CPR, defibrillation or ACLS   In the event of cardiac or respiratory ARREST Use medication by any route, position, wound care, and other measures to relive pain and suffering. May use oxygen, suction and manual treatment of airway obstruction as needed for comfort.      02/28/18 1035    Code Status History    Date Active Date Inactive Code Status Order ID Comments User Context   02/11/2018 1651 02/28/2018 1035 Full Code [id number]  Edwin Dada, MD ED   08/15/2017 0106 08/18/2017 2031 Full Code [id number]  Phillips Grout, MD Inpatient   12/30/2016 1859 01/01/2017 1926 Full Code [id number]  Benito Mccreedy, MD Inpatient   12/03/2016 2125 12/26/2016 1900 Full Code [id number]  Johnathan Hausen, MD ED    Advance Directive Documentation     Most Recent Value  Type of Advance Directive  Healthcare Power of Attorney, Living will  Pre-existing out of facility DNR order (yellow form or pink MOST form)  -  "MOST" Form in Place?  -       Prognosis:   Unable to determine  Discharge Planning:  To Be Determined  Care plan was discussed with RN   Thank you for allowing the Palliative Medicine Team to assist in the care of this patient. Time: 15 min     Greater than 50%  of this time was spent counseling and coordinating care related to the above assessment and plan.  Lane Hacker, DO  Please contact Palliative Medicine Team phone at [phone] for questions and concerns.

## 2018-03-03 NOTE — Progress Notes (Signed)
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  In the ED, patient was found to be hypoxic, febrile and tachycardic.  Patient admitted for further management  Today, patient seen in ICU on bipap, looked more awake today. No acute events overnight, no worsening respiratory status.  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[place])   Acute on chronic respiratory failure with hypercapnia (HCC)   Acute on chronic hypoxic and hypercapnic respiratory failure/COPD/obesity hypoventilation syndrome/Acute pulmonary edema Currently on bipap ABG with improved hypercarbia PCCM consulted, appreciate recs Continue bipap prn during the day and continuous at night Continue duonebs/inhaler, tapered steroids, lasix prn Continue to monitor in ICU  Type A Thoracic Aortic dissection CT showed type A thoracic aortic dissection, 4.7cm Not a surgical candidate, strict BP/HR control ECHO showed: EF of 55-60%, PA peak pressure of 50mmhg Pharmacy reported shortage of  esmolol gtt as pt is requiring a lot Started on PO metoprolol, norvasc, with plans to wean off esmolol and cardene to keep SBP 100-120 mmhg Close monitoring  ??Post-obstructive pneumonia/radiation fibrosis Afebrile, with resolved leukocytosis Procalcitonin trending down to 0.34 Urine strep pneumo, Legionella both negative BC x2, NGTD Chest x-ray: Extensive peribronchial airspace consolidation versus lymphangitic spread of tumor, repeat with now worsening vascular congestion CT chest: Continued presence of large right lower lobe mass with pleural spread is noted consistent with history of lung cancer. 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 is well S/P IV ceftriaxone and azithromycin X 7 days  AKI Resolved Daily BMP  Iron deficiency/anemia of chronic disease Baseline hemoglobin between 10-11 Status post IV iron on 02/26/2018 Daily CBC  Squamous cell carcinoma of the right lung Oncology following, on Keytruda (oncology considering switching to chemo) Plan for Port-A-Cath placement once stable  Acute on chronic diastolic heart failure Echo showed EF of 65 to 53%, grade 2 diastolic dysfunction Lasix prn  Hypertension Stable, controlled Started on PO metoprolol, norvasc, with plans to wean off esmolol and cardene to keep SBP 100-120 mmhg  Obstructive sleep apnea On bipap scheduled at night  GERD Continue Protonix  Deconditioning/GOC discussion Palliative consulted due to very poor clinical condition, poor/guarded prognosis    Code Status: DNR  Family Communication: None at bedside  Disposition Plan: Once significant improvement  Consultants:  Oncology  PCCM  Palliative   Procedures:  None  Antimicrobials:  S/p Ceftriaxone and Azithromycin  DVT prophylaxis: SCDs   Objective: Vitals:   03/03/18 1500 03/03/18 1527 03/03/18 1600 03/03/18 1700  BP: (!) 121/42  (!) 149/52 (!) 154/50  Pulse: (!) 51  61 73  Resp: 16  19 17  Temp:   98.6 F  (37 C)   TempSrc:   Oral   SpO2: 100% 97% 100% 100%  Weight:      Height:        Intake/Output Summary (Last 24 hours) at 03/03/2018 1752 Last data filed at 03/03/2018 1700 Gross per 24 hour  Intake 3600.18 ml  Output 2000 ml  Net 1600.18 ml   Filed Weights   02/10/2018 1437  Weight: 104.3 kg (230 lb)    Exam:   General: NAD, on bipap  Cardiovascular: S1, S2 present  Respiratory: Diminished air entry bilaterally  Abdomen: Soft, obese, nontender, nondistended, bowel sounds present  Musculoskeletal: No pedal edema bilaterally  Skin: Normal  Psychiatry: Anxious   Data Reviewed: CBC: Recent Labs  Lab 02/27/18 0527 02/28/18 0311 03/01/18 0336 03/02/18 0336 03/03/18 0344  WBC 14.9* 11.2* 11.1* 10.4 10.2  NEUTROABS 13.7* 10.5* 10.6* 9.7* 9.7*  HGB 10.0* 9.5* 9.0* 9.5* 8.8*  HCT 33.8* 32.0* 29.8* 31.2* 28.4*  MCV 96.6 96.4 93.7 93.7 92.8  PLT 411* 360 355 343 500   Basic Metabolic Panel: Recent Labs  Lab 02/27/18 0527 02/28/18 0311 03/01/18 0336 03/02/18 0336 03/03/18 0344  NA 136 139 135 133* 133*  K 4.5 4.7 5.1 5.1 4.6  CL 92* 91* 91* 93* 97*  CO2 33* 39* 33* 29 26  GLUCOSE 96 96 99 100* 111*  BUN 29* 29* 43* 48* 41*  CREATININE 0.85 0.82 0.98 0.86 0.65  CALCIUM 8.9 8.8* 9.1 9.1 8.5*   GFR: Estimated Creatinine Clearance: 81.3 mL/min (by C-G formula based on SCr of 0.65 mg/dL). Liver Function Tests: Recent Labs  Lab 02/25/18 0515  AST 14*  ALT 22  ALKPHOS 84  BILITOT 0.8  PROT 7.1  ALBUMIN 2.6*   No results for input(s): LIPASE, AMYLASE in the last 168 hours. No results for input(s): AMMONIA in the last 168 hours. Coagulation Profile: Recent Labs  Lab 02/26/18 0526  INR 1.16   Cardiac Enzymes: No results for input(s): CKTOTAL, CKMB, CKMBINDEX, TROPONINI in the last 168 hours. BNP (last 3 results) No results for input(s): PROBNP in the last 8760 hours. HbA1C: No results for input(s): HGBA1C in the last 72 hours. CBG: Recent Labs    Lab 02/27/18 1106  GLUCAP 139*   Lipid Profile: No results for input(s): CHOL, HDL, LDLCALC, TRIG, CHOLHDL, LDLDIRECT in the last 72 hours. Thyroid Function Tests: No results for input(s): TSH, T4TOTAL, FREET4, T3FREE, THYROIDAB in the last 72 hours. Anemia Panel: No results for input(s): VITAMINB12, FOLATE, FERRITIN, TIBC, IRON, RETICCTPCT in the last 72 hours. Urine analysis:    Component Value Date/Time   COLORURINE YELLOW 02/26/2018 1627   APPEARANCEUR CLEAR 02/26/2018 1627   LABSPEC 1.018 02/26/2018 1627   PHURINE 5.0 02/26/2018 1627   GLUCOSEU NEGATIVE 02/26/2018 1627   HGBUR NEGATIVE 02/26/2018 1627   BILIRUBINUR NEGATIVE 02/26/2018 1627   KETONESUR NEGATIVE 02/26/2018 1627   PROTEINUR NEGATIVE 02/26/2018 1627   NITRITE NEGATIVE 02/26/2018 1627   LEUKOCYTESUR NEGATIVE 02/26/2018 1627   Sepsis Labs: @LABRCNTIP (procalcitonin:4,lacticidven:4)  ) Recent Results (from the past 240 hour(s))  Blood Culture (routine x 2)     Status: None   Collection Time: 02/04/2018  2:47 PM  Result Value Ref Range Status   Specimen Description   Final    BLOOD RIGHT ANTECUBITAL Performed at [facility], [place] [address] [place], [place] [address]    Special Requests   Final    BOTTLES DRAWN AEROBIC  AND ANAEROBIC Blood Culture adequate volume Performed at [facility] [address], [place], [place] [address]    Culture   Final    NO GROWTH 5 DAYS Performed at [facility], [place] [address] [place], [place] [address]    Report Status 03/01/2018 FINAL  Final  Blood Culture (routine x 2)     Status: None   Collection Time: 02/26/2018  2:48 PM  Result Value Ref Range Status   Specimen Description   Final    BLOOD LEFT FOREARM Performed at [facility] [address] [place], [place] [address]    Special Requests   Final    BOTTLES DRAWN AEROBIC AND ANAEROBIC Blood Culture adequate volume Performed at [facility] [address] [place], [place] [address]    Culture   Final    NO GROWTH 5 DAYS Performed at [facility], [place] [address] [place], [place] [address]    Report Status 03/01/2018 FINAL  Final  MRSA PCR Screening     Status: None   Collection Time: 02/03/2018  4:50 PM  Result Value Ref Range Status   MRSA by PCR NEGATIVE NEGATIVE Final    Comment:        The GeneXpert MRSA Assay (FDA approved for NASAL specimens only), is one component of a comprehensive MRSA colonization surveillance program. It is not intended to diagnose MRSA infection nor to guide or monitor treatment for MRSA infections. Performed at [facility], [place] [address], [place], [place] [address]   Culture, sputum-assessment     Status: None   Collection Time: 02/26/18  4:40 AM  Result Value Ref Range Status   Specimen Description SPUTUM  Final   Special Requests NONE  Final   Sputum evaluation   Final    Sputum specimen not acceptable for testing.  Please recollect.   [place] 1443 02/26/18 A NAVARRO Performed at [facility] [address], [place], [place] [address]    Report Status 02/26/2018 FINAL  Final  Urine Culture     Status: None   Collection Time: 02/26/18  5:45 PM  Result Value Ref Range Status   Specimen Description   Final    URINE, RANDOM Performed at [facility] [address] [place], [place] [address]    Special Requests   Final    NONE Performed at [facility], [place] [address] [place], [place] [address]    Culture   Final    NO GROWTH Performed at [facility], [place] [address], [place],  [address]    Report Status 02/28/2018 FINAL  Final      Studies: No results found.  Scheduled Meds: . amLODipine  10 mg Oral Daily  . budesonide (PULMICORT) nebulizer solution  0.5 mg Nebulization BID  . dextromethorphan-guaiFENesin  1 tablet Oral BID  .  levalbuterol  0.63 mg Nebulization Q4H  . methylPREDNISolone (SOLU-MEDROL) injection  40 mg Intravenous Q24H  . metoprolol succinate  100 mg Oral Daily  . pantoprazole  40 mg Oral BID    Continuous Infusions: . esmolol 125 mcg/kg/min (03/03/18 1746)  . niCARDipine 15 mg/hr (03/03/18 1704)     LOS: 7 days     Alma Friendly, MD [facility]  If 7PM-7AM, please contact night-coverage [email or web address] Password [email or web address] 03/03/2018, 5:52 PM

## 2018-03-03 NOTE — Progress Notes (Addendum)
PULMONARY / CRITICAL CARE MEDICINE   Name: Alisha Fisher MRN: [id number] DOB: [birth year]    ADMISSION DATE:  02/25/2018 CONSULTATION DATE:  02/27/2018  REFERRING MD:  Horris Latino  CHIEF COMPLAINT:  Dsypnea  brief This is a 71 year old female with a past medical history significant for severe COPD who has squamous cell carcinoma of the lung which recurred in 2019 when seen on a February 2019 PET scan.  She had a bronchoscopy in March 2019 which confirmed suspicion of recurrent squamous cell carcinoma and then she started treatment with immunotherapy (Pembrolizumab) in May 2019.  She has received 1 treatment.  She presented to [facility] on Feb 24, 2018 complaining of 4 to 5 days of chest congestion and lethargy and shortness of breath.  A CT scan performed after admission showed persistent right upper lobe radiation fibrosis, and what appears to be significant growth of the right lower lobe mass with pleural involvement versus consolidation and pneumonia.  She was admitted by the hospitalist service and has been receiving treatment for a COPD exacerbation, healthcare associated pneumonia, and has received some diuretic therapy.  On 5/30 she was transferred to the intensive care unit because of increasing work of breathing and lethargy.  She was noted to be hypercarbic.  She was started on noninvasive mechanical ventilation after arrival.  Shortly after starting on that treatment her anxiety dramatically worsened and so she was given Ativan.  Pulmonary and critical care medicine was consulted because of the patient's worsening respiratory status and a continued FULL CODE STATUS.  Since arrival to the intensive care unit she has become less anxious and is more compliant with NIMV. On 5/30, CT Chest revealed a type A Thoracic aortic dissection of which she is not a surgical candidate. Pt and family have since decided on DNR status.  ->has very poor functional status and limited activity status at  baseline for at least the last year leading up to this.     SIGNIFICANT EVENTS: 5/27>>Admssion to [facility] 5/30>> CT reveals Type A aortic dissection 5/31 - On Bipap, 40%FiO2 Repeat CT chest w/ angiogram ordered on 5/30 by primary service incidentally found: suspected type A thoracic aortic dissection extending down to aortic root.  Esmolol gtt infusing; not a surgical candidate family made aware not likely to survive  Remains lethargic     SUBJECTIVE/OVERNIGHT/INTERVAL HX Mildly anxious, complains of generalized pain Wants to eat, on Bipap Esmolol & Cardene gtt's infusing  VITAL SIGNS: BP (!) 117/40   Pulse 66   Temp (!) 97.5 F (36.4 C) (Axillary)   Resp 17   Ht 5\' 7"  (1.702 m)   Wt 230 lb (104.3 kg)   SpO2 93%   BMI 36.02 kg/m   HEMODYNAMICS:    VENTILATOR SETTINGS: FiO2 (%):  [35 %] 35 %  INTAKE / OUTPUT: I/O last 3 completed shifts: In: 5210.1 [P.O.:30; I.V.:4480.1; IV Piggyback:700] Out: 1225 [Urine:1225]  PHYSICAL EXAMINATION: GENERAL: Acute on chronically ill-appearing, laying in bed, on bipap, mildly anxious, in no acute distress HEENT: Atraumatic, normocephalic, neck supple, pupils PERRL CARDIAC: RRR, s1s2 noted, no M/R/G LUNGS: Bipap assisted, Crackles RLL, clear throughout all other lobes, ABDOMEN: Obese, soft, non-tender, BS + x4 EXTREMITIES: No deformities, 2+ bilateral UE SKIN: no obvious rashes, lesions, or ulcerations NEURO: Awake, alert, follows commands, mildly anxious                       PULMONARY Recent Labs  Lab 02/02/2018 1454 02/18/2018 1503 02/27/18  1100 03/02/18 1035  PHART  --   --  7.244* 7.341*  PCO2ART  --   --  88.7* 60.2*  PO2ART  --   --  73.2* 78.1*  HCO3  --  37.2* 37.0* 31.7*  TCO2 36*  --   --   --   O2SAT  --  83.6 92.8 94.2    CBC Recent Labs  Lab 03/01/18 0336 03/02/18 0336 03/03/18 0344  HGB 9.0* 9.5* 8.8*  HCT 29.8* 31.2* 28.4*  WBC 11.1* 10.4 10.2  PLT 355 343 343    COAGULATION Recent  Labs  Lab 02/26/18 0526  INR 1.16    CARDIAC  No results for input(s): TROPONINI in the last 168 hours. No results for input(s): PROBNP in the last 168 hours.   CHEMISTRY Recent Labs  Lab 02/27/18 0527 02/28/18 0311 03/01/18 0336 03/02/18 0336 03/03/18 0344  NA 136 139 135 133* 133*  K 4.5 4.7 5.1 5.1 4.6  CL 92* 91* 91* 93* 97*  CO2 33* 39* 33* 29 26  GLUCOSE 96 96 99 100* 111*  BUN 29* 29* 43* 48* 41*  CREATININE 0.85 0.82 0.98 0.86 0.65  CALCIUM 8.9 8.8* 9.1 9.1 8.5*   Estimated Creatinine Clearance: 81.3 mL/min (by C-G formula based on SCr of 0.65 mg/dL).   LIVER Recent Labs  Lab 02/19/2018 1454 02/25/18 0515 02/26/18 0526  AST 18 14*  --   ALT 24 22  --   ALKPHOS 95 84  --   BILITOT 0.6 0.8  --   PROT 7.8 7.1  --   ALBUMIN 3.0* 2.6*  --   INR  --   --  1.16     INFECTIOUS Recent Labs  Lab 01/29/2018 1454 02/07/2018 1925 02/25/18 0515 02/26/18 0526  LATICACIDVEN 1.37  --   --   --   PROCALCITON  --  0.53 0.61 0.34     ENDOCRINE CBG (last 3)  No results for input(s): GLUCAP in the last 72 hours.   ASSESSMENT / PLAN:  PULMONARY A: Acute on Chronic Respiratory Failure with Hypercarbia Acute Pulmonary edema>>improving Right Lung tumor Possible right Lung Pleural Effusion (not clear from CT imaging) Severe COPD ? CAP Morbid obesity with chronic hypercarbia, likely obesity hypoventilation syndrome Hx: Sleep apnea, COPD, Lung cancer   - 03/03/2018 - ? Better but clinically it appears she can be off contnouus bipap in day  Plan Supplemental O2 to maintain O2 sats 88 to 94% Bipap- prn & mandatory QHS (wean as tolerated during the day) Wean Solu-Medrol on 6/3 to 40 mg Daily Continue Bronchodilator - Xopenex TID and prn Continue ICS: Pulmicort BID Will D/c Azithromycin & Ceftriaxone on 6/3, as she has received full course 6/3 will give 40mg  Lasix x1 dose F/u CXR in am 5/4 Oncology following, appreciate input Palliative care consulted,  appreciate input   Type A Thoracic Aortic Dissection (4.7 cm)-->NOT a surgical candidate  HTN  ECHO 5/31 - Ef 55% . Elevated LVEDP +, PASP 43  Plan:  ICU monitoring Continue Esmolol & Cardene gtt's, titrate to keep SBP 100-120 mmHg (wean off esmolol as able due to pharmacy supply shortage) 6/3 Add scheduled Norvasc & Metoprolol PO In hopes of being able to wean Esmolol & Cardene gtt's 6/3 - Give 40 mg Lasix x1 dose   Consult PT, mobilize OOB as tolerated VTE Prophylaxis: SCD's Nutrition: Clear liquids as tolerated Goals of care: DNR SUP: Protonix  Family update: pt's son Sherren Mocha at bedside, updated 6/3.  Darlyn Chamber  [place] [facility] [place] [facility] Pager: [phone], If no answer or between  15:00h - 7:00h: call [phone] Telephone: [phone]

## 2018-03-04 ENCOUNTER — Other Ambulatory Visit: Payer: Self-pay | Admitting: Family

## 2018-03-04 ENCOUNTER — Inpatient Hospital Stay (HOSPITAL_COMMUNITY): Payer: Medicare Other

## 2018-03-04 LAB — CBC WITH DIFFERENTIAL/PLATELET
BASOS ABS: 0 10*3/uL (ref 0.0–0.1)
BASOS PCT: 0 %
EOS ABS: 0 10*3/uL (ref 0.0–0.7)
EOS PCT: 0 %
HCT: 29.8 % — ABNORMAL LOW (ref 36.0–46.0)
Hemoglobin: 9.1 g/dL — ABNORMAL LOW (ref 12.0–15.0)
Lymphocytes Relative: 4 %
Lymphs Abs: 0.4 10*3/uL — ABNORMAL LOW (ref 0.7–4.0)
MCH: 28.3 pg (ref 26.0–34.0)
MCHC: 30.5 g/dL (ref 30.0–36.0)
MCV: 92.8 fL (ref 78.0–100.0)
MONO ABS: 0.1 10*3/uL (ref 0.1–1.0)
Monocytes Relative: 1 %
Neutro Abs: 11.5 10*3/uL — ABNORMAL HIGH (ref 1.7–7.7)
Neutrophils Relative %: 95 %
PLATELETS: 365 10*3/uL (ref 150–400)
RBC: 3.21 MIL/uL — AB (ref 3.87–5.11)
RDW: 15.4 % (ref 11.5–15.5)
WBC: 12.1 10*3/uL — AB (ref 4.0–10.5)

## 2018-03-04 LAB — BASIC METABOLIC PANEL
ANION GAP: 10 (ref 5–15)
BUN: 35 mg/dL — ABNORMAL HIGH (ref 6–20)
CALCIUM: 8.6 mg/dL — AB (ref 8.9–10.3)
CO2: 26 mmol/L (ref 22–32)
Chloride: 101 mmol/L (ref 101–111)
Creatinine, Ser: 0.69 mg/dL (ref 0.44–1.00)
GFR calc Af Amer: >60 mL/min (ref >60)
GLUCOSE: 123 mg/dL — AB (ref 65–99)
POTASSIUM: 4.6 mmol/L (ref 3.5–5.1)
SODIUM: 137 mmol/L (ref 135–145)

## 2018-03-04 MED ORDER — HEPARIN SOD (PORK) LOCK FLUSH 100 UNIT/ML IV SOLN
INTRAVENOUS | Status: AC
  Filled 2018-03-04: qty 5

## 2018-03-04 MED ORDER — VANCOMYCIN HCL IN DEXTROSE 1-5 GM/200ML-% IV SOLN
INTRAVENOUS | Status: AC
  Filled 2018-03-04: qty 200

## 2018-03-04 MED ORDER — CEFAZOLIN SODIUM-DEXTROSE 2-4 GM/100ML-% IV SOLN
INTRAVENOUS | Status: AC
  Filled 2018-03-04: qty 100

## 2018-03-04 MED ORDER — FUROSEMIDE 10 MG/ML IJ SOLN
40.0000 mg | Freq: Every day | INTRAMUSCULAR | Status: AC
  Administered 2018-03-05: 40 mg via INTRAVENOUS
  Filled 2018-03-04: qty 4

## 2018-03-04 MED ORDER — SODIUM CHLORIDE 0.9% FLUSH
10.0000 mL | INTRAVENOUS | Status: DC | PRN
  Administered 2018-03-12: 10 mL
  Filled 2018-03-04: qty 40

## 2018-03-04 MED ORDER — SODIUM CHLORIDE 0.9% FLUSH
10.0000 mL | Freq: Two times a day (BID) | INTRAVENOUS | Status: DC
  Administered 2018-03-04 – 2018-03-15 (×13): 10 mL

## 2018-03-04 MED ORDER — FLUMAZENIL 0.5 MG/5ML IV SOLN
INTRAVENOUS | Status: AC
  Filled 2018-03-04: qty 5

## 2018-03-04 MED ORDER — NALOXONE HCL 0.4 MG/ML IJ SOLN
INTRAMUSCULAR | Status: AC
  Filled 2018-03-04: qty 1

## 2018-03-04 MED ORDER — MIDAZOLAM HCL 2 MG/2ML IJ SOLN
INTRAMUSCULAR | Status: AC
  Filled 2018-03-04: qty 4

## 2018-03-04 MED ORDER — FENTANYL CITRATE (PF) 100 MCG/2ML IJ SOLN
INTRAMUSCULAR | Status: AC
  Filled 2018-03-04: qty 4

## 2018-03-04 MED ORDER — FUROSEMIDE 10 MG/ML IJ SOLN: 40.0000 mg | Freq: Every day | INTRAMUSCULAR | Status: DC

## 2018-03-04 MED ORDER — FUROSEMIDE 10 MG/ML IJ SOLN
40.0000 mg | Freq: Once | INTRAMUSCULAR | Status: AC
  Administered 2018-03-04: 40 mg via INTRAVENOUS
  Filled 2018-03-04: qty 4

## 2018-03-04 MED ORDER — METOPROLOL SUCCINATE ER 25 MG PO TB24
150.0000 mg | ORAL_TABLET | Freq: Every day | ORAL | Status: DC
  Administered 2018-03-05 – 2018-03-08 (×4): 150 mg via ORAL
  Filled 2018-03-04 (×4): qty 6

## 2018-03-04 MED ORDER — LIDOCAINE HCL (PF) 1 % IJ SOLN
INTRAMUSCULAR | Status: AC
  Filled 2018-03-04: qty 30

## 2018-03-04 NOTE — Progress Notes (Signed)
PULMONARY / CRITICAL CARE MEDICINE   Name: Alisha Fisher MRN: [id number] DOB: 1946/03/28    ADMISSION DATE:  02/09/2018 CONSULTATION DATE:  02/27/2018  REFERRING MD:  Horris Latino  CHIEF COMPLAINT:  Dsypnea  brief This is a 72 year old female with a past medical history significant for severe COPD who has squamous cell carcinoma of the lung which recurred in 2019 when seen on a February 2019 PET scan.  She had a bronchoscopy in March 2019 which confirmed suspicion of recurrent squamous cell carcinoma and then she started treatment with immunotherapy (Pembrolizumab) in May 2019.  She has received 1 treatment.  She presented to [facility] on Feb 24, 2018 complaining of 4 to 5 days of chest congestion and lethargy and shortness of breath.  A CT scan performed after admission showed persistent right upper lobe radiation fibrosis, and what appears to be significant growth of the right lower lobe mass with pleural involvement versus consolidation and pneumonia.  She was admitted by the hospitalist service and has been receiving treatment for a COPD exacerbation, healthcare associated pneumonia, and has received some diuretic therapy.  On 5/30 she was transferred to the intensive care unit because of increasing work of breathing and lethargy.  She was noted to be hypercarbic.  She was started on noninvasive mechanical ventilation after arrival.  Shortly after starting on that treatment her anxiety dramatically worsened and so she was given Ativan.  Pulmonary and critical care medicine was consulted because of the patient's worsening respiratory status and a continued FULL CODE STATUS.  Since arrival to the intensive care unit she has become less anxious and is more compliant with NIMV. On 5/30, CT Chest revealed a type A Thoracic aortic dissection of which she is not a surgical candidate. Pt and family have since decided on DNR status.  ->has very poor functional status and limited activity status at  baseline for at least the last year leading up to this.     SIGNIFICANT EVENTS: 5/27>>Admssion to [facility] 5/30>> CT reveals Type A aortic dissection 5/31 - On Bipap, 40%FiO2 Repeat CT chest w/ angiogram ordered on 5/30 by primary service incidentally found: suspected type A thoracic aortic dissection extending down to aortic root.  Esmolol gtt infusing; not a surgical candidate family made aware not likely to survive  Remains lethargic     SUBJECTIVE/OVERNIGHT/INTERVAL HX Off Bipap, breathing comfortably on [place] Complains of discomfort with Foley Wants to eat and get OOB  VITAL SIGNS: BP (!) 127/43   Pulse 64   Temp 97.6 F (36.4 C) (Oral)   Resp 20   Ht 5\' 7"  (1.702 m)   Wt 230 lb (104.3 kg)   SpO2 100%   BMI 36.02 kg/m   HEMODYNAMICS:    VENTILATOR SETTINGS: FiO2 (%):  [35 %] 35 %  INTAKE / OUTPUT: I/O last 3 completed shifts: In: 5187.8 [P.O.:120; I.V.:5067.8] Out: 2458 [Urine:3595]  PHYSICAL EXAMINATION: GENERAL: Acute on chronically ill-appearing obese female, laying in bed, on [place], in no acute distress HEENT: Atraumatic, normocephalic, neck supple, pupils PERRL CARDIAC: RRR, s1s2 noted, No M/R/G  LUNGS: Crackles to RML & RLL, Clear throughout left, symmetrical expansion, even, non-labored, no assessory muscle use  ABDOMEN: Obese, soft, non-tender, BS+ x4 EXTREMITIES: No deformities, 2+ bilateral UE edema SKIN: Warm, dry, No obvious rashes, lesions, or ulcerations NEURO: Awake, alert, calm, follows commands                       PULMONARY Recent  Labs  Lab 02/27/18 1100 03/02/18 1035  PHART 7.244* 7.341*  PCO2ART 88.7* 60.2*  PO2ART 73.2* 78.1*  HCO3 37.0* 31.7*  O2SAT 92.8 94.2    CBC Recent Labs  Lab 03/02/18 0336 03/03/18 0344 03/04/18 0342  HGB 9.5* 8.8* 9.1*  HCT 31.2* 28.4* 29.8*  WBC 10.4 10.2 12.1*  PLT 343 343 365    COAGULATION Recent Labs  Lab 02/26/18 0526  INR 1.16    CARDIAC  No results for input(s):  TROPONINI in the last 168 hours. No results for input(s): PROBNP in the last 168 hours.   CHEMISTRY Recent Labs  Lab 02/28/18 0311 03/01/18 0336 03/02/18 0336 03/03/18 0344 03/04/18 0342  NA 139 135 133* 133* 137  K 4.7 5.1 5.1 4.6 4.6  CL 91* 91* 93* 97* 101  CO2 39* 33* 29 26 26   GLUCOSE 96 99 100* 111* 123*  BUN 29* 43* 48* 41* 35*  CREATININE 0.82 0.98 0.86 0.65 0.69  CALCIUM 8.8* 9.1 9.1 8.5* 8.6*   Estimated Creatinine Clearance: 81.3 mL/min (by C-G formula based on SCr of 0.69 mg/dL).   LIVER Recent Labs  Lab 02/26/18 0526  INR 1.16     INFECTIOUS Recent Labs  Lab 02/26/18 0526  PROCALCITON 0.34     ENDOCRINE CBG (last 3)  No results for input(s): GLUCAP in the last 72 hours.   ASSESSMENT / PLAN:  PULMONARY A: Acute on Chronic Respiratory Failure with Hypercarbia Acute Pulmonary edema Right Lung tumor Possible right Lung Pleural Effusion (not clear from CT imaging) Severe COPD ? CAP Morbid obesity with chronic hypercarbia, likely obesity hypoventilation syndrome Hx: Sleep apnea, COPD, Lung cancer   Plan Supplemental O2 to maintain O2 sats 88 to 94% Bipap prn & mandatory QHS Continue Solu-Medrol 40 mg daily (was weaned to 40 mg daily on 6/3) Continue Bronchodilator: Xopenex TID and prn Continue ICS: Pulmicort BID Completed full course of Abx for CAP on 6/3 Lasix 40 mg IV daily x2 days (consider transition to PO lasix after final IV dose on 6/5) F/u CXR prn Oncology following, appreciate input Palliative care consulted, appreciate input   Type A Thoracic Aortic Dissection (4.7 cm)-->NOT a surgical candidate  HTN  ECHO 5/31 - Ef 55% . Elevated LVEDP +, PASP 43  Plan:  Telemetry monitoring Goal SBP <140 mmHg Wean Esmolol & Cardene gtt's as able 6/4 Increase metoprolol PO to 150 mg daily in hopes of being able to wean Esmolol & Cardene gtt's off Continue Norvasc 6/4 - Lasix 40 mg IV daily x2 days (then would transition to PO  lasix) Follow urine output Follow BMP with diuresis  PT consulted -  Mobilize OOB as able  VTE prophylaxis: SCD's Nutrition: Heart healthy, carb modified diet Goals of care: DNR  SUP: Protonix  Family update: pt's son Sherren Mocha at bedside, updated am 6/4.   Darel Hong, AGACNP-BC [place] [facility] Pager: [phone], If no answer or between  15:00h - 7:00h: call [phone] Telephone: [phone]

## 2018-03-04 NOTE — Progress Notes (Signed)
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  In the ED, patient was found to be hypoxic, febrile and tachycardic.  Patient admitted for further management  Today, patient is currently off bipap, was able to communicate, looked more alert and awake. No acute events overnight, no worsening respiratory status.  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[place])   Acute on chronic respiratory failure with hypercapnia (HCC)   Acute on chronic hypoxic and hypercapnic respiratory failure/COPD/obesity hypoventilation syndrome/Acute pulmonary edema Currently weaned off bipap, will need at night ABG with improved hypercarbia PCCM consulted, appreciate recs Continue duonebs/inhaler, tapered steroids, lasix prn Continue to monitor in ICU  Type A Thoracic Aortic dissection CT showed type A thoracic aortic dissection, 4.7cm Not a surgical candidate, strict BP/HR control ECHO showed: EF of 55-60%, PA peak pressure of 54mmhg Pharmacy reported shortage of  esmolol gtt as pt is requiring a lot Continue PO metoprolol, norvasc, with plans to wean off esmolol and cardene to keep SBP 100-120 mmhg Close monitoring  ??Post-obstructive pneumonia/radiation fibrosis Afebrile, with leukocytosis Procalcitonin trended down to 0.34 Urine strep pneumo, Legionella both negative BC x2, NGTD Chest x-ray: Extensive peribronchial airspace consolidation versus lymphangitic spread of tumor, repeat with now worsening vascular congestion CT chest: Continued presence of large right lower lobe mass with pleural spread is noted consistent with history of lung cancer. 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 is well Completed IV ceftriaxone and azithromycin X 7 days  AKI Resolved Daily BMP  Iron deficiency/anemia of chronic disease Baseline hemoglobin between 10-11 Status post IV iron on 02/26/2018 Daily CBC  Squamous cell carcinoma of the right lung Oncology following, on Keytruda (oncology considering switching to chemo) Plan for Port-A-Cath placement 03/04/18  Acute on chronic diastolic heart failure Echo showed EF of 65 to 56%, grade 2 diastolic dysfunction Lasix prn  Hypertension Stable, controlled Started on PO metoprolol, norvasc, with plans to wean off esmolol and cardene to keep SBP 100-120 mmhg  Obstructive sleep apnea On bipap scheduled at night  GERD Continue Protonix  Deconditioning/GOC discussion Palliative consulted due to very poor clinical condition, poor/guarded prognosis    Code Status: DNR  Family Communication: None at bedside  Disposition Plan: Once significant improvement  Consultants:  Oncology  PCCM  Palliative   Procedures:  None  Antimicrobials:  S/p Ceftriaxone and Azithromycin  DVT prophylaxis: SCDs   Objective: Vitals:   03/04/18 1159 03/04/18 1230 03/04/18 1315 03/04/18 1400  BP:  (!) 139/47 (!) 131/45 (!) 131/42  Pulse:  63 68 66  Resp:  11 (!) 21 19  Temp:  98.4 F (36.9 C)       TempSrc: Oral     SpO2:  97% 96% 97%  Weight:      Height:        Intake/Output Summary (Last 24 hours) at 03/04/2018 1619 Last data filed at 03/04/2018 1400 Gross per 24 hour  Intake 2485.43 ml  Output 3030 ml  Net -544.57 ml   Filed Weights   02/16/2018 1437  Weight: 104.3 kg (230 lb)    Exam:   General: NAD, on bipap  Cardiovascular: S1, S2 present  Respiratory: Diminished air entry bilaterally  Abdomen: Soft, obese, nontender, nondistended, bowel sounds present  Musculoskeletal: No pedal edema bilaterally  Skin: Normal  Psychiatry: Anxious   Data Reviewed: CBC: Recent Labs  Lab 02/28/18 0311 03/01/18 0336 03/02/18 0336 03/03/18 0344 03/04/18 0342  WBC 11.2* 11.1* 10.4 10.2 12.1*  NEUTROABS 10.5* 10.6* 9.7* 9.7* 11.5*  HGB 9.5* 9.0* 9.5* 8.8* 9.1*  HCT 32.0* 29.8* 31.2* 28.4* 29.8*  MCV 96.4 93.7 93.7 92.8 92.8  PLT 360 355 343 343 315   Basic Metabolic Panel: Recent Labs  Lab 02/28/18 0311 03/01/18 0336 03/02/18 0336 03/03/18 0344 03/04/18 0342  NA 139 135 133* 133* 137  K 4.7 5.1 5.1 4.6 4.6  CL 91* 91* 93* 97* 101  CO2 39* 33* 29 26 26   GLUCOSE 96 99 100* 111* 123*  BUN 29* 43* 48* 41* 35*  CREATININE 0.82 0.98 0.86 0.65 0.69  CALCIUM 8.8* 9.1 9.1 8.5* 8.6*   GFR: Estimated Creatinine Clearance: 81.3 mL/min (by C-G formula based on SCr of 0.69 mg/dL). Liver Function Tests: No results for input(s): AST, ALT, ALKPHOS, BILITOT, PROT, ALBUMIN in the last 168 hours. No results for input(s): LIPASE, AMYLASE in the last 168 hours. No results for input(s): AMMONIA in the last 168 hours. Coagulation Profile: Recent Labs  Lab 02/26/18 0526  INR 1.16   Cardiac Enzymes: No results for input(s): CKTOTAL, CKMB, CKMBINDEX, TROPONINI in the last 168 hours. BNP (last 3 results) No results for input(s): PROBNP in the last 8760 hours. HbA1C: No results for input(s): HGBA1C in the last 72 hours. CBG: Recent Labs  Lab 02/27/18 1106  GLUCAP 139*    Lipid Profile: No results for input(s): CHOL, HDL, LDLCALC, TRIG, CHOLHDL, LDLDIRECT in the last 72 hours. Thyroid Function Tests: No results for input(s): TSH, T4TOTAL, FREET4, T3FREE, THYROIDAB in the last 72 hours. Anemia Panel: No results for input(s): VITAMINB12, FOLATE, FERRITIN, TIBC, IRON, RETICCTPCT in the last 72 hours. Urine analysis:    Component Value Date/Time   COLORURINE YELLOW 02/26/2018 1627   APPEARANCEUR CLEAR 02/26/2018 1627   LABSPEC 1.018 02/26/2018 1627   PHURINE 5.0 02/26/2018 1627   GLUCOSEU NEGATIVE 02/26/2018 1627   HGBUR NEGATIVE 02/26/2018 1627   BILIRUBINUR NEGATIVE 02/26/2018 1627   KETONESUR NEGATIVE 02/26/2018 1627   PROTEINUR NEGATIVE 02/26/2018 1627   NITRITE NEGATIVE 02/26/2018 1627   LEUKOCYTESUR NEGATIVE 02/26/2018 1627   Sepsis Labs: @LABRCNTIP (procalcitonin:4,lacticidven:4)  ) Recent Results (from the past 240 hour(s))  Blood Culture (routine x 2)     Status: None   Collection Time: 02/04/2018  2:47 PM  Result Value Ref Range Status   Specimen Description   Final    BLOOD RIGHT ANTECUBITAL Performed at [facility], [place] [address] [place], [place] [address]    Special Requests   Final    BOTTLES DRAWN AEROBIC AND ANAEROBIC Blood Culture adequate volume Performed at [facility]  [address] [place], [place] [address]    Culture   Final    NO GROWTH 5 DAYS Performed at [facility], [place] [address], [place], [place] [address]    Report Status 03/01/2018 FINAL  Final  Blood Culture (routine x 2)     Status: None   Collection Time: 01/30/2018  2:48 PM  Result Value Ref Range Status   Specimen Description   Final    BLOOD LEFT FOREARM Performed at [facility] [address] [place], [place] [address]    Special Requests   Final    BOTTLES DRAWN AEROBIC AND ANAEROBIC Blood Culture adequate volume Performed at [facility] [address], [place], [place] [address]    Culture   Final    NO GROWTH 5 DAYS Performed at [facility], [place] [address] [place], [place] [address]    Report Status 03/01/2018 FINAL  Final  MRSA PCR Screening     Status: None   Collection Time: 02/12/2018  4:50 PM  Result Value Ref Range Status   MRSA by PCR NEGATIVE NEGATIVE Final    Comment:        The GeneXpert MRSA Assay (FDA approved for NASAL specimens only), is one component of a comprehensive MRSA colonization surveillance program. It is not intended to diagnose MRSA infection nor to guide or monitor treatment for MRSA infections. Performed at [facility], [place] [address] [place], [place] [address]   Culture, sputum-assessment     Status: None   Collection Time: 02/26/18  4:40 AM  Result Value Ref Range Status   Specimen Description SPUTUM  Final   Special Requests NONE  Final   Sputum evaluation   Final    Sputum specimen not acceptable for testing.  Please recollect.   [place] [id number] 02/26/18 A NAVARRO Performed at [facility] [address], [place], [place] [address]    Report Status 02/26/2018 FINAL  Final  Urine Culture     Status: None   Collection Time: 02/26/18  5:45 PM  Result Value Ref Range Status   Specimen Description   Final    URINE, RANDOM Performed at [facility] [address], [place], [place] [address]    Special Requests   Final    NONE Performed at [facility], [place] [address] [place], [place] [address]    Culture   Final    NO GROWTH Performed at [facility], [place] [address], [place], [place] [address]    Report Status 02/28/2018 FINAL  Final      Studies: Dg Chest Port 1 View  Result Date: 03/04/2018 CLINICAL DATA:  Acute respiratory failure. History of COPD, former smoker, lung malignancy. EXAM: PORTABLE CHEST 1 VIEW COMPARISON:  Portable chest x-ray of March 01, 2018 FINDINGS: The lungs are  adequately inflated. Patchy airspace opacity in the left perihilar region persists but is slightly less conspicuous today. There is stable volume loss on the right with pleural thickening or pleural fluid overlying the superior and lateral aspects of the right lung. Increased density at the right lung base persists consistent with a known mass. The cardiac silhouette is enlarged. The pulmonary vascularity is mildly prominent. There is calcification in the wall of the aortic arch. The observed bony thorax is unremarkable. IMPRESSION: Persistent airspace opacity in the left perihilar region likely reflects pneumonia. Persistent volume loss on the right with increased mid  and lower right hemithorax density consistent with pleural fluid and a known mass. Persistent cardiomegaly with slightly increased pulmonary vascular prominence consistent with mild CHF. Electronically Signed   By: David  Martinique M.D.   On: 03/04/2018 07:20    Scheduled Meds: . amLODipine  10 mg Oral Daily  . budesonide (PULMICORT) nebulizer solution  0.5 mg Nebulization BID  . dextromethorphan-guaiFENesin  1 tablet Oral BID  . [START ON 03/05/2018] furosemide  40 mg Intravenous Daily  . levalbuterol  0.63 mg Nebulization Q4H  . [START ON 03/05/2018] metoprolol succinate  150 mg Oral Daily  . pantoprazole  40 mg Oral BID  . sodium chloride flush  10-40 mL Intracatheter Q12H    Continuous Infusions: . esmolol Stopped (03/04/18 1040)  . niCARDipine Stopped (03/04/18 1040)     LOS: 8 days     Alma Friendly, MD [facility]  If 7PM-7AM, please contact night-coverage [email or web address] Password [email or web address] 03/04/2018, 4:19 PM

## 2018-03-04 NOTE — Sedation Documentation (Signed)
Case Cancelled

## 2018-03-04 NOTE — Progress Notes (Signed)
SLP Cancellation Note  Patient Details Name: Alisha Fisher MRN: [id number] DOB: [birth year]   Cancelled treatment:       Reason Eval/Treat Not Completed: Other (comment)(pt npo for port placement per chart review)   Macario Golds 03/04/2018, 4:04 PM Luanna Salk, [facility] SLP [phone]

## 2018-03-04 NOTE — Progress Notes (Addendum)
Physical Therapy Treatment Patient Details Name: Alisha Fisher MRN: [id number] DOB: 27-Jul-1946 Today's Date: 03/04/2018    History of Present Illness 72 y.o. female with a past medical history significant for Squamous cell carcinoma Lung locally advanced, COPD home O2 3L, wheelchair bound, morbid obesity, OSA not on CPAP, dCHF, chronic IDA, and HTN who presents with 4-5 days malaise, progressive cough and now dyspnea and admitted for acute on chronic respiratory failure with hypoxia and hypercapnia    PT Comments    +2 mod assist for sit to stand, with significant encouragement needed. Pt pivoted to recliner with RW with +2 mod assist to control descent. SaO2 77% on 3L O2 with activity, 87% on 5L O2 after 1 minute seated rest. Activity tolerance limited by fatigue.   Follow Up Recommendations  SNF     Equipment Recommendations  None recommended by PT    Recommendations for Other Services       Precautions / Restrictions Precautions Precaution Comments: monitor sats Restrictions Weight Bearing Restrictions: No    Mobility  Bed Mobility Overal bed mobility: Needs Assistance Bed Mobility: Supine to Sit     Supine to sit: HOB elevated;Min guard     General bed mobility comments: provided a hand for pt to self assist trunk upright, slight assist for LEs onto bed  Transfers Overall transfer level: Needs assistance Equipment used: Rolling walker (2 wheeled) Transfers: Sit to/from [facility] Sit to Stand: +2 safety/equipment;+2 physical assistance;Mod assist;From elevated surface Stand pivot transfers: Min assist;+2 physical assistance       General transfer comment: verbal cues for hand placement, assist to steady and rise, took 3 attempts to rise using momentum, SPO2 dropped to 77% on 3L O2 with transfers, up to 87% on 5L O2 after 1 minute seated rest  Ambulation/Gait                 Stairs             Wheelchair Mobility    Modified  [place] (Stroke Patients Only)       Balance                                            Cognition Arousal/Alertness: Awake/alert Behavior During Therapy: WFL for tasks assessed/performed Overall Cognitive Status: Within Functional Limits for tasks assessed                                 General Comments: slow to respond at times      Exercises      General Comments        Pertinent Vitals/Pain Pain Assessment: No/denies pain    Home Living                      Prior Function            PT Goals (current goals can now be found in the care plan section) Acute Rehab PT Goals PT Goal Formulation: With patient Time For Goal Achievement: 03/11/18 Potential to Achieve Goals: Fair Progress towards PT goals: Progressing toward goals    Frequency    Min 3X/week      PT Plan Current plan remains appropriate    Co-evaluation              AM-PAC  PT "6 Clicks" Daily Activity  Outcome Measure  Difficulty turning over in bed (including adjusting bedclothes, sheets and blankets)?: A Little Difficulty moving from lying on back to sitting on the side of the bed? : A Lot Difficulty sitting down on and standing up from a chair with arms (e.g., wheelchair, bedside commode, etc,.)?: Unable Help needed moving to and from a bed to chair (including a wheelchair)?: A Lot Help needed walking in hospital room?: Total Help needed climbing 3-5 steps with a railing? : Total 6 Click Score: 10    End of Session Equipment Utilized During Treatment: Gait belt;Oxygen Activity Tolerance: Patient limited by fatigue Patient left: with call bell/phone within reach;in chair;with chair alarm set Nurse Communication: Mobility status PT Visit Diagnosis: Difficulty in walking, not elsewhere classified (R26.2);Muscle weakness (generalized) (M62.81)     Time: 6314-9702 PT Time Calculation (min) (ACUTE ONLY): 21 min  Charges:  $Therapeutic  Activity: 8-22 mins                    G Codes:          Blondell Reveal Kistler 03/04/2018, 1:06 PM [phone]

## 2018-03-04 NOTE — Consult Note (Signed)
Chief Complaint: Patient was seen in consultation today for squamous cell carcinoma of lung.  Referring Physician(s): Volanda Napoleon  Supervising Physician: Marybelle Killings  Patient Status: [facility] - In-pt  History of Present Illness: Alisha Fisher is a 72 y.o. 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 She presented to ED 01/31/2018 with complaint of shortness of breath. She was admitted and diagnosed with pneumonia. In addition, she demonstrates very poor IV access.  IR requested by Dr. Marin Olp for possible image-guided Port-a-cath insertion due to very poor IV access. Patient awake and alert laying in bed. Accompanied by son at bedside. Complains of shortness of breath, stable at this time. Denies fever, chest pain, abdominal pain, or dizziness.  Past Medical History:  Diagnosis Date  . Cancer ([place])    lung cancer  . COPD, severe ([place])   . Goals of care, counseling/discussion 02/12/2018  . HTN (hypertension)   . Iron deficiency anemia 11/07/2017  . Panic attack   . Pneumonia   . Pulmonary emphysema ([place])   . Sleep apnea    no cpap needs more recent sleep study  . SOB (shortness of breath)     Past Surgical History:  Procedure Laterality Date  . CATARACT EXTRACTION    . ENDOBRONCHIAL ULTRASOUND Bilateral 12/17/2016   Procedure: ENDOBRONCHIAL ULTRASOUND;  Surgeon: Javier Glazier, MD;  Location: [facility] ENDOSCOPY;  Service: Cardiopulmonary;  Laterality: Bilateral;  . IR GENERIC HISTORICAL  12/21/2016   IR SINUS/FIST TUBE CHK-NON GI 12/21/2016 [facility]-INTERV RAD  . IR GENERIC HISTORICAL  12/21/2016   IR US GUIDE BX ASP/DRAIN 12/21/2016 Corrie Mckusick, DO [facility]-INTERV RAD  . IR RADIOLOGIST EVAL & MGMT  01/17/2017  . VIDEO BRONCHOSCOPY WITH ENDOBRONCHIAL ULTRASOUND N/A 12/10/2017   Procedure: VIDEO BRONCHOSCOPY WITH  ENDOBRONCHIAL ULTRASOUND;  Surgeon: Collene Gobble, MD;  Location: [facility] OR;  Service: Thoracic;  Laterality: N/A;  . WISDOM TOOTH EXTRACTION      Allergies: Ciprofloxacin; Flagyl [metronidazole]; and Zosyn [piperacillin sod-tazobactam so]  Medications: Prior to Admission medications   Medication Sig Start Date End Date Taking? Authorizing Provider  ADVAIR DISKUS 250-50 MCG/DOSE AEPB Inhale 1 puff into the lungs 2 (two) times daily. 12/10/17  Yes Collene Gobble, MD  albuterol (PROVENTIL HFA;VENTOLIN HFA) 108 (90 Base) MCG/ACT inhaler Inhale 2 puffs into the lungs every 6 (six) hours as needed for wheezing or shortness of breath. 06/20/17  Yes Javier Glazier, MD  amLODipine (NORVASC) 10 MG tablet Take 1 tablet (10 mg total) daily by mouth. 08/06/17  Yes Belva Crome, MD  ANORO ELLIPTA 62.5-25 MCG/INH AEPB Inhale 1 puff into the lungs daily.  02/14/18  Yes [provider]  clonazePAM (KLONOPIN) 0.5 MG tablet Take 1 tablet (0.5 mg total) by mouth 2 (two) times daily as needed for anxiety. 12/05/17  Yes Collene Gobble, MD  diphenhydrAMINE (BENADRYL) 25 mg capsule Take 25 mg by mouth every 4 (four) hours as needed for itching or allergies.   Yes [provider]  docusate sodium (COLACE) 100 MG capsule Take 100 mg by mouth 2 (two) times daily as needed for mild constipation.   Yes [provider]  fluticasone (FLONASE) 50 MCG/ACT nasal spray Place 2 sprays into both nostrils daily as needed for allergies. 12/10/17  Yes Collene Gobble, MD  losartan-hydrochlorothiazide (HYZAAR) 100-25 MG tablet Take 1 tablet daily by  mouth. 08/06/17  Yes Belva Crome, MD  metoprolol succinate (TOPROL-XL) 100 MG 24 hr tablet Take 2 tablets (200 mg total) by mouth daily. Patient taking differently: Take 150 mg by mouth daily. Take 1.5 tablets (150 mg) daily 12/10/17  Yes Byrum, Rose Fillers, MD  HYDROcodone-homatropine [facility]) 5-1.5 MG/5ML syrup Take 5 mLs by mouth every 6 (six) hours as needed  for cough. 02/19/18   Volanda Napoleon, MD  loratadine (CLARITIN) 10 MG tablet Take 1 tablet (10 mg total) by mouth daily as needed for allergies. 12/10/17   Collene Gobble, MD  ondansetron (ZOFRAN) 8 MG tablet Take 1 tablet (8 mg total) by mouth 2 (two) times daily as needed (Nausea or vomiting). 02/19/18   Volanda Napoleon, MD  pantoprazole (PROTONIX) 40 MG tablet Take 1 tablet (40 mg total) by mouth 2 (two) times daily. 02/19/18   Volanda Napoleon, MD  prochlorperazine (COMPAZINE) 10 MG tablet Take 1 tablet (10 mg total) by mouth every 6 (six) hours as needed (Nausea or vomiting). 02/19/18   Volanda Napoleon, MD     Family History  Problem Relation Age of Onset  . Allergies Unknown   . COPD Cousin   . Breast cancer Mother   . Diabetes Father   . Diabetes Son     Social History   Socioeconomic History  . Marital status: Widowed    Spouse name: Not on file  . Number of children: Not on file  . Years of education: Not on file  . Highest education level: Not on file  Occupational History  . Occupation: retired  Scientific laboratory technician  . Financial resource strain: Not on file  . Food insecurity:    Worry: Not on file    Inability: Not on file  . Transportation needs:    Medical: Not on file    Non-medical: Not on file  Tobacco Use  . Smoking status: Former Smoker    Packs/day: 0.50    Years: 50.00    Pack years: 25.00    Types: Cigarettes    Last attempt to quit: 11/01/2016    Years since quitting: 1.3  . Smokeless tobacco: Never Used  Substance and Sexual Activity  . Alcohol use: No  . Drug use: No  . Sexual activity: Not on file  Lifestyle  . Physical activity:    Days per week: Not on file    Minutes per session: Not on file  . Stress: Not on file  Relationships  . Social connections:    Talks on phone: Not on file    Gets together: Not on file    Attends religious service: Not on file    Active member of club or organization: Not on file    Attends meetings of clubs or  organizations: Not on file    Relationship status: Not on file  Other Topics Concern  . Not on file  Social History Narrative   [facility] (12/10/16):   Patient's widower son and grandchildren moved in with her. Her husband passed years ago. Previously worked in Press [employer].     Review of Systems: A 12 point ROS discussed and pertinent positives are indicated in the HPI above.  All other systems are negative.  Review of Systems  Constitutional: Negative for activity change and fever.  Respiratory: Positive for shortness of breath. Negative for wheezing.   Cardiovascular: Negative for chest pain and palpitations.  Gastrointestinal: Negative for abdominal pain.  Neurological: Negative for dizziness.  Psychiatric/Behavioral: Negative for behavioral problems and confusion.    Vital Signs: BP (!) 127/43   Pulse 64   Temp 97.6 F (36.4 C) (Oral)   Resp 20   Ht 5\' 7"  (1.702 m)   Wt 230 lb (104.3 kg)   SpO2 100%   BMI 36.02 kg/m   Physical Exam  Constitutional: She is oriented to person, place, and time. She appears well-developed and well-nourished. No distress.  Cardiovascular: Normal rate, regular rhythm and normal heart sounds.  No murmur heard. Pulmonary/Chest: Effort normal and breath sounds normal. No respiratory distress. She has no wheezes.  Neurological: She is alert and oriented to person, place, and time.  Skin: Skin is warm and dry.  Psychiatric: She has a normal mood and affect. Her behavior is normal. Judgment and thought content normal.  Nursing note and vitals reviewed.    MD Evaluation Airway: WNL Heart: WNL Abdomen: WNL Chest/ Lungs: WNL ASA  Classification: 3 Mallampati/Airway Score: Two   Imaging: Ct Chest W Contrast  Result Date: 02/19/2018 CLINICAL DATA:  Dyspnea. EXAM: CT CHEST WITH CONTRAST TECHNIQUE: Multidetector CT imaging of the chest was performed during intravenous contrast administration. CONTRAST:  42mL OMNIPAQUE IOHEXOL 300 MG/ML   SOLN COMPARISON:  PET scan of Feb 07, 2018.  CT scan of August 07, 2017. FINDINGS: Cardiovascular: Atherosclerosis of thoracic aorta is noted without aneurysm formation. Coronary artery calcifications are noted. No pericardial effusion is noted. Mediastinum/Nodes: Thyroid gland and esophagus are unremarkable. Subcarinal adenopathy measuring 3.5 cm is noted. 2.4 cm right paratracheal adenopathy is noted. 12 mm right axillary lymph node is noted. Lungs/Pleura: No pneumothorax is noted. Continued presence of large mass in right lower lobe consistent with malignancy. Pleural spread of tumor is also noted and unchanged. Radiation fibrosis is noted medially in right upper lobe. Stable scarring or inflammation is noted anteriorly in the left upper lobe. Upper Abdomen: No acute abnormality. Musculoskeletal: No chest wall abnormality. No acute or significant osseous findings. IMPRESSION: Continued presence of large right lower lobe mass with pleural spread is noted consistent with history of lung cancer. 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 is well. Stable scarring or inflammation is noted anteriorly in the left upper lobe. Right paratracheal and subcarinal and right axillary adenopathy is noted consistent with metastatic disease. Coronary artery calcifications are noted suggesting coronary artery disease. Aortic Atherosclerosis (ICD10-I70.0). Electronically Signed   By: Marijo Conception, M.D.   On: 02/07/2018 19:10   Ct Angio Chest Pe W Or Wo Contrast  Result Date: 02/27/2018 CLINICAL DATA:  Lung cancer, elevated D-dimer EXAM: CT ANGIOGRAPHY CHEST WITH CONTRAST TECHNIQUE: Multidetector CT imaging of the chest was performed using the standard protocol during bolus administration of intravenous contrast. Multiplanar CT image reconstructions and MIPs were obtained to evaluate the vascular anatomy. CONTRAST:  168mL ISOVUE-370 IOPAMIDOL (ISOVUE-370) INJECTION 76% COMPARISON:  CT chest dated 02/26/2018  FINDINGS: Cardiovascular: Satisfactory opacification the bilateral pulmonary arteries to the lobar level. No evidence of pulmonary embolism. 4.7 cm ascending thoracic aortic aneurysm, rapidly increasing from recent CT chest, when it measured 4.0 cm. Although unusual in appearance and suboptimally evaluated due to study technique, there is differential enhancement within the lumen, suggesting in ascending thoracic aortic dissection (series 6/image 44). This is favored to be a type A aortic dissection arising at the aortic root and terminating at the origin of the right brachiocephalic artery. Cardiomegaly.  No pericardial effusion. Atherosclerotic calcifications of the aortic arch. Three vessel coronary atherosclerosis. Enlargement of the  main pulmonary artery, raising the possibility of pulmonary arterial hypertension. Mediastinum/Nodes: 13 mm short axis distinct subcarinal node. 2.0 cm short axis azygoesophageal recess node. Visualized thyroid is unremarkable. Lungs/Pleura: Large right lower lobe mass, unchanged from recent CT, with lymphangitic and pleural spread in the right hemithorax. Radiation changes in the anterior left upper lobe. Patchy left lower lobe opacity, likely atelectasis. Small pleural effusion. Mild centrilobular and paraseptal emphysematous changes, upper lobe predominant. No pneumothorax. Upper Abdomen: Visualized upper abdomen is grossly unremarkable, noting a mildly nodular hepatic contour. Musculoskeletal: Visualized osseous structures are within normal limits. Review of the MIP images confirms the above findings. IMPRESSION: No evidence of pulmonary embolism. 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 Rapid enlargement of the ascending thoracic aorta, now measuring 4.7 cm, previously 4.0 cm on recent CT. Thoracic surgery consultation is  suggested. Otherwise unchanged from recent CT and PET. Extensive tumor in the right lower lobe/hemithorax with pleural and nodal metastases, better evaluated on prior studies. Critical Value/emergent results were called by telephone at the time of interpretation on 02/27/2018 at 9:57 pm to Dr. Halford Chessman, who verbally acknowledged these results. Aortic Atherosclerosis (ICD10-I70.0) and Emphysema (ICD10-J43.9). Electronically Signed   By: Julian Hy M.D.   On: 02/27/2018 22:00   Dg Esophagus  Result Date: 02/26/2018 CLINICAL DATA:  Dysphagia.  Metastatic lung cancer. EXAM: ESOPHOGRAM/BARIUM SWALLOW TECHNIQUE: Single contrast examination was performed using  thin barium. FLUOROSCOPY TIME:  Fluoroscopy Time:  42 seconds Radiation Exposure Index (if provided by the fluoroscopic device): 26.1 mGy COMPARISON:  CT scan of the chest dated 02/22/2018 FINDINGS: There is a slight mass effect upon the right side of the esophagus just below the carina consistent with the adenopathy seen on chest x-ray. However, there is no stricture at that site. The patient has poor esophageal motility with a poor secondary stripping wave with to and fro peristalsis in the mid esophagus with intermittent tertiary contractions in the mid esophagus. There is a small hiatal hernia without a stricture. Note is made of a moderate right effusion as well as bibasilar lung consolidation, right greater than left. IMPRESSION: 1. Poor esophageal motility with to and fro peristalsis in the mid esophagus with occasional tertiary contractions in the mid esophagus. 2. Slight mass effect on the mid esophagus due to subcarinal adenopathy. However, this does not cause significant narrowing of the esophagus. 3. Small hiatal hernia without a stricture. Electronically Signed   By: Lorriane Shire M.D.   On: 02/26/2018 16:02   Nm Pet Image Restag (ps) Skull Base To Thigh  Result Date: 02/08/2018 CLINICAL DATA:  Subsequent treatment strategy for lung cancer.  EXAM: NUCLEAR MEDICINE PET SKULL BASE TO THIGH TECHNIQUE: 11.26 mCi F-18 FDG was injected intravenously. Full-ring PET imaging was performed from the skull base to thigh after the radiotracer. CT data was obtained and used for attenuation correction and anatomic localization. Fasting blood glucose: 113 mg/dl COMPARISON:  11/05/2017 FINDINGS: Mediastinal blood pool activity: SUV max 2.49 NECK: No hypermetabolic lymph nodes in the neck. Incidental CT findings: none CHEST: New hypermetabolic right paratracheal lymph node measures 2.1 cm and has an SUV max equal to 13.6. Newly hypermetabolic low right paratracheal lymph node measures 8 mm with an SUV max of 6.79. Subcarinal lymph node measures 1.7 cm and has an SUV max of 16.5. Previously 9 mm within SUV max of 6.3. Right  lower lobe lung mass measures 7.6 by 7.6 cm within SUV max of 13.6. Previously this measured 5.1 x 5.6 cm within SUV max of 13.4. Adjacent nodule measures 1.2 cm within SUV max of 2.28. Previously 0.7 cm within SUV max of 5.05. Thickened rind of increased pleural soft tissue overlying the posterior and lateral right base is intensely hypermetabolic and increased from previous exam. Within the right posterior medial costophrenic sulcus pleural tumor has a thickness of 2.5 cm within SUV max of 14.6. Measured at the same level on previous exam this measured 1 cm within SUV max of 7.1. Again noted are advanced changes of external beam radiation within the paramediastinal right lung including fibrosis and masslike architectural distortion. Similar appearance of nonspecific pneumonitis within the left upper lobe. Incidental CT findings: Moderate changes of emphysema. Aortic atherosclerosis identified. Calcifications within the LAD and left circumflex and RCA coronary arteries noted. ABDOMEN/PELVIS: No abnormal hypermetabolic activity within the liver, pancreas, adrenal glands, or spleen. No hypermetabolic lymph nodes in the abdomen or pelvis. Incidental CT  findings: Gallstones are identified within the gallbladder. Aortic atherosclerosis without evidence for aneurysm. SKELETON: No focal hypermetabolic activity to suggest skeletal metastasis. Incidental CT findings: none IMPRESSION: 1. Significant interval progression of hypermetabolic tumor within the chest. 2. The dominant mass within the right lower lobe has increased in size in the interval. There has also been progressive pleural spread of tumor overlying the posterior medial and lateral right lower lobe. 3. Interval progression of right paratracheal and subcarinal hypermetabolic adenopathy. 4. Aortic Atherosclerosis (ICD10-I70.0) and Emphysema (ICD10-J43.9). 5. LAD, left circumflex and RCA coronary artery atherosclerotic calcifications. Electronically Signed   By: Kerby Moors M.D.   On: 02/08/2018 22:18   Dg Chest Port 1 View  Result Date: 03/04/2018 CLINICAL DATA:  Acute respiratory failure. History of COPD, former smoker, lung malignancy. EXAM: PORTABLE CHEST 1 VIEW COMPARISON:  Portable chest x-ray of March 01, 2018 FINDINGS: The lungs are adequately inflated. Patchy airspace opacity in the left perihilar region persists but is slightly less conspicuous today. There is stable volume loss on the right with pleural thickening or pleural fluid overlying the superior and lateral aspects of the right lung. Increased density at the right lung base persists consistent with a known mass. The cardiac silhouette is enlarged. The pulmonary vascularity is mildly prominent. There is calcification in the wall of the aortic arch. The observed bony thorax is unremarkable. IMPRESSION: Persistent airspace opacity in the left perihilar region likely reflects pneumonia. Persistent volume loss on the right with increased mid and lower right hemithorax density consistent with pleural fluid and a known mass. Persistent cardiomegaly with slightly increased pulmonary vascular prominence consistent with mild CHF. Electronically  Signed   By: David  Martinique M.D.   On: 03/04/2018 07:20   Dg Chest Port 1 View  Result Date: 03/01/2018 CLINICAL DATA:  Acute respiratory failure. History of hypertension, lung cancer, COPD, PE, former smoker. EXAM: PORTABLE CHEST 1 VIEW COMPARISON:  Chest x-rays dated 02/27/2018 and 02/02/2018. FINDINGS: Cardiomegaly is stable. Overall cardiomediastinal silhouette is stable. Atherosclerotic changes again noted at the aortic arch. Stable opacity at the RIGHT lung base. New platelike opacity within the LEFT perihilar lung, pneumonia versus atelectasis. No pneumothorax seen. IMPRESSION: 1. Stable cardiomegaly. 2. Persistent opacity at the RIGHT lung base, compatible with the mass/consolidation demonstrated on recent chest CT of 02/27/2018. 3. New platelike opacity within the LEFT perihilar lung, pneumonia versus atelectasis, less likely asymmetric edema. Electronically Signed   By: Franki Cabot  M.D.   On: 03/01/2018 07:37   Dg Chest Port 1 View  Result Date: 02/27/2018 CLINICAL DATA:  72 year old female with large right lower lung mass, lung cancer. Shortness of breath. EXAM: PORTABLE CHEST 1 VIEW COMPARISON:  Chest CT and radiographs 01/31/2018. FINDINGS: Portable AP semi upright view at 0433 hours. Continued dense right lower lung and retrocardiac opacity corresponding to the lung mass/drowned lung. Mildly increased pulmonary vascularity but no overt edema. Increased left lung base opacity most resembling atelectasis. No pneumothorax. No large pleural effusion. Stable cardiac size and mediastinal contours. Visualized tracheal air column is within normal limits. IMPRESSION: 1. Increased left lung base atelectasis and pulmonary vascular congestion since 02/05/2018. 2. Continued dense right lower lung opacification due to lung mass and drowned lung. Electronically Signed   By: Genevie Ann M.D.   On: 02/27/2018 08:31   Dg Chest Port 1 View  Result Date: 02/03/2018 CLINICAL DATA:  Shortness of breath. EXAM:  PORTABLE CHEST 1 VIEW COMPARISON:  Chest CT 02/07/2018 FINDINGS: Cardiomediastinal silhouette is normal. Calcific atherosclerotic disease and tortuosity of the aorta. There is no evidence of pneumothorax. Similar appearance of right hemithorax with airspace opacity in the right lower lobe, likely representing tumor and diffuse peribronchovascular infiltrate versus lymphangitic spread of tumor. Interval development of right pleural effusion. Chronic interstitial lung changes versus metastatic disease in the left upper lobe of the lung also stable. Osseous structures are without acute abnormality. Soft tissues are grossly normal. IMPRESSION: Similar abnormal appearance of the lungs with airspace opacity likely representing the known pulmonary mass in the right lower lobe, extensive peribronchial airspace consolidation versus lymphangitic spread of tumor. Interval enlargement of right pleural effusion. Chronic interstitial lung scarring versus metastatic disease in the left upper lobe, stable. Electronically Signed   By: Fidela Salisbury M.D.   On: 02/01/2018 15:12    Labs:  CBC: Recent Labs    03/01/18 0336 03/02/18 0336 03/03/18 0344 03/04/18 0342  WBC 11.1* 10.4 10.2 12.1*  HGB 9.0* 9.5* 8.8* 9.1*  HCT 29.8* 31.2* 28.4* 29.8*  PLT 355 343 343 365    COAGS: Recent Labs    12/10/17 0635 02/26/18 0526  INR 1.00 1.16  APTT 30  --     BMP: Recent Labs    03/01/18 0336 03/02/18 0336 03/03/18 0344 03/04/18 0342  NA 135 133* 133* 137  K 5.1 5.1 4.6 4.6  CL 91* 93* 97* 101  CO2 33* 29 26 26   GLUCOSE 99 100* 111* 123*  BUN 43* 48* 41* 35*  CALCIUM 9.1 9.1 8.5* 8.6*  CREATININE 0.98 0.86 0.65 0.69  GFRNONAA 57* >60 >60 >60  GFRAA >60 >60 >60 >60    LIVER FUNCTION TESTS: Recent Labs    01/27/18 1252 02/19/18 1213 02/11/2018 1454 02/25/18 0515  BILITOT 0.6 0.7 0.6 0.8  AST 33 19 18 14*  ALT 38 32 24 22  ALKPHOS 116 97* 95 84  PROT 7.8 7.6 7.8 7.1  ALBUMIN 2.7* 2.9* 3.0*  2.6*    TUMOR MARKERS: No results for input(s): AFPTM, CEA, CA199, CHROMGRNA in the last 8760 hours.  Assessment and Plan:  Squamous cell carcinoma of lung. Plan for image-guided Port-a-cath insertion today with Dr. Barbie Banner. Patient is NPO- she last ate graham crackers at 1000 this am, plan for procedure after 1600 today. Denies fever. She does not take blood thinners. INR 1.16 seconds on 02/26/2018.  Risks and benefits of image guided port-a-catheter placement was discussed with the patient including, but not limited to  bleeding, infection, pneumothorax, or fibrin sheath development and need for additional procedures. All of the patient's questions were answered, patient is agreeable to proceed. Consent signed and in chart.  Thank you for this interesting consult.  I greatly enjoyed meeting [facility] and look forward to participating in their care.  A copy of this report was sent to the requesting provider on this date.  Electronically Signed: Earley Abide, PA-C 03/04/2018, 11:03 AM   I spent a total of 20 Minutes in face to face in clinical consultation, greater than 50% of which was counseling/coordinating care for squamous cell cancer of lung.

## 2018-03-04 NOTE — Progress Notes (Addendum)
PT Cancellation Note  Patient Details Name: Alisha Fisher MRN: [id number] DOB: [birth year]   Cancelled Treatment:    Reason Eval/Treat Not Completed: Medical issues which prohibited therapy(pt is nauseous and is having pain from urinary catheter.  Pt is receiving anit-nausea medication from RN. Will follow. ) Please refer to PT Evaluation 02/25/18 for recommendations.    Blondell Reveal Kistler 03/04/2018, 10:11 AM [phone]

## 2018-03-05 ENCOUNTER — Encounter (HOSPITAL_COMMUNITY): Payer: Self-pay | Admitting: Interventional Radiology

## 2018-03-05 ENCOUNTER — Inpatient Hospital Stay (HOSPITAL_COMMUNITY): Payer: Medicare Other

## 2018-03-05 HISTORY — PX: IR FLUORO GUIDE PORT INSERTION RIGHT: IMG5741

## 2018-03-05 HISTORY — PX: IR US GUIDE VASC ACCESS RIGHT: IMG2390

## 2018-03-05 LAB — CBC WITH DIFFERENTIAL/PLATELET
Basophils Absolute: 0 10*3/uL (ref 0.0–0.1)
Basophils Relative: 0 %
EOS ABS: 0 10*3/uL (ref 0.0–0.7)
Eosinophils Relative: 0 %
HEMATOCRIT: 31.8 % — AB (ref 36.0–46.0)
HEMOGLOBIN: 9.9 g/dL — AB (ref 12.0–15.0)
LYMPHS ABS: 0.7 10*3/uL (ref 0.7–4.0)
Lymphocytes Relative: 5 %
MCH: 29.1 pg (ref 26.0–34.0)
MCHC: 31.1 g/dL (ref 30.0–36.0)
MCV: 93.5 fL (ref 78.0–100.0)
MONO ABS: 0.9 10*3/uL (ref 0.1–1.0)
MONOS PCT: 6 %
NEUTROS PCT: 89 %
Neutro Abs: 12.8 10*3/uL — ABNORMAL HIGH (ref 1.7–7.7)
Platelets: 360 10*3/uL (ref 150–400)
RBC: 3.4 MIL/uL — ABNORMAL LOW (ref 3.87–5.11)
RDW: 15.8 % — AB (ref 11.5–15.5)
WBC: 14.5 10*3/uL — ABNORMAL HIGH (ref 4.0–10.5)

## 2018-03-05 LAB — BASIC METABOLIC PANEL
Anion gap: 8 (ref 5–15)
BUN: 41 mg/dL — AB (ref 6–20)
CHLORIDE: 97 mmol/L — AB (ref 101–111)
CO2: 32 mmol/L (ref 22–32)
CREATININE: 0.75 mg/dL (ref 0.44–1.00)
Calcium: 8.8 mg/dL — ABNORMAL LOW (ref 8.9–10.3)
GFR calc Af Amer: >60 mL/min (ref >60)
GFR calc non Af Amer: >60 mL/min (ref >60)
Glucose, Bld: 88 mg/dL (ref 65–99)
Potassium: 4.1 mmol/L (ref 3.5–5.1)
SODIUM: 137 mmol/L (ref 135–145)

## 2018-03-05 MED ORDER — LIDOCAINE HCL 1 % IJ SOLN
INTRAMUSCULAR | Status: AC
  Filled 2018-03-05: qty 20

## 2018-03-05 MED ORDER — MIDAZOLAM HCL 2 MG/2ML IJ SOLN
INTRAMUSCULAR | Status: AC | PRN
  Administered 2018-03-05: 0.5 mg via INTRAVENOUS
  Administered 2018-03-05: 1 mg via INTRAVENOUS

## 2018-03-05 MED ORDER — FENTANYL CITRATE (PF) 100 MCG/2ML IJ SOLN
INTRAMUSCULAR | Status: AC | PRN
  Administered 2018-03-05: 25 ug via INTRAVENOUS
  Administered 2018-03-05: 50 ug via INTRAVENOUS

## 2018-03-05 MED ORDER — CLONIDINE HCL 0.1 MG PO TABS: 0.1000 mg | ORAL_TABLET | Freq: Every day | ORAL | Status: DC

## 2018-03-05 MED ORDER — MIDAZOLAM HCL 2 MG/2ML IJ SOLN
INTRAMUSCULAR | Status: AC
  Filled 2018-03-05: qty 4

## 2018-03-05 MED ORDER — LIDOCAINE-EPINEPHRINE (PF) 2 %-1:200000 IJ SOLN
INTRAMUSCULAR | Status: AC
  Filled 2018-03-05: qty 20

## 2018-03-05 MED ORDER — CLINDAMYCIN PHOSPHATE 900 MG/50ML IV SOLN
INTRAVENOUS | Status: AC
  Filled 2018-03-05: qty 50

## 2018-03-05 MED ORDER — LIDOCAINE HCL (PF) 1 % IJ SOLN
INTRAMUSCULAR | Status: AC | PRN
  Administered 2018-03-05: 10 mL

## 2018-03-05 MED ORDER — FENTANYL CITRATE (PF) 100 MCG/2ML IJ SOLN
INTRAMUSCULAR | Status: AC
  Filled 2018-03-05: qty 4

## 2018-03-05 MED ORDER — HEPARIN SOD (PORK) LOCK FLUSH 100 UNIT/ML IV SOLN
INTRAVENOUS | Status: AC
  Filled 2018-03-05: qty 5

## 2018-03-05 MED ORDER — FUROSEMIDE 40 MG PO TABS
40.0000 mg | ORAL_TABLET | Freq: Every day | ORAL | Status: DC
  Administered 2018-03-06 – 2018-03-15 (×9): 40 mg via ORAL
  Filled 2018-03-05 (×9): qty 1

## 2018-03-05 MED ORDER — CLINDAMYCIN PHOSPHATE 900 MG/50ML IV SOLN
900.0000 mg | Freq: Once | INTRAVENOUS | Status: AC
  Administered 2018-03-05: 900 mg via INTRAVENOUS

## 2018-03-05 MED ORDER — LEVALBUTEROL HCL 0.63 MG/3ML IN NEBU
0.6300 mg | INHALATION_SOLUTION | Freq: Four times a day (QID) | RESPIRATORY_TRACT | Status: DC
  Administered 2018-03-05 (×2): 0.63 mg via RESPIRATORY_TRACT
  Filled 2018-03-05 (×2): qty 3

## 2018-03-05 MED ORDER — LEVALBUTEROL HCL 0.63 MG/3ML IN NEBU
0.6300 mg | INHALATION_SOLUTION | Freq: Three times a day (TID) | RESPIRATORY_TRACT | Status: DC
  Administered 2018-03-06 – 2018-03-13 (×20): 0.63 mg via RESPIRATORY_TRACT
  Filled 2018-03-05 (×22): qty 3

## 2018-03-05 NOTE — Progress Notes (Signed)
SLP Cancellation Note  Patient Details Name: Genesia Caslin MRN: [id number] DOB: [birth year]   Cancelled treatment:       Reason Eval/Treat Not Completed: Patient at procedure or test/unavailable   Macario Golds 03/05/2018, [place], [facility] SLP [phone]

## 2018-03-05 NOTE — Progress Notes (Signed)
CSW following for SNF placement. CSW will provide bed offers closer to patient d/c date.  Kathrin Greathouse, Latanya Presser, MSW Clinical Social Worker  [phone] 03/05/2018  10:30 AM

## 2018-03-05 NOTE — Procedures (Signed)
Lung ca  S/p RT IJ POWER PORT  Tip svcra No comp Stable EBL 0 Ready and accessed  Full report in pacs

## 2018-03-05 NOTE — Progress Notes (Signed)
Pt found off bipap.  Pt was placed on nasal cannula by RN during bath.  Pt requests a break from bipap at this time.  RN aware.  RT will monitor and assist as needed.

## 2018-03-05 NOTE — Progress Notes (Addendum)
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  She had a bronchoscopy in March 2019 which confirmed suspicion of recurrent squamous cell carcinoma and then she started treatment with immunotherapy (Pembrolizumab) in May 2019.  She has received 1 treatment.  She presented to [facility] on Feb 24, 2018 complaining of 4 to 5 days of chest congestion and lethargy and shortness of breath.  A CT scan performed after admission showed persistent right upper lobe radiation fibrosis, and what appears to be significant growth of the right lower lobe mass with pleural involvement versus consolidation and pneumonia.  She was admitted by the hospitalist service and has been receiving treatment for a COPD exacerbation, healthcare associated pneumonia, and has received some diuretic therapy.  On 5/30 she was transferred to the intensive care unit because of increasing work of breathing and lethargy.  She was noted to be hypercarbic.  She was started on noninvasive mechanical ventilation after arrival.  Shortly after starting on that treatment her anxiety dramatically worsened and so she was given Ativan.  Pulmonary and critical care medicine was consulted because of the patient's worsening respiratory status and a continued FULL CODE STATUS.  Since arrival to the intensive care unit she has become less anxious and is more compliant with NIMV. On 5/30, CT Chest revealed a type A Thoracic aortic dissection of which she is not a surgical candidate. Pt and family have since decided on DNR status.  ->has very poor functional status and limited activity status at  baseline for at least the last year leading up to this.     SIGNIFICANT EVENTS: 5/27>>Admssion to [facility] 5/30>> CT reveals Type A aortic dissection 5/31 - On Bipap, 40%FiO2 Repeat CT chest w/ angiogram ordered on 5/30 by primary service incidentally found: suspected type A thoracic aortic dissection extending down to aortic root.  Esmolol gtt infusing; not a surgical candidate family made aware not likely to survive  Remains lethargic     SUBJECTIVE/OVERNIGHT/INTERVAL HX On 4L [place], was on Bipap overnight Complains of discomfort in arms due to swelling Nursing with concerns of pt coughing with eating/drinking  VITAL SIGNS: BP (!) 105/33   Pulse (!) 50   Temp (!) 97.4 F (36.3 C) (Oral)   Resp 10   Ht 5\' 7"  (1.702 m)   Wt 230 lb (104.3 kg)   SpO2 97%   BMI 36.02 kg/m   HEMODYNAMICS:    VENTILATOR SETTINGS: FiO2 (%):  [35 %] 35 %  INTAKE / OUTPUT: I/O last 3 completed shifts: In: 2310.8 [P.O.:120; I.V.:2190.8] Out: 2980 [Urine:2980]  PHYSICAL EXAMINATION: GENERAL: Chronically-ill appearing obese female, laying in bed, on 4L [place], comfortable, in no acute distress HEENT: Atraumatic, normocephalic, neck supple, pupils PERRL CARDIAC: RRR, s1s2 noted, No M/R/G LUNGS: Mild expiratory wheezing throughout upon auscultation, Crackles to RLL, regular, even, non-labored, no assessory muscle use ABDOMEN: Obese, soft, non-tender, BS+ x4 EXTREMITIES: No deformities, 2+ bilateral UE edema SKIN: Warm, dry.  No obvious rashes, lesions, or ulcerations NEURO: Awake, A&O, calm, follows commands  PULMONARY Recent Labs  Lab 02/27/18 1100 03/02/18 1035  PHART 7.244* 7.341*  PCO2ART 88.7* 60.2*  PO2ART 73.2* 78.1*  HCO3 37.0* 31.7*  O2SAT 92.8 94.2    CBC Recent Labs  Lab 03/03/18 0344 03/04/18 0342 03/05/18 0320  HGB 8.8* 9.1* 9.9*  HCT 28.4* 29.8* 31.8*  WBC 10.2 12.1* 14.5*  PLT 343 365 360    COAGULATION No results for input(s): INR  in the last 168 hours.  CARDIAC  No results for input(s): TROPONINI in the last 168 hours. No results for input(s): PROBNP in the last 168 hours.   CHEMISTRY Recent Labs  Lab 03/01/18 0336 03/02/18 0336 03/03/18 0344 03/04/18 0342 03/05/18 0320  NA 135 133* 133* 137 137  K 5.1 5.1 4.6 4.6 4.1  CL 91* 93* 97* 101 97*  CO2 33* 29 26 26  32  GLUCOSE 99 100* 111* 123* 88  BUN 43* 48* 41* 35* 41*  CREATININE 0.98 0.86 0.65 0.69 0.75  CALCIUM 9.1 9.1 8.5* 8.6* 8.8*   Estimated Creatinine Clearance: 81.3 mL/min (by C-G formula based on SCr of 0.75 mg/dL).   LIVER No results for input(s): AST, ALT, ALKPHOS, BILITOT, PROT, ALBUMIN, INR in the last 168 hours.   INFECTIOUS No results for input(s): LATICACIDVEN, PROCALCITON in the last 168 hours.   ENDOCRINE CBG (last 3)  No results for input(s): GLUCAP in the last 72 hours.   ASSESSMENT / PLAN:  PULMONARY A: Acute on Chronic Respiratory Failure with Hypercarbia Acute Pulmonary edema Right Lung tumor Possible right Lung Pleural Effusion (not clear from CT imaging) Severe COPD ? CAP Morbid obesity with chronic hypercarbia, likely obesity hypoventilation syndrome Hx: Sleep apnea, COPD, Lung cancer   Plan Supplemental O2 to maintain O2 sats 88 to 94% Bipap QHS Continue Bronchodilator: Xopenex TID and prn Continue ICS: Pulmicort BID Steroids weaned off 6/4 Completed full course of Abx for CAP (completed 6/3) Lasix 40mg  IV today x1 dose, transition to PO lasix daily on 6/6 F/u CXR prn Oncology following, appreciate input Palliative care following, appreciate input   Type A Thoracic Aortic Dissection (4.7 cm)-->NOT a surgical candidate  HTN  ECHO 5/31 - Ef 55% . Elevated LVEDP +, PASP 43  Plan:  Telemetry monitoring Goal SBP <140 Cardene & Esmolol gtt's weaned off Continue Metoprolol & Norvasc for BP control 6/5 - Final dose IV lasix, transition to PO Lasix on 6/6 Follow urine output Follow BMP with  diuresis  PT consulted - Mobilize OOB as able VTE prophylaxis: SCD's Nutrition: Heart healthy, carb modified diet Goals of Care: DNR SUP: Protonix  Family update: No family at bedside for update in am 6/5.   Nursing with concerns of pt coughing with drinking/eating.  Will obtain swallow evaluation with speech therapy.  Pt is stable from pulmonary standpoint, PCCM will sign off today.  Please re-consult if any further issues arise.  Thank you for the opportunity to assist in the pt's care.   Darel Hong, AGACNP-BC [facility] Pager: [phone], If no answer or between  15:00h - 7:00h: call [phone] Telephone: [phone]

## 2018-03-05 NOTE — Progress Notes (Signed)
PROGRESS NOTE    Alisha Fisher  ZOX:[id number] DOB: 04-Jul-1946 DOA: 02/02/2018 PCP: [name], MD     Brief Narrative:  Alisha Fisher is a 72 year old female with medical history significant for locally advanced squamous cell carcinoma of the lung, COPD on home oxygen 3 L/min, wheelchair-bound, morbid obesity, OSA, diastolic heart failure, chronic iron deficiency anemia, hypertension presented to the ED with a 5-day history of malaise, worsening cough/shortness of breath.  In the ED, patient was found to be hypoxic, febrile and tachycardic.  Patient admitted for further management. She required BiPAP during hospitalization due to acute on chronic respiratory failure, COPD exacerbation, OHS, pulmonary edema. PCCM was consulted. She was also found to have type A thoracic aortic dissection and was deemed not to be a surgical candidate. She was managed with IV esmolol and cardene gtt for BP control.   New events last 24 hours / Subjective: She has been using BiPAP overnight, but did not require it much during the day yesterday. States her breathing has improved. Denies any chest pain.   Assessment & Plan:   Principal Problem:   Acute on chronic respiratory failure with hypercapnia (HCC) Active Problems:   Obesity   COPD (chronic obstructive pulmonary disease) (HCC)   Essential hypertension   Squamous cell lung cancer, right (HCC)   Chronic diastolic heart failure (HCC)   OSA (obstructive sleep apnea)   Iron deficiency anemia   Anxiety disorder   Goals of care, counseling/discussion   COPD with acute exacerbation (HCC)   Pleural effusion   Community acquired pneumonia   Community acquired pneumonia of right lower lobe of lung ([place])   AKI (acute kidney injury) ([place])   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 Continue PO metoprolol, norvasc -Goal SBP 100-120  ??Post-obstructive pneumonia/radiation fibrosis -Procalcitonin trended down to 0.34 -Urine strep pneumo, Legionella both negative -Blood culture x2 negative  -Chest x-ray: Extensive peribronchial airspace consolidation versus lymphangitic spread of tumor, repeat with now worsening vascular congestion -CT chest: Continued presence of large right lower lobe mass with pleural spread is noted consistent with history of lung cancer. 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 is well -Completed IV ceftriaxone and azithromycin X 7 days  AKI -Resolved  Iron deficiency/anemia of chronic disease -Baseline hemoglobin between 10-11 -Status post IV iron on 02/26/2018  Squamous cell carcinoma of the right lung -Oncology following, on Keytruda (oncology considering switching to chemo) -Port-a-cath planned today   Acute on chronic diastolic heart failure -Echo showed EF of 65 to 81%, grade 2 diastolic dysfunction -Continue Lasix   Obstructive sleep apnea -On BiPAP scheduled at night -Has declined outpatient sleep study in the past   GERD -Continue Protonix  Deconditioning/GOC discussion -Palliative consulted    DVT prophylaxis: SCD Code Status: DNR Family Communication: No family at bedside Disposition Plan: Pending stabilization of respiratory status, BP control    Consultants:   PCCM  Oncology  Palliative care   Procedures:   None   Antimicrobials:  Anti-infectives (From  admission, onward)   Start      Dose/Rate Route Frequency Ordered Stop   03/04/18 1639  vancomycin (VANCOCIN) 1-5 GM/200ML-% IVPB    Note to Pharmacy:  Lesia Hausen   : cabinet override      03/04/18 1639 03/05/18 0444   03/04/18 1622  ceFAZolin (ANCEF) 2-4 GM/100ML-% IVPB  Status:  Discontinued    Note to Pharmacy:  Lesia Hausen   : cabinet override      03/04/18 1622 03/04/18 1638   02/28/18 1200  cefTRIAXone (ROCEPHIN) 2 g in sodium chloride 0.9 % 100 mL IVPB     2 g 200 mL/hr over 30 Minutes Intravenous Every 24 hours 02/27/18 1339 03/03/18 1408   02/25/18 1600  vancomycin (VANCOCIN) 1,250 mg in sodium chloride 0.9 % 250 mL IVPB  Status:  Discontinued     1,250 mg 166.7 mL/hr over 90 Minutes Intravenous Every 24 hours 02/15/2018 1711 02/25/18 0801   02/25/18 1200  cefTRIAXone (ROCEPHIN) 1 g in sodium chloride 0.9 % 100 mL IVPB  Status:  Discontinued     1 g 200 mL/hr over 30 Minutes Intravenous Every 24 hours 02/25/18 0927 02/27/18 1339   02/25/18 1200  azithromycin (ZITHROMAX) 500 mg in sodium chloride 0.9 % 250 mL IVPB     500 mg 250 mL/hr over 60 Minutes Intravenous Every 24 hours 02/25/18 0927 03/03/18 1514   02/15/2018 2200  aztreonam (AZACTAM) 2 g in sodium chloride 0.9 % 100 mL IVPB  Status:  Discontinued     2 g 200 mL/hr over 30 Minutes Intravenous Every 8 hours 02/13/2018 1651 02/25/18 0927   02/05/2018 1515  aztreonam (AZACTAM) 2 g in sodium chloride 0.9 % 100 mL IVPB     2 g 200 mL/hr over 30 Minutes Intravenous  Once 02/14/2018 1508 02/14/2018 1621   01/29/2018 1515  vancomycin (VANCOCIN) 2,000 mg in sodium chloride 0.9 % 500 mL IVPB     2,000 mg 250 mL/hr over 120 Minutes Intravenous  Once 02/23/2018 1509 02/08/2018 1821       Objective: Vitals:   03/05/18 0600 03/05/18 0700 03/05/18 0800 03/05/18 0900  BP: (!) 112/32 (!) 105/33 (!) 138/41 (!) 117/41  Pulse: (!) 50 (!) 50 60 65  Resp: 12 10 14 14   Temp:   98.1 F (36.7 C)   TempSrc:   Oral   SpO2: 99% 97% 99% 95%  Weight:      Height:         Intake/Output Summary (Last 24 hours) at 03/05/2018 0926 Last data filed at 03/05/2018 0800 Gross per 24 hour  Intake 79.77 ml  Output 2275 ml  Net -2195.23 ml   Filed Weights   02/27/2018 1437  Weight: 104.3 kg (230 lb)    Examination:  General exam: Appears calm and comfortable  Respiratory system: Expiratory wheezes bilaterally. Respiratory effort normal. On [place] O2 Cardiovascular system: S1 & S2 heard, RRR. No JVD, murmurs, rubs, gallops or clicks. No pedal edema. Gastrointestinal system: Abdomen is nondistended, soft and nontender. No organomegaly or masses felt. Normal bowel sounds heard. Central nervous system: Alert and oriented. No focal neurological deficits. Extremities: Symmetric 5 x 5 power. Skin: No rashes, lesions or ulcers Psychiatry: Judgement and insight appear normal. Mood & affect appropriate.   Data Reviewed: I have personally reviewed following labs and imaging studies  CBC: Recent Labs  Lab 03/01/18 0336 03/02/18 0336 03/03/18 0344 03/04/18 0342 03/05/18 0320  WBC 11.1* 10.4 10.2 12.1* 14.5*  NEUTROABS 10.6* 9.7* 9.7*  11.5* 12.8*  HGB 9.0* 9.5* 8.8* 9.1* 9.9*  HCT 29.8* 31.2* 28.4* 29.8* 31.8*  MCV 93.7 93.7 92.8 92.8 93.5  PLT 355 343 343 365 258   Basic Metabolic Panel: Recent Labs  Lab 03/01/18 0336 03/02/18 0336 03/03/18 0344 03/04/18 0342 03/05/18 0320  NA 135 133* 133* 137 137  K 5.1 5.1 4.6 4.6 4.1  CL 91* 93* 97* 101 97*  CO2 33* 29 26 26  32  GLUCOSE 99 100* 111* 123* 88  BUN 43* 48* 41* 35* 41*  CREATININE 0.98 0.86 0.65 0.69 0.75  CALCIUM 9.1 9.1 8.5* 8.6* 8.8*   GFR: Estimated Creatinine Clearance: 81.3 mL/min (by C-G formula based on SCr of 0.75 mg/dL). Liver Function Tests: No results for input(s): AST, ALT, ALKPHOS, BILITOT, PROT, ALBUMIN in the last 168 hours. No results for input(s): LIPASE, AMYLASE in the last 168 hours. No results for input(s): AMMONIA in the last 168 hours. Coagulation Profile: No results for  input(s): INR, PROTIME in the last 168 hours. Cardiac Enzymes: No results for input(s): CKTOTAL, CKMB, CKMBINDEX, TROPONINI in the last 168 hours. BNP (last 3 results) No results for input(s): PROBNP in the last 8760 hours. HbA1C: No results for input(s): HGBA1C in the last 72 hours. CBG: Recent Labs  Lab 02/27/18 1106  GLUCAP 139*   Lipid Profile: No results for input(s): CHOL, HDL, LDLCALC, TRIG, CHOLHDL, LDLDIRECT in the last 72 hours. Thyroid Function Tests: No results for input(s): TSH, T4TOTAL, FREET4, T3FREE, THYROIDAB in the last 72 hours. Anemia Panel: No results for input(s): VITAMINB12, FOLATE, FERRITIN, TIBC, IRON, RETICCTPCT in the last 72 hours. Sepsis Labs: No results for input(s): PROCALCITON, LATICACIDVEN in the last 168 hours.  Recent Results (from the past 240 hour(s))  Blood Culture (routine x 2)     Status: None   Collection Time: 01/29/2018  2:47 PM  Result Value Ref Range Status   Specimen Description   Final    BLOOD RIGHT ANTECUBITAL Performed at [facility] [address], [place], [place] [address]    Special Requests   Final    BOTTLES DRAWN AEROBIC AND ANAEROBIC Blood Culture adequate volume Performed at [facility] [address] [place], [place] [address]    Culture   Final    NO GROWTH 5 DAYS Performed at [facility], [place] [address], [place], [place] [address]    Report Status 03/01/2018 FINAL  Final  Blood Culture (routine x 2)     Status: None   Collection Time: 02/28/2018  2:48 PM  Result Value Ref Range Status   Specimen Description   Final    BLOOD LEFT FOREARM Performed at [facility] [address], [place], [place] [address]    Special Requests   Final    BOTTLES DRAWN AEROBIC AND ANAEROBIC Blood Culture adequate volume Performed at [facility] [address] [place], [place] [address]    Culture   Final    NO GROWTH 5 DAYS Performed at  [facility], [place] [address], [place], [place] [address]    Report Status 03/01/2018 FINAL  Final  MRSA PCR Screening     Status: None   Collection Time: 02/03/2018  4:50 PM  Result Value Ref Range Status   MRSA by PCR NEGATIVE NEGATIVE Final    Comment:        The GeneXpert MRSA Assay (FDA approved for NASAL specimens only), is one component of a  comprehensive MRSA colonization surveillance program. It is not intended to diagnose MRSA infection nor to guide or monitor treatment for MRSA infections. Performed at [facility], [place] [address], [place], [place] [address]   Culture, sputum-assessment     Status: None   Collection Time: 02/26/18  4:40 AM  Result Value Ref Range Status   Specimen Description SPUTUM  Final   Special Requests NONE  Final   Sputum evaluation   Final    Sputum specimen not acceptable for testing.  Please recollect.   [place] 4[address] 02/26/18 A NAVARRO Performed at [facility] [address] [place], [place] [address]    Report Status 02/26/2018 FINAL  Final  Urine Culture     Status: None   Collection Time: 02/26/18  5:45 PM  Result Value Ref Range Status   Specimen Description   Final    URINE, RANDOM Performed at [facility] [address] [place], [place] [address]    Special Requests   Final    NONE Performed at [facility], [place] [address], [place], [place] [address]    Culture   Final    NO GROWTH Performed at [facility], [place] [address] [place],  [address]    Report Status 02/28/2018 FINAL  Final       Radiology Studies: Dg Chest Port 1 View  Result Date: 03/04/2018 CLINICAL DATA:  Acute respiratory failure. History of COPD, former smoker, lung malignancy. EXAM: PORTABLE CHEST 1 VIEW COMPARISON:  Portable chest x-ray of March 01, 2018 FINDINGS: The lungs are adequately inflated. Patchy airspace opacity in the left perihilar  region persists but is slightly less conspicuous today. There is stable volume loss on the right with pleural thickening or pleural fluid overlying the superior and lateral aspects of the right lung. Increased density at the right lung base persists consistent with a known mass. The cardiac silhouette is enlarged. The pulmonary vascularity is mildly prominent. There is calcification in the wall of the aortic arch. The observed bony thorax is unremarkable. IMPRESSION: Persistent airspace opacity in the left perihilar region likely reflects pneumonia. Persistent volume loss on the right with increased mid and lower right hemithorax density consistent with pleural fluid and a known mass. Persistent cardiomegaly with slightly increased pulmonary vascular prominence consistent with mild CHF. Electronically Signed   By: David  Martinique M.D.   On: 03/04/2018 07:20      Scheduled Meds: . amLODipine  10 mg Oral Daily  . budesonide (PULMICORT) nebulizer solution  0.5 mg Nebulization BID  . dextromethorphan-guaiFENesin  1 tablet Oral BID  . furosemide  40 mg Intravenous Daily  . [START ON 03/06/2018] furosemide  40 mg Oral Daily  . levalbuterol  0.63 mg Nebulization Q4H  . metoprolol succinate  150 mg Oral Daily  . pantoprazole  40 mg Oral BID  . sodium chloride flush  10-40 mL Intracatheter Q12H   Continuous Infusions: . esmolol Stopped (03/04/18 2059)  . niCARDipine Stopped (03/04/18 2101)     LOS: 9 days    Time spent: 40 minutes   Dessa Phi, DO [facility] [email or web address] Password [email or web address] 03/05/2018, 9:26 AM

## 2018-03-05 NOTE — Progress Notes (Signed)
Alisha Fisher is about the same from my perspective.  She is on the BiPAP.  It looks like she might be going to a skilled nursing facility.  Hopefully, this pneumonia is clearing up.  I did have a talk with her today.  I told her that if we are going to do any further treatment, a Port-A-Cath would be helpful.  Hopefully, she will be able to get one put in.  As far as her lung cancer, treatment for this is still unclear.  I would like to treat with immunotherapy.  However, if she is going to be on steroids, we might not be able to do therapy.  Her labs look okay.  Thankfully, her hemoglobin is up to 9.9.  Platelet count 360,000.  Her creatinine is 0.75.  Calcium is 8.8.  There really is no change in her physical exam.  We will continue to follow along.  I appreciate the outstanding care that she is getting from all the staff in the ICU.  Lattie [place], MD  [place] [phone]

## 2018-03-05 NOTE — Sedation Documentation (Signed)
Patient is resting comfortably with eyes closed in NAD. 

## 2018-03-06 ENCOUNTER — Inpatient Hospital Stay (HOSPITAL_COMMUNITY): Payer: Medicare Other

## 2018-03-06 DIAGNOSIS — I509 Heart failure, unspecified: Secondary | ICD-10-CM

## 2018-03-06 LAB — CBC WITH DIFFERENTIAL/PLATELET
BASOS ABS: 0 10*3/uL (ref 0.0–0.1)
BASOS PCT: 0 %
EOS ABS: 0.1 10*3/uL (ref 0.0–0.7)
EOS PCT: 0 %
HCT: 32 % — ABNORMAL LOW (ref 36.0–46.0)
Hemoglobin: 9.8 g/dL — ABNORMAL LOW (ref 12.0–15.0)
Lymphocytes Relative: 3 %
Lymphs Abs: 0.6 10*3/uL — ABNORMAL LOW (ref 0.7–4.0)
MCH: 29.1 pg (ref 26.0–34.0)
MCHC: 30.6 g/dL (ref 30.0–36.0)
MCV: 95 fL (ref 78.0–100.0)
MONO ABS: 0.8 10*3/uL (ref 0.1–1.0)
Monocytes Relative: 4 %
Neutro Abs: 17.3 10*3/uL — ABNORMAL HIGH (ref 1.7–7.7)
Neutrophils Relative %: 93 %
PLATELETS: 248 10*3/uL (ref 150–400)
RBC: 3.37 MIL/uL — ABNORMAL LOW (ref 3.87–5.11)
RDW: 15.9 % — AB (ref 11.5–15.5)
WBC: 18.8 10*3/uL — ABNORMAL HIGH (ref 4.0–10.5)

## 2018-03-06 LAB — BASIC METABOLIC PANEL
Anion gap: 5 (ref 5–15)
BUN: 30 mg/dL — AB (ref 6–20)
CALCIUM: 8.3 mg/dL — AB (ref 8.9–10.3)
CO2: 37 mmol/L — ABNORMAL HIGH (ref 22–32)
CREATININE: 0.59 mg/dL (ref 0.44–1.00)
Chloride: 95 mmol/L — ABNORMAL LOW (ref 101–111)
GFR calc Af Amer: >60 mL/min (ref >60)
GLUCOSE: 87 mg/dL (ref 65–99)
Potassium: 3.5 mmol/L (ref 3.5–5.1)
SODIUM: 137 mmol/L (ref 135–145)

## 2018-03-06 NOTE — Progress Notes (Signed)
Patient ID: Alisha Fisher, female   DOB: 19-Apr-1946, 72 y.o.   MRN: [id number]                                                                PROGRESS NOTE                                                                                                                                                                                                             Patient Demographics:    Alisha Fisher, is a 72 y.o. female, DOB - May 16, 1946, PPI:[id number]  Admit date - 02/27/2018   Admitting Physician Edwin Dada, MD  Outpatient Primary MD for the patient is [name], MD  LOS - 10  Outpatient Specialists:     Chief Complaint  Patient presents with  . 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 In the ED, patient was found to be hypoxic, febrile and tachycardic. Patient admitted for further management. She required BiPAP during hospitalization due to acute on chronic respiratory failure, COPD exacerbation, OHS, pulmonary edema. PCCM was consulted. She was also found to have type A thoracic aortic dissection and was deemed not to be a surgical candidate. She was managed with IV esmolol and cardene gtt for BP control.      Subjective:    Alisha Fisher today states she is feeling better.  Per RN sbp sometimes in the low 140's but improves when patient is resting.  Transitioning to PO lasix today, pt has leukocytosis. But afebrile.   No headache, No chest pain, No abdominal pain - No Nausea, No new weakness tingling or numbness, No Cough - SOB.    Assessment  & Plan :    Principal Problem:   Acute on chronic respiratory failure with hypercapnia  (HCC) Active Problems:   Obesity   COPD (chronic obstructive pulmonary disease) (HCC)   Essential hypertension   Squamous cell lung cancer, right (HCC)   Chronic diastolic heart failure (HCC)   OSA (obstructive sleep apnea)  Iron deficiency anemia   Anxiety disorder   Goals of care, counseling/discussion   COPD with acute exacerbation (HCC)   Pleural effusion   Community acquired pneumonia   Community acquired pneumonia of right lower lobe of lung ([place])   AKI (acute kidney injury) ([place])   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[place])    Acute on chronic hypoxic and hypercapnic respiratory failure/COPD exacerbation/Obesity hypoventilation syndrome/Acute pulmonary edema/CAP -Currentlyweaned off bipap, will need qhs  -ABG with improved hypercarbia -PCCM consulted, appreciate recs -Continue duonebs/inhaler, steroid tapered off 6/4, completed IV antibiotics  -Transition to Lasix 40mg  po qday (03/06/2018)  Type A Thoracic Aortic dissection -CT showed type A thoracic aortic dissection, 4.7cm -Not a surgical candidate, strict BP/HR control -ECHO showed: EF of 55-60%, PA peak pressure of 32mmhg -Weaned off esmolol and cardene gtt. Continue PO metoprolol, norvasc -Goal SBP 100-120  Leukocytosis ? Steroids vs infection Currently afebrile, will monitor CXR pending Repeat cbc in am  ??Post-obstructive pneumonia/radiation fibrosis -Procalcitonin trendeddown to 0.34 -Urine strep pneumo, Legionella both negative -Blood culture x2 negative  -Chest x-ray: Extensive peribronchial airspace consolidation versus lymphangitic spread of tumor, repeat with now worsening vascular congestion -CT chest: Continued presence of large right lower lobe mass with pleural spread is noted consistent with history of lung cancer. 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 is well -CompletedIV ceftriaxone and azithromycin X 7 days  AKI -Resolved  Iron deficiency/anemia of chronic disease -Baseline  hemoglobin between 10-11 -Status post IV iron on 02/26/2018  Squamous cell carcinoma of the right lung -Oncology following, on Keytruda (oncology considering switching to chemo) -Port-a-cath planned today  -appreciate oncology input  Acute on chronic diastolic heart failure -Echo showed EF of 65 to 44%, grade 2 diastolic dysfunction -Continue Lasix   Obstructive sleep apnea -On BiPAP scheduled at night -Has declined outpatient sleep study in the past   GERD -Continue Protonix  Deconditioning/GOC discussion -Palliative consulted    DVT prophylaxis: SCD Code Status: DNR Family Communication: No family at bedside Disposition Plan: Pending stabilization of respiratory status, BP control    Consultants:   PCCM  Oncology  Palliative care   Procedures:   None     Lab Results  Component Value Date   PLT 248 03/06/2018      Anti-infectives (From admission, onward)   Start     Dose/Rate Route Frequency Ordered Stop   03/05/18 1503  clindamycin (CLEOCIN) 900 MG/50ML IVPB    Note to Pharmacy:  Sherle Poe   : cabinet override      03/05/18 1503 03/06/18 0314   03/05/18 1415  clindamycin (CLEOCIN) IVPB 900 mg     900 mg 100 mL/hr over 30 Minutes Intravenous  Once 03/05/18 1403 03/05/18 1554   03/04/18 1639  vancomycin (VANCOCIN) 1-5 GM/200ML-% IVPB    Note to Pharmacy:  Lesia Hausen   : cabinet override      03/04/18 1639 03/05/18 0444   03/04/18 1622  ceFAZolin (ANCEF) 2-4 GM/100ML-% IVPB  Status:  Discontinued    Note to Pharmacy:  Lesia Hausen   : cabinet override      03/04/18 1622 03/04/18 1638   02/28/18 1200  cefTRIAXone (ROCEPHIN) 2 g in sodium chloride 0.9 % 100 mL IVPB     2 g 200 mL/hr over 30 Minutes Intravenous Every 24 hours 02/27/18 1339 03/03/18 1408   02/25/18 1600  vancomycin (VANCOCIN) 1,250 mg in sodium chloride 0.9 % 250 mL IVPB  Status:  Discontinued  1,250 mg 166.7 mL/hr over 90 Minutes Intravenous Every 24 hours 02/26/2018  1711 02/25/18 0801   02/25/18 1200  cefTRIAXone (ROCEPHIN) 1 g in sodium chloride 0.9 % 100 mL IVPB  Status:  Discontinued     1 g 200 mL/hr over 30 Minutes Intravenous Every 24 hours 02/25/18 0927 02/27/18 1339   02/25/18 1200  azithromycin (ZITHROMAX) 500 mg in sodium chloride 0.9 % 250 mL IVPB     500 mg 250 mL/hr over 60 Minutes Intravenous Every 24 hours 02/25/18 0927 03/03/18 1514   02/20/2018 2200  aztreonam (AZACTAM) 2 g in sodium chloride 0.9 % 100 mL IVPB  Status:  Discontinued     2 g 200 mL/hr over 30 Minutes Intravenous Every 8 hours 02/05/2018 1651 02/25/18 0927   02/23/2018 1515  aztreonam (AZACTAM) 2 g in sodium chloride 0.9 % 100 mL IVPB     2 g 200 mL/hr over 30 Minutes Intravenous  Once 02/19/2018 1508 02/07/2018 1621   02/04/2018 1515  vancomycin (VANCOCIN) 2,000 mg in sodium chloride 0.9 % 500 mL IVPB     2,000 mg 250 mL/hr over 120 Minutes Intravenous  Once 02/14/2018 1509 02/03/2018 1821        Objective:   Vitals:   03/06/18 0200 03/06/18 0300 03/06/18 0316 03/06/18 0354  BP: (!) 99/32 (!) 126/34 (!) 126/34   Pulse: 70 73 72   Resp: 12 15 15    Temp:    98.5 F (36.9 C)  TempSrc:    Axillary  SpO2: 93% 94% 95%   Weight:      Height:        Wt Readings from Last 3 Encounters:  02/17/2018 104.3 kg (230 lb)  01/27/18 107 kg (236 lb)  12/10/17 108.4 kg (239 lb)     Intake/Output Summary (Last 24 hours) at 03/06/2018 0532 Last data filed at 03/05/2018 2000 Gross per 24 hour  Intake 480 ml  Output 2350 ml  Net -1870 ml     Physical Exam  Awake Alert, Oriented X 3, No new F.N deficits, Normal affect [place].AT,PERRAL Supple Neck,No JVD, No cervical lymphadenopathy appriciated.  Symmetrical Chest wall movement, Good air movement bilaterally, CTAB RRR,No Gallops,Rubs or new Murmurs, No Parasternal Heave +ve B.Sounds, Abd Soft, No tenderness, No organomegaly appriciated, No rebound - guarding or rigidity. No Cyanosis, Clubbing or edema, No new Rash or bruise     Data  Review:    CBC Recent Labs  Lab 03/02/18 0336 03/03/18 0344 03/04/18 0342 03/05/18 0320 03/06/18 0328  WBC 10.4 10.2 12.1* 14.5* 18.8*  HGB 9.5* 8.8* 9.1* 9.9* 9.8*  HCT 31.2* 28.4* 29.8* 31.8* 32.0*  PLT 343 343 365 360 248  MCV 93.7 92.8 92.8 93.5 95.0  MCH 28.5 28.8 28.3 29.1 29.1  MCHC 30.4 31.0 30.5 31.1 30.6  RDW 14.9 15.2 15.4 15.8* 15.9*  LYMPHSABS 0.4* 0.4* 0.4* 0.7 0.6*  MONOABS 0.2 0.1 0.1 0.9 0.8  EOSABS 0.0 0.0 0.0 0.0 0.1  BASOSABS 0.0 0.0 0.0 0.0 0.0    Chemistries  Recent Labs  Lab 03/02/18 0336 03/03/18 0344 03/04/18 0342 03/05/18 0320 03/06/18 0328  NA 133* 133* 137 137 137  K 5.1 4.6 4.6 4.1 3.5  CL 93* 97* 101 97* 95*  CO2 29 26 26  32 37*  GLUCOSE 100* 111* 123* 88 87  BUN 48* 41* 35* 41* 30*  CREATININE 0.86 0.65 0.69 0.75 0.59  CALCIUM 9.1 8.5* 8.6* 8.8* 8.3*   ------------------------------------------------------------------------------------------------------------------ No results for input(s): CHOL, HDL,  LDLCALC, TRIG, CHOLHDL, LDLDIRECT in the last 72 hours.  No results found for: HGBA1C ------------------------------------------------------------------------------------------------------------------ No results for input(s): TSH, T4TOTAL, T3FREE, THYROIDAB in the last 72 hours.  Invalid input(s): FREET3 ------------------------------------------------------------------------------------------------------------------ No results for input(s): VITAMINB12, FOLATE, FERRITIN, TIBC, IRON, RETICCTPCT in the last 72 hours.  Coagulation profile No results for input(s): INR, PROTIME in the last 168 hours.  No results for input(s): DDIMER in the last 72 hours.  Cardiac Enzymes No results for input(s): CKMB, TROPONINI, MYOGLOBIN in the last 168 hours.  Invalid input(s): CK ------------------------------------------------------------------------------------------------------------------    Component Value Date/Time   BNP 170.4 (H)  02/25/2018 0515    Inpatient Medications  Scheduled Meds: . amLODipine  10 mg Oral Daily  . budesonide (PULMICORT) nebulizer solution  0.5 mg Nebulization BID  . dextromethorphan-guaiFENesin  1 tablet Oral BID  . furosemide  40 mg Oral Daily  . levalbuterol  0.63 mg Nebulization TID  . metoprolol succinate  150 mg Oral Daily  . pantoprazole  40 mg Oral BID  . sodium chloride flush  10-40 mL Intracatheter Q12H   Continuous Infusions: PRN Meds:.acetaminophen **OR** acetaminophen, clonazePAM, diphenhydrAMINE, fluticasone, HYDROcodone-acetaminophen, levalbuterol, LORazepam, ondansetron **OR** ondansetron (ZOFRAN) IV, sodium chloride flush  Micro Results Recent Results (from the past 240 hour(s))  Blood Culture (routine x 2)     Status: None   Collection Time: 02/07/2018  2:47 PM  Result Value Ref Range Status   Specimen Description   Final    BLOOD RIGHT ANTECUBITAL Performed at [facility] [address], [place], [place] [address]    Special Requests   Final    BOTTLES DRAWN AEROBIC AND ANAEROBIC Blood Culture adequate volume Performed at [facility] [address], [place], [place] [address]    Culture   Final    NO GROWTH 5 DAYS Performed at [facility], [place] [address], [place], [place] [address]    Report Status 03/01/2018 FINAL  Final  Blood Culture (routine x 2)     Status: None   Collection Time: 02/07/2018  2:48 PM  Result Value Ref Range Status   Specimen Description   Final    BLOOD LEFT FOREARM Performed at [facility] [address] [place], [place] [address]    Special Requests   Final    BOTTLES DRAWN AEROBIC AND ANAEROBIC Blood Culture adequate volume Performed at [facility] [address], [place], [place] [address]    Culture   Final    NO GROWTH 5 DAYS Performed at [facility], [place] [address] [place], [place] [address]    Report Status 03/01/2018 FINAL   Final  MRSA PCR Screening     Status: None   Collection Time: 01/29/2018  4:50 PM  Result Value Ref Range Status   MRSA by PCR NEGATIVE NEGATIVE Final    Comment:        The GeneXpert MRSA Assay (FDA approved for NASAL specimens only), is one component of a comprehensive MRSA colonization surveillance program. It is not intended to diagnose MRSA infection nor to guide or monitor treatment for MRSA infections. Performed at [facility], [place] [address], [place],  [address]   Culture, sputum-assessment     Status: None   Collection Time: 02/26/18  4:40 AM  Result Value Ref Range Status   Specimen Description SPUTUM  Final   Special Requests NONE  Final   Sputum evaluation   Final  Sputum specimen not acceptable for testing.  Please recollect.   [place] [address] 02/26/18 A NAVARRO Performed at [facility] [address] [place], [place] [address]    Report Status 02/26/2018 FINAL  Final  Urine Culture     Status: None   Collection Time: 02/26/18  5:45 PM  Result Value Ref Range Status   Specimen Description   Final    URINE, RANDOM Performed at [facility] [address], [place], [place] [address]    Special Requests   Final    NONE Performed at [facility], [place] [address], [place], [place] [address]    Culture   Final    NO GROWTH Performed at [facility], [place] [address], [place],  [address]    Report Status 02/28/2018 FINAL  Final    Radiology Reports Ct Chest W Contrast  Result Date: 02/10/2018 CLINICAL DATA:  Dyspnea. EXAM: CT CHEST WITH CONTRAST TECHNIQUE: Multidetector CT imaging of the chest was performed during intravenous contrast administration. CONTRAST:  8mL OMNIPAQUE IOHEXOL 300 MG/ML  SOLN COMPARISON:  PET scan of Feb 07, 2018.  CT scan of August 07, 2017. FINDINGS: Cardiovascular: Atherosclerosis of thoracic aorta is noted without aneurysm  formation. Coronary artery calcifications are noted. No pericardial effusion is noted. Mediastinum/Nodes: Thyroid gland and esophagus are unremarkable. Subcarinal adenopathy measuring 3.5 cm is noted. 2.4 cm right paratracheal adenopathy is noted. 12 mm right axillary lymph node is noted. Lungs/Pleura: No pneumothorax is noted. Continued presence of large mass in right lower lobe consistent with malignancy. Pleural spread of tumor is also noted and unchanged. Radiation fibrosis is noted medially in right upper lobe. Stable scarring or inflammation is noted anteriorly in the left upper lobe. Upper Abdomen: No acute abnormality. Musculoskeletal: No chest wall abnormality. No acute or significant osseous findings. IMPRESSION: Continued presence of large right lower lobe mass with pleural spread is noted consistent with history of lung cancer. 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 is well. Stable scarring or inflammation is noted anteriorly in the left upper lobe. Right paratracheal and subcarinal and right axillary adenopathy is noted consistent with metastatic disease. Coronary artery calcifications are noted suggesting coronary artery disease. Aortic Atherosclerosis (ICD10-I70.0). Electronically Signed   By: Marijo Conception, M.D.   On: 02/11/2018 19:10   Ct Angio Chest Pe W Or Wo Contrast  Result Date: 02/27/2018 CLINICAL DATA:  Lung cancer, elevated D-dimer EXAM: CT ANGIOGRAPHY CHEST WITH CONTRAST TECHNIQUE: Multidetector CT imaging of the chest was performed using the standard protocol during bolus administration of intravenous contrast. Multiplanar CT image reconstructions and MIPs were obtained to evaluate the vascular anatomy. CONTRAST:  141mL ISOVUE-370 IOPAMIDOL (ISOVUE-370) INJECTION 76% COMPARISON:  CT chest dated 02/11/2018 FINDINGS: Cardiovascular: Satisfactory opacification the bilateral pulmonary arteries to the lobar level. No evidence of pulmonary embolism. 4.7 cm ascending thoracic  aortic aneurysm, rapidly increasing from recent CT chest, when it measured 4.0 cm. Although unusual in appearance and suboptimally evaluated due to study technique, there is differential enhancement within the lumen, suggesting in ascending thoracic aortic dissection (series 6/image 44). This is favored to be a type A aortic dissection arising at the aortic root and terminating at the origin of the right brachiocephalic artery. Cardiomegaly.  No pericardial effusion. Atherosclerotic calcifications of the aortic arch. Three vessel coronary atherosclerosis. Enlargement of the main pulmonary artery, raising the possibility of pulmonary arterial hypertension. Mediastinum/Nodes: 13 mm short axis distinct subcarinal  node. 2.0 cm short axis azygoesophageal recess node. Visualized thyroid is unremarkable. Lungs/Pleura: Large right lower lobe mass, unchanged from recent CT, with lymphangitic and pleural spread in the right hemithorax. Radiation changes in the anterior left upper lobe. Patchy left lower lobe opacity, likely atelectasis. Small pleural effusion. Mild centrilobular and paraseptal emphysematous changes, upper lobe predominant. No pneumothorax. Upper Abdomen: Visualized upper abdomen is grossly unremarkable, noting a mildly nodular hepatic contour. Musculoskeletal: Visualized osseous structures are within normal limits. Review of the MIP images confirms the above findings. IMPRESSION: No evidence of pulmonary embolism. 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 Rapid enlargement of the ascending thoracic aorta, now measuring 4.7 cm, previously 4.0 cm on recent CT. Thoracic surgery consultation is suggested. Otherwise unchanged from recent CT and PET. Extensive tumor in the right lower lobe/hemithorax with pleural and nodal metastases, better evaluated on prior  studies. Critical Value/emergent results were called by telephone at the time of interpretation on 02/27/2018 at 9:57 pm to Dr. Halford Chessman, who verbally acknowledged these results. Aortic Atherosclerosis (ICD10-I70.0) and Emphysema (ICD10-J43.9). Electronically Signed   By: [place] Hy M.D.   On: 02/27/2018 22:00   Dg Esophagus  Result Date: 02/26/2018 CLINICAL DATA:  Dysphagia.  Metastatic lung cancer. EXAM: ESOPHOGRAM/BARIUM SWALLOW TECHNIQUE: Single contrast examination was performed using  thin barium. FLUOROSCOPY TIME:  Fluoroscopy Time:  42 seconds Radiation Exposure Index (if provided by the fluoroscopic device): 26.1 mGy COMPARISON:  CT scan of the chest dated 02/22/2018 FINDINGS: There is a slight mass effect upon the right side of the esophagus just below the carina consistent with the adenopathy seen on chest x-ray. However, there is no stricture at that site. The patient has poor esophageal motility with a poor secondary stripping wave with to and fro peristalsis in the mid esophagus with intermittent tertiary contractions in the mid esophagus. There is a small hiatal hernia without a stricture. Note is made of a moderate right effusion as well as bibasilar lung consolidation, right greater than left. IMPRESSION: 1. Poor esophageal motility with to and fro peristalsis in the mid esophagus with occasional tertiary contractions in the mid esophagus. 2. Slight mass effect on the mid esophagus due to subcarinal adenopathy. However, this does not cause significant narrowing of the esophagus. 3. Small hiatal hernia without a stricture. Electronically Signed   By: Lorriane Shire M.D.   On: 02/26/2018 16:02   Nm Pet Image Restag (ps) Skull Base To Thigh  Result Date: 02/08/2018 CLINICAL DATA:  Subsequent treatment strategy for lung cancer. EXAM: NUCLEAR MEDICINE PET SKULL BASE TO THIGH TECHNIQUE: 11.26 mCi F-18 FDG was injected intravenously. Full-ring PET imaging was performed from the skull base to  thigh after the radiotracer. CT data was obtained and used for attenuation correction and anatomic localization. Fasting blood glucose: 113 mg/dl COMPARISON:  11/05/2017 FINDINGS: Mediastinal blood pool activity: SUV max 2.49 NECK: No hypermetabolic lymph nodes in the neck. Incidental CT findings: none CHEST: New hypermetabolic right paratracheal lymph node measures 2.1 cm and has an SUV max equal to 13.6. Newly hypermetabolic low right paratracheal lymph node measures 8 mm with an SUV max of 6.79. Subcarinal lymph node measures 1.7 cm and has an SUV max of 16.5. Previously 9 mm within SUV max of 6.3. Right lower lobe lung mass measures 7.6 by 7.6 cm within SUV max of 13.6. Previously this measured  5.1 x 5.6 cm within SUV max of 13.4. Adjacent nodule measures 1.2 cm within SUV max of 2.28. Previously 0.7 cm within SUV max of 5.05. Thickened rind of increased pleural soft tissue overlying the posterior and lateral right base is intensely hypermetabolic and increased from previous exam. Within the right posterior medial costophrenic sulcus pleural tumor has a thickness of 2.5 cm within SUV max of 14.6. Measured at the same level on previous exam this measured 1 cm within SUV max of 7.1. Again noted are advanced changes of external beam radiation within the paramediastinal right lung including fibrosis and masslike architectural distortion. Similar appearance of nonspecific pneumonitis within the left upper lobe. Incidental CT findings: Moderate changes of emphysema. Aortic atherosclerosis identified. Calcifications within the LAD and left circumflex and RCA coronary arteries noted. ABDOMEN/PELVIS: No abnormal hypermetabolic activity within the liver, pancreas, adrenal glands, or spleen. No hypermetabolic lymph nodes in the abdomen or pelvis. Incidental CT findings: Gallstones are identified within the gallbladder. Aortic atherosclerosis without evidence for aneurysm. SKELETON: No focal hypermetabolic activity to  suggest skeletal metastasis. Incidental CT findings: none IMPRESSION: 1. Significant interval progression of hypermetabolic tumor within the chest. 2. The dominant mass within the right lower lobe has increased in size in the interval. There has also been progressive pleural spread of tumor overlying the posterior medial and lateral right lower lobe. 3. Interval progression of right paratracheal and subcarinal hypermetabolic adenopathy. 4. Aortic Atherosclerosis (ICD10-I70.0) and Emphysema (ICD10-J43.9). 5. LAD, left circumflex and RCA coronary artery atherosclerotic calcifications. Electronically Signed   By: Kerby Moors M.D.   On: 02/08/2018 22:18   Ir US Guide Vasc Access Right  Result Date: 03/05/2018 CLINICAL DATA:  Metastatic lung cancer EXAM: RIGHT INTERNAL JUGULAR SINGLE LUMEN POWER PORT CATHETER INSERTION Date:  03/05/2018 03/05/2018 4:14 pm Radiologist:  Jerilynn Mages. Daryll Brod, MD Guidance:  Ultrasound fluoroscopic MEDICATIONS: Clindamycin 600 mg IV; The antibiotic was administered within an appropriate time interval prior to skin puncture. ANESTHESIA/SEDATION: Versed 1.5 mg IV; Fentanyl 75 mcg IV; Moderate Sedation Time:  31 minutes The patient was continuously monitored during the procedure by the interventional radiology nurse under my direct supervision. FLUOROSCOPY TIME:  30 seconds (10 mGy) COMPLICATIONS: None immediate. CONTRAST:  None. PROCEDURE: Informed consent was obtained from the patient following explanation of the procedure, risks, benefits and alternatives. The patient understands, agrees and consents for the procedure. All questions were addressed. A time out was performed. Maximal barrier sterile technique utilized including caps, mask, sterile gowns, sterile gloves, large sterile drape, hand hygiene, and 2% chlorhexidine scrub. Under sterile conditions and local anesthesia, right internal jugular micropuncture venous access was performed. Access was performed with ultrasound. Images were  obtained for documentation of the patent right internal jugular vein. A guide wire was inserted followed by a transitional dilator. This allowed insertion of a guide wire and catheter into the IVC. Measurements were obtained from the SVC / RA junction back to the right IJ venotomy site. In the right infraclavicular chest, a subcutaneous pocket was created over the second anterior rib. This was done under sterile conditions and local anesthesia. 1% lidocaine with epinephrine was utilized for this. A 2.5 cm incision was made in the skin. Blunt dissection was performed to create a subcutaneous pocket over the right pectoralis major muscle. The pocket was flushed with saline vigorously. There was adequate hemostasis. The port catheter was assembled and checked for leakage. The port catheter was secured in the pocket with two retention sutures. The tubing was tunneled  subcutaneously to the right venotomy site and inserted into the SVC/RA junction through a valved peel-away sheath. Position was confirmed with fluoroscopy. Images were obtained for documentation. The patient tolerated the procedure well. No immediate complications. Incisions were closed in a two layer fashion with 4 - 0 Vicryl suture. Dermabond was applied to the skin. The port catheter was accessed, blood was aspirated followed by saline and heparin flushes. Needle was removed. A dry sterile dressing was applied. IMPRESSION: Ultrasound and fluoroscopically guided right internal jugular single lumen power port catheter insertion. Tip in the SVC/RA junction. Catheter ready for use. Electronically Signed   By: Jerilynn Mages.  Shick M.D.   On: 03/05/2018 16:23   Dg Chest Port 1 View  Result Date: 03/04/2018 CLINICAL DATA:  Acute respiratory failure. History of COPD, former smoker, lung malignancy. EXAM: PORTABLE CHEST 1 VIEW COMPARISON:  Portable chest x-ray of March 01, 2018 FINDINGS: The lungs are adequately inflated. Patchy airspace opacity in the left perihilar  region persists but is slightly less conspicuous today. There is stable volume loss on the right with pleural thickening or pleural fluid overlying the superior and lateral aspects of the right lung. Increased density at the right lung base persists consistent with a known mass. The cardiac silhouette is enlarged. The pulmonary vascularity is mildly prominent. There is calcification in the wall of the aortic arch. The observed bony thorax is unremarkable. IMPRESSION: Persistent airspace opacity in the left perihilar region likely reflects pneumonia. Persistent volume loss on the right with increased mid and lower right hemithorax density consistent with pleural fluid and a known mass. Persistent cardiomegaly with slightly increased pulmonary vascular prominence consistent with mild CHF. Electronically Signed   By: David  Martinique M.D.   On: 03/04/2018 07:20   Dg Chest Port 1 View  Result Date: 03/01/2018 CLINICAL DATA:  Acute respiratory failure. History of hypertension, lung cancer, COPD, PE, former smoker. EXAM: PORTABLE CHEST 1 VIEW COMPARISON:  Chest x-rays dated 02/27/2018 and 02/22/2018. FINDINGS: Cardiomegaly is stable. Overall cardiomediastinal silhouette is stable. Atherosclerotic changes again noted at the aortic arch. Stable opacity at the RIGHT lung base. New platelike opacity within the LEFT perihilar lung, pneumonia versus atelectasis. No pneumothorax seen. IMPRESSION: 1. Stable cardiomegaly. 2. Persistent opacity at the RIGHT lung base, compatible with the mass/consolidation demonstrated on recent chest CT of 02/27/2018. 3. New platelike opacity within the LEFT perihilar lung, pneumonia versus atelectasis, less likely asymmetric edema. Electronically Signed   By: Franki Cabot M.D.   On: 03/01/2018 07:37   Dg Chest Port 1 View  Result Date: 02/27/2018 CLINICAL DATA:  72 year old female with large right lower lung mass, lung cancer. Shortness of breath. EXAM: PORTABLE CHEST 1 VIEW COMPARISON:   Chest CT and radiographs 02/27/2018. FINDINGS: Portable AP semi upright view at 0433 hours. Continued dense right lower lung and retrocardiac opacity corresponding to the lung mass/drowned lung. Mildly increased pulmonary vascularity but no overt edema. Increased left lung base opacity most resembling atelectasis. No pneumothorax. No large pleural effusion. Stable cardiac size and mediastinal contours. Visualized tracheal air column is within normal limits. IMPRESSION: 1. Increased left lung base atelectasis and pulmonary vascular congestion since 02/15/2018. 2. Continued dense right lower lung opacification due to lung mass and drowned lung. Electronically Signed   By: Genevie Ann M.D.   On: 02/27/2018 08:31   Dg Chest Port 1 View  Result Date: 02/01/2018 CLINICAL DATA:  Shortness of breath. EXAM: PORTABLE CHEST 1 VIEW COMPARISON:  Chest CT 02/07/2018 FINDINGS: Cardiomediastinal silhouette  is normal. Calcific atherosclerotic disease and tortuosity of the aorta. There is no evidence of pneumothorax. Similar appearance of right hemithorax with airspace opacity in the right lower lobe, likely representing tumor and diffuse peribronchovascular infiltrate versus lymphangitic spread of tumor. Interval development of right pleural effusion. Chronic interstitial lung changes versus metastatic disease in the left upper lobe of the lung also stable. Osseous structures are without acute abnormality. Soft tissues are grossly normal. IMPRESSION: Similar abnormal appearance of the lungs with airspace opacity likely representing the known pulmonary mass in the right lower lobe, extensive peribronchial airspace consolidation versus lymphangitic spread of tumor. Interval enlargement of right pleural effusion. Chronic interstitial lung scarring versus metastatic disease in the left upper lobe, stable. Electronically Signed   By: Fidela Salisbury M.D.   On: 02/01/2018 15:12   Ir Fluoro Guide Port Insertion Right  Result Date:  03/05/2018 CLINICAL DATA:  Metastatic lung cancer EXAM: RIGHT INTERNAL JUGULAR SINGLE LUMEN POWER PORT CATHETER INSERTION Date:  03/05/2018 03/05/2018 4:14 pm Radiologist:  Jerilynn Mages. Daryll Brod, MD Guidance:  Ultrasound fluoroscopic MEDICATIONS: Clindamycin 600 mg IV; The antibiotic was administered within an appropriate time interval prior to skin puncture. ANESTHESIA/SEDATION: Versed 1.5 mg IV; Fentanyl 75 mcg IV; Moderate Sedation Time:  31 minutes The patient was continuously monitored during the procedure by the interventional radiology nurse under my direct supervision. FLUOROSCOPY TIME:  30 seconds (10 mGy) COMPLICATIONS: None immediate. CONTRAST:  None. PROCEDURE: Informed consent was obtained from the patient following explanation of the procedure, risks, benefits and alternatives. The patient understands, agrees and consents for the procedure. All questions were addressed. A time out was performed. Maximal barrier sterile technique utilized including caps, mask, sterile gowns, sterile gloves, large sterile drape, hand hygiene, and 2% chlorhexidine scrub. Under sterile conditions and local anesthesia, right internal jugular micropuncture venous access was performed. Access was performed with ultrasound. Images were obtained for documentation of the patent right internal jugular vein. A guide wire was inserted followed by a transitional dilator. This allowed insertion of a guide wire and catheter into the IVC. Measurements were obtained from the SVC / RA junction back to the right IJ venotomy site. In the right infraclavicular chest, a subcutaneous pocket was created over the second anterior rib. This was done under sterile conditions and local anesthesia. 1% lidocaine with epinephrine was utilized for this. A 2.5 cm incision was made in the skin. Blunt dissection was performed to create a subcutaneous pocket over the right pectoralis major muscle. The pocket was flushed with saline vigorously. There was adequate  hemostasis. The port catheter was assembled and checked for leakage. The port catheter was secured in the pocket with two retention sutures. The tubing was tunneled subcutaneously to the right venotomy site and inserted into the SVC/RA junction through a valved peel-away sheath. Position was confirmed with fluoroscopy. Images were obtained for documentation. The patient tolerated the procedure well. No immediate complications. Incisions were closed in a two layer fashion with 4 - 0 Vicryl suture. Dermabond was applied to the skin. The port catheter was accessed, blood was aspirated followed by saline and heparin flushes. Needle was removed. A dry sterile dressing was applied. IMPRESSION: Ultrasound and fluoroscopically guided right internal jugular single lumen power port catheter insertion. Tip in the SVC/RA junction. Catheter ready for use. Electronically Signed   By: Jerilynn Mages.  Shick M.D.   On: 03/05/2018 16:23    Time Spent in minutes  30   [name] M.D on 03/06/2018 at 5:32 AM  Between 7am to 7pm - Pager - [phone]   After 7pm go to [email or web address] - password [facility]  [facility] -  Office  [phone]

## 2018-03-06 NOTE — Progress Notes (Addendum)
  Speech Language Pathology Treatment: Dysphagia  Patient Details Name: Storm Dulski MRN: [id number] DOB: 1946-09-03 Today's Date: 03/06/2018 Time: 6759-1638 SLP Time Calculation (min) (ACUTE ONLY): 10 min  Assessment / Plan / Recommendation Clinical Impression  Pt seen to provide dysphagia mitigation strategies given findings of esophageal dysmotility on recent esophagram.   Pt self reports she has not paid much attention to items she has a harder time with = food vs liquid. But advised she would monitor.   Observed pt with intake of cracker, pudding, and gingerale. No indications of pharyngeal residuals - as pt with single swallows largely.  No immediate indications of airway compromise.  Delayed cough noted after "snack" however pt also has COPD and  Self reports gastroesophageal reflux prior to admit.  Advised her to inform MD of severe refluxing prior to admit.   SLP provided pt with dysphagia mitigation strategies in writing using teach back.  Ordered extra gravies/sauces. Advised several small meals/ adequate mastication and liquids t/o meal.  Resting if sense residuals or coughing.     SlP spoke to RN at 1705 who reports pt tolerated po intake well.  Will follow up x1 to assure tolerance.    HPI HPI: pt is a 72 yo female adm to [facility] with respiratory difficulties.  PMH + for lung cancer s/p XRT, COPD, panic attacks, dysphagia.  Pt with no effusion  - ?pna and squamous cell carcinoma of right lung.  Pt reports problems swallowing since her radiation tx -  intermittently-  She states she will choke on saliva and regurgitate non-digested food.  She also states she will choke on liquids.  Pt underwent an esophageal evaluation - barium swallow that showed poor esophageal motility, slight mass effect on mid esophagus (did not cause significant narrowing of esophagus) due to adenocarcinoma, small hiatal hernia without stricture.  Follow up SLP to assess tolerance of diet and help mitigate dysphagia.   Per RN yesterday, pt became choked on roast beef.        SLP Plan  Continue with current plan of care       Recommendations  Diet recommendations: Regular;Thin liquid(to allow pt to order items she tolerates better given awareness to her dysphagia) Liquids provided via: Straw Medication Administration: Other (Comment)(as tolerated) Supervision: Patient able to self feed Compensations: Minimize environmental distractions;Slow rate;Small sips/bites;Other (Comment)(drink liquids t/o meal, small frequent meals) Postural Changes and/or Swallow Maneuvers: Seated upright 90 degrees;Out of bed for meals;Upright 30-60 min after meal                Oral Care Recommendations: Oral care BID Follow up Recommendations: None SLP Visit Diagnosis: Dysphagia, unspecified (R13.10) Plan: Continue with current plan of care       GO              Luanna Salk, [place] [facility] SLP [phone]  Macario Golds 03/06/2018, 1:59 PM

## 2018-03-06 NOTE — Progress Notes (Signed)
Physical Therapy Treatment Patient Details Name: Alisha Fisher MRN: [id number] DOB: July 04, 1946 Today's Date: 03/06/2018    History of Present Illness 72 y.o. female with a past medical history significant for Squamous cell carcinoma Lung locally advanced, COPD home O2 3L, wheelchair bound, morbid obesity, OSA not on CPAP, dCHF, chronic IDA, and HTN who presents with 4-5 days malaise, progressive cough and now dyspnea and admitted for acute on chronic respiratory failure with hypoxia and hypercapnia    PT Comments    Max encouragement for pt participation. Pt with multiple complaints/excuses as to why she will not attempt to progress mobility. O2 sats dropped to mid 80s% on O2 during session. Pt refused to get into recliner. Assisted back to bed at pt's request. Will continue to follow and progress activity as pt will allow.    Follow Up Recommendations  SNF     Equipment Recommendations  None recommended by PT    Recommendations for Other Services       Precautions / Restrictions Precautions Precautions: Fall Precaution Comments: monitor sats Restrictions Weight Bearing Restrictions: No    Mobility  Bed Mobility Overal bed mobility: Needs Assistance Bed Mobility: Supine to Sit;Sit to Supine     Supine to sit: Min assist;+2 for physical assistance;+2 for safety/equipment Sit to supine: Min assist;+2 for physical assistance;+2 for safety/equipment   General bed mobility comments: Assist for trunk and bil LEs. Multimodal cuing and Mod encouragement required.   Transfers Overall transfer level: Needs assistance Equipment used: Rolling walker (2 wheeled) Transfers: Sit to/from Stand Sit to Stand: Mod assist;+2 safety/equipment;+2 physical assistance;From elevated surface         General transfer comment: MAX encouragement required for pt to attempt standing x 2. Pt with multiple complaints/excuses. She stood for ~10- seconds each attempt. O2 sats dropped to 85% on 3L so  increased to 4L at pt's request.   Ambulation/Gait             General Gait Details: NT-pt refused to try   Stairs             Wheelchair Mobility    Modified Rankin (Stroke Patients Only)       Balance Overall balance assessment: Needs assistance         Standing balance support: Bilateral upper extremity supported Standing balance-Leahy Scale: Poor                              Cognition Arousal/Alertness: Awake/alert Behavior During Therapy: WFL for tasks assessed/performed Overall Cognitive Status: No family/caregiver present to determine baseline cognitive functioning                                 General Comments: pt appears a little confused at times. slow to respond at times.       Exercises      General Comments        Pertinent Vitals/Pain Pain Assessment: Faces Pain Score: 2  Faces Pain Scale: Hurts little more Pain Location: arms, legs  Pain Descriptors / Indicators: Sore Pain Intervention(s): Monitored during session    Home Living                      Prior Function            PT Goals (current goals can now be found in the care plan section) Progress  towards PT goals: Progressing toward goals    Frequency    Min 2X/week      PT Plan Current plan remains appropriate    Co-evaluation              AM-PAC PT "6 Clicks" Daily Activity  Outcome Measure  Difficulty turning over in bed (including adjusting bedclothes, sheets and blankets)?: A Lot Difficulty moving from lying on back to sitting on the side of the bed? : Unable Difficulty sitting down on and standing up from a chair with arms (e.g., wheelchair, bedside commode, etc,.)?: Unable Help needed moving to and from a bed to chair (including a wheelchair)?: A Lot Help needed walking in hospital room?: Total Help needed climbing 3-5 steps with a railing? : Total 6 Click Score: 8    End of Session Equipment Utilized  During Treatment: Gait belt;Oxygen Activity Tolerance: Patient limited by fatigue Patient left: in bed;with call bell/phone within reach;with bed alarm set   PT Visit Diagnosis: Difficulty in walking, not elsewhere classified (R26.2);Muscle weakness (generalized) (M62.81)     Time: 8337-4451 PT Time Calculation (min) (ACUTE ONLY): 30 min  Charges:  $Therapeutic Activity: 23-37 mins                    G Codes:         Weston Anna, MPT Pager: [phone]

## 2018-03-07 DIAGNOSIS — I5030 Unspecified diastolic (congestive) heart failure: Secondary | ICD-10-CM

## 2018-03-07 DIAGNOSIS — I4891 Unspecified atrial fibrillation: Secondary | ICD-10-CM

## 2018-03-07 LAB — COMPREHENSIVE METABOLIC PANEL
ALT: 18 U/L (ref 14–54)
AST: 15 U/L (ref 15–41)
Albumin: 2.5 g/dL — ABNORMAL LOW (ref 3.5–5.0)
Alkaline Phosphatase: 49 U/L (ref 38–126)
Anion gap: 7 (ref 5–15)
BILIRUBIN TOTAL: 0.9 mg/dL (ref 0.3–1.2)
BUN: 17 mg/dL (ref 6–20)
CO2: 37 mmol/L — ABNORMAL HIGH (ref 22–32)
CREATININE: 0.59 mg/dL (ref 0.44–1.00)
Calcium: 8.2 mg/dL — ABNORMAL LOW (ref 8.9–10.3)
Chloride: 91 mmol/L — ABNORMAL LOW (ref 101–111)
GFR calc Af Amer: >60 mL/min (ref >60)
Glucose, Bld: 101 mg/dL — ABNORMAL HIGH (ref 65–99)
POTASSIUM: 3.5 mmol/L (ref 3.5–5.1)
Sodium: 135 mmol/L (ref 135–145)
TOTAL PROTEIN: 5.5 g/dL — AB (ref 6.5–8.1)

## 2018-03-07 LAB — CBC
HEMATOCRIT: 32.9 % — AB (ref 36.0–46.0)
HEMOGLOBIN: 10.3 g/dL — AB (ref 12.0–15.0)
MCH: 29.8 pg (ref 26.0–34.0)
MCHC: 31.3 g/dL (ref 30.0–36.0)
MCV: 95.1 fL (ref 78.0–100.0)
Platelets: 225 10*3/uL (ref 150–400)
RBC: 3.46 MIL/uL — AB (ref 3.87–5.11)
RDW: 15.9 % — AB (ref 11.5–15.5)
WBC: 16.4 10*3/uL — AB (ref 4.0–10.5)

## 2018-03-07 LAB — TROPONIN I
Troponin I: 0.03 ng/mL (ref <0.03)
Troponin I: 0.04 ng/mL (ref <0.03)
Troponin I: 0.05 ng/mL (ref <0.03)

## 2018-03-07 MED ORDER — METOPROLOL SUCCINATE ER 50 MG PO TB24: 150.0000 mg | ORAL_TABLET | Freq: Every day | ORAL | 0 refills | Status: AC

## 2018-03-07 MED ORDER — MORPHINE SULFATE (PF) 2 MG/ML IV SOLN
0.5000 mg | Freq: Four times a day (QID) | INTRAVENOUS | Status: DC | PRN
  Administered 2018-03-07 – 2018-03-15 (×5): 0.5 mg via INTRAVENOUS
  Filled 2018-03-07 (×5): qty 1

## 2018-03-07 MED ORDER — ENOXAPARIN SODIUM 120 MG/0.8ML ~~LOC~~ SOLN
1.0000 mg/kg | Freq: Two times a day (BID) | SUBCUTANEOUS | Status: DC
  Administered 2018-03-07 – 2018-03-08 (×3): 105 mg via SUBCUTANEOUS
  Filled 2018-03-07 (×3): qty 0.7

## 2018-03-07 MED ORDER — BUDESONIDE 0.5 MG/2ML IN SUSP: 0.5000 mg | Freq: Two times a day (BID) | RESPIRATORY_TRACT | 0 refills | Status: AC

## 2018-03-07 MED ORDER — ACETAMINOPHEN 325 MG PO TABS: 650.0000 mg | ORAL_TABLET | Freq: Four times a day (QID) | ORAL | 0 refills | Status: AC | PRN

## 2018-03-07 MED ORDER — DM-GUAIFENESIN ER 30-600 MG PO TB12: 1.0000 | ORAL_TABLET | Freq: Two times a day (BID) | ORAL | 0 refills | Status: AC

## 2018-03-07 MED ORDER — LEVALBUTEROL HCL 0.63 MG/3ML IN NEBU: 0.6300 mg | INHALATION_SOLUTION | Freq: Three times a day (TID) | RESPIRATORY_TRACT | 12 refills | Status: AC

## 2018-03-07 MED ORDER — METOPROLOL TARTRATE 5 MG/5ML IV SOLN
5.0000 mg | Freq: Once | INTRAVENOUS | Status: AC
  Administered 2018-03-07: 5 mg via INTRAVENOUS

## 2018-03-07 MED ORDER — METOPROLOL TARTRATE 5 MG/5ML IV SOLN
5.0000 mg | Freq: Once | INTRAVENOUS | Status: AC
  Administered 2018-03-07: 5 mg via INTRAVENOUS
  Filled 2018-03-07: qty 5

## 2018-03-07 MED ORDER — LEVALBUTEROL HCL 0.63 MG/3ML IN NEBU: 0.6300 mg | INHALATION_SOLUTION | RESPIRATORY_TRACT | 12 refills | Status: AC | PRN

## 2018-03-07 MED ORDER — HYDROCODONE-ACETAMINOPHEN 5-325 MG PO TABS
1.0000 | ORAL_TABLET | Freq: Four times a day (QID) | ORAL | 0 refills | Status: AC | PRN
Start: 2018-03-07 — End: ?

## 2018-03-07 MED ORDER — METOPROLOL TARTRATE 5 MG/5ML IV SOLN
INTRAVENOUS | Status: AC
  Filled 2018-03-07: qty 5

## 2018-03-07 MED ORDER — FUROSEMIDE 40 MG PO TABS: 40.0000 mg | ORAL_TABLET | Freq: Every day | ORAL | 0 refills | Status: AC

## 2018-03-07 MED ORDER — PREMIER PROTEIN SHAKE
11.0000 [oz_av] | ORAL | Status: DC
  Administered 2018-03-07 – 2018-03-15 (×4): 11 [oz_av] via ORAL
  Filled 2018-03-07 (×9): qty 325.31

## 2018-03-07 MED ORDER — AMLODIPINE BESYLATE 5 MG PO TABS
5.0000 mg | ORAL_TABLET | Freq: Every day | ORAL | Status: DC
  Administered 2018-03-08 – 2018-03-15 (×7): 5 mg via ORAL
  Filled 2018-03-07 (×7): qty 1

## 2018-03-07 MED ORDER — ALUM & MAG HYDROXIDE-SIMETH 200-200-20 MG/5ML PO SUSP
15.0000 mL | ORAL | Status: DC | PRN
Start: 2018-03-07 — End: 2018-03-16
  Administered 2018-03-07 – 2018-03-10 (×4): 15 mL via ORAL
  Filled 2018-03-07 (×4): qty 30

## 2018-03-07 NOTE — Progress Notes (Signed)
Initial Nutrition Assessment  DOCUMENTATION CODES:   Obesity unspecified  INTERVENTION:  - Will order Premier Protein once/day, this supplement provides 160 kcal and 30 grams of protein.  - Continue to encourage PO intakes.   NUTRITION DIAGNOSIS:   Increased nutrient needs related to chronic illness, catabolic illness, cancer and cancer related treatments as evidenced by estimated needs.  GOAL:   Patient will meet greater than or equal to 90% of their needs  MONITOR:   PO intake, Supplement acceptance, Weight trends, Labs  REASON FOR ASSESSMENT:   LOS(day 11)  ASSESSMENT:   72 year old female with medical history significant for locally advanced squamous cell carcinoma of the lung, COPD on home oxygen 3 L/min, wheelchair-bound, morbid obesity, OSA, diastolic heart failure, chronic iron deficiency anemia, hypertension presented to the ED with a 5-day history of malaise, worsening cough/shortness of breath.  Pt was on CLD vs NPO several times throughout hospitalization. Diet advanced from CLD to Heart Healthy yesterday at 10:30 AM. Pt subsequently consumed 25% of lunch yesterday, 50% of dinner yesterday, and 75% of breakfast today. SLP following patient and saw her yesterday and provided her with strategies to make swallowing easier/safer for patient d/y esophageal dysmotility.   NFPE outlined below. Per review, pt had lost 6 lbs (2.5% body weight) from 4/29-5/27. This is not significant for time frame. No new weight since 5/27; will order for new weight to be obtained today.   Per Dr. Julianne Rice note yesterday morning: Port-a-cath to be placed yesterday with consideration of switching to IV chemo (was on Keytruda). 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  Medications reviewed; 40 mg oral Lasix/day, 40 mg oral Protonix BID. Labs reviewed; Cl: 91 mmol/L, Ca: 8.2 mg/dL.     NUTRITION - FOCUSED PHYSICAL EXAM:  Completed; no muscle  and no fat wasting, mild edema noted to all extremities.   Diet Order:   Diet Order           Diet Heart Room service appropriate? Yes; Fluid consistency: Thin  Diet effective now          EDUCATION NEEDS:   No education needs have been identified at this time  Skin:  Skin Assessment: Reviewed RN Assessment  Last BM:  6/5  Height:   Ht Readings from Last 1 Encounters:  02/12/2018 5\' 7"  (1.702 m)    Weight:   Wt Readings from Last 1 Encounters:  02/20/2018 230 lb (104.3 kg)    Ideal Body Weight:  61.36 kg  BMI:  Body mass index is 36.02 kg/m.  Estimated Nutritional Needs:   Kcal:  2090-2295 (20-22 kcal/kg)  Protein:  105-125 grams  Fluid:  >/= 2 L/day      Jarome Matin, MS, RD, LDN, [facility] Inpatient Clinical Dietitian Pager # [phone] After hours/weekend pager # [phone]

## 2018-03-07 NOTE — Progress Notes (Signed)
Patient complaining of pressure center of chest rating discomfort a 6/10.  Pt. States "it started this morning, this happens a lot."  RN text paged Dr. [name], MD updated on patients change in status.  MD to place orders.  RN will continue to monitor.

## 2018-03-07 NOTE — Progress Notes (Signed)
CRITICAL VALUE ALERT  Critical Value:  Troponin 0.03  Date & Time Notied:  03/07/18  Provider Notified: Dr. [name]  Orders Received/Actions taken: No new orders at this time.

## 2018-03-07 NOTE — Progress Notes (Addendum)
Patient ID: Alisha Fisher, female   DOB: [birth year], 72 y.o.   MRN: [id number]                                                                PROGRESS NOTE                                                                                                                                                                                                             Patient Demographics:    Alisha Fisher, is a 72 y.o. female, DOB - July 16, 1946, QIH:[id number]  Admit date - 02/15/2018   Admitting Physician Edwin Dada, MD  Outpatient Primary MD for the patient is [name], MD  LOS - 11  Outpatient Specialists:     Chief Complaint  Patient presents with  . 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 In the ED, patient was found to be hypoxic, febrile and tachycardic. Patient admitted for further management. She required BiPAP during hospitalization due to acute on chronic respiratory failure, COPD exacerbation, OHS, pulmonary edema. PCCM was consulted. She was also found to have type A thoracic aortic dissection and was deemed not to be a surgical candidate. She was managed with IV esmolol and cardene gtt for BP control.      Subjective:    Alisha Fisher today has been feeling better. Breathing stable.  Pt then apparently went into Afib with RVR denies cp, fever, chills, cough, sob.   No headache, No abdominal pain - No Nausea, No new weakness tingling or numbness,     Assessment  & Plan :    Principal Problem:   Acute on chronic respiratory failure with hypercapnia (HCC) Active Problems:   Obesity   COPD (chronic obstructive pulmonary  disease) (HCC)   Essential hypertension   Squamous cell lung cancer, right (HCC)   Chronic diastolic heart failure (HCC)   OSA (obstructive sleep apnea)   Iron deficiency anemia   Anxiety disorder   Goals of care,  counseling/discussion   COPD with acute exacerbation (HCC)   Pleural effusion   Community acquired pneumonia   Community acquired pneumonia of right lower lobe of lung ([place])   AKI (acute kidney injury) ([place])   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)   CHF (congestive heart failure) (HCC)   Afib with RVR Trop I q6h x3 TSH prev wnl Cardiac echo 5/29 showed preserved EF Metoprolol 5mg  iv x2 Cont po metoprolol Start Lovenox 1mg /kg [place] bid  Acute on chronic hypoxic and hypercapnic respiratory failure/COPDexacerbation/Obesity hypoventilation syndrome/Acute pulmonary edema/CAP -Currentlyweaned off bipap, will needqhs  -ABG with improved hypercarbia -PCCM consulted, appreciate recs -Continue duonebs/inhaler, steroidtapered off 6/4, completed IV antibiotics -Transition to Lasix 40mg  po qday (03/06/2018)  Type A Thoracic Aortic dissection -CT showed type A thoracic aortic dissection, 4.7cm -Not a surgical candidate, strict BP/HR control -ECHO showed: EF of 55-60%, PA peak pressure of 27mmhg -Weaned off esmolol and cardene gtt. ContinuePO metoprolol, norvasc -GoalSBP 100-120  Leukocytosis improving ? Steroids vs infection Currently afebrile, will monitor CXR 6/6=> persistent opacity right lung base, left lung improving Repeat cbc in am  ??Post-obstructive pneumonia/radiation fibrosis -Procalcitonin trendeddown to 0.34 -Urine strep pneumo, Legionella both negative -Blood culture x2 negative  -Chest x-ray: Extensive peribronchial airspace consolidation versus lymphangitic spread of tumor, repeat with now worsening vascular congestion -CT chest: Continued presence of large right lower lobe mass with pleural spread is noted consistent with history of lung cancer.  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 is well -CompletedIV ceftriaxone and azithromycin X 7 days  AKI -Resolved  Iron deficiency/anemia of chronic disease -Baseline hemoglobin between 10-11 -Status post IV iron on 02/26/2018  Squamous cell carcinoma of the right lung -Oncology following, on Keytruda (oncology considering switching to chemo) -Port-a-cath  -appreciate oncology input  Acute on chronic diastolic heart failure -Echo showed EF of 65 to 79%, grade 2 diastolic dysfunction -ContinueLasix   Obstructive sleep apnea -OnBiPAPscheduled at night -Has declined outpatient sleep study in the past  GERD -Continue Protonix  Deconditioning/GOC discussion -Palliative consulted   DVT prophylaxis:SCD Code Status:DNR Family Communication:No family at bedside Disposition Plan:Pending stabilization of Afib with RVR Critical care time 45 minutes   Consultants:  PCCM  Oncology  Palliative care  Procedures:  None        Lab Results  Component Value Date   PLT 225 03/07/2018    Antibiotics  :   Anti-infectives (From admission, onward)   Start     Dose/Rate Route Frequency Ordered Stop   03/05/18 1503  clindamycin (CLEOCIN) 900 MG/50ML IVPB    Note to Pharmacy:  Sherle Poe   : cabinet override      03/05/18 1503 03/06/18 0314   03/05/18 1415  clindamycin (CLEOCIN) IVPB 900 mg     900 mg 100 mL/hr over 30 Minutes Intravenous  Once 03/05/18 1403 03/05/18 1554   03/04/18 1639  vancomycin (VANCOCIN) 1-5 GM/200ML-% IVPB    Note to Pharmacy:  Lesia Hausen   : cabinet override      03/04/18 1639 03/05/18 0444   03/04/18 1622  ceFAZolin (ANCEF) 2-4 GM/100ML-% IVPB  Status:  Discontinued    Note to Pharmacy:  Lesia Hausen   : cabinet override      03/04/18 1622 03/04/18 1638   02/28/18 1200  cefTRIAXone (ROCEPHIN) 2 g in sodium chloride 0.9 % 100 mL IVPB     2 g 200 mL/hr over 30 Minutes Intravenous Every 24 hours  02/27/18 1339 03/03/18 1408  02/25/18 1600  vancomycin (VANCOCIN) 1,250 mg in sodium chloride 0.9 % 250 mL IVPB  Status:  Discontinued     1,250 mg 166.7 mL/hr over 90 Minutes Intravenous Every 24 hours 02/23/2018 1711 02/25/18 0801   02/25/18 1200  cefTRIAXone (ROCEPHIN) 1 g in sodium chloride 0.9 % 100 mL IVPB  Status:  Discontinued     1 g 200 mL/hr over 30 Minutes Intravenous Every 24 hours 02/25/18 0927 02/27/18 1339   02/25/18 1200  azithromycin (ZITHROMAX) 500 mg in sodium chloride 0.9 % 250 mL IVPB     500 mg 250 mL/hr over 60 Minutes Intravenous Every 24 hours 02/25/18 0927 03/03/18 1514   02/17/2018 2200  aztreonam (AZACTAM) 2 g in sodium chloride 0.9 % 100 mL IVPB  Status:  Discontinued     2 g 200 mL/hr over 30 Minutes Intravenous Every 8 hours 02/04/2018 1651 02/25/18 0927   02/14/2018 1515  aztreonam (AZACTAM) 2 g in sodium chloride 0.9 % 100 mL IVPB     2 g 200 mL/hr over 30 Minutes Intravenous  Once 02/27/2018 1508 02/10/2018 1621   02/15/2018 1515  vancomycin (VANCOCIN) 2,000 mg in sodium chloride 0.9 % 500 mL IVPB     2,000 mg 250 mL/hr over 120 Minutes Intravenous  Once 02/17/2018 1509 02/19/2018 1821        Objective:   Vitals:   03/07/18 0300 03/07/18 0324 03/07/18 0400 03/07/18 0500  BP: (!) 129/47  (!) 120/53 (!) 113/34  Pulse: 74  87 78  Resp: 19  (!) 26 17  Temp:  97.8 F (36.6 C)    TempSrc:  Axillary    SpO2: 95%  97% 98%  Weight:      Height:        Wt Readings from Last 3 Encounters:  02/02/2018 104.3 kg (230 lb)  01/27/18 107 kg (236 lb)  12/10/17 108.4 kg (239 lb)     Intake/Output Summary (Last 24 hours) at 03/07/2018 0605 Last data filed at 03/07/2018 0500 Gross per 24 hour  Intake 720 ml  Output 1600 ml  Net -880 ml     Physical Exam  Awake Alert, Oriented X 3, No new F.N deficits, Normal affect [place].AT,PERRAL Supple Neck,No JVD, No cervical lymphadenopathy appriciated.  Symmetrical Chest wall movement, Good air movement bilaterally, slight decrease  in bs at right lung base, no wheezing, no crackle Irr, irr, s1, s2,  +ve B.Sounds, Abd Soft, No tenderness, No organomegaly appriciated, No rebound - guarding or rigidity. No Cyanosis, Clubbing or edema, No new Rash or bruise      Data Review:    CBC Recent Labs  Lab 03/02/18 0336 03/03/18 0344 03/04/18 0342 03/05/18 0320 03/06/18 0328 03/07/18 0327  WBC 10.4 10.2 12.1* 14.5* 18.8* 16.4*  HGB 9.5* 8.8* 9.1* 9.9* 9.8* 10.3*  HCT 31.2* 28.4* 29.8* 31.8* 32.0* 32.9*  PLT 343 343 365 360 248 225  MCV 93.7 92.8 92.8 93.5 95.0 95.1  MCH 28.5 28.8 28.3 29.1 29.1 29.8  MCHC 30.4 31.0 30.5 31.1 30.6 31.3  RDW 14.9 15.2 15.4 15.8* 15.9* 15.9*  LYMPHSABS 0.4* 0.4* 0.4* 0.7 0.6*  --   MONOABS 0.2 0.1 0.1 0.9 0.8  --   EOSABS 0.0 0.0 0.0 0.0 0.1  --   BASOSABS 0.0 0.0 0.0 0.0 0.0  --     Chemistries  Recent Labs  Lab 03/03/18 0344 03/04/18 0342 03/05/18 0320 03/06/18 0328 03/07/18 0327  NA 133* 137 137 137 135  K 4.6 4.6  4.1 3.5 3.5  CL 97* 101 97* 95* 91*  CO2 26 26 32 37* 37*  GLUCOSE 111* 123* 88 87 101*  BUN 41* 35* 41* 30* 17  CREATININE 0.65 0.69 0.75 0.59 0.59  CALCIUM 8.5* 8.6* 8.8* 8.3* 8.2*  AST  --   --   --   --  15  ALT  --   --   --   --  18  ALKPHOS  --   --   --   --  49  BILITOT  --   --   --   --  0.9   ------------------------------------------------------------------------------------------------------------------ No results for input(s): CHOL, HDL, LDLCALC, TRIG, CHOLHDL, LDLDIRECT in the last 72 hours.  No results found for: HGBA1C ------------------------------------------------------------------------------------------------------------------ No results for input(s): TSH, T4TOTAL, T3FREE, THYROIDAB in the last 72 hours.  Invalid input(s): FREET3 ------------------------------------------------------------------------------------------------------------------ No results for input(s): VITAMINB12, FOLATE, FERRITIN, TIBC, IRON, RETICCTPCT in the  last 72 hours.  Coagulation profile No results for input(s): INR, PROTIME in the last 168 hours.  No results for input(s): DDIMER in the last 72 hours.  Cardiac Enzymes No results for input(s): CKMB, TROPONINI, MYOGLOBIN in the last 168 hours.  Invalid input(s): CK ------------------------------------------------------------------------------------------------------------------    Component Value Date/Time   BNP 170.4 (H) 02/25/2018 0515    Inpatient Medications  Scheduled Meds: . amLODipine  10 mg Oral Daily  . budesonide (PULMICORT) nebulizer solution  0.5 mg Nebulization BID  . dextromethorphan-guaiFENesin  1 tablet Oral BID  . furosemide  40 mg Oral Daily  . levalbuterol  0.63 mg Nebulization TID  . metoprolol succinate  150 mg Oral Daily  . pantoprazole  40 mg Oral BID  . sodium chloride flush  10-40 mL Intracatheter Q12H   Continuous Infusions: PRN Meds:.acetaminophen **OR** acetaminophen, clonazePAM, diphenhydrAMINE, fluticasone, HYDROcodone-acetaminophen, levalbuterol, LORazepam, ondansetron **OR** ondansetron (ZOFRAN) IV, sodium chloride flush  Micro Results Recent Results (from the past 240 hour(s))  Culture, sputum-assessment     Status: None   Collection Time: 02/26/18  4:40 AM  Result Value Ref Range Status   Specimen Description SPUTUM  Final   Special Requests NONE  Final   Sputum evaluation   Final    Sputum specimen not acceptable for testing.  Please recollect.   [place] 5102 02/26/18 A NAVARRO Performed at [facility] [address], [place], [place] [address]    Report Status 02/26/2018 FINAL  Final  Urine Culture     Status: None   Collection Time: 02/26/18  5:45 PM  Result Value Ref Range Status   Specimen Description   Final    URINE, RANDOM Performed at [facility] [address] [place], [place] [address]    Special Requests   Final    NONE Performed at [facility], [place] [address], [place], [place] [address]    Culture   Final    NO GROWTH Performed at [facility], [place] [address] [place], [place] [address]    Report Status 02/28/2018 FINAL  Final    Radiology Reports Ct Chest W Contrast  Result Date: 02/19/2018 CLINICAL DATA:  Dyspnea. EXAM: CT CHEST WITH CONTRAST TECHNIQUE: Multidetector CT imaging of the chest was performed during intravenous contrast administration. CONTRAST:  45mL OMNIPAQUE IOHEXOL 300 MG/ML  SOLN COMPARISON:  PET scan of Feb 07, 2018.  CT scan of August 07, 2017. FINDINGS: Cardiovascular: Atherosclerosis of thoracic aorta is noted without aneurysm formation. Coronary artery calcifications  are noted. No pericardial effusion is noted. Mediastinum/Nodes: Thyroid gland and esophagus are unremarkable. Subcarinal adenopathy measuring 3.5 cm is noted. 2.4 cm right paratracheal adenopathy is noted. 12 mm right axillary lymph node is noted. Lungs/Pleura: No pneumothorax is noted. Continued presence of large mass in right lower lobe consistent with malignancy. Pleural spread of tumor is also noted and unchanged. Radiation fibrosis is noted medially in right upper lobe. Stable scarring or inflammation is noted anteriorly in the left upper lobe. Upper Abdomen: No acute abnormality. Musculoskeletal: No chest wall abnormality. No acute or significant osseous findings. IMPRESSION: Continued presence of large right lower lobe mass with pleural spread is noted consistent with history of lung cancer. 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 is well. Stable scarring or inflammation is noted anteriorly in the left upper lobe. Right paratracheal and subcarinal and right axillary adenopathy is noted consistent with metastatic disease. Coronary artery calcifications are noted suggesting coronary artery disease. Aortic Atherosclerosis (ICD10-I70.0). Electronically Signed   By: Marijo Conception, M.D.   On: 02/03/2018 19:10   Ct Angio  Chest Pe W Or Wo Contrast  Result Date: 02/27/2018 CLINICAL DATA:  Lung cancer, elevated D-dimer EXAM: CT ANGIOGRAPHY CHEST WITH CONTRAST TECHNIQUE: Multidetector CT imaging of the chest was performed using the standard protocol during bolus administration of intravenous contrast. Multiplanar CT image reconstructions and MIPs were obtained to evaluate the vascular anatomy. CONTRAST:  135mL ISOVUE-370 IOPAMIDOL (ISOVUE-370) INJECTION 76% COMPARISON:  CT chest dated 02/07/2018 FINDINGS: Cardiovascular: Satisfactory opacification the bilateral pulmonary arteries to the lobar level. No evidence of pulmonary embolism. 4.7 cm ascending thoracic aortic aneurysm, rapidly increasing from recent CT chest, when it measured 4.0 cm. Although unusual in appearance and suboptimally evaluated due to study technique, there is differential enhancement within the lumen, suggesting in ascending thoracic aortic dissection (series 6/image 44). This is favored to be a type A aortic dissection arising at the aortic root and terminating at the origin of the right brachiocephalic artery. Cardiomegaly.  No pericardial effusion. Atherosclerotic calcifications of the aortic arch. Three vessel coronary atherosclerosis. Enlargement of the main pulmonary artery, raising the possibility of pulmonary arterial hypertension. Mediastinum/Nodes: 13 mm short axis distinct subcarinal node. 2.0 cm short axis azygoesophageal recess node. Visualized thyroid is unremarkable. Lungs/Pleura: Large right lower lobe mass, unchanged from recent CT, with lymphangitic and pleural spread in the right hemithorax. Radiation changes in the anterior left upper lobe. Patchy left lower lobe opacity, likely atelectasis. Small pleural effusion. Mild centrilobular and paraseptal emphysematous changes, upper lobe predominant. No pneumothorax. Upper Abdomen: Visualized upper abdomen is grossly unremarkable, noting a mildly nodular hepatic contour. Musculoskeletal: Visualized  osseous structures are within normal limits. Review of the MIP images confirms the above findings. IMPRESSION: No evidence of pulmonary embolism. 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 Rapid enlargement of the ascending thoracic aorta, now measuring 4.7 cm, previously 4.0 cm on recent CT. Thoracic surgery consultation is suggested. Otherwise unchanged from recent CT and PET. Extensive tumor in the right lower lobe/hemithorax with pleural and nodal metastases, better evaluated on prior studies. Critical Value/emergent results were called by telephone at the time of interpretation on 02/27/2018 at 9:57 pm to Dr. Halford Chessman, who verbally acknowledged these results. Aortic Atherosclerosis (ICD10-I70.0) and Emphysema (ICD10-J43.9). Electronically Signed   By: Julian Hy M.D.   On: 02/27/2018 22:00  Dg Esophagus  Result Date: 02/26/2018 CLINICAL DATA:  Dysphagia.  Metastatic lung cancer. EXAM: ESOPHOGRAM/BARIUM SWALLOW TECHNIQUE: Single contrast examination was performed using  thin barium. FLUOROSCOPY TIME:  Fluoroscopy Time:  42 seconds Radiation Exposure Index (if provided by the fluoroscopic device): 26.1 mGy COMPARISON:  CT scan of the chest dated 02/12/2018 FINDINGS: There is a slight mass effect upon the right side of the esophagus just below the carina consistent with the adenopathy seen on chest x-ray. However, there is no stricture at that site. The patient has poor esophageal motility with a poor secondary stripping wave with to and fro peristalsis in the mid esophagus with intermittent tertiary contractions in the mid esophagus. There is a small hiatal hernia without a stricture. Note is made of a moderate right effusion as well as bibasilar lung consolidation, right greater than left. IMPRESSION: 1. Poor esophageal motility with to and fro  peristalsis in the mid esophagus with occasional tertiary contractions in the mid esophagus. 2. Slight mass effect on the mid esophagus due to subcarinal adenopathy. However, this does not cause significant narrowing of the esophagus. 3. Small hiatal hernia without a stricture. Electronically Signed   By: Lorriane Shire M.D.   On: 02/26/2018 16:02   Nm Pet Image Restag (ps) Skull Base To Thigh  Result Date: 02/08/2018 CLINICAL DATA:  Subsequent treatment strategy for lung cancer. EXAM: NUCLEAR MEDICINE PET SKULL BASE TO THIGH TECHNIQUE: 11.26 mCi F-18 FDG was injected intravenously. Full-ring PET imaging was performed from the skull base to thigh after the radiotracer. CT data was obtained and used for attenuation correction and anatomic localization. Fasting blood glucose: 113 mg/dl COMPARISON:  11/05/2017 FINDINGS: Mediastinal blood pool activity: SUV max 2.49 NECK: No hypermetabolic lymph nodes in the neck. Incidental CT findings: none CHEST: New hypermetabolic right paratracheal lymph node measures 2.1 cm and has an SUV max equal to 13.6. Newly hypermetabolic low right paratracheal lymph node measures 8 mm with an SUV max of 6.79. Subcarinal lymph node measures 1.7 cm and has an SUV max of 16.5. Previously 9 mm within SUV max of 6.3. Right lower lobe lung mass measures 7.6 by 7.6 cm within SUV max of 13.6. Previously this measured 5.1 x 5.6 cm within SUV max of 13.4. Adjacent nodule measures 1.2 cm within SUV max of 2.28. Previously 0.7 cm within SUV max of 5.05. Thickened rind of increased pleural soft tissue overlying the posterior and lateral right base is intensely hypermetabolic and increased from previous exam. Within the right posterior medial costophrenic sulcus pleural tumor has a thickness of 2.5 cm within SUV max of 14.6. Measured at the same level on previous exam this measured 1 cm within SUV max of 7.1. Again noted are advanced changes of external beam radiation within the paramediastinal right  lung including fibrosis and masslike architectural distortion. Similar appearance of nonspecific pneumonitis within the left upper lobe. Incidental CT findings: Moderate changes of emphysema. Aortic atherosclerosis identified. Calcifications within the LAD and left circumflex and RCA coronary arteries noted. ABDOMEN/PELVIS: No abnormal hypermetabolic activity within the liver, pancreas, adrenal glands, or spleen. No hypermetabolic lymph nodes in the abdomen or pelvis. Incidental CT findings: Gallstones are identified within the gallbladder. Aortic atherosclerosis without evidence for aneurysm. SKELETON: No focal hypermetabolic activity to suggest skeletal metastasis. Incidental CT findings: none IMPRESSION: 1. Significant interval progression of hypermetabolic tumor within the chest. 2. The dominant mass within the right lower lobe has increased in size in the interval. There has also been progressive pleural  spread of tumor overlying the posterior medial and lateral right lower lobe. 3. Interval progression of right paratracheal and subcarinal hypermetabolic adenopathy. 4. Aortic Atherosclerosis (ICD10-I70.0) and Emphysema (ICD10-J43.9). 5. LAD, left circumflex and RCA coronary artery atherosclerotic calcifications. Electronically Signed   By: Kerby Moors M.D.   On: 02/08/2018 22:18   Ir US Guide Vasc Access Right  Result Date: 03/05/2018 CLINICAL DATA:  Metastatic lung cancer EXAM: RIGHT INTERNAL JUGULAR SINGLE LUMEN POWER PORT CATHETER INSERTION Date:  03/05/2018 03/05/2018 4:14 pm Radiologist:  Jerilynn Mages. Daryll Brod, MD Guidance:  Ultrasound fluoroscopic MEDICATIONS: Clindamycin 600 mg IV; The antibiotic was administered within an appropriate time interval prior to skin puncture. ANESTHESIA/SEDATION: Versed 1.5 mg IV; Fentanyl 75 mcg IV; Moderate Sedation Time:  31 minutes The patient was continuously monitored during the procedure by the interventional radiology nurse under my direct supervision. FLUOROSCOPY TIME:   30 seconds (10 mGy) COMPLICATIONS: None immediate. CONTRAST:  None. PROCEDURE: Informed consent was obtained from the patient following explanation of the procedure, risks, benefits and alternatives. The patient understands, agrees and consents for the procedure. All questions were addressed. A time out was performed. Maximal barrier sterile technique utilized including caps, mask, sterile gowns, sterile gloves, large sterile drape, hand hygiene, and 2% chlorhexidine scrub. Under sterile conditions and local anesthesia, right internal jugular micropuncture venous access was performed. Access was performed with ultrasound. Images were obtained for documentation of the patent right internal jugular vein. A guide wire was inserted followed by a transitional dilator. This allowed insertion of a guide wire and catheter into the IVC. Measurements were obtained from the SVC / RA junction back to the right IJ venotomy site. In the right infraclavicular chest, a subcutaneous pocket was created over the second anterior rib. This was done under sterile conditions and local anesthesia. 1% lidocaine with epinephrine was utilized for this. A 2.5 cm incision was made in the skin. Blunt dissection was performed to create a subcutaneous pocket over the right pectoralis major muscle. The pocket was flushed with saline vigorously. There was adequate hemostasis. The port catheter was assembled and checked for leakage. The port catheter was secured in the pocket with two retention sutures. The tubing was tunneled subcutaneously to the right venotomy site and inserted into the SVC/RA junction through a valved peel-away sheath. Position was confirmed with fluoroscopy. Images were obtained for documentation. The patient tolerated the procedure well. No immediate complications. Incisions were closed in a two layer fashion with 4 - 0 Vicryl suture. Dermabond was applied to the skin. The port catheter was accessed, blood was aspirated  followed by saline and heparin flushes. Needle was removed. A dry sterile dressing was applied. IMPRESSION: Ultrasound and fluoroscopically guided right internal jugular single lumen power port catheter insertion. Tip in the SVC/RA junction. Catheter ready for use. Electronically Signed   By: Jerilynn Mages.  Shick M.D.   On: 03/05/2018 16:23   Dg Chest Port 1 View  Result Date: 03/06/2018 CLINICAL DATA:  Per order- acute respiratory failure Pt HX: COPD, HTN, ex smoker EXAM: PORTABLE CHEST 1 VIEW COMPARISON:  03/04/2018 FINDINGS: Patient is a RIGHT-sided PowerPort catheter tip overlying the level of superior vena cava. The cardiac silhouette is enlarged and stable in configuration. There is persistent significant opacity in the RIGHT LOWER lobe and associated RIGHT pleural effusion. There is improved aeration in the LEFT LOWER lobe. IMPRESSION: 1. Persistent opacity in the RIGHT lung. 2. Improved aeration in the LEFT lung. 3. Stable cardiomegaly Electronically Signed   By: Benjamine Mola  Owens Shark M.D.   On: 03/06/2018 07:06   Dg Chest Port 1 View  Result Date: 03/04/2018 CLINICAL DATA:  Acute respiratory failure. History of COPD, former smoker, lung malignancy. EXAM: PORTABLE CHEST 1 VIEW COMPARISON:  Portable chest x-ray of March 01, 2018 FINDINGS: The lungs are adequately inflated. Patchy airspace opacity in the left perihilar region persists but is slightly less conspicuous today. There is stable volume loss on the right with pleural thickening or pleural fluid overlying the superior and lateral aspects of the right lung. Increased density at the right lung base persists consistent with a known mass. The cardiac silhouette is enlarged. The pulmonary vascularity is mildly prominent. There is calcification in the wall of the aortic arch. The observed bony thorax is unremarkable. IMPRESSION: Persistent airspace opacity in the left perihilar region likely reflects pneumonia. Persistent volume loss on the right with increased mid and  lower right hemithorax density consistent with pleural fluid and a known mass. Persistent cardiomegaly with slightly increased pulmonary vascular prominence consistent with mild CHF. Electronically Signed   By: David  Martinique M.D.   On: 03/04/2018 07:20   Dg Chest Port 1 View  Result Date: 03/01/2018 CLINICAL DATA:  Acute respiratory failure. History of hypertension, lung cancer, COPD, PE, former smoker. EXAM: PORTABLE CHEST 1 VIEW COMPARISON:  Chest x-rays dated 02/27/2018 and 01/30/2018. FINDINGS: Cardiomegaly is stable. Overall cardiomediastinal silhouette is stable. Atherosclerotic changes again noted at the aortic arch. Stable opacity at the RIGHT lung base. New platelike opacity within the LEFT perihilar lung, pneumonia versus atelectasis. No pneumothorax seen. IMPRESSION: 1. Stable cardiomegaly. 2. Persistent opacity at the RIGHT lung base, compatible with the mass/consolidation demonstrated on recent chest CT of 02/27/2018. 3. New platelike opacity within the LEFT perihilar lung, pneumonia versus atelectasis, less likely asymmetric edema. Electronically Signed   By: Franki Cabot M.D.   On: 03/01/2018 07:37   Dg Chest Port 1 View  Result Date: 02/27/2018 CLINICAL DATA:  72 year old female with large right lower lung mass, lung cancer. Shortness of breath. EXAM: PORTABLE CHEST 1 VIEW COMPARISON:  Chest CT and radiographs 02/27/2018. FINDINGS: Portable AP semi upright view at 0433 hours. Continued dense right lower lung and retrocardiac opacity corresponding to the lung mass/drowned lung. Mildly increased pulmonary vascularity but no overt edema. Increased left lung base opacity most resembling atelectasis. No pneumothorax. No large pleural effusion. Stable cardiac size and mediastinal contours. Visualized tracheal air column is within normal limits. IMPRESSION: 1. Increased left lung base atelectasis and pulmonary vascular congestion since 01/30/2018. 2. Continued dense right lower lung opacification  due to lung mass and drowned lung. Electronically Signed   By: Genevie Ann M.D.   On: 02/27/2018 08:31   Dg Chest Port 1 View  Result Date: 02/20/2018 CLINICAL DATA:  Shortness of breath. EXAM: PORTABLE CHEST 1 VIEW COMPARISON:  Chest CT 02/07/2018 FINDINGS: Cardiomediastinal silhouette is normal. Calcific atherosclerotic disease and tortuosity of the aorta. There is no evidence of pneumothorax. Similar appearance of right hemithorax with airspace opacity in the right lower lobe, likely representing tumor and diffuse peribronchovascular infiltrate versus lymphangitic spread of tumor. Interval development of right pleural effusion. Chronic interstitial lung changes versus metastatic disease in the left upper lobe of the lung also stable. Osseous structures are without acute abnormality. Soft tissues are grossly normal. IMPRESSION: Similar abnormal appearance of the lungs with airspace opacity likely representing the known pulmonary mass in the right lower lobe, extensive peribronchial airspace consolidation versus lymphangitic spread of tumor. Interval enlargement of right  pleural effusion. Chronic interstitial lung scarring versus metastatic disease in the left upper lobe, stable. Electronically Signed   By: Fidela Salisbury M.D.   On: 02/25/2018 15:12   Ir Fluoro Guide Port Insertion Right  Result Date: 03/05/2018 CLINICAL DATA:  Metastatic lung cancer EXAM: RIGHT INTERNAL JUGULAR SINGLE LUMEN POWER PORT CATHETER INSERTION Date:  03/05/2018 03/05/2018 4:14 pm Radiologist:  Jerilynn Mages. Daryll Brod, MD Guidance:  Ultrasound fluoroscopic MEDICATIONS: Clindamycin 600 mg IV; The antibiotic was administered within an appropriate time interval prior to skin puncture. ANESTHESIA/SEDATION: Versed 1.5 mg IV; Fentanyl 75 mcg IV; Moderate Sedation Time:  31 minutes The patient was continuously monitored during the procedure by the interventional radiology nurse under my direct supervision. FLUOROSCOPY TIME:  30 seconds (10 mGy)  COMPLICATIONS: None immediate. CONTRAST:  None. PROCEDURE: Informed consent was obtained from the patient following explanation of the procedure, risks, benefits and alternatives. The patient understands, agrees and consents for the procedure. All questions were addressed. A time out was performed. Maximal barrier sterile technique utilized including caps, mask, sterile gowns, sterile gloves, large sterile drape, hand hygiene, and 2% chlorhexidine scrub. Under sterile conditions and local anesthesia, right internal jugular micropuncture venous access was performed. Access was performed with ultrasound. Images were obtained for documentation of the patent right internal jugular vein. A guide wire was inserted followed by a transitional dilator. This allowed insertion of a guide wire and catheter into the IVC. Measurements were obtained from the SVC / RA junction back to the right IJ venotomy site. In the right infraclavicular chest, a subcutaneous pocket was created over the second anterior rib. This was done under sterile conditions and local anesthesia. 1% lidocaine with epinephrine was utilized for this. A 2.5 cm incision was made in the skin. Blunt dissection was performed to create a subcutaneous pocket over the right pectoralis major muscle. The pocket was flushed with saline vigorously. There was adequate hemostasis. The port catheter was assembled and checked for leakage. The port catheter was secured in the pocket with two retention sutures. The tubing was tunneled subcutaneously to the right venotomy site and inserted into the SVC/RA junction through a valved peel-away sheath. Position was confirmed with fluoroscopy. Images were obtained for documentation. The patient tolerated the procedure well. No immediate complications. Incisions were closed in a two layer fashion with 4 - 0 Vicryl suture. Dermabond was applied to the skin. The port catheter was accessed, blood was aspirated followed by saline and  heparin flushes. Needle was removed. A dry sterile dressing was applied. IMPRESSION: Ultrasound and fluoroscopically guided right internal jugular single lumen power port catheter insertion. Tip in the SVC/RA junction. Catheter ready for use. Electronically Signed   By: Jerilynn Mages.  Shick M.D.   On: 03/05/2018 16:23    Time Spent in minutes  30   [name] M.D on 03/07/2018 at 6:05 AM  Between 7am to 7pm - Pager - [phone]    After 7pm go to [email or web address] - password [facility]  [facility] -  Office  [phone]

## 2018-03-08 MED ORDER — MOMETASONE FURO-FORMOTEROL FUM 200-5 MCG/ACT IN AERO
2.0000 | INHALATION_SPRAY | Freq: Two times a day (BID) | RESPIRATORY_TRACT | Status: DC
  Administered 2018-03-08 – 2018-03-15 (×13): 2 via RESPIRATORY_TRACT
  Filled 2018-03-08: qty 8.8

## 2018-03-08 MED ORDER — SODIUM CHLORIDE 0.9% FLUSH
10.0000 mL | Freq: Two times a day (BID) | INTRAVENOUS | Status: DC
  Administered 2018-03-08 – 2018-03-15 (×9): 10 mL

## 2018-03-08 MED ORDER — ASPIRIN 81 MG PO CHEW
324.0000 mg | CHEWABLE_TABLET | Freq: Every day | ORAL | Status: DC
  Administered 2018-03-08 – 2018-03-15 (×7): 324 mg via ORAL
  Filled 2018-03-08 (×7): qty 4

## 2018-03-08 MED ORDER — UMECLIDINIUM-VILANTEROL 62.5-25 MCG/INH IN AEPB
1.0000 | INHALATION_SPRAY | Freq: Every day | RESPIRATORY_TRACT | Status: DC
  Administered 2018-03-09 – 2018-03-15 (×6): 1 via RESPIRATORY_TRACT
  Filled 2018-03-08: qty 14

## 2018-03-08 MED ORDER — CHLORHEXIDINE GLUCONATE CLOTH 2 % EX PADS
6.0000 | MEDICATED_PAD | Freq: Every day | CUTANEOUS | Status: DC
  Administered 2018-03-08 – 2018-03-10 (×3): 6 via TOPICAL

## 2018-03-08 NOTE — Progress Notes (Signed)
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  Incidentally during her hospitalization she was found to have atrial fibrillation with RVR and a type a thoracic aortic dissection and was not felt to be a candidate for surgical treatment.   Assessment & Plan:   Principal Problem:   Acute on chronic respiratory failure with hypercapnia (HCC) Active Problems:   Obesity   COPD (chronic obstructive pulmonary disease) (HCC)   Essential hypertension   Squamous cell lung cancer, right (HCC)   Chronic diastolic heart failure (HCC)   OSA (obstructive sleep apnea)   Iron deficiency anemia   Anxiety disorder   Goals of care, counseling/discussion   COPD with acute exacerbation (HCC)   Pleural effusion   Community acquired pneumonia   Community acquired pneumonia of right lower lobe of lung ([place])   AKI (acute kidney injury) ([place])   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 steroids completed on 03/04/2018 -Continue ICS/LABA and L AMA/LABA -Patient has completed course of IV antibiotics for pneumonia -Strict ins and outs, sodium restricted diet, weigh daily  #) Obstructive sleep apnea/obesity hypoventilation  syndrome: -Overnight BiPAP  #) Atrial fibrillation with RVR: Currently her rate is under control.  Likely her underlying lung disease and body habitus is contributing.  She has had an echo 02/26/2018 that showed only diastolic dysfunction. -Continue metoprolol succinate 150 mg daily - We will discuss with patient about anticoagulation as not only does she have a type a thoracic aortic dissection but it appears to expanded from a recent CT -Continue enoxaparin 1 mg/kg  #) Type a thoracic aortic dissection: Patient is status post IV esmolol and nicardipine drips -Strict blood pressure control  #) AKI: Resolved  #) Squamous cell carcinoma of the lung: Noted to have right lower lobe mass with pleural spread. - She is on outpatient pembrolizumab therapy -Status post right IJ PowerPort on 03/05/2018  #) Hypertension: -Continue amlodipine 10 mg daily -Continue metoprolol succinate 150 mg daily -Hold home ARB and HCTZ  #) Psych/pain: -Continue PRN benzodiazepines  Fluids: Restrict Electrolytes: Monitor and supplement Nutrition: Heart healthy diet  Prophylaxis: Treatment dose Lovenox  Disposition: Likely to skilled nursing facility  DO NOT RESUSCITATE   Consultants:   Oncology  PCCM  Interventional radiology  Procedures:  Echo 02/28/2018:- Left ventricle: The cavity size was normal. Wall thickness was   increased in a pattern of mild LVH. Systolic function was normal.   The estimated ejection fraction was in the range of 55% to 60%.   Doppler parameters are consistent with both elevated ventricular   end-diastolic filling pressure and elevated left atrial filling    pressure.  Antimicrobials:   IV ceftriaxone and azithromycin from 02/09/2018 to 03/03/2018   Subjective: Patient reports she is doing somewhat better however continues to  report dyspnea.  She does not have any chest pain, nausea, palpitations, vomiting, diarrhea.  She does not quite want to discuss  anticoagulation at this time however requests that the situation be discussed with her sister.  Objective: Vitals:   03/08/18 0400 03/08/18 0745 03/08/18 0800 03/08/18 0823  BP: (!) 114/58 (!) 108/56 128/74   Pulse: 80 91 (!) 111 (!) 115  Resp: 14 (!) 29 (!) 22 (!) 22  Temp:   98.6 F (37 C)   TempSrc:   Oral   SpO2: 97% 98% 93% 95%  Weight:      Height:        Intake/Output Summary (Last 24 hours) at 03/08/2018 1010 Last data filed at 03/08/2018 2706 Gross per 24 hour  Intake 490 ml  Output 1150 ml  Net -660 ml   Filed Weights   02/22/2018 1437 03/07/18 1500  Weight: 104.3 kg (230 lb) 112.3 kg (247 lb 9.2 oz)    Examination:  General exam: No acute distress Respiratory system: Mildly increased work of breathing, anterior lung fields with scattered rhonchi, diffuse wheezing expiratory primarily, no crackles Cardiovascular system: Distant heart sounds, irregularly irregular, no murmurs Gastrointestinal system: Soft, nondistended, plus bowel sounds, no rebound or guarding Central nervous system: Alert and oriented. No focal neurological deficits. Extremities: 2+ lower extremity edema Skin: Port is clean dry intact Psychiatry: Judgement and insight appear normal. Mood & affect appropriate.     Data Reviewed: I have personally reviewed following labs and imaging studies  CBC: Recent Labs  Lab 03/02/18 0336 03/03/18 0344 03/04/18 0342 03/05/18 0320 03/06/18 0328 03/07/18 0327  WBC 10.4 10.2 12.1* 14.5* 18.8* 16.4*  NEUTROABS 9.7* 9.7* 11.5* 12.8* 17.3*  --   HGB 9.5* 8.8* 9.1* 9.9* 9.8* 10.3*  HCT 31.2* 28.4* 29.8* 31.8* 32.0* 32.9*  MCV 93.7 92.8 92.8 93.5 95.0 95.1  PLT 343 343 365 360 248 237   Basic Metabolic Panel: Recent Labs  Lab 03/03/18 0344 03/04/18 0342 03/05/18 0320 03/06/18 0328 03/07/18 0327  NA 133* 137 137 137 135  K 4.6 4.6 4.1 3.5 3.5  CL 97* 101 97* 95* 91*  CO2 26 26 32 37* 37*  GLUCOSE 111* 123* 88 87 101*  BUN 41* 35* 41* 30* 17    CREATININE 0.65 0.69 0.75 0.59 0.59  CALCIUM 8.5* 8.6* 8.8* 8.3* 8.2*   GFR: Estimated Creatinine Clearance: 84.6 mL/min (by C-G formula based on SCr of 0.59 mg/dL). Liver Function Tests: Recent Labs  Lab 03/07/18 0327  AST 15  ALT 18  ALKPHOS 49  BILITOT 0.9  PROT 5.5*  ALBUMIN 2.5*   No results for input(s): LIPASE, AMYLASE in the last 168 hours. No results for input(s): AMMONIA in the last 168 hours. Coagulation Profile: No results for input(s): INR, PROTIME in the last 168 hours. Cardiac Enzymes: Recent Labs  Lab 03/07/18 0957 03/07/18 1630 03/07/18 2136  TROPONINI 0.03* 0.04* 0.05*   BNP (last 3 results) No results for input(s): PROBNP in the last 8760 hours. HbA1C: No results for input(s): HGBA1C in the last 72 hours. CBG: No results for input(s): GLUCAP in the last 168 hours. Lipid Profile: No results for input(s): CHOL, HDL, LDLCALC, TRIG, CHOLHDL, LDLDIRECT in the last 72 hours. Thyroid Function Tests: No results for input(s): TSH, T4TOTAL, FREET4, T3FREE, THYROIDAB in the last 72 hours. Anemia Panel: No results for input(s): VITAMINB12, FOLATE, FERRITIN, TIBC, IRON, RETICCTPCT in the last 72 hours. Sepsis Labs: No results for input(s): PROCALCITON, LATICACIDVEN in the last  168 hours.  Recent Results (from the past 240 hour(s))  Urine Culture     Status: None   Collection Time: 02/26/18  5:45 PM  Result Value Ref Range Status   Specimen Description   Final    URINE, RANDOM Performed at [facility] [address], [place], [place] [address]    Special Requests   Final    NONE Performed at [facility], [place] [address] [place], [place] [address]    Culture   Final    NO GROWTH Performed at [facility], [place] [address] [place], [place] [address]    Report Status 02/28/2018 FINAL  Final         Radiology Studies: No results found.      Scheduled Meds: . amLODipine  5 mg Oral Daily  .  budesonide (PULMICORT) nebulizer solution  0.5 mg Nebulization BID  . Chlorhexidine Gluconate Cloth  6 each Topical Daily  . dextromethorphan-guaiFENesin  1 tablet Oral BID  . enoxaparin (LOVENOX) injection  1 mg/kg Subcutaneous Q12H  . furosemide  40 mg Oral Daily  . levalbuterol  0.63 mg Nebulization TID  . metoprolol succinate  150 mg Oral Daily  . pantoprazole  40 mg Oral BID  . protein supplement shake  11 oz Oral Q24H  . sodium chloride flush  10-40 mL Intracatheter Q12H  . sodium chloride flush  10-40 mL Intracatheter Q12H   Continuous Infusions:   LOS: 12 days    Time spent: Bessie, MD [facility]  If 7PM-7AM, please contact night-coverage [email or web address] Password [email or web address] 03/08/2018, 10:10 AM

## 2018-03-09 LAB — BASIC METABOLIC PANEL
Anion gap: 6 (ref 5–15)
Calcium: 8.1 mg/dL — ABNORMAL LOW (ref 8.9–10.3)
GFR calc Af Amer: >60 mL/min (ref >60)
GFR calc non Af Amer: >60 mL/min (ref >60)
Potassium: 3.8 mmol/L (ref 3.5–5.1)
Sodium: 135 mmol/L (ref 135–145)

## 2018-03-09 LAB — BASIC METABOLIC PANEL WITH GFR
BUN: 13 mg/dL (ref 6–20)
CO2: 41 mmol/L — ABNORMAL HIGH (ref 22–32)
Chloride: 88 mmol/L — ABNORMAL LOW (ref 101–111)
Creatinine, Ser: 0.62 mg/dL (ref 0.44–1.00)
Glucose, Bld: 136 mg/dL — ABNORMAL HIGH (ref 65–99)

## 2018-03-09 LAB — CBC
HCT: 30 % — ABNORMAL LOW (ref 36.0–46.0)
Hemoglobin: 9.2 g/dL — ABNORMAL LOW (ref 12.0–15.0)
MCH: 29.4 pg (ref 26.0–34.0)
MCHC: 30.7 g/dL (ref 30.0–36.0)
MCV: 95.8 fL (ref 78.0–100.0)
Platelets: 275 10*3/uL (ref 150–400)
RBC: 3.13 MIL/uL — ABNORMAL LOW (ref 3.87–5.11)
RDW: 16.5 % — ABNORMAL HIGH (ref 11.5–15.5)
WBC: 11 10*3/uL — ABNORMAL HIGH (ref 4.0–10.5)

## 2018-03-09 LAB — MAGNESIUM: Magnesium: 1.6 mg/dL — ABNORMAL LOW (ref 1.7–2.4)

## 2018-03-09 MED ORDER — METOPROLOL SUCCINATE ER 25 MG PO TB24
200.0000 mg | ORAL_TABLET | Freq: Every day | ORAL | Status: DC
  Administered 2018-03-09 – 2018-03-15 (×6): 200 mg via ORAL
  Filled 2018-03-09: qty 2
  Filled 2018-03-09: qty 8
  Filled 2018-03-09 (×4): qty 2

## 2018-03-09 MED ORDER — DIPHENHYDRAMINE HCL 25 MG PO CAPS
25.0000 mg | ORAL_CAPSULE | Freq: Four times a day (QID) | ORAL | Status: DC | PRN
  Administered 2018-03-11 – 2018-03-14 (×6): 25 mg via ORAL
  Filled 2018-03-09 (×6): qty 1

## 2018-03-09 MED ORDER — MAGNESIUM SULFATE 2 GM/50ML IV SOLN
2.0000 g | Freq: Once | INTRAVENOUS | Status: AC
  Administered 2018-03-09: 2 g via INTRAVENOUS
  Filled 2018-03-09: qty 50

## 2018-03-09 NOTE — Progress Notes (Signed)
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  Incidentally during her hospitalization she was found to have atrial fibrillation with RVR and a type a thoracic aortic dissection and was not felt to be a candidate for surgical treatment.   Assessment & Plan:   Principal Problem:   Acute on chronic respiratory failure with hypercapnia (HCC) Active Problems:   Obesity   COPD (chronic obstructive pulmonary disease) (HCC)   Essential hypertension   Squamous cell lung cancer, right (HCC)   Chronic diastolic heart failure (HCC)   OSA (obstructive sleep apnea)   Iron deficiency anemia   Anxiety disorder   Goals of care, counseling/discussion   COPD with acute exacerbation (HCC)   Pleural effusion   Community acquired pneumonia   Community acquired pneumonia of right lower lobe of lung ([place])   AKI (acute kidney injury) ([place])   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 steroids completed on 03/04/2018 -Continue ICS/LABA and L AMA/LABA -Patient has completed course of IV antibiotics for pneumonia -Strict ins and outs, sodium restricted diet, weigh daily -Transfer to telemetry bed  #) Obstructive sleep  apnea/obesity hypoventilation syndrome: -Overnight BiPAP  #) Atrial fibrillation with RVR: Patient continues to have elevated heart rates however this is improving.  On extensive discussion with CT surgery and patient's sister that we will hold off on anticoagulation and instead transition only to full dose aspirin. -Increase metoprolol succinate 2000 mg daily -Discontinue enoxaparin 1 mg/kg -Start aspirin 324 mg daily -Echo on 02/28/2018 shows normal EF  #) Type a thoracic aortic dissection: Patient is status post IV esmolol and nicardipine drips -Strict blood pressure control  #) AKI: Resolved  #) Squamous cell carcinoma of the lung: Noted to have right lower lobe mass with pleural spread. - She is on outpatient pembrolizumab therapy -Status post right IJ PowerPort on 03/05/2018  #) Hypertension: -Continue amlodipine 10 mg daily -Continue metoprolol succinate 200 mg daily -Hold home ARB and HCTZ  #) Psych/pain: -Continue PRN benzodiazepines  Fluids: Restrict Electrolytes: Monitor and supplement Nutrition: Heart healthy diet  Prophylaxis: Treatment dose Lovenox  Disposition: Likely to skilled nursing facility  DO NOT RESUSCITATE   Consultants:   Oncology  PCCM  Interventional radiology  Procedures:  Echo 02/28/2018:- Left ventricle: The cavity size was normal. Wall thickness was   increased in a pattern of mild LVH. Systolic function was normal.   The estimated ejection fraction was in the range of 55% to 60%.   Doppler parameters are consistent with both elevated ventricular   end-diastolic filling pressure and elevated left atrial filling    pressure.  Antimicrobials:   IV ceftriaxone and azithromycin from 02/11/2018 to 03/03/2018   Subjective: Patient reports she is doing better.  Her dyspnea has improved.  She denies any palpitations, chest pain,  nausea, vomiting, diarrhea.  Did have a discussion with her sister yesterday about need for anticoagulation versus  risks with possibly expanding thoracic aortic dissection.  They have opted to do full dose aspirin which was the recommendation of CT surgery.  Objective: Vitals:   03/09/18 0600 03/09/18 0700 03/09/18 0800 03/09/18 0826  BP: (!) 124/52 (!) 107/57 138/81   Pulse: (!) 106 92 (!) 103 (!) 132  Resp: 16 15 18 18   Temp:   98.5 F (36.9 C)   TempSrc:   Oral   SpO2: 99% 98% 97% 97%  Weight:      Height:        Intake/Output Summary (Last 24 hours) at 03/09/2018 5176 Last data filed at 03/09/2018 1607 Gross per 24 hour  Intake 750 ml  Output 800 ml  Net -50 ml   Filed Weights   02/07/2018 1437 03/07/18 1500  Weight: 104.3 kg (230 lb) 112.3 kg (247 lb 9.2 oz)    Examination:  General exam: No acute distress Respiratory system: No increased work of breathing,, anterior lung fields with scattered rhonchi, diffuse wheezing expiratory primarily, no crackles Cardiovascular system: Distant heart sounds, tachycardic, irregularly irregular, no murmurs Gastrointestinal system: Soft, nondistended, plus bowel sounds, no rebound or guarding Central nervous system: Alert and oriented. No focal neurological deficits. Extremities: 2+ lower extremity edema Skin: Port is clean dry intact Psychiatry: Judgement and insight appear normal. Mood & affect appropriate.     Data Reviewed: I have personally reviewed following labs and imaging studies  CBC: Recent Labs  Lab 03/03/18 0344 03/04/18 0342 03/05/18 0320 03/06/18 0328 03/07/18 0327 03/09/18 0500  WBC 10.2 12.1* 14.5* 18.8* 16.4* 11.0*  NEUTROABS 9.7* 11.5* 12.8* 17.3*  --   --   HGB 8.8* 9.1* 9.9* 9.8* 10.3* 9.2*  HCT 28.4* 29.8* 31.8* 32.0* 32.9* 30.0*  MCV 92.8 92.8 93.5 95.0 95.1 95.8  PLT 343 365 360 248 225 371   Basic Metabolic Panel: Recent Labs  Lab 03/04/18 0342 03/05/18 0320 03/06/18 0328 03/07/18 0327 03/09/18 0500  NA 137 137 137 135 135  K 4.6 4.1 3.5 3.5 3.8  CL 101 97* 95* 91* 88*  CO2 26 32 37* 37* 41*  GLUCOSE  123* 88 87 101* 136*  BUN 35* 41* 30* 17 13  CREATININE 0.69 0.75 0.59 0.59 0.62  CALCIUM 8.6* 8.8* 8.3* 8.2* 8.1*  MG  --   --   --   --  1.6*   GFR: Estimated Creatinine Clearance: 84.6 mL/min (by C-G formula based on SCr of 0.62 mg/dL). Liver Function Tests: Recent Labs  Lab 03/07/18 0327  AST 15  ALT 18  ALKPHOS 49  BILITOT 0.9  PROT 5.5*  ALBUMIN 2.5*   No results for input(s): LIPASE, AMYLASE in the last 168 hours. No results for input(s): AMMONIA in the last 168 hours. Coagulation Profile: No results for input(s): INR, PROTIME in the last 168 hours. Cardiac Enzymes: Recent Labs  Lab 03/07/18 0957 03/07/18 1630 03/07/18 2136  TROPONINI 0.03* 0.04* 0.05*   BNP (last 3 results) No results for input(s): PROBNP in the last 8760 hours. HbA1C: No results for input(s): HGBA1C in the last 72 hours. CBG: No results for input(s): GLUCAP in the last 168 hours. Lipid Profile: No results for input(s): CHOL, HDL, LDLCALC, TRIG, CHOLHDL, LDLDIRECT in the last 72 hours. Thyroid Function Tests: No results for input(s): TSH, T4TOTAL, FREET4, T3FREE, THYROIDAB in the last 72 hours. Anemia Panel: No results for input(s): VITAMINB12, FOLATE, FERRITIN,  TIBC, IRON, RETICCTPCT in the last 72 hours. Sepsis Labs: No results for input(s): PROCALCITON, LATICACIDVEN in the last 168 hours.  No results found for this or any previous visit (from the past 240 hour(s)).       Radiology Studies: No results found.      Scheduled Meds: . amLODipine  5 mg Oral Daily  . aspirin  324 mg Oral Daily  . Chlorhexidine Gluconate Cloth  6 each Topical Daily  . dextromethorphan-guaiFENesin  1 tablet Oral BID  . furosemide  40 mg Oral Daily  . levalbuterol  0.63 mg Nebulization TID  . metoprolol succinate  200 mg Oral Daily  . mometasone-formoterol  2 puff Inhalation BID  . pantoprazole  40 mg Oral BID  . protein supplement shake  11 oz Oral Q24H  . sodium chloride flush  10-40 mL  Intracatheter Q12H  . sodium chloride flush  10-40 mL Intracatheter Q12H  . umeclidinium-vilanterol  1 puff Inhalation Daily   Continuous Infusions:   LOS: 13 days    Time spent: [place], MD [facility]  If 7PM-7AM, please contact night-coverage [email or web address] Password [email or web address] 03/09/2018, 9:22 AM

## 2018-03-09 NOTE — Progress Notes (Signed)
PATIENT TRANSFERRED TO 1429 ON MONITOR; [place] @ 4L. ATTACHED TO ROOM MONITOR; NO SIGNIFICANT DISTRESS NOTED. PATIENTS BELONGINGS INCLUDING CELL PHONE AND CHARGER TRANSPORTED WITH PATIENT. CARE RELINQUISHED.

## 2018-03-10 LAB — CBC
HCT: 29.2 % — ABNORMAL LOW (ref 36.0–46.0)
Hemoglobin: 8.7 g/dL — ABNORMAL LOW (ref 12.0–15.0)
MCH: 29.1 pg (ref 26.0–34.0)
MCHC: 29.8 g/dL — ABNORMAL LOW (ref 30.0–36.0)
MCV: 97.7 fL (ref 78.0–100.0)
Platelets: 255 10*3/uL (ref 150–400)
RBC: 2.99 MIL/uL — ABNORMAL LOW (ref 3.87–5.11)
RDW: 16.7 % — ABNORMAL HIGH (ref 11.5–15.5)
WBC: 9.3 10*3/uL (ref 4.0–10.5)

## 2018-03-10 LAB — BASIC METABOLIC PANEL WITH GFR
BUN: 12 mg/dL (ref 6–20)
CO2: 41 mmol/L — ABNORMAL HIGH (ref 22–32)
Calcium: 7.8 mg/dL — ABNORMAL LOW (ref 8.9–10.3)
GFR calc non Af Amer: >60 mL/min (ref >60)
Glucose, Bld: 90 mg/dL (ref 65–99)

## 2018-03-10 LAB — BASIC METABOLIC PANEL
Anion gap: 7 (ref 5–15)
Chloride: 87 mmol/L — ABNORMAL LOW (ref 101–111)
Creatinine, Ser: 0.61 mg/dL (ref 0.44–1.00)
GFR calc Af Amer: >60 mL/min (ref >60)
Potassium: 3.9 mmol/L (ref 3.5–5.1)
Sodium: 135 mmol/L (ref 135–145)

## 2018-03-10 LAB — MAGNESIUM: Magnesium: 1.5 mg/dL — ABNORMAL LOW (ref 1.7–2.4)

## 2018-03-10 LAB — IRON AND TIBC
IRON: 24 ug/dL — AB (ref 28–170)
Saturation Ratios: 12 % (ref 10.4–31.8)
TIBC: 193 ug/dL — AB (ref 250–450)
UIBC: 169 ug/dL

## 2018-03-10 LAB — FERRITIN: Ferritin: 1176 ng/mL — ABNORMAL HIGH (ref 11–307)

## 2018-03-10 MED ORDER — MAGNESIUM SULFATE 4 GM/100ML IV SOLN
4.0000 g | Freq: Once | INTRAVENOUS | Status: AC
  Administered 2018-03-10: 4 g via INTRAVENOUS
  Filled 2018-03-10: qty 100

## 2018-03-10 MED ORDER — EPOETIN ALFA 20000 UNIT/ML IJ SOLN
40000.0000 [IU] | Freq: Once | INTRAMUSCULAR | Status: AC
  Administered 2018-03-10: 40000 [IU] via SUBCUTANEOUS
  Filled 2018-03-10: qty 1

## 2018-03-10 MED ORDER — SODIUM CHLORIDE 0.9% FLUSH: 10.0000 mL | INTRAVENOUS | Status: DC | PRN

## 2018-03-10 NOTE — Progress Notes (Signed)
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  Incidentally during her hospitalization she was found to have atrial fibrillation with RVR and a type a thoracic aortic dissection and was not felt to be a candidate for surgical treatment.   Assessment & Plan:   Principal Problem:   Acute on chronic respiratory failure with hypercapnia (HCC) Active Problems:   Obesity   COPD (chronic obstructive pulmonary disease) (HCC)   Essential hypertension   Squamous cell lung cancer, right (HCC)   Chronic diastolic heart failure (HCC)   OSA (obstructive sleep apnea)   Iron deficiency anemia   Anxiety disorder   Goals of care, counseling/discussion   COPD with acute exacerbation (HCC)   Pleural effusion   Community acquired pneumonia   Community acquired pneumonia of right lower lobe of lung ([place])   AKI (acute kidney injury) ([place])   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 steroids completed on 03/04/2018 -Continue ICS/LABA and L AMA/LABA -Patient has completed course of IV antibiotics for pneumonia -Strict ins and outs, sodium restricted diet, weigh daily - telemetry bed  #) Obstructive sleep  apnea/obesity hypoventilation syndrome: -Overnight BiPAP  #) Atrial fibrillation with RVR: Patient continues to have elevated heart rates however this is improving with up titration of beta-blocker.  CT surgery feels that due to type a dissection that aspirin would be best for anticoagulation for A. fib. -Continue metoprolol succinate 200 mg daily -Continue aspirin 324 mg daily -Echo on 02/28/2018 shows normal EF  #) Type a thoracic aortic dissection: Patient is status post IV esmolol and nicardipine drips -Strict blood pressure control  #) AKI: Resolved  #) Squamous cell carcinoma of the lung: Noted to have right lower lobe mass with pleural spread. - She is on outpatient pembrolizumab therapy -Status post right IJ PowerPort on 03/05/2018  #) Hypertension: -Continue amlodipine 10 mg daily -Continue metoprolol succinate 200 mg daily -Hold home ARB and HCTZ  #) Psych/pain: -Continue PRN benzodiazepines  Fluids: Restrict Electrolytes: Monitor and supplement Nutrition: Heart healthy diet  Prophylaxis: Treatment dose Lovenox  Disposition: skilled nursing facility  DO NOT RESUSCITATE   Consultants:   Oncology  PCCM  Interventional radiology  Procedures:  Echo 02/28/2018:- Left ventricle: The cavity size was normal. Wall thickness was   increased in a pattern of mild LVH. Systolic function was normal.   The estimated ejection fraction was in the range of 55% to 60%.   Doppler parameters are consistent with both elevated ventricular   end-diastolic filling pressure and elevated left atrial filling    pressure.  Antimicrobials:   IV ceftriaxone and azithromycin from 02/08/2018 to 03/03/2018   Subjective: Patient reports she is doing better.  Her dyspnea is improved since transfer.  She continues to be significantly weak.  She has not  had any nausea, vomiting, diarrhea.  Objective: Vitals:   03/09/18 1436 03/09/18 2126 03/10/18 0407 03/10/18 0859  BP: (!) 112/46 (!)  106/52 117/67   Pulse: 91 91 94   Resp: 18 16 18    Temp: 98.3 F (36.8 C) 98.9 F (37.2 C) 99.2 F (37.3 C)   TempSrc: Oral Oral Oral   SpO2: 100% 98% 96% 92%  Weight:      Height:        Intake/Output Summary (Last 24 hours) at 03/10/2018 0940 Last data filed at 03/10/2018 0730 Gross per 24 hour  Intake 240 ml  Output 1750 ml  Net -1510 ml   Filed Weights   02/23/2018 1437 03/07/18 1500 03/09/18 1050  Weight: 104.3 kg (230 lb) 112.3 kg (247 lb 9.2 oz) 113.5 kg (250 lb 3.6 oz)    Examination:  General exam: No acute distress Respiratory system: No increased work of breathing,, anterior lung fields with scattered rhonchi, no wheezing, no crackles Cardiovascular system: Distant heart sounds, tachycardic, irregularly irregular, no murmurs Gastrointestinal system: Soft, nondistended, plus bowel sounds, no rebound or guarding Central nervous system: Alert and oriented. No focal neurological deficits. Extremities: Trace lower extremity edema Skin: Port is clean dry intact Psychiatry: Judgement and insight appear normal. Mood & affect appropriate.     Data Reviewed: I have personally reviewed following labs and imaging studies  CBC: Recent Labs  Lab 03/04/18 0342 03/05/18 0320 03/06/18 0328 03/07/18 0327 03/09/18 0500 03/10/18 0417  WBC 12.1* 14.5* 18.8* 16.4* 11.0* 9.3  NEUTROABS 11.5* 12.8* 17.3*  --   --   --   HGB 9.1* 9.9* 9.8* 10.3* 9.2* 8.7*  HCT 29.8* 31.8* 32.0* 32.9* 30.0* 29.2*  MCV 92.8 93.5 95.0 95.1 95.8 97.7  PLT 365 360 248 225 275 416   Basic Metabolic Panel: Recent Labs  Lab 03/05/18 0320 03/06/18 0328 03/07/18 0327 03/09/18 0500 03/10/18 0417  NA 137 137 135 135 135  K 4.1 3.5 3.5 3.8 3.9  CL 97* 95* 91* 88* 87*  CO2 32 37* 37* 41* 41*  GLUCOSE 88 87 101* 136* 90  BUN 41* 30* 17 13 12   CREATININE 0.75 0.59 0.59 0.62 0.61  CALCIUM 8.8* 8.3* 8.2* 8.1* 7.8*  MG  --   --   --  1.6* 1.5*   GFR: Estimated Creatinine Clearance: 85.1 mL/min  (by C-G formula based on SCr of 0.61 mg/dL). Liver Function Tests: Recent Labs  Lab 03/07/18 0327  AST 15  ALT 18  ALKPHOS 49  BILITOT 0.9  PROT 5.5*  ALBUMIN 2.5*   No results for input(s): LIPASE, AMYLASE in the last 168 hours. No results for input(s): AMMONIA in the last 168 hours. Coagulation Profile: No results for input(s): INR, PROTIME in the last 168 hours. Cardiac Enzymes: Recent Labs  Lab 03/07/18 0957 03/07/18 1630 03/07/18 2136  TROPONINI 0.03* 0.04* 0.05*   BNP (last 3 results) No results for input(s): PROBNP in the last 8760 hours. HbA1C: No results for input(s): HGBA1C in the last 72 hours. CBG: No results for input(s): GLUCAP in the last 168 hours. Lipid Profile: No results for input(s): CHOL, HDL, LDLCALC, TRIG, CHOLHDL, LDLDIRECT in the last 72 hours. Thyroid Function Tests: No results for input(s): TSH, T4TOTAL, FREET4, T3FREE, THYROIDAB in the last 72 hours. Anemia Panel: No results for input(s): VITAMINB12, FOLATE, FERRITIN, TIBC, IRON, RETICCTPCT in the last 72 hours. Sepsis Labs: No results for input(s): PROCALCITON, LATICACIDVEN in the last 168 hours.  No results found for  this or any previous visit (from the past 240 hour(s)).       Radiology Studies: No results found.      Scheduled Meds: . amLODipine  5 mg Oral Daily  . aspirin  324 mg Oral Daily  . Chlorhexidine Gluconate Cloth  6 each Topical Daily  . dextromethorphan-guaiFENesin  1 tablet Oral BID  . furosemide  40 mg Oral Daily  . levalbuterol  0.63 mg Nebulization TID  . metoprolol succinate  200 mg Oral Daily  . mometasone-formoterol  2 puff Inhalation BID  . pantoprazole  40 mg Oral BID  . protein supplement shake  11 oz Oral Q24H  . sodium chloride flush  10-40 mL Intracatheter Q12H  . sodium chloride flush  10-40 mL Intracatheter Q12H  . umeclidinium-vilanterol  1 puff Inhalation Daily   Continuous Infusions: . magnesium sulfate 1 - 4 g bolus IVPB 4 g (03/10/18  0802)     LOS: 14 days    Time spent: [place], MD [facility]  If 7PM-7AM, please contact night-coverage [email or web address] Password [email or web address] 03/10/2018, 9:40 AM

## 2018-03-10 NOTE — Care Management Important Message (Signed)
Important Message  Patient Details  Name: Mariena Meares MRN: [id number] Date of Birth: Jan 15, 1946   Medicare Important Message Given:  Yes    Kerin Salen 03/10/2018, 1:35 PMImportant Message  Patient Details  Name: Gustava Berland MRN: [id number] Date of Birth: 04-Jun-1946   Medicare Important Message Given:  Yes    Kerin Salen 03/10/2018, 1:35 PM

## 2018-03-10 NOTE — Evaluation (Addendum)
Clinical/Bedside Swallow Evaluation Patient Details  Name: Alisha Fisher MRN: [id number] Date of Birth: Jun 03, 1946  Today's Date: 03/10/2018 Time: SLP Start Time (ACUTE ONLY): 0930 SLP Stop Time (ACUTE ONLY): 1015 SLP Time Calculation (min) (ACUTE ONLY): 45 min  Past Medical History:  Past Medical History:  Diagnosis Date  . Cancer ([place])    lung cancer  . COPD, severe ([place])   . Goals of care, counseling/discussion 02/12/2018  . HTN (hypertension)   . Iron deficiency anemia 11/07/2017  . Panic attack   . Pneumonia   . Pulmonary emphysema ([place])   . Sleep apnea    no cpap needs more recent sleep study  . SOB (shortness of breath)    Past Surgical History:  Past Surgical History:  Procedure Laterality Date  . CATARACT EXTRACTION    . ENDOBRONCHIAL ULTRASOUND Bilateral 12/17/2016   Procedure: ENDOBRONCHIAL ULTRASOUND;  Surgeon: Javier Glazier, MD;  Location: [facility] ENDOSCOPY;  Service: Cardiopulmonary;  Laterality: Bilateral;  . IR FLUORO GUIDE PORT INSERTION RIGHT  03/05/2018  . IR GENERIC HISTORICAL  12/21/2016   IR SINUS/FIST TUBE CHK-NON GI 12/21/2016 [facility]  . IR GENERIC HISTORICAL  12/21/2016   IR US GUIDE BX ASP/DRAIN 12/21/2016 Corrie Mckusick, DO [facility]  . IR RADIOLOGIST EVAL & MGMT  01/17/2017  . IR US GUIDE VASC ACCESS RIGHT  03/05/2018  . VIDEO BRONCHOSCOPY WITH ENDOBRONCHIAL ULTRASOUND N/A 12/10/2017   Procedure: VIDEO BRONCHOSCOPY WITH ENDOBRONCHIAL ULTRASOUND;  Surgeon: Collene Gobble, MD;  Location: [facility] OR;  Service: Thoracic;  Laterality: N/A;  . WISDOM TOOTH EXTRACTION     HPI:  pt is a 72 yo female adm to [facility] with respiratory difficulties.  PMH + for lung cancer s/p XRT, COPD, panic attacks, dysphagia.  Pt with no effusion  - ?pna and squamous cell carcinoma of right lung.  Pt reports problems swallowing since her radiation tx -  intermittently-  She states she will choke on saliva and regurgitate non-digested food.  She also states she will choke on liquids.  Pt  underwent an esophageal evaluation - barium swallow that showed poor esophageal motility, slight mass effect on mid esophagus (did not cause significant narrowing of esophagus) due to adenocarcinoma, small hiatal hernia without stricture.  Follow up SLP to assess tolerance of diet and help mitigate dysphagia.  Per RN yesterday, pt became choked on roast beef.    Pt today found to be coughing overtly after intake with HOB lowered. SlP was reordered du to aspiration concerns.     Assessment / Plan / Recommendation Clinical Impression  Suspect pt's primary aspiration risk is due to known esophageal deficits from XRT - However pt demonstrating decreased awareness and trying to drink tea with HOB nearly flat!  Strongly advised pt to only eat sitting as upright as can manage- at least 45*. She does report discomfort with HOB fully upright.  Coughing and expectoration noted with coffee and cream much more than with iced tea.  SLP showed pt video clip of her disordered esophageal swallow vs a normal barium swallow to improve her understanding of deficits.  She denies improvement with thicker liquids vs thin.    Suspect component of pharyngeal aspiration during the swallow as pt with immediate overt coughing, face turning red and expectoration after swallow of coffee.  Advised pt to consider MBS to allow instrumental eval of pharyngeal swallow - she declines at this time.     Will follow up to determine if pt desires MBS and to reinforce  mitigation strategies.  Pt's compliance with strategies impaired during initial swallow evaluation and today - likely decreasing her po tolerance.  RN made aware. SlP locked HOB for no lower than 30* and made RN aware.  SLP Visit Diagnosis: Dysphagia, unspecified (R13.10)    Aspiration Risk  Severe aspiration risk;Moderate aspiration risk    Diet Recommendation Thin liquid;Dysphagia 3 (Mech soft)   Liquid Administration via: Cup;No straw Medication Administration: Whole  meds with liquid(as tolerated) Supervision: Patient able to self feed Compensations: Small sips/bites;Slow rate Postural Changes: Remain upright for at least 30 minutes after po intake(sit upright as much as able for intake!)    Other  Recommendations Recommended Consults: Consider esophageal assessment Oral Care Recommendations: Oral care BID   Follow up Recommendations        Frequency and Duration min 1 x/week  1 week       Prognosis Prognosis for Safe Diet Advancement: Fair Barriers to Reach Goals: Other (Comment)(diagnosis)      Swallow Study   General Date of Onset: 03/10/18 HPI: pt is a 72 yo female adm to [facility] with respiratory difficulties.  PMH + for lung cancer s/p XRT, COPD, panic attacks, dysphagia.  Pt with no effusion  - ?pna and squamous cell carcinoma of right lung.  Pt reports problems swallowing since her radiation tx -  intermittently-  She states she will choke on saliva and regurgitate non-digested food.  She also states she will choke on liquids.  Pt underwent an esophageal evaluation - barium swallow that showed poor esophageal motility, slight mass effect on mid esophagus (did not cause significant narrowing of esophagus) due to adenocarcinoma, small hiatal hernia without stricture.  Follow up SLP to assess tolerance of diet and help mitigate dysphagia.  Per RN yesterday, pt became choked on roast beef.    Pt today found to be coughing overtly after intake with HOB lowered. SlP was reordered du to aspiration concerns.   Type of Study: Bedside Swallow Evaluation Previous Swallow Assessment: esophagram showed dysmotility Diet Prior to this Study: Regular;Thin liquids Temperature Spikes Noted: No Respiratory Status: Nasal cannula History of Recent Intubation: No Behavior/Cognition: Alert;Other (Comment);Impulsive;Distractible(pt repeatedly stated "I have a lot going on" deflecting from her dysphagia) Oral Cavity Assessment: Within Functional Limits Oral Care  Completed by SLP: No Oral Cavity - Dentition: Adequate natural dentition Vision: Functional for self-feeding Self-Feeding Abilities: Able to feed self Patient Positioning: Partially reclined(pt allowed HOB to 51* only) Baseline Vocal Quality: Normal Volitional Cough: Strong(productive to secretions mixed with coffee) Volitional Swallow: Able to elicit    Oral/Motor/Sensory Function Overall Oral Motor/Sensory Function: Within functional limits   Ice Chips Ice chips: Not tested   Thin Liquid Thin Liquid: Impaired Presentation: Cup;Straw Pharyngeal  Phase Impairments: Cough - Immediate;Cough - Delayed    Nectar Thick Nectar Thick Liquid: Not tested   Honey Thick Honey Thick Liquid: Not tested   Puree Puree: Not tested Other Comments: pt declined to consume anything more than liquids   Solid   GO   Solid: Not tested Other Comments: pt declined to consume anything more than liquids        Macario Golds 03/10/2018,10:26 AM  Luanna Salk, [place] [facility] SLP [phone]

## 2018-03-10 NOTE — Progress Notes (Signed)
Patient eating breakfast. RN sat patient straight up in the bed to eat breakfast and educated patient on importance of sitting upright to eat and drink due to aspiration risk and difficulty with swallowing. NT responded to call light and found patient lying down and chocking on her breakfast. NT sat patient up in the bed and RN responded as well. RN again educated the patient on the importance of sitting upright when eating or when chocking. Patient coughed up a moderate amount of thick phlegm and food particles. HR during her chocking/coughing spell sustained 120-140's. MD made aware. No new orders at this time. Will continue to monitor HR to ensure it returns to baseline as patient rests.   Alisha Fisher [facility] 03/10/2018 9:09 AM

## 2018-03-11 ENCOUNTER — Inpatient Hospital Stay (HOSPITAL_COMMUNITY): Payer: Medicare Other

## 2018-03-11 DIAGNOSIS — J9601 Acute respiratory failure with hypoxia: Secondary | ICD-10-CM

## 2018-03-11 LAB — COMPREHENSIVE METABOLIC PANEL
ALT: 14 U/L (ref 14–54)
AST: 13 U/L — ABNORMAL LOW (ref 15–41)
Anion gap: 10 (ref 5–15)
BUN: 11 mg/dL (ref 6–20)
CO2: 40 mmol/L — ABNORMAL HIGH (ref 22–32)
Chloride: 86 mmol/L — ABNORMAL LOW (ref 101–111)
Creatinine, Ser: 0.54 mg/dL (ref 0.44–1.00)
GFR calc Af Amer: >60 mL/min (ref >60)
Sodium: 136 mmol/L (ref 135–145)

## 2018-03-11 LAB — CBC
HCT: 29.5 % — ABNORMAL LOW (ref 36.0–46.0)
Hemoglobin: 9 g/dL — ABNORMAL LOW (ref 12.0–15.0)
MCH: 30.1 pg (ref 26.0–34.0)
MCHC: 30.5 g/dL (ref 30.0–36.0)
MCV: 98.7 fL (ref 78.0–100.0)
Platelets: 260 K/uL (ref 150–400)
RBC: 2.99 MIL/uL — ABNORMAL LOW (ref 3.87–5.11)
RDW: 16.8 % — ABNORMAL HIGH (ref 11.5–15.5)
WBC: 10.9 K/uL — ABNORMAL HIGH (ref 4.0–10.5)

## 2018-03-11 LAB — COMPREHENSIVE METABOLIC PANEL WITH GFR
Albumin: 2.5 g/dL — ABNORMAL LOW (ref 3.5–5.0)
Alkaline Phosphatase: 67 U/L (ref 38–126)
Calcium: 8.1 mg/dL — ABNORMAL LOW (ref 8.9–10.3)
GFR calc non Af Amer: >60 mL/min (ref >60)
Glucose, Bld: 110 mg/dL — ABNORMAL HIGH (ref 65–99)
Potassium: 4 mmol/L (ref 3.5–5.1)
Total Bilirubin: 0.6 mg/dL (ref 0.3–1.2)
Total Protein: 5.8 g/dL — ABNORMAL LOW (ref 6.5–8.1)

## 2018-03-11 LAB — MAGNESIUM: Magnesium: 1.9 mg/dL (ref 1.7–2.4)

## 2018-03-11 MED ORDER — LIDOCAINE HCL 1 % IJ SOLN
INTRAMUSCULAR | Status: AC
  Filled 2018-03-11: qty 10

## 2018-03-11 MED ORDER — IOPAMIDOL (ISOVUE-370) INJECTION 76%
100.0000 mL | Freq: Once | INTRAVENOUS | Status: AC | PRN
  Administered 2018-03-11: 100 mL via INTRAVENOUS

## 2018-03-11 MED ORDER — IOPAMIDOL (ISOVUE-370) INJECTION 76%
INTRAVENOUS | Status: AC
  Filled 2018-03-11: qty 100

## 2018-03-11 NOTE — Progress Notes (Signed)
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  03/11/2018: Patient seen and examined at her bedside.  She reports persistent productive cough.  Later this morning RN reports complaints of mild chest tightness.  Chest x-ray repeated, personally reviewed and revealed worsening infiltrates on the right lower mid and upper lungs as well as a right pleural effusion.  CT angio aorta ordered to further evaluate thoracic aorta dissection as well as right thoracentesis.     Assessment/Plan: Principal Problem:   Acute on chronic respiratory failure with hypercapnia (HCC) Active Problems:   Obesity   COPD (chronic obstructive pulmonary disease) (HCC)   Essential hypertension   Squamous cell lung cancer, right (HCC)   Chronic diastolic heart failure (HCC)   OSA (obstructive sleep apnea)   Iron deficiency anemia   Anxiety disorder   Goals of care, counseling/discussion   COPD with acute exacerbation (HCC)   Pleural effusion   Community acquired pneumonia   Community acquired pneumonia of right lower lobe of lung ([place])   AKI (acute kidney injury) ([place])   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 Continue O2 supplementation to maintain O2 saturation 92% or greater On oral Lasix 40 mg daily Monitor strict I's and O's Continue  Dulera Continue Anoro Ellipta Continue Xopenex  Right pleural effusion  Suspect secondary to malignancy Right thoracentesis ordered Awaiting cytology results  Right lower lobe squamous cell carcinoma Patient is on outpatient chemotherapy Status post right IJ Port cath placement on 03/05/2018  Thoracic aortic dissection CTA revealed rapid increase of aneurysm from 4.02 4.7 cm with high suspicion for dissection Repeat CTA chest aorta today Strict blood pressure control  OSA/obesity hypoventilation syndrome Overnight BiPAP  Chronic A. fib with RVR On metoprolol 620 mg daily On aspirin 324 mg daily Last echo done on 02/28/2018 revealed normal EF with no wall motion abnormality      Code Status: DNR  Family Communication: Son at bedside  Disposition Plan: Home/SNF when clinically stable   Consultants:  Palliative care  Oncology  PCCM  Interventional radiology  Procedures:  right IJ Port-A-Cath placement  Antimicrobials:  Completed IV antibiotics IV ceftriaxone and IV azithromycin  DVT prophylaxis: SCDs   Objective: Vitals:   03/11/18 0607 03/11/18 0840 03/11/18 1158 03/11/18 1222  BP: (!) 113/57   113/61  Pulse: 76   (!) 112  Resp: 16   (!) 24  Temp: 99 F (37.2 C)     TempSrc: Oral     SpO2: 100% 99% 98% 92%  Weight:      Height:        Intake/Output Summary (Last 24 hours) at 03/11/2018 1321 Last data filed at 03/11/2018 0515 Gross per 24 hour  Intake -  Output 700 ml  Net -700 ml   Filed Weights   02/16/2018 1437 03/07/18 1500 03/09/18 1050  Weight: 104.3 kg (230 lb) 112.3 kg (247 lb 9.2 oz) 113.5 kg (250 lb  3.6 oz)    Exam:  . General: 72 y.o. year-old female well developed well nourished in no acute distress.  Alert and oriented x3. . Cardiovascular: Regular rate and rhythm with no rubs or gallops.  No thyromegaly or JVD noted.   Marland Kitchen Respiratory:  mild diffuse rales bilaterally with no wheezes present.  Poor inspiratory  effort. . Abdomen: Soft nontender nondistended with normal bowel sounds x4 quadrants. . Musculoskeletal: Trace lower extremity edema bilaterally.  Marland Kitchen Psychiatry: Mood is irritable.   Data Reviewed: CBC: Recent Labs  Lab 03/05/18 0320 03/06/18 0328 03/07/18 0327 03/09/18 0500 03/10/18 0417 03/11/18 0456  WBC 14.5* 18.8* 16.4* 11.0* 9.3 10.9*  NEUTROABS 12.8* 17.3*  --   --   --   --   HGB 9.9* 9.8* 10.3* 9.2* 8.7* 9.0*  HCT 31.8* 32.0* 32.9* 30.0* 29.2* 29.5*  MCV 93.5 95.0 95.1 95.8 97.7 98.7  PLT 360 248 225 275 255 983   Basic Metabolic Panel: Recent Labs  Lab 03/06/18 0328 03/07/18 0327 03/09/18 0500 03/10/18 0417 03/11/18 0456  NA 137 135 135 135 136  K 3.5 3.5 3.8 3.9 4.0  CL 95* 91* 88* 87* 86*  CO2 37* 37* 41* 41* 40*  GLUCOSE 87 101* 136* 90 110*  BUN 30* 17 13 12 11   CREATININE 0.59 0.59 0.62 0.61 0.54  CALCIUM 8.3* 8.2* 8.1* 7.8* 8.1*  MG  --   --  1.6* 1.5* 1.9   GFR: Estimated Creatinine Clearance: 85.1 mL/min (by C-G formula based on SCr of 0.54 mg/dL). Liver Function Tests: Recent Labs  Lab 03/07/18 0327 03/11/18 0456  AST 15 13*  ALT 18 14  ALKPHOS 49 67  BILITOT 0.9 0.6  PROT 5.5* 5.8*  ALBUMIN 2.5* 2.5*   No results for input(s): LIPASE, AMYLASE in the last 168 hours. No results for input(s): AMMONIA in the last 168 hours. Coagulation Profile: No results for input(s): INR, PROTIME in the last 168 hours. Cardiac Enzymes: Recent Labs  Lab 03/07/18 0957 03/07/18 1630 03/07/18 2136  TROPONINI 0.03* 0.04* 0.05*   BNP (last 3 results) No results for input(s): PROBNP in the last 8760 hours. HbA1C: No results for input(s): HGBA1C in the last 72 hours. CBG: No results for input(s): GLUCAP in the last 168 hours. Lipid Profile: No results for input(s): CHOL, HDL, LDLCALC, TRIG, CHOLHDL, LDLDIRECT in the last 72 hours. Thyroid Function Tests: No results for input(s): TSH, T4TOTAL, FREET4, T3FREE, THYROIDAB in the last 72 hours. Anemia  Panel: Recent Labs    03/10/18 0915  FERRITIN 1,176*  TIBC 193*  IRON 24*   Urine analysis:    Component Value Date/Time   COLORURINE YELLOW 02/26/2018 1627   APPEARANCEUR CLEAR 02/26/2018 1627   LABSPEC 1.018 02/26/2018 1627   PHURINE 5.0 02/26/2018 1627   GLUCOSEU NEGATIVE 02/26/2018 1627   HGBUR NEGATIVE 02/26/2018 1627   BILIRUBINUR NEGATIVE 02/26/2018 1627   KETONESUR NEGATIVE 02/26/2018 1627   PROTEINUR NEGATIVE 02/26/2018 1627   NITRITE NEGATIVE 02/26/2018 1627   LEUKOCYTESUR NEGATIVE 02/26/2018 1627   Sepsis Labs: @LABRCNTIP (procalcitonin:4,lacticidven:4)  )No results found for this or any previous visit (from the past 240 hour(s)).    Studies: Dg Chest Port 1 View  Result Date: 03/11/2018 CLINICAL DATA:  Rales EXAM: PORTABLE CHEST 1 VIEW COMPARISON:  03/06/2018 FINDINGS: Right Port-A-Cath remains in place, unchanged. Cardiomegaly with vascular congestion. Moderate, likely loculated right pleural effusion. Worsening aeration throughout the right lung with increasing diffuse right lung airspace disease. Increasing left upper lobe airspace  opacity. IMPRESSION: Worsening airspace disease diffusely throughout the right lung and in the left upper lobe. Moderate, loculated right pleural effusion, slightly increased. Cardiomegaly, vascular congestion. Electronically Signed   By: Rolm Baptise M.D.   On: 03/11/2018 09:31    Scheduled Meds: . amLODipine  5 mg Oral Daily  . aspirin  324 mg Oral Daily  . dextromethorphan-guaiFENesin  1 tablet Oral BID  . furosemide  40 mg Oral Daily  . levalbuterol  0.63 mg Nebulization TID  . metoprolol succinate  200 mg Oral Daily  . mometasone-formoterol  2 puff Inhalation BID  . pantoprazole  40 mg Oral BID  . protein supplement shake  11 oz Oral Q24H  . sodium chloride flush  10-40 mL Intracatheter Q12H  . sodium chloride flush  10-40 mL Intracatheter Q12H  . umeclidinium-vilanterol  1 puff Inhalation Daily    Continuous  Infusions:   LOS: 15 days     Kayleen Memos, MD [facility] Pager [phone]  If 7PM-7AM, please contact night-coverage [email or web address] Password [email or web address] 03/11/2018, 1:21 PM

## 2018-03-11 NOTE — Progress Notes (Signed)
Patient does not want to wear CPAP tonight.

## 2018-03-11 NOTE — Progress Notes (Addendum)
SLP Cancellation Note  Patient Details Name: Earnstine Meinders MRN: [id number] DOB: Apr 13, 1946   Cancelled treatment:       Reason Eval/Treat Not Completed: Other (comment)(SLP attempted to see pt, bed was wet and PT helping pt to bedside commode, pt declines participating in [place] stating "today is a bad day")  Note pt for potential thoracentesis today.  Will continue efforts.    Luanna Salk, [facility] SLP [phone]

## 2018-03-11 NOTE — Progress Notes (Signed)
Palliative Care has been following patient. She seems to be improving steadily. Recommendation for post-acute palliative care referral at SNF so that MOST form can be completed and plans made for when she declines again and preferences for care. Send golden rod DNR at discharge. Palliative consult note routed to PCP. For now we will sign off but please call us if her condition changes or we can be of assistance.  Lane Hacker, DO Palliative Medicine [phone]

## 2018-03-11 NOTE — Progress Notes (Signed)
   03/11/18 1222  Vitals  BP 113/61  MAP (mmHg) 76  BP Location Left Arm  BP Method Automatic  Patient Position (if appropriate) Lying  Pulse Rate (!) 112  Pulse Rate Source Monitor  Resp (!) 24  Oxygen Therapy  SpO2 92 %  O2 Device Nasal Cannula  O2 Flow Rate (L/min) 3 L/min   Patient c/o heaviness in her mid chest. Patient did not appear to have dyspnea or SOB worsened from her baseline. EKG obtained and showed Afib HR 97. Vital signs stable as above. Patient given PRN dose of IV ativan for anxiety and RT came to bedside to give a PRN nebulizer treatment. Patient reported after these therapies that the chest heaviness was a little better but still there. She stated she felt like it was better because the staff were in the room doing treatments and she was not thinking about it as much. MD notified of events and will continue to monitor patient.   Othella Boyer [facility] 03/11/2018 12:29 PM

## 2018-03-11 NOTE — Progress Notes (Signed)
Physical Therapy Treatment Patient Details Name: Alisha Fisher MRN: [id number] DOB: March 31, 1946 Today's Date: 03/11/2018    History of Present Illness 72 y.o. female with a past medical history significant for Squamous cell carcinoma Lung locally advanced, COPD home O2 3L, wheelchair bound, morbid obesity, OSA not on CPAP, dCHF, chronic IDA, and HTN who presents with 4-5 days malaise, progressive cough and now dyspnea and admitted for acute on chronic respiratory failure with hypoxia and hypercapnia    PT Comments    Pt in bed soaked in urine.  Peri wick in place.  Assisted OOB.  General transfer comment: several attempts with minimal effort and excuses why "I can't"  Assisted from elevated bed to [facility] + 1 assist then from [facility] to recliner + 2 assist all though I believe pt could have done more physically to self assist.  Pt required assist with peri care and positioning.  Left in recliner sitting upright with lunch tray.    Follow Up Recommendations  SNF     Equipment Recommendations  None recommended by PT    Recommendations for Other Services       Precautions / Restrictions Precautions Precautions: Fall Precaution Comments: monitor sats Restrictions Weight Bearing Restrictions: No    Mobility  Bed Mobility Overal bed mobility: Needs Assistance Bed Mobility: Supine to Sit     Supine to sit: Mod assist     General bed mobility comments: Assist for trunk and bil LEs. Multimodal cuing and Mod encouragement required. Bed was soaked in urine so Therapist was insistant pt get OOB.    Transfers Overall transfer level: Needs assistance Equipment used: Rolling walker (2 wheeled) Transfers: Sit to/from [facility] Sit to Stand: Mod assist;+2 safety/equipment;+2 physical assistance;From elevated surface Stand pivot transfers: Mod assist;+2 physical assistance;+2 safety/equipment;From elevated surface       General transfer comment: several attempts with minimal  effort and excuses why "I can't"  Assisted from elevated bed to [facility] + 1 assist then from [facility] to recliner + 2 assist all though I believe pt could have done more physically to self assist.  Ambulation/Gait             General Gait Details: pt refused to take any forward steps   Stairs             Wheelchair Mobility    Modified Rankin (Stroke Patients Only)       Balance                                            Cognition Arousal/Alertness: Awake/alert Behavior During Therapy: Anxious                                   General Comments: pt puts forth minimal effort with multiple reasons/issues  "I can't" several times      Exercises      General Comments        Pertinent Vitals/Pain Pain Assessment: No/denies pain    Home Living                      Prior Function            PT Goals (current goals can now be found in the care plan section) Progress towards PT goals: Progressing toward goals  Frequency    Min 2X/week      PT Plan Current plan remains appropriate    Co-evaluation              AM-PAC PT "6 Clicks" Daily Activity  Outcome Measure  Difficulty turning over in bed (including adjusting bedclothes, sheets and blankets)?: A Lot Difficulty moving from lying on back to sitting on the side of the bed? : A Lot Difficulty sitting down on and standing up from a chair with arms (e.g., wheelchair, bedside commode, etc,.)?: A Lot Help needed moving to and from a bed to chair (including a wheelchair)?: A Lot Help needed walking in hospital room?: A Lot Help needed climbing 3-5 steps with a railing? : Total 6 Click Score: 11    End of Session Equipment Utilized During Treatment: Gait belt;Oxygen Activity Tolerance: Other (comment)(pt self limiting) Patient left: in chair;with call bell/phone within reach Nurse Communication: Mobility status;Need for lift equipment PT Visit Diagnosis:  Difficulty in walking, not elsewhere classified (R26.2);Muscle weakness (generalized) (M62.81)     Time: 3383-2919 PT Time Calculation (min) (ACUTE ONLY): 35 min  Charges:  $Therapeutic Activity: 23-37 mins                    G Codes:       Rica Koyanagi  PTA [facility]  Acute  Rehab Pager      [phone]

## 2018-03-12 ENCOUNTER — Inpatient Hospital Stay: Payer: Medicare Other

## 2018-03-12 ENCOUNTER — Inpatient Hospital Stay: Payer: Medicare Other | Admitting: Hematology & Oncology

## 2018-03-12 LAB — LACTIC ACID, PLASMA
Lactic Acid, Venous: 0.9 mmol/L (ref 0.5–1.9)
Lactic Acid, Venous: 1 mmol/L (ref 0.5–1.9)

## 2018-03-12 LAB — BASIC METABOLIC PANEL
Anion gap: 11 (ref 5–15)
BUN: 12 mg/dL (ref 6–20)
CALCIUM: 8.4 mg/dL — AB (ref 8.9–10.3)
CO2: 38 mmol/L — ABNORMAL HIGH (ref 22–32)
CREATININE: 0.67 mg/dL (ref 0.44–1.00)
Chloride: 87 mmol/L — ABNORMAL LOW (ref 101–111)
Glucose, Bld: 119 mg/dL — ABNORMAL HIGH (ref 65–99)
Potassium: 4.3 mmol/L (ref 3.5–5.1)
SODIUM: 136 mmol/L (ref 135–145)

## 2018-03-12 LAB — BLOOD GAS, ARTERIAL
ACID-BASE EXCESS: 12.7 mmol/L — AB (ref 0.0–2.0)
BICARBONATE: 39.3 mmol/L — AB (ref 20.0–28.0)
Delivery systems: POSITIVE
Drawn by: 103701
Expiratory PAP: 5
Inspiratory PAP: 12
O2 Content: 4 L/min
O2 SAT: 91 %
PATIENT TEMPERATURE: 100.1
PO2 ART: 62.9 mmHg — AB (ref 83.0–108.0)
pCO2 arterial: 63 mmHg — ABNORMAL HIGH (ref 32.0–48.0)
pH, Arterial: 7.416 (ref 7.350–7.450)

## 2018-03-12 LAB — CBC
HCT: 30.8 % — ABNORMAL LOW (ref 36.0–46.0)
HEMOGLOBIN: 9.5 g/dL — AB (ref 12.0–15.0)
MCH: 30.2 pg (ref 26.0–34.0)
MCHC: 30.8 g/dL (ref 30.0–36.0)
MCV: 97.8 fL (ref 78.0–100.0)
PLATELETS: 287 10*3/uL (ref 150–400)
RBC: 3.15 MIL/uL — ABNORMAL LOW (ref 3.87–5.11)
RDW: 16.8 % — ABNORMAL HIGH (ref 11.5–15.5)
WBC: 19.2 10*3/uL — ABNORMAL HIGH (ref 4.0–10.5)

## 2018-03-12 LAB — MRSA PCR SCREENING: MRSA by PCR: NEGATIVE

## 2018-03-12 LAB — TROPONIN I
TROPONIN I: 0.03 ng/mL — AB (ref <0.03)
TROPONIN I: 0.03 ng/mL — AB (ref <0.03)
Troponin I: 0.03 ng/mL (ref <0.03)

## 2018-03-12 LAB — PROCALCITONIN: Procalcitonin: 0.45 ng/mL

## 2018-03-12 MED ORDER — DILTIAZEM LOAD VIA INFUSION
10.0000 mg | Freq: Once | INTRAVENOUS | Status: AC
  Administered 2018-03-12: 10 mg via INTRAVENOUS
  Filled 2018-03-12: qty 10

## 2018-03-12 MED ORDER — VANCOMYCIN HCL 10 G IV SOLR
1750.0000 mg | INTRAVENOUS | Status: DC
  Administered 2018-03-13: 1750 mg via INTRAVENOUS
  Filled 2018-03-12: qty 1750

## 2018-03-12 MED ORDER — DILTIAZEM HCL 25 MG/5ML IV SOLN
10.0000 mg | Freq: Once | INTRAVENOUS | Status: AC
  Administered 2018-03-12: 10 mg via INTRAVENOUS
  Filled 2018-03-12: qty 5

## 2018-03-12 MED ORDER — DILTIAZEM HCL-DEXTROSE 100-5 MG/100ML-% IV SOLN (PREMIX)
5.0000 mg/h | INTRAVENOUS | Status: DC
  Administered 2018-03-12: 10 mg/h via INTRAVENOUS
  Administered 2018-03-12: 15 mg/h via INTRAVENOUS
  Administered 2018-03-12: 5 mg/h via INTRAVENOUS
  Administered 2018-03-13 – 2018-03-15 (×8): 10 mg/h via INTRAVENOUS
  Administered 2018-03-16: 15 mg/h via INTRAVENOUS
  Filled 2018-03-12 (×12): qty 100

## 2018-03-12 MED ORDER — DIPHENHYDRAMINE HCL 50 MG/ML IJ SOLN
12.5000 mg | Freq: Once | INTRAMUSCULAR | Status: AC
  Administered 2018-03-12: 12.5 mg via INTRAVENOUS
  Filled 2018-03-12: qty 1

## 2018-03-12 MED ORDER — DILTIAZEM LOAD VIA INFUSION
5.0000 mg | Freq: Once | INTRAVENOUS | Status: AC
  Administered 2018-03-12: 5 mg via INTRAVENOUS
  Filled 2018-03-12: qty 5

## 2018-03-12 MED ORDER — SODIUM CHLORIDE 0.9 % IV SOLN
1.0000 g | Freq: Three times a day (TID) | INTRAVENOUS | Status: DC
  Administered 2018-03-12 – 2018-03-16 (×12): 1 g via INTRAVENOUS
  Filled 2018-03-12 (×14): qty 1

## 2018-03-12 MED ORDER — VANCOMYCIN HCL 10 G IV SOLR
2000.0000 mg | Freq: Once | INTRAVENOUS | Status: AC
  Administered 2018-03-12: 2000 mg via INTRAVENOUS
  Filled 2018-03-12: qty 2000

## 2018-03-12 NOTE — Progress Notes (Addendum)
SLP Cancellation Note  Patient Details Name: Alisha Fisher MRN: [id number] DOB: 18-Apr-1946   Cancelled treatment:       Reason Eval/Treat Not Completed: Other (comment)(pt sound asleep, slightly congested breathing quality, will follow = note results of Chest CT.  Given ongoing pulmonary issues - SLP recommends to consider involevment of palliative care again if pt would be amenable.   Macario Golds 03/12/2018, 3:27 PM Luanna Salk, [facility] SLP [phone]

## 2018-03-12 NOTE — Progress Notes (Signed)
Pt was sleeping when I entered the room.  Gave Duoneb, and pt agreed to go on Bipap.  Placed on bipap with 4L O2 bled in.

## 2018-03-12 NOTE — Progress Notes (Signed)
Patient c/o itching and hives. On call NP paged and order obtained for IV benadryl. Will continue to monitor.

## 2018-03-12 NOTE — Progress Notes (Signed)
PROGRESS NOTE  Alisha Fisher KZS:[id number] DOB: [birth year] DOA: 02/26/2018 PCP: [name], MD  HPI/Recap of past 76 hours:  72 year old with past medical history relevant for advanced squamous cell carcinoma of the lung, COPD chronically on 3 L home oxygen, obstructive sleep apnea/obesity hypoventilation syndrome, chronic diastolic heart failure, chronic iron deficiency, hypertension who was admitted with acute hypoxic respiratory failure and sepsis and found to have pulmonary edema, acute COPD exacerbation.  03/11/2018: Patient seen and examined at her bedside.  She reports persistent productive cough.  Later this morning RN reports complaints of mild chest tightness.  Chest x-ray repeated, personally reviewed and revealed worsening infiltrates on the right lower mid and upper lungs as well as a right pleural effusion.  CT angio aorta ordered to further evaluate thoracic aorta dissection as well as right thoracentesis.   03/12/2018: Patient seen and examined at bedside.  She has no new complaints.  She wore her CPAP this morning.  Unable to obtain right thoracentesis yesterday due to not enough fluid in the pleural space.    Assessment/Plan: Principal Problem:   Acute on chronic respiratory failure with hypercapnia (HCC) Active Problems:   Obesity   COPD (chronic obstructive pulmonary disease) (HCC)   Essential hypertension   Squamous cell lung cancer, right (HCC)   Chronic diastolic heart failure (HCC)   OSA (obstructive sleep apnea)   Iron deficiency anemia   Anxiety disorder   Goals of care, counseling/discussion   COPD with acute exacerbation (HCC)   Pleural effusion   Community acquired pneumonia   Community acquired pneumonia of right lower lobe of lung ([place])   AKI (acute kidney injury) ([place])   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)   CHF (congestive heart failure) (HCC)   Atrial fibrillation with RVR (HCC)  Acute on chronic hypoxic hypercarbic respiratory failure most  likely secondary to lung cancer, pulmonary edema Continue O2 supplementation to maintain O2 saturation 92% On oral Lasix 40 mg daily Monitor strict I's and O's Continue Dulera Continue Anoro Ellipta Continue Xopenex Continue BiPAP at night  Sepsis secondary to unclear source Suspect aspiration Patient refuses modified barium test T-max 100.6 WBC 19 K Start IV Zosyn empirically Continue to monitor fever curve Repeat CBC in the morning  Right lower lobe squamous cell carcinoma Patient is on outpatient chemotherapy Status post right IJ Port cath placement on 03/05/2018  Thoracic aortic dissection CTA revealed rapid increase of aneurysm from 4.02 4.7 cm with high suspicion for dissection Repeat CTA chest aorta today Strict blood pressure control  OSA/obesity hypoventilation syndrome Overnight BiPAP  Chronic A. fib with RVR On metoprolol 620 mg daily On aspirin 324 mg daily Last echo done on 02/28/2018 revealed normal EF with no wall motion abnormality  Goals of care Poor prognosis Palliative care following DNR     Code Status: DNR  Family Communication: Son at bedside  Disposition Plan: Home/SNF when clinically stable   Consultants:  Palliative care  Oncology  PCCM  Interventional radiology  Procedures:  right IJ Port-A-Cath placement  Antimicrobials:  Completed IV antibiotics IV ceftriaxone and IV azithromycin  DVT prophylaxis: SCDs Not on chemical DVT prophylaxis due to known aortic dissection   Objective: Vitals:   03/12/18 0845 03/12/18 1038 03/12/18 1300 03/12/18 1437  BP: (!) 148/60 (!) 143/70  (!) 114/55  Pulse: 88 (!) 116  69  Resp:  18  18  Temp: (!) 100.6 F (38.1 C) 99.4 F (37.4 C)  97.8 F (36.6 C)  TempSrc: Oral  Oral  Oral  SpO2:  95% 96% 93%  Weight:      Height:        Intake/Output Summary (Last 24 hours) at 03/12/2018 1449 Last data filed at 03/12/2018 3817 Gross per 24 hour  Intake 227.79 ml  Output 900 ml  Net  -672.21 ml   Filed Weights   02/22/2018 1437 03/07/18 1500 03/09/18 1050  Weight: 104.3 kg (230 lb) 112.3 kg (247 lb 9.2 oz) 113.5 kg (250 lb 3.6 oz)    Exam:  . General: 72 y.o. year-old female well-developed well-nourished in no acute distress.  Alert oriented x3.   . Cardiovascular: Regular rate and rhythm with no rubs or gallops.  No thyromegaly or JVD noted. Marland Kitchen Respiratory: Diffuse rales bilaterally with no wheezes noted.  Poor inspiratory effort.   . Abdomen: Soft nontender nondistended with normal bowel sounds x4 quadrants. . Musculoskeletal: No lower extremity edema.  Psychiatry: Mood is irritable.   Data Reviewed: CBC: Recent Labs  Lab 03/06/18 0328 03/07/18 0327 03/09/18 0500 03/10/18 0417 03/11/18 0456 03/12/18 0610  WBC 18.8* 16.4* 11.0* 9.3 10.9* 19.2*  NEUTROABS 17.3*  --   --   --   --   --   HGB 9.8* 10.3* 9.2* 8.7* 9.0* 9.5*  HCT 32.0* 32.9* 30.0* 29.2* 29.5* 30.8*  MCV 95.0 95.1 95.8 97.7 98.7 97.8  PLT 248 225 275 255 260 711   Basic Metabolic Panel: Recent Labs  Lab 03/07/18 0327 03/09/18 0500 03/10/18 0417 03/11/18 0456 03/12/18 0610  NA 135 135 135 136 136  K 3.5 3.8 3.9 4.0 4.3  CL 91* 88* 87* 86* 87*  CO2 37* 41* 41* 40* 38*  GLUCOSE 101* 136* 90 110* 119*  BUN 17 13 12 11 12   CREATININE 0.59 0.62 0.61 0.54 0.67  CALCIUM 8.2* 8.1* 7.8* 8.1* 8.4*  MG  --  1.6* 1.5* 1.9  --    GFR: Estimated Creatinine Clearance: 85.1 mL/min (by C-G formula based on SCr of 0.67 mg/dL). Liver Function Tests: Recent Labs  Lab 03/07/18 0327 03/11/18 0456  AST 15 13*  ALT 18 14  ALKPHOS 49 67  BILITOT 0.9 0.6  PROT 5.5* 5.8*  ALBUMIN 2.5* 2.5*   No results for input(s): LIPASE, AMYLASE in the last 168 hours. No results for input(s): AMMONIA in the last 168 hours. Coagulation Profile: No results for input(s): INR, PROTIME in the last 168 hours. Cardiac Enzymes: Recent Labs  Lab 03/07/18 0957 03/07/18 1630 03/07/18 2136 03/12/18 0610  03/12/18 1251  TROPONINI 0.03* 0.04* 0.05* 0.03* 0.03*   BNP (last 3 results) No results for input(s): PROBNP in the last 8760 hours. HbA1C: No results for input(s): HGBA1C in the last 72 hours. CBG: No results for input(s): GLUCAP in the last 168 hours. Lipid Profile: No results for input(s): CHOL, HDL, LDLCALC, TRIG, CHOLHDL, LDLDIRECT in the last 72 hours. Thyroid Function Tests: No results for input(s): TSH, T4TOTAL, FREET4, T3FREE, THYROIDAB in the last 72 hours. Anemia Panel: Recent Labs    03/10/18 0915  FERRITIN 1,176*  TIBC 193*  IRON 24*   Urine analysis:    Component Value Date/Time   COLORURINE YELLOW 02/26/2018 1627   APPEARANCEUR CLEAR 02/26/2018 1627   LABSPEC 1.018 02/26/2018 1627   PHURINE 5.0 02/26/2018 1627   GLUCOSEU NEGATIVE 02/26/2018 1627   HGBUR NEGATIVE 02/26/2018 1627   BILIRUBINUR NEGATIVE 02/26/2018 [place] 02/26/2018 1627   PROTEINUR NEGATIVE 02/26/2018 1627   NITRITE NEGATIVE 02/26/2018 1627  LEUKOCYTESUR NEGATIVE 02/26/2018 1627   Sepsis Labs: @LABRCNTIP (procalcitonin:4,lacticidven:4)  )No results found for this or any previous visit (from the past 240 hour(s)).    Studies: Ct Angio Chest Aorta W/cm &/or Wo/cm  Result Date: 03/12/2018 CLINICAL DATA:  History of squamous carcinoma of the right lower lung, type A aortic dissection, increased shortness of breath, cough and left chest pain. EXAM: CT ANGIOGRAPHY CHEST WITH CONTRAST TECHNIQUE: Multidetector CT imaging of the chest was performed using the standard protocol during bolus administration of intravenous contrast. Multiplanar CT image reconstructions and MIPs were obtained to evaluate the vascular anatomy. 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 This begins just above the aortic  root and can be followed into the aortic arch. Due to suboptimal opacification of the descending thoracic aorta, the distal extent of dissection cannot be adequately determined. There is again associated aneurysmal dilatation of the ascending thoracic aorta which measures 5.1 cm in maximal diameter. When remeasured on the prior study, the aorta likely measured as much as 5 cm on the prior study. There is no evidence of mediastinal hemorrhage or findings to suggest overt aortic rupture. The study was targeted for timing of pulmonary arterial opacification. There is no evidence of pulmonary embolism. The heart is moderately enlarged. Some degree of elevated right heart pressures and right heart failure suspected based on reflux of contrast into the intrahepatic IVC and hepatic veins. Mediastinum/Nodes: No significant change in mediastinal lymphadenopathy. Index lower right paratracheal lymph node measures approximately 2 cm. Lungs/Pleura: No change in extensive airspace consolidation and mass of the right lower lung extending to the pleura. Associated complete airway obstruction of the right lower lobe and right middle lobe. The right upper lobe shows progressive volume loss and increased apical parenchymal opacity which may be consistent with a component of acute pneumonia. The progressive volume loss is associated with attenuation of right upper lobe bronchi and there may be a component of progressive malignancy causing further airway obstruction of the right upper lobe and now postobstructive pneumonitis of the right upper lobe. The left lung shows stable consolidative opacity in the anterior left upper lobe. There is a stable small left pleural effusion. Upper Abdomen: No acute abnormality. Musculoskeletal: No chest wall abnormality. No acute or significant osseous findings. Review of the MIP images confirms the above findings. IMPRESSION: 1. 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 No evidence of mediastinal hemorrhage. Distal extent of the aortic dissection is not well delineated due to suboptimal opacification of the aorta. 2. No evidence of acute pulmonary embolism. 3. Evidence of right heart failure. 4. No change in extensive tumor of the right lower lobe and middle lobe with complete airway obstruction. 5. Some progressive opacity in the right upper lobe is suspicious for acute pneumonia. Right upper lobe bronchi appear more attenuated and further extension of tumor causing postobstructive pneumonitis may also be a cause of increased right upper lobe airspace disease. 6. Stable consolidative changes in the anterior left upper lobe and small left pleural effusion. Electronically Signed   By: Aletta Edouard M.D.   On: 03/12/2018 08:48    Scheduled Meds: . amLODipine  5 mg Oral Daily  . aspirin  324 mg Oral Daily  . dextromethorphan-guaiFENesin  1 tablet Oral BID  . furosemide  40 mg Oral  Daily  . levalbuterol  0.63 mg Nebulization TID  . metoprolol succinate  200 mg Oral Daily  . mometasone-formoterol  2 puff Inhalation BID  . pantoprazole  40 mg Oral BID  . protein supplement shake  11 oz Oral Q24H  . sodium chloride flush  10-40 mL Intracatheter Q12H  . sodium chloride flush  10-40 mL Intracatheter Q12H  . umeclidinium-vilanterol  1 puff Inhalation Daily    Continuous Infusions: . ceFEPime (MAXIPIME) IV Stopped (03/12/18 0930)  . diltiazem (CARDIZEM) infusion 15 mg/hr (03/12/18 0845)  . [START ON 03/13/2018] vancomycin       LOS: 16 days     Kayleen Memos, MD [facility] Pager [phone]  If 7PM-7AM, please contact night-coverage [email or web address] Password [facility] 03/12/2018, 2:49 PM

## 2018-03-12 NOTE — Progress Notes (Signed)
CSW following to assist with SNF placement and discharge planning.   CSW attempted to speak with patient at bedside regarding SNF bed offers. Patient on bipap, CSW introduced self and role. CSW asked patient if she wanted CSW to contact her sister [name] to discuss SNF bed offers, patient shook her head yes.   Patient was originally assessed by CSW on 5/29 accompanied by her sister [name] [phone]) and patient granted CSW verbal permission to discuss discharge planning with her sister [name]).  CSW contacted patient's sister [name] [phone]) and provided bed offers. Patient's sister requested that CSW text offers because she was unable to write them down. Patient's sister agreed to follow up with CSW after reviewing bed offers. CSW sent bed offers via text message.   CSW will continue to follow and assist with discharge planning.  Abundio Miu, [place] Social Worker [facility] Cell#: [phone]

## 2018-03-12 NOTE — Progress Notes (Signed)
Rapid Response Event Note  Overview:  Rapid called to room 1429 for patient HR sustaining 125-135 bpm with nonsustained spikes in HR up to 155 bpm.    Initial Focused Assessment: Pt resting in bed, on CPAP. Pt appears comfortable, intermittent snore breathing noted. Oxygen saturation 95%. Pt feels warm to touch, axillary temperature 100.6F. Pt HR irregular and radial pulse is +3 and irregular. Pt heart rhythm appears to be Afib. Currently on continuous Cardizem gtt. When asked how she feels, patient responds "blah", pt denies chest pain at this time.   Interventions: Cardizem gtt rate increased to 12.5mg /hr. NP notified of HR sustaining at 125-135 bpm, with nonsustained spikes in HR up to 155 bpm.  Plan of Care (if not transferred): RN to follow titration orders on the Cardizem gtt, until maximum gtt rate is achieved. If HR continues to sustain 125-135 bpm and/or patient continues to have frequent spikes in HR, RN to notify NP for further orders.  Event Summary:  Alisha Fisher

## 2018-03-12 NOTE — Progress Notes (Signed)
Patient was sustaining HR 150-160's in A fib. EKG obtained, VSS otherwise. Patient complains of chest pressure, shortness of breath, diaphoretic, and flushed. On call NP paged and order obtained for Cardizem 5 mg IV push. Medication administered, HR sustaining 140-150's. On call NP notified and order obtained to initiate Cardizem gtt. Rapid response called after Cardizem gtt was titrated up to 10 mg/hr and sustaining HR 130-140 with spikes in the 150's. Rapid response team assessed and intervened as needed. NP paged and order obtained for Cardizem bolus of 10 mg. Will continue to monitor patient closely.

## 2018-03-12 NOTE — Progress Notes (Signed)
CRITICAL VALUE ALERT  Critical Value:  Troponin 0.03  Date & Time Notied:  03/12/2018 0705  Provider Notified: Aileen Fass, verbally  Orders Received/Actions taken: no new orders received. Critical value passed on to oncoming RN.

## 2018-03-12 NOTE — Progress Notes (Signed)
Pharmacy Antibiotic Note  Alisha Fisher is a 72 y.o. female admitted on 02/15/2018 with history relevant for advanced squamous cell carcinoma of the lung, COPD chronically on 3 L home oxygen, obstructive sleep apnea/obesity hypoventilation syndrome, chronic diastolic heart failure, chronic iron deficiency, hypertension who was admitted with acute hypoxic respiratory failure and sepsis and found to have pulmonary edema, acute COPD exacerbation .  Pharmacy has been consulted for vanc and cefepime dosing.  Plan: Vancomycin 2gm Iv x 1 then 1750mg  q24h (AUC 484.4, Scr 0.67) Cefepime 1gm IV q8h Follow renal function and clinical course  Height: 5\' 7"  (170.2 cm) Weight: 250 lb 3.6 oz (113.5 kg) IBW/kg (Calculated) : 61.6  Temp (24hrs), Avg:99.2 F (37.3 C), Min:98.6 F (37 C), Max:100.1 F (37.8 C)  Recent Labs  Lab 03/07/18 0327 03/09/18 0500 03/10/18 0417 03/11/18 0456 03/12/18 0610  WBC 16.4* 11.0* 9.3 10.9* 19.2*  CREATININE 0.59 0.62 0.61 0.54 0.67    Estimated Creatinine Clearance: 85.1 mL/min (by C-G formula based on SCr of 0.67 mg/dL).    Allergies  Allergen Reactions  . Ciprofloxacin Rash and Other (See Comments)    Rash possibly caused by Cipro?  . Flagyl [Metronidazole] Rash and Other (See Comments)    Rash possibly caused by Flagyl?  Marland Kitchen Zosyn [Piperacillin Sod-Tazobactam So] Itching, Rash and Other (See Comments)    Has patient had a PCN reaction causing immediate rash, facial/tongue/throat swelling, SOB or lightheadedness with hypotension: No Has patient had a PCN reaction causing severe rash involving mucus membranes or skin necrosis: No Has patient had a PCN reaction that required hospitalization: No Has patient had a PCN reaction occurring within the last 10 years: Yes If all of the above answers are "NO", then may proceed with Cephalosporin use.      Thank you for allowing pharmacy to be a part of this patient's care.  Dolly Rias RPh 03/12/2018, 7:45  AM Pager [phone]

## 2018-03-13 ENCOUNTER — Inpatient Hospital Stay (HOSPITAL_COMMUNITY): Payer: Medicare Other

## 2018-03-13 DIAGNOSIS — J9602 Acute respiratory failure with hypercapnia: Secondary | ICD-10-CM

## 2018-03-13 DIAGNOSIS — M7989 Other specified soft tissue disorders: Secondary | ICD-10-CM

## 2018-03-13 LAB — CBC
HEMATOCRIT: 28.5 % — AB (ref 36.0–46.0)
Hemoglobin: 8.6 g/dL — ABNORMAL LOW (ref 12.0–15.0)
MCH: 29.9 pg (ref 26.0–34.0)
MCHC: 30.2 g/dL (ref 30.0–36.0)
MCV: 99 fL (ref 78.0–100.0)
Platelets: 271 10*3/uL (ref 150–400)
RBC: 2.88 MIL/uL — AB (ref 3.87–5.11)
RDW: 17.3 % — ABNORMAL HIGH (ref 11.5–15.5)
WBC: 17.1 10*3/uL — AB (ref 4.0–10.5)

## 2018-03-13 LAB — PROCALCITONIN: Procalcitonin: 0.55 ng/mL

## 2018-03-13 LAB — LACTIC ACID, PLASMA
LACTIC ACID, VENOUS: 1.9 mmol/L (ref 0.5–1.9)
LACTIC ACID, VENOUS: 1.5 mmol/L (ref 0.5–1.9)

## 2018-03-13 MED ORDER — LEVALBUTEROL HCL 0.63 MG/3ML IN NEBU
0.6300 mg | INHALATION_SOLUTION | Freq: Three times a day (TID) | RESPIRATORY_TRACT | Status: DC
  Administered 2018-03-13 – 2018-03-15 (×6): 0.63 mg via RESPIRATORY_TRACT
  Filled 2018-03-13 (×8): qty 3

## 2018-03-13 MED ORDER — MORPHINE INJECTION FOR INHALATION 10 MG/ML
10.0000 mg | RESPIRATORY_TRACT | Status: DC | PRN
Start: 2018-03-13 — End: 2018-03-14
  Administered 2018-03-14: 10 mg via RESPIRATORY_TRACT
  Filled 2018-03-13 (×2): qty 1

## 2018-03-13 MED ORDER — METHYLPREDNISOLONE SODIUM SUCC 125 MG IJ SOLR
80.0000 mg | Freq: Four times a day (QID) | INTRAMUSCULAR | Status: DC
  Administered 2018-03-13 – 2018-03-16 (×11): 80 mg via INTRAVENOUS
  Filled 2018-03-13 (×11): qty 2

## 2018-03-13 MED ORDER — BENZONATATE 100 MG PO CAPS: 200.0000 mg | ORAL_CAPSULE | Freq: Three times a day (TID) | ORAL | Status: DC | PRN

## 2018-03-13 MED ORDER — CLONAZEPAM 1 MG PO TABS
1.0000 mg | ORAL_TABLET | Freq: Three times a day (TID) | ORAL | Status: DC | PRN
  Administered 2018-03-13 – 2018-03-15 (×4): 1 mg via ORAL
  Filled 2018-03-13 (×5): qty 1

## 2018-03-13 NOTE — Progress Notes (Signed)
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  03/11/2018: Patient seen and examined at her bedside.  She reports persistent productive cough.  Later this morning RN reports complaints of mild chest tightness.  Chest x-ray repeated, personally reviewed and revealed worsening infiltrates on the right lower mid and upper lungs as well as a right pleural effusion.  CT angio aorta ordered to further evaluate thoracic aorta dissection as well as right thoracentesis.   03/12/2018: Patient seen and examined at bedside.  She has no new complaints.  She wore her CPAP this morning.  Unable to obtain right thoracentesis yesterday due to not enough fluid in the pleural space.  03/13/18: Seen and examined at bedside. Persistent dyspnea at rest. Poor prognosis. Contacted oncology and palliative care team for goals of care.    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 Highly appreciate recommendations.  Sepsis secondary to unclear source Suspect aspiration C/w IV cefepime States zosyn gives her a rash Continue to monitor fever curve Repeat CBC in the morning  Right lower lobe squamous cell carcinoma worsening with possible obstruction of right lung Might benefit from radiation therapy per oncology.  RUE edema/tenderness Duplex US negative for DVT  Thoracic aortic dissection CTA revealed rapid increase of aneurysm from 4.02 70.0 cm with high suspicion for dissection Repeat CTA chest aorta today Strict blood pressure control  OSA/obesity hypoventilation syndrome Overnight BiPAP  Chronic A. fib with RVR On metoprolol 70 mg daily On aspirin 324 mg daily Last echo done on 02/28/2018 revealed normal EF with no wall motion abnormality  Goals of care Poor prognosis Palliative care consulted DNR  Medical non compliance Needs reinforcement Intermittently declines BIPAP    Code Status: DNR  Family Communication: None at bedside  Disposition Plan: Home/SNF when clinically stable   Consultants:  Palliative care  Oncology  PCCM  Interventional radiology  Procedures:  right IJ Port-A-Cath placement  Antimicrobials:  Completed IV antibiotics IV ceftriaxone and IV azithromycin  DVT prophylaxis: SCDs Not on chemical DVT prophylaxis due to known aortic dissection   Objective: Vitals:   03/13/18 1359 03/13/18 1957  03/13/18 2010 03/13/18 2011  BP:  107/64    Pulse:  79    Resp:   (!) 24    Temp:  98.1 F (36.7 C)    TempSrc:  Oral    SpO2: 91% 100% 97% 97%  Weight:      Height:        Intake/Output Summary (Last 24 hours) at 03/13/2018 2150 Last data filed at 03/13/2018 1400 Gross per 24 hour  Intake 660 ml  Output -  Net 660 ml   Filed Weights   01/31/2018 1437 03/07/18 1500 03/09/18 1050  Weight: 104.3 kg (230 lb) 112.3 kg (247 lb 9.2 oz) 113.5 kg (250 lb 3.6 oz)    Exam:  . General: 72 y.o. year-old female WD WN NAD A&O x3.   . Cardiovascular: RRR no rubs or gallops. No JVD or thyromegaly. Marland Kitchen Respiratory: Diffuse rales bilaterally with no wheezes noted.  Poor inspiratory effort.   . Abdomen: Soft nontender nondistended with normal bowel sounds x4 quadrants. . Musculoskeletal: No lower extremity edema.  Psychiatry: Mood is irritable.   Data Reviewed: CBC: Recent Labs  Lab 03/09/18 0500 03/10/18 0417 03/11/18 0456 03/12/18 0610 03/13/18 0848  WBC 11.0* 9.3 10.9* 19.2* 17.1*  HGB 9.2* 8.7* 9.0* 9.5* 8.6*  HCT 30.0* 29.2* 29.5* 30.8* 28.5*  MCV 95.8 97.7 98.7 97.8 99.0  PLT 275 255 260 287 563   Basic Metabolic Panel: Recent Labs  Lab 03/07/18 0327 03/09/18 0500 03/10/18 0417 03/11/18 0456 03/12/18 0610  NA 135 135 135 136 136  K 3.5 3.8 3.9 4.0 4.3  CL 91* 88* 87* 86* 87*  CO2 37* 41* 41* 40* 38*  GLUCOSE 101* 136* 90 110* 119*  BUN 17 13 12 11 12   CREATININE 0.59 0.62 0.61 0.54 0.67  CALCIUM 8.2* 8.1* 7.8* 8.1* 8.4*  MG  --  1.6* 1.5* 1.9  --    GFR: Estimated Creatinine Clearance: 85.1 mL/min (by C-G formula based on SCr of 0.67 mg/dL). Liver Function Tests: Recent Labs  Lab 03/07/18 0327 03/11/18 0456  AST 15 13*  ALT 18 14  ALKPHOS 49 67  BILITOT 0.9 0.6  PROT 5.5* 5.8*  ALBUMIN 2.5* 2.5*   No results for input(s): LIPASE, AMYLASE in the last 168 hours. No results for input(s): AMMONIA in the last 168 hours. Coagulation Profile: No results for input(s): INR, PROTIME in the last 168 hours. Cardiac Enzymes: Recent  Labs  Lab 03/07/18 1630 03/07/18 2136 03/12/18 0610 03/12/18 1251 03/12/18 1755  TROPONINI 0.04* 0.05* 0.03* 0.03* 0.03*   BNP (last 3 results) No results for input(s): PROBNP in the last 8760 hours. HbA1C: No results for input(s): HGBA1C in the last 72 hours. CBG: No results for input(s): GLUCAP in the last 168 hours. Lipid Profile: No results for input(s): CHOL, HDL, LDLCALC, TRIG, CHOLHDL, LDLDIRECT in the last 72 hours. Thyroid Function Tests: No results for input(s): TSH, T4TOTAL, FREET4, T3FREE, THYROIDAB in the last 72 hours. Anemia Panel: No results for input(s): VITAMINB12, FOLATE, FERRITIN, TIBC, IRON, RETICCTPCT in the last 72 hours. Urine analysis:    Component Value Date/Time   COLORURINE YELLOW 02/26/2018 1627   APPEARANCEUR CLEAR 02/26/2018 1627   LABSPEC 1.018 02/26/2018 1627   PHURINE 5.0 02/26/2018 1627   GLUCOSEU NEGATIVE 02/26/2018 1627   HGBUR NEGATIVE 02/26/2018 1627   BILIRUBINUR NEGATIVE 02/26/2018 [place] 02/26/2018 1627   PROTEINUR NEGATIVE 02/26/2018 1627   NITRITE NEGATIVE 02/26/2018 1627   LEUKOCYTESUR NEGATIVE 02/26/2018 1627  Sepsis Labs: @LABRCNTIP (procalcitonin:4,lacticidven:4)  ) Recent Results (from the past 240 hour(s))  Culture, blood (routine x 2)     Status: None (Preliminary result)   Collection Time: 03/12/18  7:50 AM  Result Value Ref Range Status   Specimen Description   Final    BLOOD LEFT HAND Performed at [facility] [address] [place], [place] [address]    Special Requests   Final    BOTTLES DRAWN AEROBIC ONLY Blood Culture results may not be optimal due to an inadequate volume of blood received in culture bottles Performed at [facility] [address] [place], [place] [address]    Culture   Final    NO GROWTH 1 DAY Performed at [facility], [address] [place], [place] [address]    Report Status PENDING  Incomplete  Culture, blood  (routine x 2)     Status: None (Preliminary result)   Collection Time: 03/12/18  7:50 AM  Result Value Ref Range Status   Specimen Description   Final    BLOOD RIGHT HAND Performed at [facility] [address] [place], [place] [address]    Special Requests   Final    BOTTLES DRAWN AEROBIC ONLY Blood Culture results may not be optimal due to an inadequate volume of blood received in culture bottles Performed at [facility] [address], [place], [place] [address]    Culture   Final    NO GROWTH 1 DAY Performed at [facility], [place] [address] [place], [place] [address]    Report Status PENDING  Incomplete  MRSA PCR Screening     Status: None   Collection Time: 03/12/18  6:21 PM  Result Value Ref Range Status   MRSA by PCR NEGATIVE NEGATIVE Final    Comment:        The GeneXpert MRSA Assay (FDA approved for NASAL specimens only), is one component of a comprehensive MRSA colonization surveillance program. It is not intended to diagnose MRSA infection nor to guide or monitor treatment for MRSA infections. Performed at [facility], [place] [address] [place], [place] [address]       Studies: No results found.  Scheduled Meds: . amLODipine  5 mg Oral Daily  . aspirin  324 mg Oral Daily  . dextromethorphan-guaiFENesin  1 tablet Oral BID  . furosemide  40 mg Oral Daily  . levalbuterol  0.63 mg Nebulization TID  . methylPREDNISolone (SOLU-MEDROL) injection  80 mg Intravenous Q6H  . metoprolol succinate  200 mg Oral Daily  . mometasone-formoterol  2 puff Inhalation BID  . pantoprazole  40 mg Oral BID  . protein supplement shake  11 oz Oral Q24H  . sodium chloride flush  10-40 mL Intracatheter Q12H  . sodium chloride flush  10-40 mL Intracatheter Q12H  . umeclidinium-vilanterol  1 puff Inhalation Daily    Continuous Infusions: . ceFEPime (MAXIPIME) IV Stopped (03/13/18 1742)  . diltiazem (CARDIZEM)  infusion 10 mg/hr (03/13/18 2003)     LOS: 17 days     Kayleen Memos, MD [facility] Pager [phone]  If 7PM-7AM, please contact night-coverage [email or web address] Password [email or web address] 03/13/2018, 9:50 PM

## 2018-03-13 NOTE — Progress Notes (Signed)
Nutrition Follow-up  DOCUMENTATION CODES:   Obesity unspecified  INTERVENTION:   Provide Premier Protein once daily, each supplement provides 160kcal and 30g protein.   NUTRITION DIAGNOSIS:   Increased nutrient needs related to chronic illness, catabolic illness, cancer and cancer related treatments as evidenced by estimated needs.  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   Pt reports she had issues with swallowing on 6/10 and her appetite has remained poor since. SLP saw pt the same day. 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 Pt was placed on DYS 3 mechanical soft diet. She has not tried much since. She tolerated two bran muffins this morning. Denied increased coughing or difficulty swallowing with those. Pt drinks Premier Protein on/off but would like to continue with them.   A recent wt has not been obtained since last RD visit.   Medications reviewed and include: 40 mg lasix Labs reviewed: Cl 87 (L)   Diet Order:   Diet Order           DIET DYS 3 Room service appropriate? Yes with Assist; Fluid consistency: Thin  Diet effective now          EDUCATION NEEDS:   No education needs have been identified at this time  Skin:  Skin Assessment: Reviewed RN Assessment  Last BM:  03/12/18  Height:   Ht Readings from Last 1 Encounters:  03/09/18 5\' 7"  (1.702 m)    Weight:   Wt Readings from Last 1 Encounters:  03/09/18  250 lb 3.6 oz (113.5 kg)    Ideal Body Weight:  61.36 kg  BMI:  Body mass index is 39.19 kg/m.  Estimated Nutritional Needs:   Kcal:  2090-2295 (20-22 kcal/kg)  Protein:  105-125 grams  Fluid:  >/= 2 L/day   Mariana Single RD, LDN Clinical Nutrition Pager # - [phone]

## 2018-03-13 NOTE — Progress Notes (Signed)
Pt changed her mind and is trying to wear BiPAP qhs.

## 2018-03-13 NOTE — Progress Notes (Signed)
Pt refusing to wear BiPAP qhs.  Pt not in distress and nasal cannula remains in place.  Pt encouraged to have RN contact RT should she change her mind.

## 2018-03-13 NOTE — Progress Notes (Signed)
Pt. not wanting CPAP on at this time, RT to monitor.

## 2018-03-13 NOTE — Progress Notes (Signed)
  Speech Language Pathology Treatment: Dysphagia  Patient Details Name: Alisha Fisher MRN: [id number] DOB: 1946/06/11 Today's Date: 03/13/2018 Time: 3888-7579 SLP Time Calculation (min) (ACUTE ONLY): 17 min  Assessment / Plan / Recommendation Clinical Impression  Pt today reports better tolerance of po intake since she has been "informed how to eat".  SLP and NT slid pt up in bed and SLP observed her consuming muffin and coffee/water.  Pt with good initial tolerance of minimal po intake observed her to consume (muffin, water, coffee).  No indication of oropharyngeal dysphagia.  Suspect esophageal dysmotility as largest source of aspiration risk.  Pt was heard to cough AFTER meal with expectoration of frothy secretions.    Reviewed esophageal precautions with pt to decrease aspiration risk.  Pt has been thoroughly educated and has known small hiatal hernia/dysmotility.    Will sign off as all education completed.   Please reorder if pt desires instrumental evaluation to assess oropharyngeal swallow *MBS* to rule out oropharyngeal deficits.  Thanks.       HPI HPI: pt is a 72 yo female adm to [facility] with respiratory difficulties.  PMH + for lung cancer s/p XRT, COPD, panic attacks, dysphagia.  Pt with no effusion  - ?pna and squamous cell carcinoma of right lung.  Pt reports problems swallowing since her radiation tx -  intermittently-  She states she will choke on saliva and regurgitate non-digested food.  She also states she will choke on liquids.  Pt underwent an esophageal evaluation - barium swallow that showed poor esophageal motility, slight mass effect on mid esophagus (did not cause significant narrowing of esophagus) due to adenocarcinoma, small hiatal hernia without stricture.  Follow up SLP to assess tolerance of diet and help mitigate dysphagia.  Per RN yesterday, pt became choked on roast beef.    Pt today found to be coughing overtly after intake with HOB lowered. SlP was reordered du to  aspiration concerns.        SLP Plan  All goals met       Recommendations  Diet recommendations: Thin liquid;Other(comment)(regular/encourage softer foods) Liquids provided via: Straw Medication Administration: Other (Comment)(as tolerated) Supervision: Patient able to self feed Compensations: Minimize environmental distractions;Slow rate;Small sips/bites;Other (Comment)(drink liquids t/o meal, small frequent meals) Postural Changes and/or Swallow Maneuvers: Seated upright 90 degrees;Out of bed for meals;Upright 30-60 min after meal                Oral Care Recommendations: Oral care BID Follow up Recommendations: None SLP Visit Diagnosis: Dysphagia, unspecified (R13.10) Plan: All goals met       GO                Macario Golds 03/13/2018, 10:04 AM  Luanna Salk, [facility] SLP [phone]

## 2018-03-13 NOTE — Progress Notes (Signed)
Pt. not wanting to wear BiPAP at this time, in no apparent distress, n/c remains on/in place, RN made aware and pt. To notify if wanting to wear.

## 2018-03-13 NOTE — Progress Notes (Signed)
RUE venous duplex prelim: negative for DVT. Landry Mellow, RDMS, RVT

## 2018-03-13 NOTE — Progress Notes (Signed)
Unfortunately, it looks like we are having some issues now with Alisha Fisher.  Her breathing is worsening.  She had a CT angiogram done.  Unfortunate, it looks like she has obstruction of the right lung.  This is in all likelihood from malignancy.  I had a long talk with Alisha Fisher this evening.  I did speak to her sister on the phone.  Our options in this situation are extremely limited.  I do not believe that immunotherapy is going to be effective.  I do not believe that she is strong enough to take systemic chemotherapy.  May be, if we can potentially open up the right lung, she will definitely be better.  I think the only way that we could do this is radiation therapy.  I know that she has previously received radiation therapy.  I do not know if she is a candidate for any further therapy.  I will speak with radiation oncology in the morning.  Her hemoglobin is down to 8.6.  This is not helping.  I suspect that we may have to consider transfusing her tomorrow.  She needs to have better oxygen delivery.  A higher hematocrit is the only way to do this.  I will see about getting her back on some steroids.  We will see if this can help as an anti-inflammatory.  I will also try her on some morphine nebulizers.  Morphine is a very good bronchodilator.  I do not believe that she is a candidate for bronchoscopy.  I think that if she had a bronchoscopy, she would end up on a ventilator and she would never come off of ventilator because of her underlying lung function.  I know this is a very tough situation for Alisha Fisher.  I know that she is a DNR but she would like to have better quality of life.  I understand this.  We will try to improve her quality of life if we can.  I think it is apparent that her malignancy is progressing.  I told her sister that the cancer does not have to grow that much to be able to cause obstruction of her airways.  I know that everybody is working real hard to try to improve  Alisha Fisher is breathing so that she will be able to go home.  Of note, she had a Doppler of her right arm.  This was negative for any obvious thromboembolic disease.  I have taken a pre-albumin on her.  I think this will be very important as to her prognosis.  It is clear that we might be looking at hospice.  I know this would be a difficult choice for Alisha Fisher.  I spent about 40-45 minutes with her this evening.  I know her well.  I just feel so bad for her that we would not be able to help her as much as I thought we could.  I just do not think that immunotherapy is going to help [place] at this point.  Again she is not a candidate for systemic chemotherapy.  May be, radiation therapy could be a consideration that is focused on the right lung/mainstem bronchus.  Her sister understands what is going on.  Again, her sister would also like to see as try to help her quality of life.  Lattie Haw, MD [place] 37:5

## 2018-03-14 ENCOUNTER — Ambulatory Visit
Admit: 2018-03-14 | Discharge: 2018-03-14 | Disposition: A | Discharge status: Home / Self Care | Payer: Medicare Other | Attending: Radiation Oncology | Admitting: Radiation Oncology

## 2018-03-14 DIAGNOSIS — C3431 Malignant neoplasm of lower lobe, right bronchus or lung: Secondary | ICD-10-CM

## 2018-03-14 DIAGNOSIS — C3491 Malignant neoplasm of unspecified part of right bronchus or lung: Secondary | ICD-10-CM

## 2018-03-14 LAB — CBC WITH DIFFERENTIAL/PLATELET
Basophils Absolute: 0 10*3/uL (ref 0.0–0.1)
Basophils Relative: 0 %
EOS ABS: 0 10*3/uL (ref 0.0–0.7)
Eosinophils Relative: 0 %
HCT: 27.8 % — ABNORMAL LOW (ref 36.0–46.0)
HEMOGLOBIN: 8.5 g/dL — AB (ref 12.0–15.0)
LYMPHS ABS: 0.3 10*3/uL — AB (ref 0.7–4.0)
LYMPHS PCT: 2 %
MCH: 29.9 pg (ref 26.0–34.0)
MCHC: 30.6 g/dL (ref 30.0–36.0)
MCV: 97.9 fL (ref 78.0–100.0)
Monocytes Absolute: 0 10*3/uL — ABNORMAL LOW (ref 0.1–1.0)
Monocytes Relative: 0 %
NEUTROS PCT: 98 %
Neutro Abs: 13.4 10*3/uL — ABNORMAL HIGH (ref 1.7–7.7)
Platelets: 253 10*3/uL (ref 150–400)
RBC: 2.84 MIL/uL — ABNORMAL LOW (ref 3.87–5.11)
RDW: 17.1 % — ABNORMAL HIGH (ref 11.5–15.5)
WBC: 13.8 10*3/uL — ABNORMAL HIGH (ref 4.0–10.5)

## 2018-03-14 LAB — COMPREHENSIVE METABOLIC PANEL
ALK PHOS: 74 U/L (ref 38–126)
ALT: 18 U/L (ref 14–54)
AST: 15 U/L (ref 15–41)
Albumin: 2.4 g/dL — ABNORMAL LOW (ref 3.5–5.0)
Anion gap: 11 (ref 5–15)
BILIRUBIN TOTAL: 0.5 mg/dL (ref 0.3–1.2)
BUN: 29 mg/dL — AB (ref 6–20)
CALCIUM: 8.2 mg/dL — AB (ref 8.9–10.3)
CO2: 33 mmol/L — ABNORMAL HIGH (ref 22–32)
CREATININE: 1.21 mg/dL — AB (ref 0.44–1.00)
Chloride: 85 mmol/L — ABNORMAL LOW (ref 101–111)
GFR calc non Af Amer: 44 mL/min — ABNORMAL LOW (ref >60)
GFR, EST AFRICAN AMERICAN: 51 mL/min — AB (ref >60)
Glucose, Bld: 144 mg/dL — ABNORMAL HIGH (ref 65–99)
Potassium: 5.1 mmol/L (ref 3.5–5.1)
Sodium: 129 mmol/L — ABNORMAL LOW (ref 135–145)
Total Protein: 5.7 g/dL — ABNORMAL LOW (ref 6.5–8.1)

## 2018-03-14 LAB — BLOOD GAS, ARTERIAL
Acid-Base Excess: 5.5 mmol/L — ABNORMAL HIGH (ref 0.0–2.0)
Bicarbonate: 33.6 mmol/L — ABNORMAL HIGH (ref 20.0–28.0)
O2 CONTENT: 5 L/min
O2 SAT: 95.4 %
PATIENT TEMPERATURE: 98.6
pCO2 arterial: 78.6 mmHg (ref 32.0–48.0)
pH, Arterial: 7.254 — ABNORMAL LOW (ref 7.350–7.450)
pO2, Arterial: 87.3 mmHg (ref 83.0–108.0)

## 2018-03-14 LAB — ABO/RH: ABO/RH(D): A POS

## 2018-03-14 LAB — PREPARE RBC (CROSSMATCH)

## 2018-03-14 LAB — PREALBUMIN: Prealbumin: 8.3 mg/dL — ABNORMAL LOW (ref 18–38)

## 2018-03-14 MED ORDER — SODIUM CHLORIDE 0.9 % IV SOLN
Freq: Once | INTRAVENOUS | Status: AC
  Administered 2018-03-14: 09:00:00 via INTRAVENOUS

## 2018-03-14 MED ORDER — FUROSEMIDE 10 MG/ML IJ SOLN
60.0000 mg | Freq: Once | INTRAMUSCULAR | Status: AC
  Administered 2018-03-14: 60 mg via INTRAVENOUS
  Filled 2018-03-14: qty 6

## 2018-03-14 MED ORDER — METOPROLOL TARTRATE 5 MG/5ML IV SOLN: 5.0000 mg | Freq: Four times a day (QID) | INTRAVENOUS | Status: DC | PRN

## 2018-03-14 MED ORDER — MORPHINE (PF) INJECTION FOR INHALATION 10 MG/ML: 10.0000 mg | RESPIRATORY_TRACT | Status: DC | PRN

## 2018-03-14 NOTE — Consult Note (Signed)
Radiation Oncology         ([phone]) [phone] ________________________________  Name: Alisha Fisher        MRN: [id number]  Date of Service: 02/12/2018 DOB: 12-03-1945  QH:[id number], [name], MD  No ref. provider found     REFERRING PHYSICIAN: No ref. provider found   DIAGNOSIS: The primary encounter diagnosis was Iron deficiency anemia due to chronic blood loss. Diagnoses of Pleural effusion, 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[place]), Dysphagia, CHF (congestive heart failure) ([place]), Acute respiratory failure ([place]), Lung cancer, lingula ([place]), AKI (acute kidney injury) ([place]), R[place], Dyspnea, Chronic bronchitis, unspecified chronic bronchitis type ([place]), Unilateral emphysema ([place]), and Chronic diastolic heart failure ([place]) were also pertinent to this visit.   HISTORY OF PRESENT ILLNESS: Alisha Fisher is a 72 y.o. female seen at the request of Dr. Marin Olp.  She is well known to radiation oncology service having had previous radiotherapy for treatment of her Stage IV NSCLC, squamous cell carcinoma of both lungs. She recently presented to the ED on 01/31/2018 with c/o increased chest congestion, cough, shortness of breath, vague malaise, fatigue and fever.  She is followed by Dr. Marin Olp for management of her lung cancer and had recently started immunotherapy with Keytruda 1 week prior to admission.   On admission, she was noted to have fever of 102.37F, HR of 138 and pulse O2 of 80% on home O2.  CXR showed a large right pleural effusion and pneumonia so she was started on Vancomycin and Azactam for CAP.  CT Chest showed continued presence of large, 7.6 by 7.6 cm right lower lobe mass with pleural spread, consistent with history of lung cancer as well as right paratracheal, subcarinal and right axillary adenopathy, consistent with progressive metastatic disease as previously diagnosed on recent PET scan dated 02/07/18.  Her condition worsened on 02/27/18, requiring transfer to the ICU for noninvasive mechanical ventilation in  addition to high dose steroids for management of her underlying severe COPD. CT angiogram showed a type A thoracic aortic dissection extending from the aortic root to the origin of the right brachycephalic artery with rapid enlargement of the ascending thoracic aorta, now measuring 4.7 cm, previously 4.0 cm on recent CT but otherwise unchanged from recent CT and PET with extensive tumor in the right lower lobe/hemithorax with pleural and nodal metastases.  She is not a surgical candidate for aortic dissection repair.     Her condition continued to deteriorate with increased SOB, cough and left chest pain so a repeat CT Angio was performed 03/13/18 and showed no significant change in mediastinal lymphadenopathy with index lower right paratracheal lymph node measuring approximately 2 cm. No change in extensive airspace consolidation and mass of the right lower lung extending to the pleura. There is associated complete airway obstruction of the right lower lobe and right middle lobe. The right upper lobe shows progressive volume loss and increased apical parenchymal opacity which may be consistent with a component of acute pneumonia. The progressive volume loss is associated with attenuation of right upper lobe bronchi and there may be a component of progressive malignancy causing further airway obstruction of the right upper lobe and now postobstructive pneumonitis of the right upper lobe. The left lung shows stable consolidative opacity in the anterior left upper lobe.and there is a stable small left pleural effusion.  We are consulted for consideration of palliative radiotherapy to the RLL mass as she is not felt to be a candidate for chemotherapy at her present condition and immunotherapy is not  likely to be effective while requiring high dose steroids for management of her COPD.   PREVIOUS RADIATION THERAPY: Yes  06/11/2017-06/26/2017?SBRT: Left Upper Lobe Lung/ 50 Gy in 5 fractions  04/11/17-04/22/17:    24 Gy in 8 fractions to the right lung/mediastinum  12/21/16-01/03/17:  30 Gy in 10 fractions to the right lung/mediastinum   PAST MEDICAL HISTORY:  Past Medical History:  Diagnosis Date  . Cancer ([place])    lung cancer  . COPD, severe ([place])   . Goals of care, counseling/discussion 02/12/2018  . HTN (hypertension)   . Iron deficiency anemia 11/07/2017  . Panic attack   . Pneumonia   . Pulmonary emphysema ([place])   . Sleep apnea    no cpap needs more recent sleep study  . SOB (shortness of breath)        PAST SURGICAL HISTORY: Past Surgical History:  Procedure Laterality Date  . CATARACT EXTRACTION    . ENDOBRONCHIAL ULTRASOUND Bilateral 12/17/2016   Procedure: ENDOBRONCHIAL ULTRASOUND;  Surgeon: Javier Glazier, MD;  Location: [facility];  Service: Cardiopulmonary;  Laterality: Bilateral;  . IR FLUORO GUIDE PORT INSERTION RIGHT  03/05/2018  . IR GENERIC HISTORICAL  12/21/2016   IR SINUS/FIST TUBE CHK-NON GI 12/21/2016 [facility]  . IR GENERIC HISTORICAL  12/21/2016   IR US GUIDE BX ASP/DRAIN 12/21/2016 Corrie Mckusick, DO [facility]  . IR RADIOLOGIST EVAL & MGMT  01/17/2017  . IR US GUIDE VASC ACCESS RIGHT  03/05/2018  . VIDEO BRONCHOSCOPY WITH ENDOBRONCHIAL ULTRASOUND N/A 12/10/2017   Procedure: VIDEO BRONCHOSCOPY WITH ENDOBRONCHIAL ULTRASOUND;  Surgeon: Collene Gobble, MD;  Location: [facility] OR;  Service: Thoracic;  Laterality: N/A;  . WISDOM TOOTH EXTRACTION       FAMILY HISTORY:  Family History  Problem Relation Age of Onset  . Allergies Unknown   . COPD Cousin   . Breast cancer Mother   . Diabetes Father   . Diabetes Son      SOCIAL HISTORY:  reports that she quit smoking about 16 months ago. Her smoking use included cigarettes. She has a 25.00 pack-year smoking history. She has never used smokeless tobacco. She reports that she does not drink alcohol or use drugs.   ALLERGIES: Ciprofloxacin; Flagyl [metronidazole]; and Zosyn [piperacillin sod-tazobactam  so]   MEDICATIONS:  Current Facility-Administered Medications  Medication Dose Route Frequency Provider Last Rate Last Dose  . acetaminophen (TYLENOL) tablet 650 mg  650 mg Oral Q6H PRN Purohit, Shrey C, MD   650 mg at 03/12/18 1132   Or  . acetaminophen (TYLENOL) suppository 650 mg  650 mg Rectal Q6H PRN Purohit, Shrey C, MD      . alum & mag hydroxide-simeth (MAALOX/MYLANTA) 200-200-20 MG/5ML suspension 15 mL  15 mL Oral Q4H PRN Purohit, Shrey C, MD   15 mL at 03/10/18 1818  . amLODipine (NORVASC) tablet 5 mg  5 mg Oral Daily Purohit, Shrey C, MD   5 mg at 03/13/18 0925  . aspirin chewable tablet 324 mg  324 mg Oral Daily Purohit, Shrey C, MD   324 mg at 03/13/18 0921  . benzonatate (TESSALON) capsule 200 mg  200 mg Oral TID PRN Bodenheimer, Charles A, NP      . ceFEPIme (MAXIPIME) 1 g in sodium chloride 0.9 % 100 mL IVPB  1 g Intravenous Q8H Angela Adam, [facility]   Stopped at 03/14/18 1643  . clonazePAM (KLONOPIN) tablet 1 mg  1 mg Oral TID PRN Volanda Napoleon, MD  1 mg at 03/14/18 0521  . dextromethorphan-guaiFENesin (MUCINEX DM) 30-600 MG per 12 hr tablet 1 tablet  1 tablet Oral BID Purohit, Konrad Dolores, MD   1 tablet at 03/13/18 0921  . diltiazem (CARDIZEM) 100 mg in dextrose 5% 155mL (1 mg/mL) infusion  5-15 mg/hr Intravenous Continuous Gardiner Barefoot, NP 10 mL/hr at 03/14/18 1358 10 mg/hr at 03/14/18 1358  . diphenhydrAMINE (BENADRYL) capsule 25 mg  25 mg Oral Q6H PRN Adrian Saran, RPH   25 mg at 03/13/18 1835  . fluticasone (FLONASE) 50 MCG/ACT nasal spray 2 spray  2 spray Each Nare Daily PRN Purohit, Shrey C, MD      . furosemide (LASIX) tablet 40 mg  40 mg Oral Daily Purohit, Shrey C, MD   40 mg at 03/13/18 0925  . HYDROcodone-acetaminophen (NORCO/VICODIN) 5-325 MG per tablet 1 tablet  1 tablet Oral Q6H PRN Purohit, Konrad Dolores, MD   1 tablet at 03/09/18 2225  . levalbuterol (XOPENEX) nebulizer solution 0.63 mg  0.63 mg Nebulization Q4H PRN Purohit, Shrey C, MD   0.63 mg at  03/13/18 1359  . levalbuterol (XOPENEX) nebulizer solution 0.63 mg  0.63 mg Nebulization TID Irene Pap N, DO   0.63 mg at 03/14/18 1338  . LORazepam (ATIVAN) injection 1 mg  1 mg Intravenous Q4H PRN Purohit, Shrey C, MD   1 mg at 03/14/18 1411  . methylPREDNISolone sodium succinate (SOLU-MEDROL) 125 mg/2 mL injection 80 mg  80 mg Intravenous Q6H Volanda Napoleon, MD   80 mg at 03/14/18 1411  . metoprolol succinate (TOPROL-XL) 24 hr tablet 200 mg  200 mg Oral Daily Purohit, Shrey C, MD   200 mg at 03/13/18 0925  . mometasone-formoterol (DULERA) 200-5 MCG/ACT inhaler 2 puff  2 puff Inhalation BID Purohit, Konrad Dolores, MD   2 puff at 03/14/18 0755  . morphine (morphine sulfate) injection for inhalation 10 mg  10 mg Inhalation Q2H PRN Volanda Napoleon, MD      . morphine 2 MG/ML injection 0.5 mg  0.5 mg Intravenous Q6H PRN Purohit, Shrey C, MD   0.5 mg at 03/13/18 2000  . ondansetron (ZOFRAN) tablet 4 mg  4 mg Oral Q6H PRN Purohit, Shrey C, MD   4 mg at 03/04/18 1008   Or  . ondansetron (ZOFRAN) injection 4 mg  4 mg Intravenous Q6H PRN Purohit, Konrad Dolores, MD   4 mg at 03/13/18 0907  . pantoprazole (PROTONIX) EC tablet 40 mg  40 mg Oral BID Purohit, Shrey C, MD   40 mg at 03/13/18 2003  . protein supplement (PREMIER PROTEIN) liquid  11 oz Oral Q24H Purohit, Shrey C, MD   11 oz at 03/09/18 1732  . sodium chloride flush (NS) 0.9 % injection 10-40 mL  10-40 mL Intracatheter Q12H Purohit, Shrey C, MD   10 mL at 03/09/18 0956  . sodium chloride flush (NS) 0.9 % injection 10-40 mL  10-40 mL Intracatheter PRN Purohit, Konrad Dolores, MD   10 mL at 03/12/18 7408  . sodium chloride flush (NS) 0.9 % injection 10-40 mL  10-40 mL Intracatheter Q12H Purohit, Shrey C, MD   10 mL at 03/12/18 2116  . sodium chloride flush (NS) 0.9 % injection 10-40 mL  10-40 mL Intracatheter PRN Purohit, Shrey C, MD      . umeclidinium-vilanterol (ANORO ELLIPTA) 62.5-25 MCG/INH 1 puff  1 puff Inhalation Daily Purohit, Konrad Dolores, MD   1 puff at  03/14/18 [phone]  REVIEW OF SYSTEMS: On review of systems, the patient reports that she continues with progressive SOB and cough, especially when trying to talk.  She reports that her breathing is "ok" when she is at rest. She denies any chest pain, hemoptysis, fevers, chills, night sweats, or unintended weight changes. She denies any bowel or bladder disturbances, and denies abdominal pain, nausea or vomiting. She denies any new musculoskeletal or joint aches or pains. A complete review of systems is obtained and is otherwise negative.   PHYSICAL EXAM:  Wt Readings from Last 3 Encounters:  03/09/18 250 lb 3.6 oz (113.5 kg)  01/27/18 236 lb (107 kg)  12/10/17 239 lb (108.4 kg)   Temp Readings from Last 3 Encounters:  03/14/18 97.6 F (36.4 C) (Axillary)  02/19/18 98.1 F (36.7 C) (Oral)  02/04/18 97.9 F (36.6 C) (Oral)   BP Readings from Last 3 Encounters:  03/14/18 (!) 145/84  02/19/18 (!) 144/56  02/04/18 (!) 132/55   Pulse Readings from Last 3 Encounters:  03/14/18 (!) 108  02/19/18 87  02/04/18 (!) 110   Pain Assessment Pain Score: 0-No pain/10  In general this is an ill appearing caucasian female in no acute distress. She is alert and oriented x4 and appropriate throughout the examination. HEENT reveals that the patient is normocephalic, atraumatic. EOMs are intact. PERRLA. Skin is intact without any evidence of gross lesions. Cardiovascular exam reveals a regular rate and rhythm, no clicks rubs or murmurs are auscultated. Chest with diffuse, course rales bilaterally. No wheezing but decreased breath sounds, R>L.  Lymphatic assessment is performed and does not reveal any adenopathy in the cervical, supraclavicular, axillary, or inguinal chains. Abdomen has active bowel sounds in all quadrants and is intact. The abdomen is soft, non tender, non distended. Lower extremities are negative for pretibial pitting edema, deep calf tenderness, cyanosis or clubbing.   ECOG = 4  0 -  Asymptomatic (Fully active, able to carry on all predisease activities without restriction)  1 - Symptomatic but completely ambulatory (Restricted in physically strenuous activity but ambulatory and able to carry out work of a light or sedentary nature. For example, light housework, office work)  2 - Symptomatic, <50% in bed during the day (Ambulatory and capable of all self care but unable to carry out any work activities. Up and about more than 50% of waking hours)  3 - Symptomatic, >50% in bed, but not bedbound (Capable of only limited self-care, confined to bed or chair 50% or more of waking hours)  4 - Bedbound (Completely disabled. Cannot carry on any self-care. Totally confined to bed or chair)  5 - Death   Eustace Pen MM, Creech RH, Tormey DC, et al. [phone]). "Toxicity and response criteria of the [facility] [employer]". [place] Oncol. 5 (6): 649-55    LABORATORY DATA:  Lab Results  Component Value Date   WBC 13.8 (H) 03/14/2018   HGB 8.5 (L) 03/14/2018   HCT 27.8 (L) 03/14/2018   MCV 97.9 03/14/2018   PLT 253 03/14/2018   Lab Results  Component Value Date   NA 129 (L) 03/14/2018   K 5.1 03/14/2018   CL 85 (L) 03/14/2018   CO2 33 (H) 03/14/2018   Lab Results  Component Value Date   ALT 18 03/14/2018   AST 15 03/14/2018   ALKPHOS 74 03/14/2018   BILITOT 0.5 03/14/2018      RADIOGRAPHY: Ct Chest W Contrast  Result Date: 02/25/2018 CLINICAL DATA:  Dyspnea. EXAM: CT CHEST  WITH CONTRAST TECHNIQUE: Multidetector CT imaging of the chest was performed during intravenous contrast administration. CONTRAST:  40mL OMNIPAQUE IOHEXOL 300 MG/ML  SOLN COMPARISON:  PET scan of Feb 07, 2018.  CT scan of August 07, 2017. FINDINGS: Cardiovascular: Atherosclerosis of thoracic aorta is noted without aneurysm formation. Coronary artery calcifications are noted. No pericardial effusion is noted. Mediastinum/Nodes: Thyroid gland and esophagus are unremarkable. Subcarinal  adenopathy measuring 3.5 cm is noted. 2.4 cm right paratracheal adenopathy is noted. 12 mm right axillary lymph node is noted. Lungs/Pleura: No pneumothorax is noted. Continued presence of large mass in right lower lobe consistent with malignancy. Pleural spread of tumor is also noted and unchanged. Radiation fibrosis is noted medially in right upper lobe. Stable scarring or inflammation is noted anteriorly in the left upper lobe. Upper Abdomen: No acute abnormality. Musculoskeletal: No chest wall abnormality. No acute or significant osseous findings. IMPRESSION: Continued presence of large right lower lobe mass with pleural spread is noted consistent with history of lung cancer. 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 is well. Stable scarring or inflammation is noted anteriorly in the left upper lobe. Right paratracheal and subcarinal and right axillary adenopathy is noted consistent with metastatic disease. Coronary artery calcifications are noted suggesting coronary artery disease. Aortic Atherosclerosis (ICD10-I70.0). Electronically Signed   By: Marijo Conception, M.D.   On: 02/19/2018 19:10   Ct Angio Chest Pe W Or Wo Contrast  Result Date: 02/27/2018 CLINICAL DATA:  Lung cancer, elevated D-dimer EXAM: CT ANGIOGRAPHY CHEST WITH CONTRAST TECHNIQUE: Multidetector CT imaging of the chest was performed using the standard protocol during bolus administration of intravenous contrast. Multiplanar CT image reconstructions and MIPs were obtained to evaluate the vascular anatomy. CONTRAST:  114mL ISOVUE-370 IOPAMIDOL (ISOVUE-370) INJECTION 76% COMPARISON:  CT chest dated 01/31/2018 FINDINGS: Cardiovascular: Satisfactory opacification the bilateral pulmonary arteries to the lobar level. No evidence of pulmonary embolism. 4.7 cm ascending thoracic aortic aneurysm, rapidly increasing from recent CT chest, when it measured 4.0 cm. Although unusual in appearance and suboptimally evaluated due to study technique,  there is differential enhancement within the lumen, suggesting in ascending thoracic aortic dissection (series 6/image 44). This is favored to be a type A aortic dissection arising at the aortic root and terminating at the origin of the right brachiocephalic artery. Cardiomegaly.  No pericardial effusion. Atherosclerotic calcifications of the aortic arch. Three vessel coronary atherosclerosis. Enlargement of the main pulmonary artery, raising the possibility of pulmonary arterial hypertension. Mediastinum/Nodes: 13 mm short axis distinct subcarinal node. 2.0 cm short axis azygoesophageal recess node. Visualized thyroid is unremarkable. Lungs/Pleura: Large right lower lobe mass, unchanged from recent CT, with lymphangitic and pleural spread in the right hemithorax. Radiation changes in the anterior left upper lobe. Patchy left lower lobe opacity, likely atelectasis. Small pleural effusion. Mild centrilobular and paraseptal emphysematous changes, upper lobe predominant. No pneumothorax. Upper Abdomen: Visualized upper abdomen is grossly unremarkable, noting a mildly nodular hepatic contour. Musculoskeletal: Visualized osseous structures are within normal limits. Review of the MIP images confirms the above findings. IMPRESSION: No evidence of pulmonary embolism. 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 Rapid enlargement of the ascending thoracic aorta, now measuring 4.7 cm, previously 4.0 cm on recent CT. Thoracic surgery consultation is suggested. Otherwise unchanged from recent CT and PET. Extensive tumor in the right lower lobe/hemithorax with pleural and nodal  metastases, better evaluated on prior studies. Critical Value/emergent results were called by telephone at the time of interpretation on 02/27/2018 at 9:57 pm to Dr. Halford Chessman, who verbally acknowledged these  results. Aortic Atherosclerosis (ICD10-I70.0) and Emphysema (ICD10-J43.9). Electronically Signed   By: Julian Hy M.D.   On: 02/27/2018 22:00   [place] Chest (pleural Effusion)  Result Date: 03/11/2018 CLINICAL DATA:  72 year old female with possible pleural effusion EXAM: CHEST ULTRASOUND COMPARISON:  CT 02/27/2018 FINDINGS: Limited ultrasound of the right chest demonstrates no pleural fluid. Thoracentesis not performed. IMPRESSION: No right-sided pleural fluid identified by ultrasound. Thoracentesis not performed. Electronically Signed   By: Corrie Mckusick D.O.   On: 03/11/2018 14:55   Dg Esophagus  Result Date: 02/26/2018 CLINICAL DATA:  Dysphagia.  Metastatic lung cancer. EXAM: ESOPHOGRAM/BARIUM SWALLOW TECHNIQUE: Single contrast examination was performed using  thin barium. FLUOROSCOPY TIME:  Fluoroscopy Time:  42 seconds Radiation Exposure Index (if provided by the fluoroscopic device): 26.1 mGy COMPARISON:  CT scan of the chest dated 01/31/2018 FINDINGS: There is a slight mass effect upon the right side of the esophagus just below the carina consistent with the adenopathy seen on chest x-ray. However, there is no stricture at that site. The patient has poor esophageal motility with a poor secondary stripping wave with to and fro peristalsis in the mid esophagus with intermittent tertiary contractions in the mid esophagus. There is a small hiatal hernia without a stricture. Note is made of a moderate right effusion as well as bibasilar lung consolidation, right greater than left. IMPRESSION: 1. Poor esophageal motility with to and fro peristalsis in the mid esophagus with occasional tertiary contractions in the mid esophagus. 2. Slight mass effect on the mid esophagus due to subcarinal adenopathy. However, this does not cause significant narrowing of the esophagus. 3. Small hiatal hernia without a stricture. Electronically Signed   By: Lorriane Shire M.D.   On: 02/26/2018 16:02   Ir US Guide Vasc  Access Right  Result Date: 03/05/2018 CLINICAL DATA:  Metastatic lung cancer EXAM: RIGHT INTERNAL JUGULAR SINGLE LUMEN POWER PORT CATHETER INSERTION Date:  03/05/2018 03/05/2018 4:14 pm Radiologist:  Jerilynn Mages. Daryll Brod, MD Guidance:  Ultrasound fluoroscopic MEDICATIONS: Clindamycin 600 mg IV; The antibiotic was administered within an appropriate time interval prior to skin puncture. ANESTHESIA/SEDATION: Versed 1.5 mg IV; Fentanyl 75 mcg IV; Moderate Sedation Time:  31 minutes The patient was continuously monitored during the procedure by the interventional radiology nurse under my direct supervision. FLUOROSCOPY TIME:  30 seconds (10 mGy) COMPLICATIONS: None immediate. CONTRAST:  None. PROCEDURE: Informed consent was obtained from the patient following explanation of the procedure, risks, benefits and alternatives. The patient understands, agrees and consents for the procedure. All questions were addressed. A time out was performed. Maximal barrier sterile technique utilized including caps, mask, sterile gowns, sterile gloves, large sterile drape, hand hygiene, and 2% chlorhexidine scrub. Under sterile conditions and local anesthesia, right internal jugular micropuncture venous access was performed. Access was performed with ultrasound. Images were obtained for documentation of the patent right internal jugular vein. A guide wire was inserted followed by a transitional dilator. This allowed insertion of a guide wire and catheter into the IVC. Measurements were obtained from the SVC / RA junction back to the right IJ venotomy site. In the right infraclavicular chest, a subcutaneous pocket was created over the second anterior rib. This was done under sterile conditions and local anesthesia. 1% lidocaine with epinephrine was utilized for this. A 2.5 cm incision was made  in the skin. Blunt dissection was performed to create a subcutaneous pocket over the right pectoralis major muscle. The pocket was flushed with saline  vigorously. There was adequate hemostasis. The port catheter was assembled and checked for leakage. The port catheter was secured in the pocket with two retention sutures. The tubing was tunneled subcutaneously to the right venotomy site and inserted into the SVC/RA junction through a valved peel-away sheath. Position was confirmed with fluoroscopy. Images were obtained for documentation. The patient tolerated the procedure well. No immediate complications. Incisions were closed in a two layer fashion with 4 - 0 Vicryl suture. Dermabond was applied to the skin. The port catheter was accessed, blood was aspirated followed by saline and heparin flushes. Needle was removed. A dry sterile dressing was applied. IMPRESSION: Ultrasound and fluoroscopically guided right internal jugular single lumen power port catheter insertion. Tip in the SVC/RA junction. Catheter ready for use. Electronically Signed   By: Jerilynn Mages.  Shick M.D.   On: 03/05/2018 16:23   Dg Chest Port 1 View  Result Date: 03/11/2018 CLINICAL DATA:  [place] EXAM: PORTABLE CHEST 1 VIEW COMPARISON:  03/06/2018 FINDINGS: Right Port-A-Cath remains in place, unchanged. Cardiomegaly with vascular congestion. Moderate, likely loculated right pleural effusion. Worsening aeration throughout the right lung with increasing diffuse right lung airspace disease. Increasing left upper lobe airspace opacity. IMPRESSION: Worsening airspace disease diffusely throughout the right lung and in the left upper lobe. Moderate, loculated right pleural effusion, slightly increased. Cardiomegaly, vascular congestion. Electronically Signed   By: Rolm Baptise M.D.   On: 03/11/2018 09:31   Dg Chest Port 1 View  Result Date: 03/06/2018 CLINICAL DATA:  Per order- acute respiratory failure Pt HX: COPD, HTN, ex smoker EXAM: PORTABLE CHEST 1 VIEW COMPARISON:  03/04/2018 FINDINGS: Patient is a RIGHT-sided PowerPort catheter tip overlying the level of superior vena cava. The cardiac silhouette is  enlarged and stable in configuration. There is persistent significant opacity in the RIGHT LOWER lobe and associated RIGHT pleural effusion. There is improved aeration in the LEFT LOWER lobe. IMPRESSION: 1. Persistent opacity in the RIGHT lung. 2. Improved aeration in the LEFT lung. 3. Stable cardiomegaly Electronically Signed   By: Nolon Nations M.D.   On: 03/06/2018 07:06   Dg Chest Port 1 View  Result Date: 03/04/2018 CLINICAL DATA:  Acute respiratory failure. History of COPD, former smoker, lung malignancy. EXAM: PORTABLE CHEST 1 VIEW COMPARISON:  Portable chest x-ray of March 01, 2018 FINDINGS: The lungs are adequately inflated. Patchy airspace opacity in the left perihilar region persists but is slightly less conspicuous today. There is stable volume loss on the right with pleural thickening or pleural fluid overlying the superior and lateral aspects of the right lung. Increased density at the right lung base persists consistent with a known mass. The cardiac silhouette is enlarged. The pulmonary vascularity is mildly prominent. There is calcification in the wall of the aortic arch. The observed bony thorax is unremarkable. IMPRESSION: Persistent airspace opacity in the left perihilar region likely reflects pneumonia. Persistent volume loss on the right with increased mid and lower right hemithorax density consistent with pleural fluid and a known mass. Persistent cardiomegaly with slightly increased pulmonary vascular prominence consistent with mild CHF. Electronically Signed   By: David  Martinique M.D.   On: 03/04/2018 07:20   Dg Chest Port 1 View  Result Date: 03/01/2018 CLINICAL DATA:  Acute respiratory failure. History of hypertension, lung cancer, COPD, PE, former smoker. EXAM: PORTABLE CHEST 1 VIEW COMPARISON:  Chest  x-rays dated 02/27/2018 and 01/29/2018. FINDINGS: Cardiomegaly is stable. Overall cardiomediastinal silhouette is stable. Atherosclerotic changes again noted at the aortic arch. Stable  opacity at the RIGHT lung base. New platelike opacity within the LEFT perihilar lung, pneumonia versus atelectasis. No pneumothorax seen. IMPRESSION: 1. Stable cardiomegaly. 2. Persistent opacity at the RIGHT lung base, compatible with the mass/consolidation demonstrated on recent chest CT of 02/27/2018. 3. New platelike opacity within the LEFT perihilar lung, pneumonia versus atelectasis, less likely asymmetric edema. Electronically Signed   By: Franki Cabot M.D.   On: 03/01/2018 07:37   Dg Chest Port 1 View  Result Date: 02/27/2018 CLINICAL DATA:  72 year old female with large right lower lung mass, lung cancer. Shortness of breath. EXAM: PORTABLE CHEST 1 VIEW COMPARISON:  Chest CT and radiographs 02/22/2018. FINDINGS: Portable AP semi upright view at 0433 hours. Continued dense right lower lung and retrocardiac opacity corresponding to the lung mass/drowned lung. Mildly increased pulmonary vascularity but no overt edema. Increased left lung base opacity most resembling atelectasis. No pneumothorax. No large pleural effusion. Stable cardiac size and mediastinal contours. Visualized tracheal air column is within normal limits. IMPRESSION: 1. Increased left lung base atelectasis and pulmonary vascular congestion since 02/26/2018. 2. Continued dense right lower lung opacification due to lung mass and drowned lung. Electronically Signed   By: Genevie Ann M.D.   On: 02/27/2018 08:31   Dg Chest Port 1 View  Result Date: 02/28/2018 CLINICAL DATA:  Shortness of breath. EXAM: PORTABLE CHEST 1 VIEW COMPARISON:  Chest CT 02/07/2018 FINDINGS: Cardiomediastinal silhouette is normal. Calcific atherosclerotic disease and tortuosity of the aorta. There is no evidence of pneumothorax. Similar appearance of right hemithorax with airspace opacity in the right lower lobe, likely representing tumor and diffuse peribronchovascular infiltrate versus lymphangitic spread of tumor. Interval development of right pleural effusion.  Chronic interstitial lung changes versus metastatic disease in the left upper lobe of the lung also stable. Osseous structures are without acute abnormality. Soft tissues are grossly normal. IMPRESSION: Similar abnormal appearance of the lungs with airspace opacity likely representing the known pulmonary mass in the right lower lobe, extensive peribronchial airspace consolidation versus lymphangitic spread of tumor. Interval enlargement of right pleural effusion. Chronic interstitial lung scarring versus metastatic disease in the left upper lobe, stable. Electronically Signed   By: Fidela Salisbury M.D.   On: 02/23/2018 15:12   Ct Angio Chest Aorta W/cm &/or Wo/cm  Result Date: 03/12/2018 CLINICAL DATA:  History of squamous carcinoma of the right lower lung, type A aortic dissection, increased shortness of breath, cough and left chest pain. EXAM: CT ANGIOGRAPHY CHEST WITH CONTRAST TECHNIQUE: Multidetector CT imaging of the chest was performed using the standard protocol during bolus administration of intravenous contrast. Multiplanar CT image reconstructions and MIPs were obtained to evaluate the vascular anatomy. 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 This begins just above the aortic root and can be followed into the aortic arch. Due to suboptimal opacification of the descending thoracic aorta, the distal extent of dissection cannot be adequately determined. There is again associated aneurysmal dilatation of the ascending thoracic aorta which measures 5.1 cm in maximal diameter. When remeasured on the prior study, the aorta likely measured as much as 5 cm on the prior study. There is no evidence of mediastinal hemorrhage or findings to suggest overt aortic rupture. The  study was targeted for timing of pulmonary arterial  opacification. There is no evidence of pulmonary embolism. The heart is moderately enlarged. Some degree of elevated right heart pressures and right heart failure suspected based on reflux of contrast into the intrahepatic IVC and hepatic veins. Mediastinum/Nodes: No significant change in mediastinal lymphadenopathy. Index lower right paratracheal lymph node measures approximately 2 cm. Lungs/Pleura: No change in extensive airspace consolidation and mass of the right lower lung extending to the pleura. Associated complete airway obstruction of the right lower lobe and right middle lobe. The right upper lobe shows progressive volume loss and increased apical parenchymal opacity which may be consistent with a component of acute pneumonia. The progressive volume loss is associated with attenuation of right upper lobe bronchi and there may be a component of progressive malignancy causing further airway obstruction of the right upper lobe and now postobstructive pneumonitis of the right upper lobe. The left lung shows stable consolidative opacity in the anterior left upper lobe. There is a stable small left pleural effusion. Upper Abdomen: No acute abnormality. Musculoskeletal: No chest wall abnormality. No acute or significant osseous findings. Review of the MIP images confirms the above findings. IMPRESSION: 1. 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 No evidence of mediastinal hemorrhage. Distal extent of the aortic dissection is not well delineated due to suboptimal opacification of the aorta. 2. No evidence of acute pulmonary embolism. 3. Evidence of right heart failure. 4. No change in extensive tumor of the right lower lobe and middle lobe with complete airway obstruction. 5. Some progressive opacity in the right upper lobe is suspicious for acute pneumonia. Right upper lobe bronchi appear more attenuated and further extension of tumor causing  postobstructive pneumonitis may also be a cause of increased right upper lobe airspace disease. 6. Stable consolidative changes in the anterior left upper lobe and small left pleural effusion. Electronically Signed   By: Aletta Edouard M.D.   On: 03/12/2018 08:48   Ir Fluoro Guide Port Insertion Right  Result Date: 03/05/2018 CLINICAL DATA:  Metastatic lung cancer EXAM: RIGHT INTERNAL JUGULAR SINGLE LUMEN POWER PORT CATHETER INSERTION Date:  03/05/2018 03/05/2018 4:14 pm Radiologist:  Jerilynn Mages. Daryll Brod, MD Guidance:  Ultrasound fluoroscopic MEDICATIONS: Clindamycin 600 mg IV; The antibiotic was administered within an appropriate time interval prior to skin puncture. ANESTHESIA/SEDATION: Versed 1.5 mg IV; Fentanyl 75 mcg IV; Moderate Sedation Time:  31 minutes The patient was continuously monitored during the procedure by the interventional radiology nurse under my direct supervision. FLUOROSCOPY TIME:  30 seconds (10 mGy) COMPLICATIONS: None immediate. CONTRAST:  None. PROCEDURE: Informed consent was obtained from the patient following explanation of the procedure, risks, benefits and alternatives. The patient understands, agrees and consents for the procedure. All questions were addressed. A time out was performed. Maximal barrier sterile technique utilized including caps, mask, sterile gowns, sterile gloves, large sterile drape, hand hygiene, and 2% chlorhexidine scrub. Under sterile conditions and local anesthesia, right internal jugular micropuncture venous access was performed. Access was performed with ultrasound. Images were obtained for documentation of the patent right internal jugular vein. A guide wire was inserted followed by a transitional dilator. This allowed insertion of a guide wire and catheter into the IVC. Measurements were obtained from the SVC / RA junction back to the right IJ venotomy site. In the right infraclavicular chest, a subcutaneous pocket was created over the second anterior rib. This  was done under sterile conditions and local  anesthesia. 1% lidocaine with epinephrine was utilized for this. A 2.5 cm incision was made in the skin. Blunt dissection was performed to create a subcutaneous pocket over the right pectoralis major muscle. The pocket was flushed with saline vigorously. There was adequate hemostasis. The port catheter was assembled and checked for leakage. The port catheter was secured in the pocket with two retention sutures. The tubing was tunneled subcutaneously to the right venotomy site and inserted into the SVC/RA junction through a valved peel-away sheath. Position was confirmed with fluoroscopy. Images were obtained for documentation. The patient tolerated the procedure well. No immediate complications. Incisions were closed in a two layer fashion with 4 - 0 Vicryl suture. Dermabond was applied to the skin. The port catheter was accessed, blood was aspirated followed by saline and heparin flushes. Needle was removed. A dry sterile dressing was applied. IMPRESSION: Ultrasound and fluoroscopically guided right internal jugular single lumen power port catheter insertion. Tip in the SVC/RA junction. Catheter ready for use. Electronically Signed   By: Jerilynn Mages.  Shick M.D.   On: 03/05/2018 16:23       IMPRESSION/PLAN: 1. 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 Today, we talked to the patient and family about the findings and workup thus far. We discussed the natural history of metastatic carcinoma and general treatment, highlighting the role of palliative radiotherapy in the management. We discussed the available radiation techniques, and focused on the details of logistics and delivery. The recommendation is for a 2 week course of daily radiotherapy to the obstructing RLL mass.  We reviewed the anticipated acute and late sequelae associated with radiation in this setting. The patient was encouraged to ask questions that were answered to her  satisfaction.  At the conclusion of our conversation, the patient elects to proceed with palliative radiotherapy to the RLL.  She has freely signed written consent to proceed and a copy of this document has been placed in her medical record. She will undergo CT SIM/treatment planning this afternoon in anticipation of beginning treatment on Monday June 17th.  The above documentation reflects my direct findings during this shared patient visit. Please see the separate note by Dr. Lisbeth Renshaw on this date for the remainder of the patient's plan of care.    Nicholos Johns, PA-C

## 2018-03-14 NOTE — Progress Notes (Signed)
RN observed patient's son giving her a sip of tea from straw.  Son states, " I know but she keeps getting on me." Patient and son re-educated on safety and the importance of patient's NPO status. Both verbalize understanding.  Son states, "It won't happen again."

## 2018-03-14 NOTE — Progress Notes (Addendum)
Patient found to be coughing uncontrollably after taking a bite of some bacon given to her by her son. O2 Sats 68-84%4L [place]. HR 120s; MD paged and responds to bedside promptly;  O2 increased to 10L O2 sats improved to 95%; then O2 decreased to 5L.  Patient maintaining sats of 89-95% on 5L.  RT responds to bedside as well; ABG pending.  Patient now NPO.  Will titrate cardizem for HR as needed rather than give metoprolol per MD.  Will continue to monitor for changes.

## 2018-03-14 NOTE — Progress Notes (Signed)
Ms. Wilmouth may be a little bit better this morning.  She is not as anxious.  Her hemoglobin is dropping.  I think that she would benefit from 2 units of blood.  I talked her about this today.  I explained to her what a blood transfusion is.  I explained to her that it is safe.  She would not get HIV or hepatitis.  I think it would help her overall status.  I think would help her breathing a little bit.  She agrees to have this.  She apparently has atrial fibrillation.  This also is not helping her breathing.  I will see if radiation oncology can help out with this right lung obstruction.  The one problem that I see is she has this aortic dissection.  I am not sure that radiation can be given in view of this as radiation could weaken the wall of the aorta and because the dissection to burst.  I talked to Ms. Smylie about this possibility.  I think radiation would be the only way of trying to help her out.  She still is having a difficult time understanding that we really have very few options at this point.  She is not a candidate for systemic chemotherapy unless she improves dramatically.  She is on high-dose steroids right now.  May be, this will help with her breathing and help with any inflammation.  Her blood sugar is 144.  I am awaiting the pre-albumin.  I think this is an important test to have to assess her prognosis.  I know that this is a very difficult problem and difficult situation for Ms. Hinshaw.  I am trying to get her to understand that sometimes with cancer, we just cannot do what we would like to do and that if we do treat, treatment just may not work.  I told her that we would not use immunotherapy any longer.  I does do not think that this is going to be effective.  In addition, she is on steroids which would negate the effect of immunotherapy.  Lattie Haw, MD  Hebrews 12:12

## 2018-03-14 NOTE — Progress Notes (Signed)
PT Cancellation Note  Patient Details Name: Alisha Fisher MRN: [id number] DOB: 03/09/1946   Cancelled Treatment:     pt had aspiration episode earlier today, now NPO.  Then out of room for Radiation.  Now pt stated she was too tired to try anything "right now".  Pt has been evaluated with rec for SNF as she requires + 2 assist for transfers (last session).    Rica Koyanagi  PTA [facility]  Acute  Rehab Pager      [phone]

## 2018-03-14 NOTE — Progress Notes (Signed)
Patient's sister requesting MD call. [name]: [phone]

## 2018-03-14 NOTE — Progress Notes (Signed)
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  03/11/2018: Patient seen and examined at her bedside.  She reports persistent productive cough.  Later this morning RN reports complaints of mild chest tightness.  Chest x-ray repeated, personally reviewed and revealed worsening infiltrates on the right lower mid and upper lungs as well as a right pleural effusion.  CT angio aorta ordered to further evaluate thoracic aorta dissection as well as right thoracentesis.   03/12/2018: Patient seen and examined at bedside.  She has no new complaints.  She wore her CPAP this morning.  Unable to obtain right thoracentesis yesterday due to not enough fluid in the pleural space.  03/13/18: Seen and examined at bedside. Persistent dyspnea at rest. Poor prognosis. Contacted oncology and palliative care team for goals of care.  03/14/18: Patient seen and examined at her bedside.  Suspect aspiration event this morning.  Patient has family, son at bedside who is giving food to the patient resulting in choking.  Educated on compliance.    Assessment/Plan: Principal Problem:   Acute on chronic respiratory failure with hypercapnia (HCC) Active Problems:   Obesity   COPD (chronic obstructive pulmonary disease) (HCC)   Essential hypertension   Squamous cell lung cancer, right (HCC)   Chronic diastolic heart failure (HCC)   OSA (obstructive sleep apnea)   Iron deficiency anemia   Anxiety disorder   Goals of care, counseling/discussion   COPD with  acute exacerbation (HCC)   Pleural effusion   Community acquired pneumonia   Community acquired pneumonia of right lower lobe of lung ([place])   AKI (acute kidney injury) ([place])   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 Highly appreciate recommendations.  Sepsis secondary to unclear source Suspect aspiration C/w IV cefepime States zosyn gives her a rash Continue to monitor fever curve Repeat CBC in the morning  Right lower lobe squamous cell carcinoma worsening with possible obstruction of right lung Might benefit from radiation therapy per oncology.  RUE edema/tenderness Duplex US negative for DVT  Thoracic aortic dissection CTA revealed rapid increase of aneurysm from 4.02 4.7 cm with high suspicion for dissection Repeat CTA chest aorta today Strict blood pressure control  OSA/obesity hypoventilation syndrome Overnight BiPAP  Chronic A. fib with RVR On metoprolol po daily C/w Iv cardizem and titrate as indicated On aspirin 324 mg daily Last echo done on 02/28/2018 revealed normal EF with no wall motion abnormality  Goals of care Poor prognosis Palliative care consulted DNR  Medical non compliance Needs reinforcement Intermittently declines BIPAP    Code Status: DNR  Family Communication: None at bedside  Disposition Plan: Home/SNF when clinically stable   Consultants:  Palliative care  Oncology  PCCM  Interventional radiology  Procedures:  right IJ Port-A-Cath placement  Antimicrobials:  Completed IV antibiotics  IV ceftriaxone and IV azithromycin  DVT prophylaxis: SCDs Not on chemical DVT prophylaxis due to known aortic dissection   Objective: Vitals:   03/14/18 1255 03/14/18 1338 03/14/18 1348 03/14/18 1416  BP:   122/65   Pulse:  98 85   Resp:  (!) 22 19   Temp:   98 F (36.7 C)   TempSrc:   Oral   SpO2: 98% 96% (!) 88% 95%  Weight:      Height:        Intake/Output Summary (Last 24 hours) at 03/14/2018 1502 Last data filed at 03/14/2018 0900 Gross per 24 hour  Intake -  Output 1200 ml  Net -1200 ml   Filed Weights   02/09/2018 1437 03/07/18 1500 03/09/18 1050  Weight: 104.3 kg (230 lb) 112.3 kg (247 lb 9.2 oz) 113.5 kg (250 lb 3.6 oz)    Exam:  . General: 72 y.o. year-old female well-developed well-nourished tube.  Appears mildly uncomfortable due to cough and dyspnea after choking on bacon brought in by her son this morning.   . Cardiovascular: Regular rate and rhythm with no rubs or gallops.  No JVD or thyromegaly noted.  Respiratory: Diffuse rales bilaterally with mild wheezes noted diffusely.  Poor inspiratory effort.   . Abdomen: Soft nontender nondistended with normal bowel sounds x4 quadrants. . Musculoskeletal: No lower extremity edema.  Psychiatry: Mood is irritable.   Data Reviewed: CBC: Recent Labs  Lab 03/10/18 0417 03/11/18 0456 03/12/18 0610 03/13/18 0848 03/14/18 0438  WBC 9.3 10.9* 19.2* 17.1* 13.8*  NEUTROABS  --   --   --   --  13.4*  HGB 8.7* 9.0* 9.5* 8.6* 8.5*  HCT 29.2* 29.5* 30.8* 28.5* 27.8*  MCV 97.7 98.7 97.8 99.0 97.9  PLT 255 260 287 271 425   Basic Metabolic Panel: Recent Labs  Lab 03/09/18 0500 03/10/18 0417 03/11/18 0456 03/12/18 0610 03/14/18 0438  NA 135 135 136 136 129*  K 3.8 3.9 4.0 4.3 5.1  CL 88* 87* 86* 87* 85*  CO2 41* 41* 40* 38* 33*  GLUCOSE 136* 90 110* 119* 144*  BUN 13 12 11 12  29*  CREATININE 0.62 0.61 0.54 0.67 1.21*  CALCIUM 8.1* 7.8* 8.1* 8.4* 8.2*  MG 1.6* 1.5* 1.9  --   --    GFR: Estimated  Creatinine Clearance: 56.3 mL/min (A) (by C-G formula based on SCr of 1.21 mg/dL (H)). Liver Function Tests: Recent Labs  Lab 03/11/18 0456 03/14/18 0438  AST 13* 15  ALT 14 18  ALKPHOS 67 74  BILITOT 0.6 0.5  PROT 5.8* 5.7*  ALBUMIN 2.5* 2.4*   No results for input(s): LIPASE, AMYLASE in the last 168 hours. No results for input(s): AMMONIA in the last 168 hours. Coagulation Profile: No results for input(s): INR, PROTIME in the last 168 hours. Cardiac Enzymes: Recent Labs  Lab 03/07/18 1630 03/07/18 2136 03/12/18 0610 03/12/18 1251 03/12/18 1755  TROPONINI 0.04* 0.05* 0.03* 0.03* 0.03*   BNP (last 3 results) No results for input(s): PROBNP in the last 8760 hours. HbA1C: No results for input(s): HGBA1C in the last 72 hours. CBG: No results for input(s): GLUCAP in the last 168 hours. Lipid Profile: No results for input(s): CHOL, HDL, LDLCALC, TRIG, CHOLHDL, LDLDIRECT in the last 72 hours. Thyroid Function Tests: No results for input(s): TSH, T4TOTAL, FREET4, T3FREE, THYROIDAB in the last 72 hours. Anemia Panel: No results for input(s): VITAMINB12,  FOLATE, FERRITIN, TIBC, IRON, RETICCTPCT in the last 72 hours. Urine analysis:    Component Value Date/Time   COLORURINE YELLOW 02/26/2018 1627   APPEARANCEUR CLEAR 02/26/2018 1627   LABSPEC 1.018 02/26/2018 1627   PHURINE 5.0 02/26/2018 1627   GLUCOSEU NEGATIVE 02/26/2018 1627   HGBUR NEGATIVE 02/26/2018 1627   BILIRUBINUR NEGATIVE 02/26/2018 1627   KETONESUR NEGATIVE 02/26/2018 1627   PROTEINUR NEGATIVE 02/26/2018 1627   NITRITE NEGATIVE 02/26/2018 1627   LEUKOCYTESUR NEGATIVE 02/26/2018 1627   Sepsis Labs: @LABRCNTIP (procalcitonin:4,lacticidven:4)  ) Recent Results (from the past 240 hour(s))  Culture, blood (routine x 2)     Status: None (Preliminary result)   Collection Time: 03/12/18  7:50 AM  Result Value Ref Range Status   Specimen Description   Final    BLOOD LEFT HAND Performed at [facility], [place] [address] [place], [place] [address]    Special Requests   Final    BOTTLES DRAWN AEROBIC ONLY Blood Culture results may not be optimal due to an inadequate volume of blood received in culture bottles Performed at [facility] [address], [place], [place] [address]    Culture   Final    NO GROWTH 2 DAYS Performed at [facility] [address], [place], [place] [address]    Report Status PENDING  Incomplete  Culture, blood (routine x 2)     Status: None (Preliminary result)   Collection Time: 03/12/18  7:50 AM  Result Value Ref Range Status   Specimen Description   Final    BLOOD RIGHT HAND Performed at [facility] [address] [place], [place] [address]    Special Requests   Final    BOTTLES DRAWN AEROBIC ONLY Blood Culture results may not be optimal due to an inadequate volume of blood received in culture bottles Performed at [facility] [address] [place], [place] [address]    Culture   Final    NO GROWTH 2 DAYS Performed at [facility] [address], [place], [place] [address]    Report Status PENDING  Incomplete  MRSA PCR Screening     Status: None   Collection Time: 03/12/18  6:21 PM  Result Value Ref Range Status   MRSA by PCR NEGATIVE NEGATIVE Final    Comment:        The GeneXpert MRSA Assay (FDA approved for NASAL specimens only), is one component of a comprehensive MRSA colonization surveillance program. It is not intended to diagnose MRSA infection nor to guide or monitor treatment for MRSA infections. Performed at [facility], [place] [address], [place], [place] [address]       Studies: No results found.  Scheduled Meds: . amLODipine  5 mg Oral Daily  . aspirin  324 mg Oral Daily  . dextromethorphan-guaiFENesin  1 tablet Oral BID  . furosemide  60 mg Intravenous Once  . furosemide  40 mg Oral Daily  . levalbuterol  0.63 mg  Nebulization TID  . methylPREDNISolone (SOLU-MEDROL) injection  80 mg Intravenous Q6H  . metoprolol succinate  200 mg Oral Daily  . mometasone-formoterol  2 puff Inhalation BID  . pantoprazole  40 mg Oral BID  . protein supplement shake  11 oz Oral Q24H  . sodium chloride flush  10-40 mL Intracatheter Q12H  . sodium chloride flush  10-40 mL Intracatheter Q12H  . umeclidinium-vilanterol  1 puff Inhalation Daily    Continuous  Infusions: . ceFEPime (MAXIPIME) IV Stopped (03/14/18 0913)  . diltiazem (CARDIZEM) infusion 10 mg/hr (03/14/18 1358)     LOS: 18 days     Kayleen Memos, MD [facility] Pager [phone]  If 7PM-7AM, please contact night-coverage [email or web address] Password [facility] 03/14/2018, 3:02 PM

## 2018-03-15 ENCOUNTER — Inpatient Hospital Stay (HOSPITAL_COMMUNITY): Payer: Medicare Other

## 2018-03-15 LAB — BASIC METABOLIC PANEL
ANION GAP: 8 (ref 5–15)
BUN: 30 mg/dL — ABNORMAL HIGH (ref 6–20)
CALCIUM: 8.6 mg/dL — AB (ref 8.9–10.3)
CO2: 37 mmol/L — ABNORMAL HIGH (ref 22–32)
Chloride: 88 mmol/L — ABNORMAL LOW (ref 101–111)
Creatinine, Ser: 1.16 mg/dL — ABNORMAL HIGH (ref 0.44–1.00)
GFR calc Af Amer: 54 mL/min — ABNORMAL LOW (ref >60)
GFR, EST NON AFRICAN AMERICAN: 47 mL/min — AB (ref >60)
GLUCOSE: 152 mg/dL — AB (ref 65–99)
Potassium: 4.6 mmol/L (ref 3.5–5.1)
Sodium: 133 mmol/L — ABNORMAL LOW (ref 135–145)

## 2018-03-15 LAB — GLUCOSE, CAPILLARY: Glucose-Capillary: 296 mg/dL — ABNORMAL HIGH (ref 65–99)

## 2018-03-15 LAB — CBC
HCT: 32.1 % — ABNORMAL LOW (ref 36.0–46.0)
Hemoglobin: 9.8 g/dL — ABNORMAL LOW (ref 12.0–15.0)
MCH: 29.1 pg (ref 26.0–34.0)
MCHC: 30.5 g/dL (ref 30.0–36.0)
MCV: 95.3 fL (ref 78.0–100.0)
PLATELETS: 248 10*3/uL (ref 150–400)
RBC: 3.37 MIL/uL — ABNORMAL LOW (ref 3.87–5.11)
RDW: 18.3 % — AB (ref 11.5–15.5)
WBC: 8.9 10*3/uL (ref 4.0–10.5)

## 2018-03-15 NOTE — Progress Notes (Signed)
Patient began coughing and spitting up while eating dinner. MD notified. New orders received to make patient NPO.  Barbee Shropshire. Brigitte Pulse, RN

## 2018-03-15 NOTE — Significant Event (Signed)
Rapid Response Event Note  Overview: Time Called: 2007 Arrival Time: 2010 Event Type: Respiratory  Initial Focused Assessment: Arrived to find 72 yo Female lying on bed in obvious respiratory distress.  Pt obtunded and unresponsive, head being supported by NT, staff advised pt had just vomited and had been given ativan.  Monitor showed afib rate 100, BP 190/94, SPO2 24 with appropriate pleth, blood sugar 297.  NRB applied, contacted provider for further guidance--was initially advised pt was a DNR with drugs only.  Pt had been using bipap, RT advised issues with pt keeping bipap on previous nights and other episodes of hypoxia.  Bipap applied, TRH NP arrived, spoke with family who wanted to continue with bipap and move to SDU.   Interventions: above  Plan of Care (if not transferred): transferred to [place]  Event Summary:   at         [facility]

## 2018-03-15 NOTE — Progress Notes (Signed)
Responding to portable conts pulse ox alarm, patient found vomiting, coughing but very weak cough. Very drowsy and congested, difficulty to arouse. Oxygen dropping, RRT called.

## 2018-03-15 NOTE — Progress Notes (Signed)
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  03/11/2018: Patient seen and examined at her bedside.  She reports persistent productive cough.  Later this morning RN reports complaints of mild chest tightness.  Chest x-ray repeated, personally reviewed and revealed worsening infiltrates on the right lower mid and upper lungs as well as a right pleural effusion.  CT angio aorta ordered to further evaluate thoracic aorta dissection as well as right thoracentesis.   03/12/2018: Patient seen and examined at bedside.  She has no new complaints.  She wore her CPAP this morning.  Unable to obtain right thoracentesis yesterday due to not enough fluid in the pleural space.  03/13/18: Seen and examined at bedside. Persistent dyspnea at rest. Poor prognosis. Contacted oncology and palliative care team for goals of care.  03/14/18: Patient seen and examined at her bedside.  Suspect aspiration event this morning.  Patient has family, son at bedside who is giving food to the patient resulting in choking.  Educated on compliance.  03/15/2018: Patient seen and examined at bedside no new complaints.  Possible radiation on Monday.    Assessment/Plan: Principal Problem:   Acute on chronic respiratory failure with hypercapnia (HCC) Active Problems:   Obesity   COPD (chronic obstructive pulmonary disease) (HCC)   Essential hypertension   Squamous cell lung cancer, right (HCC)   Chronic diastolic heart failure (HCC)   OSA (obstructive sleep  apnea)   Iron deficiency anemia   Anxiety disorder   Goals of care, counseling/discussion   COPD with acute exacerbation (HCC)   Pleural effusion   Community acquired pneumonia   Community acquired pneumonia of right lower lobe of lung ([place])   AKI (acute kidney injury) ([place])   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 Highly appreciate recommendations.  Sepsis secondary to unclear source Suspect aspiration C/w IV cefepime States zosyn gives her a rash Continue to monitor fever curve Repeat CBC in the morning  Right lower lobe squamous cell carcinoma worsening with possible obstruction of right lung Might benefit from radiation therapy per oncology. Possible radiation on monday  RUE edema/tenderness Duplex US negative for DVT  Thoracic aortic dissection CTA revealed rapid increase of aneurysm from 4.02 4.7 cm with high suspicion for dissection Repeat CTA chest aorta today Strict blood pressure control  OSA/obesity hypoventilation syndrome Overnight BiPAP  Chronic A. fib with RVR On metoprolol po daily C/w Iv cardizem and titrate as indicated On aspirin 324 mg daily Last echo done on 02/28/2018 revealed normal EF with no wall motion abnormality  Goals of care Poor prognosis Palliative care consulted DNR  Medical non compliance Needs reinforcement Intermittently declines BIPAP  Generalized weakness/physical debility Continue PT Out of bed to chair as  tolerated Fall precautions PT recommend SNF CSW consulted for  placement    Code Status: DNR  Family Communication: None at bedside  Disposition Plan: SNF when clinically stable   Consultants:  Palliative care  Oncology  PCCM  Interventional radiology  CSW  Procedures:  right IJ Port-A-Cath placement  Antimicrobials:  Completed IV antibiotics IV ceftriaxone and IV azithromycin  DVT prophylaxis: SCDs Not on chemical DVT prophylaxis due to known aortic dissection   Objective: Vitals:   03/15/18 0538 03/15/18 0556 03/15/18 1044 03/15/18 1455  BP:  (!) 141/63 (!) 141/81 122/70  Pulse:  80 97 (!) 122  Resp:  20 20 18   Temp:  98 F (36.7 C) 97.9 F (36.6 C) 98.1 F (36.7 C)  TempSrc:  Oral Axillary   SpO2: 97%  92% 95%  Weight:      Height:        Intake/Output Summary (Last 24 hours) at 03/15/2018 1511 Last data filed at 03/15/2018 0859 Gross per 24 hour  Intake 676 ml  Output 403 ml  Net 273 ml   Filed Weights   02/23/2018 1437 03/07/18 1500 03/09/18 1050  Weight: 104.3 kg (230 lb) 112.3 kg (247 lb 9.2 oz) 113.5 kg (250 lb 3.6 oz)    Exam:  . General: 72 y.o. year-old female obese in no acute distress.  Alert and oriented x3. . Cardiovascular: Regular rate and rhythm with no rubs or gallops.  No JVD or thyromegaly noted.  Respiratory: Diffuse rales bilaterally with mild wheezes noted diffusely.  Poor inspiratory effort.   . Abdomen: Soft nontender nondistended with normal bowel sounds x4 quadrants. . Musculoskeletal: No lower extremity edema.  Psychiatry: Mood is irritable.   Data Reviewed: CBC: Recent Labs  Lab 03/11/18 0456 03/12/18 0610 03/13/18 0848 03/14/18 0438 03/15/18 0348  WBC 10.9* 19.2* 17.1* 13.8* 8.9  NEUTROABS  --   --   --  13.4*  --   HGB 9.0* 9.5* 8.6* 8.5* 9.8*  HCT 29.5* 30.8* 28.5* 27.8* 32.1*  MCV 98.7 97.8 99.0 97.9 95.3  PLT 260 287 271 253 751   Basic Metabolic Panel: Recent Labs  Lab 03/09/18 0500 03/10/18 0417 03/11/18 0456 03/12/18 0610 03/14/18 0438 03/15/18 0348   NA 135 135 136 136 129* 133*  K 3.8 3.9 4.0 4.3 5.1 4.6  CL 88* 87* 86* 87* 85* 88*  CO2 41* 41* 40* 38* 33* 37*  GLUCOSE 136* 90 110* 119* 144* 152*  BUN 13 12 11 12  29* 30*  CREATININE 0.62 0.61 0.54 0.67 1.21* 1.16*  CALCIUM 8.1* 7.8* 8.1* 8.4* 8.2* 8.6*  MG 1.6* 1.5* 1.9  --   --   --    GFR: Estimated Creatinine Clearance: 58.7 mL/min (A) (by C-G formula based on SCr of 1.16 mg/dL (H)). Liver Function Tests: Recent Labs  Lab 03/11/18 0456 03/14/18 0438  AST 13* 15  ALT 14 18  ALKPHOS 67 74  BILITOT 0.6 0.5  PROT 5.8* 5.7*  ALBUMIN 2.5* 2.4*   No results for input(s): LIPASE, AMYLASE in the last 168 hours. No results for input(s): AMMONIA in the last 168 hours. Coagulation Profile: No results for input(s): INR, PROTIME in the last 168 hours. Cardiac Enzymes: Recent Labs  Lab 03/12/18 0610 03/12/18 1251 03/12/18 1755  TROPONINI 0.03* 0.03* 0.03*   BNP (last 3 results) No results for input(s): PROBNP in the last 8760 hours. HbA1C: No results for input(s): HGBA1C in the last 72 hours. CBG: No results for input(s): GLUCAP in  the last 168 hours. Lipid Profile: No results for input(s): CHOL, HDL, LDLCALC, TRIG, CHOLHDL, LDLDIRECT in the last 72 hours. Thyroid Function Tests: No results for input(s): TSH, T4TOTAL, FREET4, T3FREE, THYROIDAB in the last 72 hours. Anemia Panel: No results for input(s): VITAMINB12, FOLATE, FERRITIN, TIBC, IRON, RETICCTPCT in the last 72 hours. Urine analysis:    Component Value Date/Time   COLORURINE YELLOW 02/26/2018 1627   APPEARANCEUR CLEAR 02/26/2018 1627   LABSPEC 1.018 02/26/2018 1627   PHURINE 5.0 02/26/2018 1627   GLUCOSEU NEGATIVE 02/26/2018 1627   HGBUR NEGATIVE 02/26/2018 1627   BILIRUBINUR NEGATIVE 02/26/2018 1627   KETONESUR NEGATIVE 02/26/2018 1627   PROTEINUR NEGATIVE 02/26/2018 1627   NITRITE NEGATIVE 02/26/2018 1627   LEUKOCYTESUR NEGATIVE 02/26/2018 1627   Sepsis  Labs: @LABRCNTIP (procalcitonin:4,lacticidven:4)  ) Recent Results (from the past 240 hour(s))  Culture, blood (routine x 2)     Status: None (Preliminary result)   Collection Time: 03/12/18  7:50 AM  Result Value Ref Range Status   Specimen Description   Final    BLOOD LEFT HAND Performed at [facility], [place] [address] [place], [place] [address]    Special Requests   Final    BOTTLES DRAWN AEROBIC ONLY Blood Culture results may not be optimal due to an inadequate volume of blood received in culture bottles Performed at [facility] [address], [place], [place] [address]    Culture   Final    NO GROWTH 3 DAYS Performed at [facility], [place] [address], [place], [place] [address]    Report Status PENDING  Incomplete  Culture, blood (routine x 2)     Status: None (Preliminary result)   Collection Time: 03/12/18  7:50 AM  Result Value Ref Range Status   Specimen Description   Final    BLOOD RIGHT HAND Performed at [facility] [address] [place], [place] [address]    Special Requests   Final    BOTTLES DRAWN AEROBIC ONLY Blood Culture results may not be optimal due to an inadequate volume of blood received in culture bottles Performed at [facility] [address], [place], [place] [address]    Culture   Final    NO GROWTH 3 DAYS Performed at [facility], [place] [address] [place], [place] [address]    Report Status PENDING  Incomplete  MRSA PCR Screening     Status: None   Collection Time: 03/12/18  6:21 PM  Result Value Ref Range Status   MRSA by PCR NEGATIVE NEGATIVE Final    Comment:        The GeneXpert MRSA Assay (FDA approved for NASAL specimens only), is one component of a comprehensive MRSA colonization surveillance program. It is not intended to diagnose MRSA infection nor to guide or monitor treatment for MRSA infections. Performed at [facility], [place] [address] [place],  [address]       Studies: No results found.  Scheduled Meds: . amLODipine  5 mg Oral Daily  . aspirin  324 mg Oral Daily  . dextromethorphan-guaiFENesin  1 tablet Oral BID  . furosemide  40 mg Oral Daily  . levalbuterol  0.63 mg Nebulization TID  . methylPREDNISolone (SOLU-MEDROL) injection  80 mg Intravenous Q6H  . metoprolol succinate  200 mg Oral Daily  . mometasone-formoterol  2 puff Inhalation BID  . pantoprazole  40 mg Oral BID  . protein supplement shake  11 oz Oral Q24H  . sodium chloride flush  10-40 mL Intracatheter Q12H  . sodium chloride flush  10-40 mL Intracatheter Q12H  . umeclidinium-vilanterol  1 puff Inhalation Daily    Continuous Infusions: . ceFEPime (MAXIPIME) IV Stopped (03/15/18 0950)  . diltiazem (CARDIZEM) infusion 10 mg/hr (03/15/18 1003)     LOS: 19 days     Kayleen Memos, MD [facility] Pager [phone]  If 7PM-7AM, please contact night-coverage [email or web address] Password [email or web address] 03/15/2018, 3:11 PM

## 2018-03-15 NOTE — Progress Notes (Signed)
Paged by Rapid Response regarding pt being hypoxic with 02 sat in the 40's. According to the bedside RN pt began to cough and then experienced some emesis which she subsequently aspirated. Upon arrival to the room pt was unresponsive with her oxygen 85-88% on Bipap. Bedside RN, Rapid Response RN, and respiratory were present. Pt is currently a DNR so she was transferred to stepdown and a stat CXR was obtained. Spoke with her son [name] explained what had occurred and discussed at length her poor prognosis. The decision was made to continue with the current level of care but not advance further. According to pharmacy the pt is already receiving coverage for aspiration pneumonia with Cefepime. We will also continue her on Bipap.  Arby Barrette AGPCNP-BC, AGNP-C [facility] Pager [phone]

## 2018-03-15 NOTE — Progress Notes (Signed)
During chart review saw two pauses documented by CCMD. One pause was documented at 2.12 sec and the other at 2.27 sec. RN re-measured pauses which measured 1.85 and 1.74. MD notified. Barbee Shropshire. Brigitte Pulse, RN

## 2018-03-15 NOTE — Progress Notes (Signed)
Called to pt's room at 2046 for pt in distress. Upon arrival RRT nurse in room. Pt presents with agonal breathing on NRB. BiPAP machine immediately obtained and placed on patient. MD arrived to room and ordered transfer to ICU.  Pt transferred to [id number]. Unit RT notified and given report about patient.

## 2018-03-16 LAB — CBC
HEMATOCRIT: 38.4 % (ref 36.0–46.0)
Hemoglobin: 11.2 g/dL — ABNORMAL LOW (ref 12.0–15.0)
MCH: 29.6 pg (ref 26.0–34.0)
MCHC: 29.2 g/dL — ABNORMAL LOW (ref 30.0–36.0)
MCV: 101.6 fL — ABNORMAL HIGH (ref 78.0–100.0)
PLATELETS: 355 10*3/uL (ref 150–400)
RBC: 3.78 MIL/uL — AB (ref 3.87–5.11)
RDW: 17.6 % — AB (ref 11.5–15.5)
WBC: 30.8 10*3/uL — AB (ref 4.0–10.5)

## 2018-03-16 LAB — BLOOD GAS, ARTERIAL
Delivery systems: POSITIVE
Drawn by: 257881
Expiratory PAP: 8
FIO2: 100
INSPIRATORY PAP: 20
LHR: 22 {breaths}/min
Mode: POSITIVE
O2 SAT: 91.5 %
PATIENT TEMPERATURE: 98.6
PH ART: 6.891 — AB (ref 7.350–7.450)
pO2, Arterial: 79.7 mmHg — ABNORMAL LOW (ref 83.0–108.0)

## 2018-03-16 LAB — BASIC METABOLIC PANEL
ANION GAP: 7 (ref 5–15)
BUN: 42 mg/dL — ABNORMAL HIGH (ref 6–20)
CALCIUM: 8.7 mg/dL — AB (ref 8.9–10.3)
CO2: 38 mmol/L — AB (ref 22–32)
Chloride: 92 mmol/L — ABNORMAL LOW (ref 101–111)
Creatinine, Ser: 1.69 mg/dL — ABNORMAL HIGH (ref 0.44–1.00)
GFR, EST AFRICAN AMERICAN: 34 mL/min — AB (ref >60)
GFR, EST NON AFRICAN AMERICAN: 30 mL/min — AB (ref >60)
Glucose, Bld: 106 mg/dL — ABNORMAL HIGH (ref 65–99)
Potassium: 5.8 mmol/L — ABNORMAL HIGH (ref 3.5–5.1)
Sodium: 137 mmol/L (ref 135–145)

## 2018-03-16 LAB — PROCALCITONIN: Procalcitonin: 5.74 ng/mL

## 2018-03-16 LAB — LACTIC ACID, PLASMA: Lactic Acid, Venous: 0.7 mmol/L (ref 0.5–1.9)

## 2018-03-16 MED ORDER — LORAZEPAM 2 MG/ML IJ SOLN: 1.0000 mg | INTRAMUSCULAR | Status: DC | PRN

## 2018-03-16 MED ORDER — SCOPOLAMINE 1 MG/3DAYS TD PT72
1.0000 | MEDICATED_PATCH | TRANSDERMAL | Status: DC
  Administered 2018-03-16: 1.5 mg via TRANSDERMAL
  Filled 2018-03-16: qty 1

## 2018-03-16 MED ORDER — SODIUM CHLORIDE 0.9 % IV BOLUS
250.0000 mL | Freq: Once | INTRAVENOUS | Status: AC
  Administered 2018-03-16: 250 mL via INTRAVENOUS

## 2018-03-16 MED ORDER — SODIUM CHLORIDE 0.9 % IV SOLN
1.0000 g | Freq: Three times a day (TID) | INTRAVENOUS | Status: DC
  Administered 2018-03-16: 1 g via INTRAVENOUS
  Filled 2018-03-16: qty 1

## 2018-03-16 MED ORDER — MORPHINE SULFATE (PF) 2 MG/ML IV SOLN
1.0000 mg | INTRAVENOUS | Status: DC | PRN
  Administered 2018-03-16 (×4): 1 mg via INTRAVENOUS
  Filled 2018-03-16 (×4): qty 1

## 2018-03-17 ENCOUNTER — Ambulatory Visit: Payer: Medicare Other | Admitting: Radiation Oncology

## 2018-03-17 LAB — CULTURE, BLOOD (ROUTINE X 2)
Culture: NO GROWTH
Culture: NO GROWTH

## 2018-03-17 LAB — TYPE AND SCREEN
ABO/RH(D): A POS
Antibody Screen: NEGATIVE
Unit division: 0
Unit division: 0

## 2018-03-17 LAB — BPAM RBC
Blood Product Expiration Date: 201907022359
Blood Product Expiration Date: 201907022359
ISSUE DATE / TIME: 201906141211
ISSUE DATE / TIME: 201906141717
Unit Type and Rh: 6200
Unit Type and Rh: 6200

## 2018-03-18 ENCOUNTER — Ambulatory Visit: Payer: Medicare Other

## 2018-03-19 ENCOUNTER — Ambulatory Visit: Payer: Medicare Other

## 2018-03-19 ENCOUNTER — Encounter: Payer: Self-pay | Admitting: Radiation Oncology

## 2018-03-20 ENCOUNTER — Ambulatory Visit: Payer: Medicare Other

## 2018-03-21 ENCOUNTER — Ambulatory Visit: Payer: Medicare Other

## 2018-03-24 ENCOUNTER — Ambulatory Visit: Payer: Medicare Other

## 2018-03-25 ENCOUNTER — Ambulatory Visit: Payer: Medicare Other

## 2018-03-25 NOTE — Progress Notes (Signed)
  Radiation Oncology         [phone] ________________________________  Name: Alisha Fisher MRN: [id number]  Date: 03/14/2018  DOB: 03-01-1946  SIMULATION AND TREATMENT PLANNING NOTE  DIAGNOSIS:     ICD-10-CM   1. 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  Identity was confirmed.  All relevant records and images related to the planned course of therapy were reviewed.   Written consent to proceed with treatment was confirmed which was freely given after reviewing the details related to the planned course of therapy had been reviewed with the patient.  Then, the patient was set-up in a stable reproducible  supine position for radiation therapy.  CT images were obtained.  Surface markings were placed.     The CT images were loaded into the planning software.  Then the target and avoidance structures were contoured.  Treatment planning then occurred.  The radiation prescription was entered and confirmed.  A total of 3 complex treatment devices were fabricated which relate to the designed radiation treatment fields. Each of these customized fields/ complex treatment devices will be used on a daily basis during the radiation course. I have requested : 3D Simulation  I have requested a DVH of the following structures: target, lungs, cord.   The patient will undergo daily image guidance to ensure accurate localization of the target, and adequate minimize dose to the normal surrounding structures in close proximity to the target.   PLAN:  The patient will receive 30 Gy in 10 fractions.  ________________________________   Jodelle Gross, MD, PhD

## 2018-03-26 ENCOUNTER — Ambulatory Visit: Payer: Medicare Other

## 2018-03-27 ENCOUNTER — Ambulatory Visit: Payer: Medicare Other

## 2018-03-28 ENCOUNTER — Ambulatory Visit: Payer: Medicare Other

## 2018-03-31 NOTE — Progress Notes (Signed)
Called to pt's bedside shortly after pt passed to give grief support to family. Pt's sister asked questions about next steps. She was not sure of which crematory they will use. She was given number for bed control in order to call and supply name when they have made that decision. Pt's family was appropriately tearful but also at peace. They expressed how pt is no longer struggling but at peace. Chaplain provided pastoral and comfort support until most family departed. Pt's son stay behind to wait for his daughter to arrive in 30 minutes. Staff was notified.  Silver Firs, MDiv   2018-04-06 1400  Clinical Encounter Type  Visited With Family

## 2018-03-31 NOTE — Progress Notes (Signed)
Patients family chose to make patient comfortable with MD. Patients family gathered to be with her. ECG monitor showed asystole at 1420, Sarah RN and Janett Billow RN verified no heart beat on auscultation. Patents family present at bedside at time of death. Chaplain called to bedside to be with family. MD paged to notify.

## 2018-03-31 NOTE — Progress Notes (Signed)
RT attempted the ABG 2 times and used the doppler. RT called another RT to attempt.

## 2018-03-31 NOTE — Progress Notes (Addendum)
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  03/11/2018: Patient seen and examined at her bedside.  She reports persistent productive cough.  Later this morning RN reports complaints of mild chest tightness.  Chest x-ray repeated, personally reviewed and revealed worsening infiltrates on the right lower mid and upper lungs as well as a right pleural effusion.  CT angio aorta ordered to further evaluate thoracic aorta dissection as well as right thoracentesis.   03/12/2018: Patient seen and examined at bedside.  She has no new complaints.  She wore her CPAP this morning.  Unable to obtain right thoracentesis yesterday due to not enough fluid in the pleural space.  03/13/18: Seen and examined at bedside. Persistent dyspnea at rest. Poor prognosis. Contacted oncology and palliative care team for goals of care.  03/14/18: Patient seen and examined at her bedside.  Suspect aspiration event this morning.  Patient has family, son at bedside who is giving food to the patient resulting in choking.  Educated on compliance.  03/15/2018: Patient seen and examined at bedside no new complaints.  Possible radiation on Monday.  04-08-2018: Patient seen and examined at bedside.  She is on BiPAP and unresponsive.  Had an event overnight in which she aspirated and was found unresponsive with O2 saturation in the 40s.  Transferred to the ICU overnight.  Called her 2 sons and sister [name] to inform of current situation.  Family stated they will be here  in 30 minutes.  She is on BiPAP with settings 20/8 with FiO2 of 100% and O2 saturation 90%.  ABG ordered stat.    Chest x-ray done overnight personally reviewed and revealed bilateral infiltrates worse on the right.  While in the room blood pressure 94/48 with map of 62 ordered 250 cc NS bolus to be administered stat.  UPDATE: Family members which include the patient's 2 sons and sister made the patient comfort measures only today 04/08/18. All means of treatment have been stopped. BIPAP kept on at family request until everyone says goodbye. Palliative care consulted to assist family.      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 yesterday 03/15/2018 Suspected aspiration overnight with rapid decompensation O2 saturation in the 40s IV antibiotics changed from IV cefepime to IV meropenem-stopped due to CMO Currently on  BiPAP with setting 20/8 FiO2 100% saturation in the low 90s-family requests to keep on Continue IV Solu-Medrol-stopped due to CMO Obtain ABG. PH 6.8 on BIPAP. Patient is now CMO. Very poor prognosis. Continue to closely monitor in the ICU  Severe  respiratory acidosis 2/2 to advanced lung cancer complicated by aspiration PH 6.8 on non invasive positive airway pressure BIPAP 20/8 FiO2 100% CMO-very poor prognosis Might not survive this hospitalization Continue BIPAP at patient's family request  Severe sepsis secondary to recurrent aspiration  States zosyn gives her a rash Currently on IV meropenem for recurrent aspiration Monitor fever curve Obtain CBC, procalcitonin, lactic acid Now CMO  Right lower lobe squamous cell carcinoma worsening with possible obstruction of right lung Might benefit from radiation therapy per oncology. Planned radiation on Monday, 03/17/2018 Followed by oncology  RUE edema/tenderness Duplex US negative for DVT  Thoracic aortic dissection CTA revealed rapid increase of aneurysm from 4.02 4.7 cm with high suspicion for dissection Repeat CTA chest aorta today Strict blood pressure control  OSA/obesity hypoventilation syndrome Overnight BiPAP Noncompliant with BiPAP  Non compliance with medical management Refuses BiPAP Noncompliant with diet and further testing to assess for dysplasia Family providing food against medical advice  Chronic A. fib with RVR On metoprolol po daily C/w Iv cardizem and titrate as indicated On aspirin 324 mg daily Last echo done on 02/28/2018 revealed normal EF with no wall motion abnormality  Goals of care Poor prognosis Palliative care consulted DNR -NOW CMO (03-29-2018)  Generalized weakness/physical debility Continue PT Out of bed to chair as tolerated Fall precautions PT recommend SNF CSW consulted for placement CMO-Very Poor prognosis  Comfort measures only IV ativan 1mg  q1h prn for anxiety IV morphine 1mg  q1h prn for air hunger scopolamine patch for secretions Palliative care consulted stat to assist family Provide preacher counseling if requested    Code Status: DNR- Comfort measures only.  Family Communication: Sister, 2 sons in person. All  request CMO.  Disposition Plan: Very poor prognosis.  May not survive this hospitalization.  Consultants:  Palliative care  Oncology  PCCM  Interventional radiology  CSW  Procedures:  right IJ Port-A-Cath placement  Antimicrobials:  IV meropenem-stopped due to CMO  DVT prophylaxis: SCDs Not on chemical DVT prophylaxis due to known aortic dissection   Objective: Vitals:   03/15/18 2100 03/15/18 2134 2018/03/29 0000 March 29, 2018 0400  BP: (!) 197/74  (!) 174/73 (!) 122/51  Pulse: (!) 145  91 61  Resp: (!) 22  11 (!) 9  Temp:   (!) 95.9 F (35.5 C) (!) 96.2 F (35.7 C)  TempSrc:   Axillary Axillary  SpO2: 97% 98% 91% 94%  Weight:      Height:        Intake/Output Summary (Last 24 hours) at 03/29/18 0803 Last data filed at 03/15/2018 2300 Gross per 24 hour  Intake 782.04 ml  Output 3 ml  Net 779.04 ml   Filed Weights   02/25/2018 1437 03/07/18 1500 03/09/18 1050  Weight: 104.3 kg (230 lb) 112.3 kg (247 lb 9.2 oz) 113.5 kg (250 lb 3.6 oz)    Exam:  . General: 72 y.o. year-old female obese female not responsive to painful stimuli on BiPAP  . cardiovascular: Irregular rate and rhythm with no rubs or gallops.  Respiratory: Diffuse rales bilaterally but poor inspiratory effort . Abdomen: obese with hypoactive bowel sounds . Musculoskeletal: Upper extremity edema bilaterally . Psychiatry: Unable to assess due to nonresponsiveness   Data Reviewed: CBC: Recent  Labs  Lab 03/11/18 0456 03/12/18 0610 03/13/18 0848 03/14/18 0438 03/15/18 0348  WBC 10.9* 19.2* 17.1* 13.8* 8.9  NEUTROABS  --   --   --  13.4*  --   HGB 9.0* 9.5* 8.6* 8.5* 9.8*  HCT 29.5* 30.8* 28.5* 27.8* 32.1*  MCV 98.7 97.8 99.0 97.9 95.3  PLT 260 287 271 253 154   Basic Metabolic Panel: Recent Labs  Lab 03/10/18 0417 03/11/18 0456 03/12/18 0610 03/14/18 0438 03/15/18 0348  NA 135 136 136 129* 133*  K 3.9 4.0 4.3 5.1 4.6  CL 87* 86* 87* 85* 88*  CO2 41* 40* 38* 33* 37*  GLUCOSE 90  110* 119* 144* 152*  BUN 12 11 12  29* 30*  CREATININE 0.61 0.54 0.67 1.21* 1.16*  CALCIUM 7.8* 8.1* 8.4* 8.2* 8.6*  MG 1.5* 1.9  --   --   --    GFR: Estimated Creatinine Clearance: 58.7 mL/min (A) (by C-G formula based on SCr of 1.16 mg/dL (H)). Liver Function Tests: Recent Labs  Lab 03/11/18 0456 03/14/18 0438  AST 13* 15  ALT 14 18  ALKPHOS 67 74  BILITOT 0.6 0.5  PROT 5.8* 5.7*  ALBUMIN 2.5* 2.4*   No results for input(s): LIPASE, AMYLASE in the last 168 hours. No results for input(s): AMMONIA in the last 168 hours. Coagulation Profile: No results for input(s): INR, PROTIME in the last 168 hours. Cardiac Enzymes: Recent Labs  Lab 03/12/18 0610 03/12/18 1251 03/12/18 1755  TROPONINI 0.03* 0.03* 0.03*   BNP (last 3 results) No results for input(s): PROBNP in the last 8760 hours. HbA1C: No results for input(s): HGBA1C in the last 72 hours. CBG: Recent Labs  Lab 03/15/18 2012  GLUCAP 296*   Lipid Profile: No results for input(s): CHOL, HDL, LDLCALC, TRIG, CHOLHDL, LDLDIRECT in the last 72 hours. Thyroid Function Tests: No results for input(s): TSH, T4TOTAL, FREET4, T3FREE, THYROIDAB in the last 72 hours. Anemia Panel: No results for input(s): VITAMINB12, FOLATE, FERRITIN, TIBC, IRON, RETICCTPCT in the last 72 hours. Urine analysis:    Component Value Date/Time   COLORURINE YELLOW 02/26/2018 1627   APPEARANCEUR CLEAR 02/26/2018 1627   LABSPEC 1.018 02/26/2018 1627   PHURINE 5.0 02/26/2018 1627   GLUCOSEU NEGATIVE 02/26/2018 1627   HGBUR NEGATIVE 02/26/2018 1627   BILIRUBINUR NEGATIVE 02/26/2018 1627   KETONESUR NEGATIVE 02/26/2018 1627   PROTEINUR NEGATIVE 02/26/2018 1627   NITRITE NEGATIVE 02/26/2018 1627   LEUKOCYTESUR NEGATIVE 02/26/2018 1627   Sepsis Labs: @LABRCNTIP (procalcitonin:4,lacticidven:4)  ) Recent Results (from the past 240 hour(s))  Culture, blood (routine x 2)     Status: None (Preliminary result)   Collection Time: 03/12/18  7:50 AM   Result Value Ref Range Status   Specimen Description   Final    BLOOD LEFT HAND Performed at [facility], [place] [address], [place], [place] [address]    Special Requests   Final    BOTTLES DRAWN AEROBIC ONLY Blood Culture results may not be optimal due to an inadequate volume of blood received in culture bottles Performed at [facility] [address], [place], [place] [address]    Culture   Final    NO GROWTH 3 DAYS Performed at [facility], [place] [address], [place], [place] [address]    Report Status PENDING  Incomplete  Culture, blood (routine x 2)     Status: None (Preliminary result)   Collection Time: 03/12/18  7:50 AM  Result Value Ref Range  Status   Specimen Description   Final    BLOOD RIGHT HAND Performed at [facility] [address], [place], [place] [address]    Special Requests   Final    BOTTLES DRAWN AEROBIC ONLY Blood Culture results may not be optimal due to an inadequate volume of blood received in culture bottles Performed at [facility] [address], [place], [place] [address]    Culture   Final    NO GROWTH 3 DAYS Performed at [facility], [place] [address], [place], [place] [address]    Report Status PENDING  Incomplete  MRSA PCR Screening     Status: None   Collection Time: 03/12/18  6:21 PM  Result Value Ref Range Status   MRSA by PCR NEGATIVE NEGATIVE Final    Comment:        The GeneXpert MRSA Assay (FDA approved for NASAL specimens only), is one component of a comprehensive MRSA colonization surveillance program. It is not intended to diagnose MRSA infection nor to guide or monitor treatment for MRSA infections. Performed at [facility], [place] [address], [place], [place] [address]       Studies: Dg Chest Port 1 View  Result Date: 03/15/2018 CLINICAL DATA:  Aspiration pneumonia EXAM: PORTABLE CHEST 1 VIEW COMPARISON:  CT  03/11/2018, radiograph 03/11/2018 FINDINGS: Right-sided central venous port tip over the SVC. Cardiomegaly with vascular congestion. Small bilateral pleural effusions. Slightly improved aeration of the right thorax. Increasing airspace disease at the left lung base. IMPRESSION: 1. Slightly improved aeration of right thorax 2. Residual right pleural effusion or thickening and diffuse airspace disease. 3. Increasing airspace disease at the left base which may reflect atelectasis, pneumonia or aspiration 4. Stable enlarged cardiomediastinal silhouette Electronically Signed   By: Donavan Foil M.D.   On: 03/15/2018 21:41    Scheduled Meds: . amLODipine  5 mg Oral Daily  . aspirin  324 mg Oral Daily  . dextromethorphan-guaiFENesin  1 tablet Oral BID  . furosemide  40 mg Oral Daily  . levalbuterol  0.63 mg Nebulization TID  . methylPREDNISolone (SOLU-MEDROL) injection  80 mg Intravenous Q6H  . metoprolol succinate  200 mg Oral Daily  . mometasone-formoterol  2 puff Inhalation BID  . pantoprazole  40 mg Oral BID  . protein supplement shake  11 oz Oral Q24H  . sodium chloride flush  10-40 mL Intracatheter Q12H  . sodium chloride flush  10-40 mL Intracatheter Q12H  . umeclidinium-vilanterol  1 puff Inhalation Daily    Continuous Infusions: . diltiazem (CARDIZEM) infusion Stopped (03-23-18 0130)  . meropenem (MERREM) IV    . sodium chloride       LOS: 20 days     Kayleen Memos, MD [facility] Pager [phone]  If 7PM-7AM, please contact night-coverage [email or web address] Password [email or web address] 03/23/18, 8:03 AM

## 2018-03-31 NOTE — Death Summary Note (Signed)
Death Summary  Alisha Fisher ACZ:[id number] DOB: 08/27/1946 DOA: March 10, 2018  PCP: [name], MD  Admit date: Mar 10, 2018 Date of Death: 2018-03-30 Time of Death: 12-24-18 Notification: [name], MD notified of death of 30-Mar-2018   History of present illness:  Alisha Fisher is a 72 y.o. 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 fib not on anticoagulation, anxiety, who was admitted with acute hypoxic hypercarbic respiratory failure and found to have pulmonary edema, HCAP, aspiration pneumonia, for which she was treated with IV antibiotics IV vancomycin and IV cefepime.   Hospital course complicated by recurrent aspirations.  Speech therapist consulted and suspected esophageal dysmotility as largest source of aspiration risk.  Was thoroughly educated on aspiration precautions.  Patient declined modified barium swallow evaluation to rule out oropharyngeal deficits.   Also noted suspicion for Type A thoracic aortic dissection on CTA PE (02/27/18) CTA chest aorta repeated on 03/11/18 which showed stable appearance. No evidence of mediastinal hemorrhage.  While hospitalized was followed by oncology, Dr. Marin Olp, and was also seen by palliative care team, and radiation oncology, PA Bruning.    Worsening stage IV NSCLC, squamous cell carcinoma of the right lower lung causing obstruction.  On the evening of 03/15/2018 patient became severely hypoxic requiring noninvasive positive airway pressure BiPAP.  On the morning of 2018/03/30 patient's family, her sister and 2 sons chose to make patient comfortable.  All treatments stopped and comfort measures only protocol applied.  Time of death: 1093 on 2018/03/30.   Final  Diagnoses:  1.   Cardiopulmonary arrest 2.   Acute on chronic hypoxic hypercarbic respiratory failure 3.   Stage IV squamous cell carcinoma of the right lower lung causing obstruction of right lung 4.    Dysphagia  In addition:  - Severe respiratory acidosis 2/2 to advanced lung cancer complicated by aspiration/OSA/Obesity hypoventilation - Severe sepsis secondary to recurrent aspiration  - Suspected Type A Thoracic aortic dissection - OSA non compliant with CPAP - Non compliance with medical management - Chronic A. fib with RVR - Generalized weakness/physical debility - Chronic Anxiety   The results of significant diagnostics from this hospitalization (including imaging, microbiology, ancillary and laboratory) are listed below for reference.    Significant Diagnostic Studies: Ct Chest W Contrast  Result Date: 03/10/18 CLINICAL DATA:  Dyspnea. EXAM: CT CHEST WITH CONTRAST TECHNIQUE: Multidetector CT imaging of the chest was performed during intravenous contrast administration. CONTRAST:  72mL OMNIPAQUE IOHEXOL 300 MG/ML  SOLN COMPARISON:  PET scan of Feb 07, 2018.  CT scan of August 07, 2017. FINDINGS: Cardiovascular: Atherosclerosis of thoracic aorta is noted without aneurysm formation. Coronary artery calcifications are noted. No pericardial effusion is noted. Mediastinum/Nodes: Thyroid gland and esophagus are unremarkable. Subcarinal adenopathy measuring 3.5 cm is noted. 2.4 cm right paratracheal adenopathy is noted. 12 mm right axillary lymph node is noted. Lungs/Pleura: No pneumothorax is noted. Continued presence of large mass in right lower lobe consistent with malignancy. Pleural spread of tumor is also noted and unchanged. Radiation fibrosis is noted medially in right upper lobe. Stable scarring or inflammation is noted anteriorly in the left upper lobe. Upper Abdomen: No acute abnormality. Musculoskeletal: No chest wall abnormality. No acute or significant osseous findings.  IMPRESSION: Continued presence of large right lower lobe mass  with pleural spread is noted consistent with history of lung cancer. 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 is well. Stable scarring or inflammation is noted anteriorly in the left upper lobe. Right paratracheal and subcarinal and right axillary adenopathy is noted consistent with metastatic disease. Coronary artery calcifications are noted suggesting coronary artery disease. Aortic Atherosclerosis (ICD10-I70.0). Electronically Signed   By: Marijo Conception, M.D.   On: 01/31/2018 19:10   Ct Angio Chest Pe W Or Wo Contrast  Result Date: 02/27/2018 CLINICAL DATA:  Lung cancer, elevated D-dimer EXAM: CT ANGIOGRAPHY CHEST WITH CONTRAST TECHNIQUE: Multidetector CT imaging of the chest was performed using the standard protocol during bolus administration of intravenous contrast. Multiplanar CT image reconstructions and MIPs were obtained to evaluate the vascular anatomy. CONTRAST:  153mL ISOVUE-370 IOPAMIDOL (ISOVUE-370) INJECTION 76% COMPARISON:  CT chest dated 01/30/2018 FINDINGS: Cardiovascular: Satisfactory opacification the bilateral pulmonary arteries to the lobar level. No evidence of pulmonary embolism. 4.7 cm ascending thoracic aortic aneurysm, rapidly increasing from recent CT chest, when it measured 4.0 cm. Although unusual in appearance and suboptimally evaluated due to study technique, there is differential enhancement within the lumen, suggesting in ascending thoracic aortic dissection (series 6/image 44). This is favored to be a type A aortic dissection arising at the aortic root and terminating at the origin of the right brachiocephalic artery. Cardiomegaly.  No pericardial effusion. Atherosclerotic calcifications of the aortic arch. Three vessel coronary atherosclerosis. Enlargement of the main pulmonary artery, raising the possibility of pulmonary arterial hypertension. Mediastinum/Nodes: 13 mm short axis distinct subcarinal  node. 2.0 cm short axis azygoesophageal recess node. Visualized thyroid is unremarkable. Lungs/Pleura: Large right lower lobe mass, unchanged from recent CT, with lymphangitic and pleural spread in the right hemithorax. Radiation changes in the anterior left upper lobe. Patchy left lower lobe opacity, likely atelectasis. Small pleural effusion. Mild centrilobular and paraseptal emphysematous changes, upper lobe predominant. No pneumothorax. Upper Abdomen: Visualized upper abdomen is grossly unremarkable, noting a mildly nodular hepatic contour. Musculoskeletal: Visualized osseous structures are within normal limits. Review of the MIP images confirms the above findings. IMPRESSION: No evidence of pulmonary embolism. 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 Rapid enlargement of the ascending thoracic aorta, now measuring 4.7 cm, previously 4.0 cm on recent CT. Thoracic surgery consultation is suggested. Otherwise unchanged from recent CT and PET. Extensive tumor in the right lower lobe/hemithorax with pleural and nodal metastases, better evaluated on prior studies. Critical Value/emergent results were called by telephone at the time of interpretation on 02/27/2018 at 9:57 pm to Dr. Halford Chessman, who verbally acknowledged these results. Aortic Atherosclerosis (ICD10-I70.0) and Emphysema (ICD10-J43.9). Electronically Signed   By: Julian Hy M.D.   On: 02/27/2018 22:00   [place] Chest (pleural Effusion)  Result Date: 03/11/2018 CLINICAL DATA:  72 year old female with possible pleural effusion EXAM: CHEST ULTRASOUND COMPARISON:  CT 02/27/2018 FINDINGS: Limited ultrasound of the right chest demonstrates no pleural fluid. Thoracentesis not performed. IMPRESSION: No right-sided pleural fluid identified by ultrasound. Thoracentesis not performed. Electronically Signed   By:  Corrie Mckusick D.O.   On: 03/11/2018 14:55   Dg Esophagus  Result Date: 02/26/2018 CLINICAL DATA:  Dysphagia.  Metastatic lung cancer. EXAM: ESOPHOGRAM/BARIUM SWALLOW TECHNIQUE: Single contrast examination was performed using  thin barium. FLUOROSCOPY TIME:  Fluoroscopy Time:  42 seconds Radiation Exposure Index (if provided by the fluoroscopic  device): 26.1 mGy COMPARISON:  CT scan of the chest dated 02/05/2018 FINDINGS: There is a slight mass effect upon the right side of the esophagus just below the carina consistent with the adenopathy seen on chest x-ray. However, there is no stricture at that site. The patient has poor esophageal motility with a poor secondary stripping wave with to and fro peristalsis in the mid esophagus with intermittent tertiary contractions in the mid esophagus. There is a small hiatal hernia without a stricture. Note is made of a moderate right effusion as well as bibasilar lung consolidation, right greater than left. IMPRESSION: 1. Poor esophageal motility with to and fro peristalsis in the mid esophagus with occasional tertiary contractions in the mid esophagus. 2. Slight mass effect on the mid esophagus due to subcarinal adenopathy. However, this does not cause significant narrowing of the esophagus. 3. Small hiatal hernia without a stricture. Electronically Signed   By: Lorriane Shire M.D.   On: 02/26/2018 16:02   Ir US Guide Vasc Access Right  Result Date: 03/05/2018 CLINICAL DATA:  Metastatic lung cancer EXAM: RIGHT INTERNAL JUGULAR SINGLE LUMEN POWER PORT CATHETER INSERTION Date:  03/05/2018 03/05/2018 4:14 pm Radiologist:  Jerilynn Mages. Daryll Brod, MD Guidance:  Ultrasound fluoroscopic MEDICATIONS: Clindamycin 600 mg IV; The antibiotic was administered within an appropriate time interval prior to skin puncture. ANESTHESIA/SEDATION: Versed 1.5 mg IV; Fentanyl 75 mcg IV; Moderate Sedation Time:  31 minutes The patient was continuously monitored during the procedure by the interventional  radiology nurse under my direct supervision. FLUOROSCOPY TIME:  30 seconds (10 mGy) COMPLICATIONS: None immediate. CONTRAST:  None. PROCEDURE: Informed consent was obtained from the patient following explanation of the procedure, risks, benefits and alternatives. The patient understands, agrees and consents for the procedure. All questions were addressed. A time out was performed. Maximal barrier sterile technique utilized including caps, mask, sterile gowns, sterile gloves, large sterile drape, hand hygiene, and 2% chlorhexidine scrub. Under sterile conditions and local anesthesia, right internal jugular micropuncture venous access was performed. Access was performed with ultrasound. Images were obtained for documentation of the patent right internal jugular vein. A guide wire was inserted followed by a transitional dilator. This allowed insertion of a guide wire and catheter into the IVC. Measurements were obtained from the SVC / RA junction back to the right IJ venotomy site. In the right infraclavicular chest, a subcutaneous pocket was created over the second anterior rib. This was done under sterile conditions and local anesthesia. 1% lidocaine with epinephrine was utilized for this. A 2.5 cm incision was made in the skin. Blunt dissection was performed to create a subcutaneous pocket over the right pectoralis major muscle. The pocket was flushed with saline vigorously. There was adequate hemostasis. The port catheter was assembled and checked for leakage. The port catheter was secured in the pocket with two retention sutures. The tubing was tunneled subcutaneously to the right venotomy site and inserted into the SVC/RA junction through a valved peel-away sheath. Position was confirmed with fluoroscopy. Images were obtained for documentation. The patient tolerated the procedure well. No immediate complications. Incisions were closed in a two layer fashion with 4 - 0 Vicryl suture. Dermabond was applied to the  skin. The port catheter was accessed, blood was aspirated followed by saline and heparin flushes. Needle was removed. A dry sterile dressing was applied. IMPRESSION: Ultrasound and fluoroscopically guided right internal jugular single lumen power port catheter insertion. Tip in the SVC/RA junction. Catheter ready for use. Electronically Signed   By:  M.  Shick M.D.   On: 03/05/2018 16:23   Dg Chest Port 1 View  Result Date: 03/15/2018 CLINICAL DATA:  Aspiration pneumonia EXAM: PORTABLE CHEST 1 VIEW COMPARISON:  CT 03/11/2018, radiograph 03/11/2018 FINDINGS: Right-sided central venous port tip over the SVC. Cardiomegaly with vascular congestion. Small bilateral pleural effusions. Slightly improved aeration of the right thorax. Increasing airspace disease at the left lung base. IMPRESSION: 1. Slightly improved aeration of right thorax 2. Residual right pleural effusion or thickening and diffuse airspace disease. 3. Increasing airspace disease at the left base which may reflect atelectasis, pneumonia or aspiration 4. Stable enlarged cardiomediastinal silhouette Electronically Signed   By: Donavan Foil M.D.   On: 03/15/2018 21:41   Dg Chest Port 1 View  Result Date: 03/11/2018 CLINICAL DATA:  [place] EXAM: PORTABLE CHEST 1 VIEW COMPARISON:  03/06/2018 FINDINGS: Right Port-A-Cath remains in place, unchanged. Cardiomegaly with vascular congestion. Moderate, likely loculated right pleural effusion. Worsening aeration throughout the right lung with increasing diffuse right lung airspace disease. Increasing left upper lobe airspace opacity. IMPRESSION: Worsening airspace disease diffusely throughout the right lung and in the left upper lobe. Moderate, loculated right pleural effusion, slightly increased. Cardiomegaly, vascular congestion. Electronically Signed   By: Rolm Baptise M.D.   On: 03/11/2018 09:31   Dg Chest Port 1 View  Result Date: 03/06/2018 CLINICAL DATA:  Per order- acute respiratory failure Pt HX:  COPD, HTN, ex smoker EXAM: PORTABLE CHEST 1 VIEW COMPARISON:  03/04/2018 FINDINGS: Patient is a RIGHT-sided PowerPort catheter tip overlying the level of superior vena cava. The cardiac silhouette is enlarged and stable in configuration. There is persistent significant opacity in the RIGHT LOWER lobe and associated RIGHT pleural effusion. There is improved aeration in the LEFT LOWER lobe. IMPRESSION: 1. Persistent opacity in the RIGHT lung. 2. Improved aeration in the LEFT lung. 3. Stable cardiomegaly Electronically Signed   By: Nolon Nations M.D.   On: 03/06/2018 07:06   Dg Chest Port 1 View  Result Date: 03/04/2018 CLINICAL DATA:  Acute respiratory failure. History of COPD, former smoker, lung malignancy. EXAM: PORTABLE CHEST 1 VIEW COMPARISON:  Portable chest x-ray of March 01, 2018 FINDINGS: The lungs are adequately inflated. Patchy airspace opacity in the left perihilar region persists but is slightly less conspicuous today. There is stable volume loss on the right with pleural thickening or pleural fluid overlying the superior and lateral aspects of the right lung. Increased density at the right lung base persists consistent with a known mass. The cardiac silhouette is enlarged. The pulmonary vascularity is mildly prominent. There is calcification in the wall of the aortic arch. The observed bony thorax is unremarkable. IMPRESSION: Persistent airspace opacity in the left perihilar region likely reflects pneumonia. Persistent volume loss on the right with increased mid and lower right hemithorax density consistent with pleural fluid and a known mass. Persistent cardiomegaly with slightly increased pulmonary vascular prominence consistent with mild CHF. Electronically Signed   By: David  Martinique M.D.   On: 03/04/2018 07:20   Dg Chest Port 1 View  Result Date: 03/01/2018 CLINICAL DATA:  Acute respiratory failure. History of hypertension, lung cancer, COPD, PE, former smoker. EXAM: PORTABLE CHEST 1 VIEW  COMPARISON:  Chest x-rays dated 02/27/2018 and 02/11/2018. FINDINGS: Cardiomegaly is stable. Overall cardiomediastinal silhouette is stable. Atherosclerotic changes again noted at the aortic arch. Stable opacity at the RIGHT lung base. New platelike opacity within the LEFT perihilar lung, pneumonia versus atelectasis. No pneumothorax seen. IMPRESSION: 1. Stable cardiomegaly. 2. Persistent  opacity at the RIGHT lung base, compatible with the mass/consolidation demonstrated on recent chest CT of 02/27/2018. 3. New platelike opacity within the LEFT perihilar lung, pneumonia versus atelectasis, less likely asymmetric edema. Electronically Signed   By: Franki Cabot M.D.   On: 03/01/2018 07:37   Dg Chest Port 1 View  Result Date: 02/27/2018 CLINICAL DATA:  72 year old female with large right lower lung mass, lung cancer. Shortness of breath. EXAM: PORTABLE CHEST 1 VIEW COMPARISON:  Chest CT and radiographs 02/25/2018. FINDINGS: Portable AP semi upright view at 0433 hours. Continued dense right lower lung and retrocardiac opacity corresponding to the lung mass/drowned lung. Mildly increased pulmonary vascularity but no overt edema. Increased left lung base opacity most resembling atelectasis. No pneumothorax. No large pleural effusion. Stable cardiac size and mediastinal contours. Visualized tracheal air column is within normal limits. IMPRESSION: 1. Increased left lung base atelectasis and pulmonary vascular congestion since 02/16/2018. 2. Continued dense right lower lung opacification due to lung mass and drowned lung. Electronically Signed   By: Genevie Ann M.D.   On: 02/27/2018 08:31   Dg Chest Port 1 View  Result Date: 01/29/2018 CLINICAL DATA:  Shortness of breath. EXAM: PORTABLE CHEST 1 VIEW COMPARISON:  Chest CT 02/07/2018 FINDINGS: Cardiomediastinal silhouette is normal. Calcific atherosclerotic disease and tortuosity of the aorta. There is no evidence of pneumothorax. Similar appearance of right hemithorax  with airspace opacity in the right lower lobe, likely representing tumor and diffuse peribronchovascular infiltrate versus lymphangitic spread of tumor. Interval development of right pleural effusion. Chronic interstitial lung changes versus metastatic disease in the left upper lobe of the lung also stable. Osseous structures are without acute abnormality. Soft tissues are grossly normal. IMPRESSION: Similar abnormal appearance of the lungs with airspace opacity likely representing the known pulmonary mass in the right lower lobe, extensive peribronchial airspace consolidation versus lymphangitic spread of tumor. Interval enlargement of right pleural effusion. Chronic interstitial lung scarring versus metastatic disease in the left upper lobe, stable. Electronically Signed   By: Fidela Salisbury M.D.   On: 02/20/2018 15:12   Ct Angio Chest Aorta W/cm &/or Wo/cm  Result Date: 03/12/2018 CLINICAL DATA:  History of squamous carcinoma of the right lower lung, type A aortic dissection, increased shortness of breath, cough and left chest pain. EXAM: CT ANGIOGRAPHY CHEST WITH CONTRAST TECHNIQUE: Multidetector CT imaging of the chest was performed using the standard protocol during bolus administration of intravenous contrast. Multiplanar CT image reconstructions and MIPs were obtained to evaluate the vascular anatomy. CONTRAST:  166mL ISOVUE-370 IOPAMIDOL (ISOVUE-370) INJECTION 76% COMPARISON:  02/27/2018 and 02/17/2018 FINDINGS: Cardiovascular: A type A dissection of the ascending thoracic aorta is again noted which was new on the 05/30 scan. This begins just above the aortic root and can be followed into the aortic arch. Due to suboptimal opacification of the descending thoracic aorta, the distal extent of dissection cannot be adequately determined. There is again associated aneurysmal dilatation of the ascending thoracic aorta which measures 5.1 cm in maximal diameter. When remeasured on the prior study, the  aorta likely measured as much as 5 cm on the prior study. There is no evidence of mediastinal hemorrhage or findings to suggest overt aortic rupture. The study was targeted for timing of pulmonary arterial opacification. There is no evidence of pulmonary embolism. The heart is moderately enlarged. Some degree of elevated right heart pressures and right heart failure suspected based on reflux of contrast into the intrahepatic IVC and hepatic veins. Mediastinum/Nodes: No significant change  in mediastinal lymphadenopathy. Index lower right paratracheal lymph node measures approximately 2 cm. Lungs/Pleura: No change in extensive airspace consolidation and mass of the right lower lung extending to the pleura. Associated complete airway obstruction of the right lower lobe and right middle lobe. The right upper lobe shows progressive volume loss and increased apical parenchymal opacity which may be consistent with a component of acute pneumonia. The progressive volume loss is associated with attenuation of right upper lobe bronchi and there may be a component of progressive malignancy causing further airway obstruction of the right upper lobe and now postobstructive pneumonitis of the right upper lobe. The left lung shows stable consolidative opacity in the anterior left upper lobe. There is a stable small left pleural effusion. Upper Abdomen: No acute abnormality. Musculoskeletal: No chest wall abnormality. No acute or significant osseous findings. Review of the MIP images confirms the above findings. IMPRESSION: 1. 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 No evidence of mediastinal hemorrhage. Distal extent of the aortic dissection is not well delineated due to suboptimal opacification of the aorta. 2. No evidence of acute pulmonary embolism. 3. Evidence of right heart failure. 4. No change in extensive tumor of the right lower lobe and middle lobe with  complete airway obstruction. 5. Some progressive opacity in the right upper lobe is suspicious for acute pneumonia. Right upper lobe bronchi appear more attenuated and further extension of tumor causing postobstructive pneumonitis may also be a cause of increased right upper lobe airspace disease. 6. Stable consolidative changes in the anterior left upper lobe and small left pleural effusion. Electronically Signed   By: Aletta Edouard M.D.   On: 03/12/2018 08:48   Ir Fluoro Guide Port Insertion Right  Result Date: 03/05/2018 CLINICAL DATA:  Metastatic lung cancer EXAM: RIGHT INTERNAL JUGULAR SINGLE LUMEN POWER PORT CATHETER INSERTION Date:  03/05/2018 03/05/2018 4:14 pm Radiologist:  Jerilynn Mages. Daryll Brod, MD Guidance:  Ultrasound fluoroscopic MEDICATIONS: Clindamycin 600 mg IV; The antibiotic was administered within an appropriate time interval prior to skin puncture. ANESTHESIA/SEDATION: Versed 1.5 mg IV; Fentanyl 75 mcg IV; Moderate Sedation Time:  31 minutes The patient was continuously monitored during the procedure by the interventional radiology nurse under my direct supervision. FLUOROSCOPY TIME:  30 seconds (10 mGy) COMPLICATIONS: None immediate. CONTRAST:  None. PROCEDURE: Informed consent was obtained from the patient following explanation of the procedure, risks, benefits and alternatives. The patient understands, agrees and consents for the procedure. All questions were addressed. A time out was performed. Maximal barrier sterile technique utilized including caps, mask, sterile gowns, sterile gloves, large sterile drape, hand hygiene, and 2% chlorhexidine scrub. Under sterile conditions and local anesthesia, right internal jugular micropuncture venous access was performed. Access was performed with ultrasound. Images were obtained for documentation of the patent right internal jugular vein. A guide wire was inserted followed by a transitional dilator. This allowed insertion of a guide wire and catheter into  the IVC. Measurements were obtained from the SVC / RA junction back to the right IJ venotomy site. In the right infraclavicular chest, a subcutaneous pocket was created over the second anterior rib. This was done under sterile conditions and local anesthesia. 1% lidocaine with epinephrine was utilized for this. A 2.5 cm incision was made in the skin. Blunt dissection was performed to create a subcutaneous pocket over the right pectoralis major muscle. The pocket was flushed with saline vigorously. There was adequate hemostasis. The port catheter was assembled and  checked for leakage. The port catheter was secured in the pocket with two retention sutures. The tubing was tunneled subcutaneously to the right venotomy site and inserted into the SVC/RA junction through a valved peel-away sheath. Position was confirmed with fluoroscopy. Images were obtained for documentation. The patient tolerated the procedure well. No immediate complications. Incisions were closed in a two layer fashion with 4 - 0 Vicryl suture. Dermabond was applied to the skin. The port catheter was accessed, blood was aspirated followed by saline and heparin flushes. Needle was removed. A dry sterile dressing was applied. IMPRESSION: Ultrasound and fluoroscopically guided right internal jugular single lumen power port catheter insertion. Tip in the SVC/RA junction. Catheter ready for use. Electronically Signed   By: Jerilynn Mages.  Shick M.D.   On: 03/05/2018 16:23    Microbiology: Recent Results (from the past 240 hour(s))  Culture, blood (routine x 2)     Status: None (Preliminary result)   Collection Time: 03/12/18  7:50 AM  Result Value Ref Range Status   Specimen Description   Final    BLOOD LEFT HAND Performed at [facility] [address], [place], [place] [address]    Special Requests   Final    BOTTLES DRAWN AEROBIC ONLY Blood Culture results may not be optimal due to an inadequate volume of blood received in culture  bottles Performed at [facility] [address] [place], [place] [address]    Culture   Final    NO GROWTH 4 DAYS Performed at [facility], [place] [address] [place], [place] [address]    Report Status PENDING  Incomplete  Culture, blood (routine x 2)     Status: None (Preliminary result)   Collection Time: 03/12/18  7:50 AM  Result Value Ref Range Status   Specimen Description   Final    BLOOD RIGHT HAND Performed at [facility] [address], [place], [place] [address]    Special Requests   Final    BOTTLES DRAWN AEROBIC ONLY Blood Culture results may not be optimal due to an inadequate volume of blood received in culture bottles Performed at [facility] [address] [place], [place] [address]    Culture   Final    NO GROWTH 4 DAYS Performed at [facility], [place] [address] [place], [place] [address]    Report Status PENDING  Incomplete  MRSA PCR Screening     Status: None   Collection Time: 03/12/18  6:21 PM  Result Value Ref Range Status   MRSA by PCR NEGATIVE NEGATIVE Final    Comment:        The GeneXpert MRSA Assay (FDA approved for NASAL specimens only), is one component of a comprehensive MRSA colonization surveillance program. It is not intended to diagnose MRSA infection nor to guide or monitor treatment for MRSA infections. Performed at [facility], [place] [address], [place], [place] [address]      Labs: Basic Metabolic Panel: Recent Labs  Lab 03/10/18 0417 03/11/18 0456 03/12/18 0610 03/14/18 0438 03/15/18 0348 2018/03/19 0832  NA 135 136 136 129* 133* 137  K 3.9 4.0 4.3 5.1 4.6 5.8*  CL 87* 86* 87* 85* 88* 92*  CO2 41* 40* 38* 33* 37* 38*  GLUCOSE 90 110* 119* 144* 152* 106*  BUN 12 11 12  29* 30* 42*  CREATININE 0.61 0.54 0.67 1.21* 1.16* 1.69*  CALCIUM 7.8* 8.1* 8.4* 8.2* 8.6* 8.7*  MG 1.5* 1.9  --   --   --   --    Liver Function Tests: Recent  Labs  Lab 03/11/18 0456 03/14/18 0438  AST 13* 15  ALT 14 18  ALKPHOS 67 74  BILITOT 0.6 0.5  PROT 5.8* 5.7*  ALBUMIN 2.5* 2.4*   No results for input(s): LIPASE, AMYLASE in the last 168 hours. No results for input(s): AMMONIA in the last 168 hours. CBC: Recent Labs  Lab 03/12/18 0610 03/13/18 0848 03/14/18 0438 03/15/18 0348 2018/04/07 0832  WBC 19.2* 17.1* 13.8* 8.9 30.8*  NEUTROABS  --   --  13.4*  --   --   HGB 9.5* 8.6* 8.5* 9.8* 11.2*  HCT 30.8* 28.5* 27.8* 32.1* 38.4  MCV 97.8 99.0 97.9 95.3 101.6*  PLT 287 271 253 248 355   Cardiac Enzymes: Recent Labs  Lab 03/12/18 0610 03/12/18 1251 03/12/18 1755  TROPONINI 0.03* 0.03* 0.03*   D-Dimer No results for input(s): DDIMER in the last 72 hours. BNP: Invalid input(s): POCBNP CBG: Recent Labs  Lab 03/15/18 2012  GLUCAP 296*   Anemia work up No results for input(s): VITAMINB12, FOLATE, FERRITIN, TIBC, IRON, RETICCTPCT in the last 72 hours. Urinalysis    Component Value Date/Time   COLORURINE YELLOW 02/26/2018 1627   APPEARANCEUR CLEAR 02/26/2018 1627   LABSPEC 1.018 02/26/2018 1627   PHURINE 5.0 02/26/2018 1627   GLUCOSEU NEGATIVE 02/26/2018 1627   HGBUR NEGATIVE 02/26/2018 1627   BILIRUBINUR NEGATIVE 02/26/2018 1627   KETONESUR NEGATIVE 02/26/2018 1627   PROTEINUR NEGATIVE 02/26/2018 1627   NITRITE NEGATIVE 02/26/2018 1627   LEUKOCYTESUR NEGATIVE 02/26/2018 1627   Sepsis Labs Invalid input(s): PROCALCITONIN,  WBC,  LACTICIDVEN     SIGNED:  Kayleen Memos, MD  [facility] 07-Apr-2018, 3:55 PM Pager   If 7PM-7AM, please contact night-coverage [email or web address] Password [email or web address]

## 2018-03-31 NOTE — Progress Notes (Signed)
Pt's sons, nieces and other family members were bedside when I arrived. They were tearful, but hopeful in their faith while, as they said, trusting God. Pt's family wanted prayer and we joined hands bedside of pt for prayer. They were very appreciative and shared family dynamics and closeness w/pt. They are waiting for another family member from the [facility]. Please page when additional support is needed. [place], MDiv   March 19, 2018 1000  Clinical Encounter Type  Visited With Family

## 2018-03-31 DEATH — deceased

## 2018-04-02 ENCOUNTER — Other Ambulatory Visit: Payer: Medicare Other

## 2018-04-02 ENCOUNTER — Ambulatory Visit: Payer: Medicare Other

## 2018-04-23 ENCOUNTER — Ambulatory Visit: Payer: Medicare Other | Admitting: Hematology & Oncology

## 2018-04-23 ENCOUNTER — Other Ambulatory Visit: Payer: Medicare Other

## 2018-04-23 ENCOUNTER — Ambulatory Visit: Payer: Medicare Other
